# Patient Record
Sex: Female | Born: 1965
Health system: Southern US, Community
[De-identification: ages and names within clinical notes are randomized; demographics above are authoritative.]

## PROBLEM LIST (undated history)

## (undated) DIAGNOSIS — T8859XA Other complications of anesthesia, initial encounter: Secondary | ICD-10-CM

## (undated) DIAGNOSIS — G8921 Chronic pain due to trauma: Secondary | ICD-10-CM

## (undated) DIAGNOSIS — T4145XA Adverse effect of unspecified anesthetic, initial encounter: Secondary | ICD-10-CM

## (undated) DIAGNOSIS — R7303 Prediabetes: Secondary | ICD-10-CM

## (undated) DIAGNOSIS — I10 Essential (primary) hypertension: Secondary | ICD-10-CM

## (undated) DIAGNOSIS — R1013 Epigastric pain: Secondary | ICD-10-CM

## (undated) DIAGNOSIS — F5104 Psychophysiologic insomnia: Secondary | ICD-10-CM

## (undated) DIAGNOSIS — R112 Nausea with vomiting, unspecified: Secondary | ICD-10-CM

## (undated) DIAGNOSIS — G4730 Sleep apnea, unspecified: Secondary | ICD-10-CM

## (undated) DIAGNOSIS — E039 Hypothyroidism, unspecified: Secondary | ICD-10-CM

## (undated) DIAGNOSIS — N3000 Acute cystitis without hematuria: Secondary | ICD-10-CM

## (undated) DIAGNOSIS — Z87442 Personal history of urinary calculi: Secondary | ICD-10-CM

## (undated) DIAGNOSIS — J45909 Unspecified asthma, uncomplicated: Secondary | ICD-10-CM

## (undated) DIAGNOSIS — S0990XA Unspecified injury of head, initial encounter: Secondary | ICD-10-CM

## (undated) DIAGNOSIS — Z9889 Other specified postprocedural states: Secondary | ICD-10-CM

## (undated) DIAGNOSIS — Z8041 Family history of malignant neoplasm of ovary: Secondary | ICD-10-CM

## (undated) HISTORY — DX: Epigastric pain: R10.13

## (undated) HISTORY — PX: TONSILLECTOMY: SUR1361

## (undated) HISTORY — DX: Unspecified injury of head, initial encounter: S09.90XA

## (undated) HISTORY — PX: EYE SURGERY: SHX253

## (undated) HISTORY — DX: Chronic pain due to trauma: G89.21

## (undated) HISTORY — DX: Acute cystitis without hematuria: N30.00

## (undated) HISTORY — PX: INCONTINENCE SURGERY: SHX676

## (undated) HISTORY — PX: PILONIDAL CYST EXCISION: SHX744

## (undated) HISTORY — PX: WISDOM TOOTH EXTRACTION: SHX21

## (undated) HISTORY — PX: OTHER SURGICAL HISTORY: SHX169

## (undated) HISTORY — DX: Unspecified asthma, uncomplicated: J45.909

## (undated) HISTORY — PX: APPENDECTOMY: SHX54

## (undated) HISTORY — DX: Psychophysiologic insomnia: F51.04

## (undated) HISTORY — DX: Family history of malignant neoplasm of ovary: Z80.41

## (undated) HISTORY — PX: PATELLA FRACTURE SURGERY: SHX735

---

## 1997-09-20 ENCOUNTER — Emergency Department (HOSPITAL_COMMUNITY): Admission: EM | Admit: 1997-09-20 | Discharge: 1997-09-20 | Payer: Self-pay

## 1997-09-22 ENCOUNTER — Emergency Department (HOSPITAL_COMMUNITY): Admission: EM | Admit: 1997-09-22 | Discharge: 1997-09-22 | Payer: Self-pay | Admitting: Emergency Medicine

## 1998-07-06 ENCOUNTER — Other Ambulatory Visit: Admission: RE | Admit: 1998-07-06 | Discharge: 1998-07-06 | Payer: Self-pay | Admitting: Obstetrics and Gynecology

## 2001-02-07 DIAGNOSIS — G8921 Chronic pain due to trauma: Secondary | ICD-10-CM

## 2001-02-07 HISTORY — DX: Chronic pain due to trauma: G89.21

## 2001-02-24 ENCOUNTER — Encounter: Payer: Self-pay | Admitting: Emergency Medicine

## 2001-02-24 ENCOUNTER — Emergency Department (HOSPITAL_COMMUNITY): Admission: EM | Admit: 2001-02-24 | Discharge: 2001-02-24 | Payer: Self-pay | Admitting: Emergency Medicine

## 2001-02-24 DIAGNOSIS — S0990XA Unspecified injury of head, initial encounter: Secondary | ICD-10-CM

## 2001-02-24 HISTORY — DX: Unspecified injury of head, initial encounter: S09.90XA

## 2001-08-25 ENCOUNTER — Emergency Department (HOSPITAL_COMMUNITY): Admission: EM | Admit: 2001-08-25 | Discharge: 2001-08-26 | Payer: Self-pay | Admitting: Emergency Medicine

## 2001-09-09 ENCOUNTER — Other Ambulatory Visit: Admission: RE | Admit: 2001-09-09 | Discharge: 2001-09-09 | Payer: Self-pay | Admitting: Obstetrics and Gynecology

## 2001-10-27 ENCOUNTER — Emergency Department (HOSPITAL_COMMUNITY): Admission: EM | Admit: 2001-10-27 | Discharge: 2001-10-27 | Payer: Self-pay | Admitting: Emergency Medicine

## 2001-10-27 ENCOUNTER — Encounter: Payer: Self-pay | Admitting: Emergency Medicine

## 2002-02-22 ENCOUNTER — Emergency Department (HOSPITAL_COMMUNITY): Admission: EM | Admit: 2002-02-22 | Discharge: 2002-02-22 | Payer: Self-pay | Admitting: Emergency Medicine

## 2002-02-22 ENCOUNTER — Encounter: Payer: Self-pay | Admitting: Emergency Medicine

## 2003-07-19 ENCOUNTER — Emergency Department (HOSPITAL_COMMUNITY): Admission: EM | Admit: 2003-07-19 | Discharge: 2003-07-19 | Payer: Self-pay | Admitting: Emergency Medicine

## 2004-04-28 ENCOUNTER — Inpatient Hospital Stay (HOSPITAL_COMMUNITY): Admission: EM | Admit: 2004-04-28 | Discharge: 2004-05-04 | Payer: Self-pay | Admitting: Emergency Medicine

## 2004-05-26 ENCOUNTER — Ambulatory Visit: Payer: Self-pay | Admitting: Internal Medicine

## 2004-05-30 ENCOUNTER — Ambulatory Visit: Payer: Self-pay | Admitting: Internal Medicine

## 2004-06-19 ENCOUNTER — Ambulatory Visit: Payer: Self-pay | Admitting: Internal Medicine

## 2004-06-19 ENCOUNTER — Ambulatory Visit (HOSPITAL_BASED_OUTPATIENT_CLINIC_OR_DEPARTMENT_OTHER): Admission: RE | Admit: 2004-06-19 | Discharge: 2004-06-19 | Payer: Self-pay

## 2005-03-20 ENCOUNTER — Ambulatory Visit: Payer: Self-pay | Admitting: Internal Medicine

## 2005-04-12 ENCOUNTER — Ambulatory Visit: Payer: Self-pay | Admitting: Internal Medicine

## 2005-08-01 ENCOUNTER — Ambulatory Visit: Payer: Self-pay | Admitting: Internal Medicine

## 2005-08-08 ENCOUNTER — Ambulatory Visit: Payer: Self-pay | Admitting: Internal Medicine

## 2005-08-24 ENCOUNTER — Ambulatory Visit: Payer: Self-pay | Admitting: Internal Medicine

## 2005-12-19 ENCOUNTER — Ambulatory Visit: Payer: Self-pay | Admitting: Internal Medicine

## 2006-03-26 ENCOUNTER — Ambulatory Visit: Payer: Self-pay | Admitting: Internal Medicine

## 2006-04-15 ENCOUNTER — Ambulatory Visit: Payer: Self-pay | Admitting: Internal Medicine

## 2006-05-08 ENCOUNTER — Ambulatory Visit (HOSPITAL_COMMUNITY): Admission: RE | Admit: 2006-05-08 | Discharge: 2006-05-08 | Payer: Self-pay | Admitting: Obstetrics and Gynecology

## 2006-06-13 ENCOUNTER — Ambulatory Visit (HOSPITAL_COMMUNITY): Admission: RE | Admit: 2006-06-13 | Discharge: 2006-06-13 | Payer: Self-pay | Admitting: Obstetrics and Gynecology

## 2006-07-13 ENCOUNTER — Ambulatory Visit: Payer: Self-pay | Admitting: Internal Medicine

## 2006-11-01 DIAGNOSIS — S0990XA Unspecified injury of head, initial encounter: Secondary | ICD-10-CM | POA: Insufficient documentation

## 2006-11-01 DIAGNOSIS — J309 Allergic rhinitis, unspecified: Secondary | ICD-10-CM | POA: Insufficient documentation

## 2006-11-01 DIAGNOSIS — R1013 Epigastric pain: Secondary | ICD-10-CM

## 2006-11-01 DIAGNOSIS — K3189 Other diseases of stomach and duodenum: Secondary | ICD-10-CM | POA: Insufficient documentation

## 2006-11-01 DIAGNOSIS — G4700 Insomnia, unspecified: Secondary | ICD-10-CM | POA: Insufficient documentation

## 2006-11-12 ENCOUNTER — Ambulatory Visit: Payer: Self-pay | Admitting: Internal Medicine

## 2006-11-14 ENCOUNTER — Ambulatory Visit: Payer: Self-pay | Admitting: Internal Medicine

## 2006-11-18 ENCOUNTER — Ambulatory Visit: Payer: Self-pay | Admitting: Internal Medicine

## 2006-11-21 ENCOUNTER — Ambulatory Visit: Payer: Self-pay | Admitting: Internal Medicine

## 2006-11-29 ENCOUNTER — Ambulatory Visit: Payer: Self-pay | Admitting: Internal Medicine

## 2006-12-26 ENCOUNTER — Ambulatory Visit: Payer: Self-pay | Admitting: Internal Medicine

## 2006-12-31 ENCOUNTER — Encounter: Payer: Self-pay | Admitting: Internal Medicine

## 2006-12-31 ENCOUNTER — Ambulatory Visit: Payer: Self-pay | Admitting: Internal Medicine

## 2006-12-31 LAB — CONVERTED CEMR LAB
Basophils Relative: 0.5 % (ref 0.0–1.0)
Hemoglobin: 12.9 g/dL (ref 12.0–15.0)
Monocytes Relative: 6.7 % (ref 3.0–11.0)
Platelets: 366 10*3/uL (ref 150–400)
RBC: 4.3 M/uL (ref 3.87–5.11)
RDW: 12.5 % (ref 11.5–14.6)

## 2007-01-17 ENCOUNTER — Ambulatory Visit: Payer: Self-pay | Admitting: Internal Medicine

## 2007-01-17 LAB — CONVERTED CEMR LAB
Basophils Relative: 0.9 % (ref 0.0–1.0)
Eosinophils Absolute: 0.2 10*3/uL (ref 0.0–0.6)
Eosinophils Relative: 2.2 % (ref 0.0–5.0)
HCT: 36 % (ref 36.0–46.0)
Hemoglobin: 12.7 g/dL (ref 12.0–15.0)
Lymphocytes Relative: 20.1 % (ref 12.0–46.0)
MCV: 85.2 fL (ref 78.0–100.0)
Neutro Abs: 7.9 10*3/uL — ABNORMAL HIGH (ref 1.4–7.7)
Neutrophils Relative %: 71.9 % (ref 43.0–77.0)
WBC: 10.9 10*3/uL — ABNORMAL HIGH (ref 4.5–10.5)

## 2007-02-21 ENCOUNTER — Ambulatory Visit: Payer: Self-pay | Admitting: Internal Medicine

## 2007-02-21 ENCOUNTER — Telehealth: Payer: Self-pay | Admitting: Internal Medicine

## 2007-02-21 DIAGNOSIS — N3000 Acute cystitis without hematuria: Secondary | ICD-10-CM | POA: Insufficient documentation

## 2007-02-24 ENCOUNTER — Telehealth: Payer: Self-pay | Admitting: Internal Medicine

## 2007-03-15 DIAGNOSIS — J45909 Unspecified asthma, uncomplicated: Secondary | ICD-10-CM | POA: Insufficient documentation

## 2007-03-15 DIAGNOSIS — Z9089 Acquired absence of other organs: Secondary | ICD-10-CM | POA: Insufficient documentation

## 2007-03-15 DIAGNOSIS — G8921 Chronic pain due to trauma: Secondary | ICD-10-CM | POA: Insufficient documentation

## 2007-07-17 ENCOUNTER — Ambulatory Visit: Payer: Self-pay | Admitting: Internal Medicine

## 2007-07-17 DIAGNOSIS — L02818 Cutaneous abscess of other sites: Secondary | ICD-10-CM | POA: Insufficient documentation

## 2007-07-17 DIAGNOSIS — L03818 Cellulitis of other sites: Secondary | ICD-10-CM

## 2007-07-17 DIAGNOSIS — J069 Acute upper respiratory infection, unspecified: Secondary | ICD-10-CM | POA: Insufficient documentation

## 2007-11-17 ENCOUNTER — Ambulatory Visit: Payer: Self-pay | Admitting: Internal Medicine

## 2008-08-17 ENCOUNTER — Telehealth (INDEPENDENT_AMBULATORY_CARE_PROVIDER_SITE_OTHER): Payer: Self-pay | Admitting: *Deleted

## 2008-08-18 ENCOUNTER — Ambulatory Visit: Payer: Self-pay | Admitting: Internal Medicine

## 2008-08-18 DIAGNOSIS — J019 Acute sinusitis, unspecified: Secondary | ICD-10-CM | POA: Insufficient documentation

## 2009-02-25 ENCOUNTER — Ambulatory Visit: Payer: Self-pay | Admitting: Internal Medicine

## 2009-02-25 DIAGNOSIS — R062 Wheezing: Secondary | ICD-10-CM | POA: Insufficient documentation

## 2009-03-10 ENCOUNTER — Ambulatory Visit: Payer: Self-pay | Admitting: Internal Medicine

## 2009-03-10 LAB — CONVERTED CEMR LAB
Blood in Urine, dipstick: NEGATIVE
Free T4: 0.9 ng/dL (ref 0.6–1.6)
Ketones, urine, test strip: NEGATIVE
Protein, U semiquant: NEGATIVE
Specific Gravity, Urine: 1.015
Urobilinogen, UA: 0.2

## 2009-03-12 ENCOUNTER — Encounter: Payer: Self-pay | Admitting: Internal Medicine

## 2009-03-15 ENCOUNTER — Ambulatory Visit: Payer: Self-pay | Admitting: Internal Medicine

## 2009-06-14 ENCOUNTER — Ambulatory Visit: Payer: Self-pay | Admitting: Internal Medicine

## 2009-06-14 DIAGNOSIS — A09 Infectious gastroenteritis and colitis, unspecified: Secondary | ICD-10-CM | POA: Insufficient documentation

## 2009-06-14 DIAGNOSIS — R1084 Generalized abdominal pain: Secondary | ICD-10-CM | POA: Insufficient documentation

## 2009-06-17 ENCOUNTER — Encounter: Payer: Self-pay | Admitting: Internal Medicine

## 2009-06-18 ENCOUNTER — Encounter: Payer: Self-pay | Admitting: Internal Medicine

## 2009-06-21 LAB — CONVERTED CEMR LAB
ALT: 42 units/L — ABNORMAL HIGH (ref 0–35)
Albumin: 3.6 g/dL (ref 3.5–5.2)
BUN: 7 mg/dL (ref 6–23)
Basophils Relative: 0.9 % (ref 0.0–3.0)
Bilirubin Urine: NEGATIVE
Bilirubin, Direct: 0.1 mg/dL (ref 0.0–0.3)
CO2: 28 meq/L (ref 19–32)
Chloride: 105 meq/L (ref 96–112)
Creatinine, Ser: 0.5 mg/dL (ref 0.4–1.2)
Eosinophils Absolute: 0.2 10*3/uL (ref 0.0–0.7)
Eosinophils Relative: 2.9 % (ref 0.0–5.0)
HCT: 36.8 % (ref 36.0–46.0)
Lipase: 16 units/L (ref 11.0–59.0)
Lymphs Abs: 2.4 10*3/uL (ref 0.7–4.0)
MCHC: 33.2 g/dL (ref 30.0–36.0)
MCV: 87.9 fL (ref 78.0–100.0)
Monocytes Absolute: 0.5 10*3/uL (ref 0.1–1.0)
Neutro Abs: 4.3 10*3/uL (ref 1.4–7.7)
Neutrophils Relative %: 57.8 % (ref 43.0–77.0)
Nitrite: POSITIVE
Potassium: 4 meq/L (ref 3.5–5.1)
RBC: 4.18 M/uL (ref 3.87–5.11)
Total Protein, Urine: 30 mg/dL
Total Protein: 7.3 g/dL (ref 6.0–8.3)
Urobilinogen, UA: 0.2 (ref 0.0–1.0)
WBC: 7.5 10*3/uL (ref 4.5–10.5)

## 2009-07-25 ENCOUNTER — Ambulatory Visit: Payer: Self-pay | Admitting: Internal Medicine

## 2009-07-25 LAB — CONVERTED CEMR LAB
Bilirubin Urine: NEGATIVE
Ketones, ur: NEGATIVE mg/dL
Leukocytes, UA: NEGATIVE
Specific Gravity, Urine: 1.015 (ref 1.000–1.030)
Total Protein, Urine: NEGATIVE mg/dL
pH: 6 (ref 5.0–8.0)

## 2009-07-27 ENCOUNTER — Telehealth: Payer: Self-pay | Admitting: Internal Medicine

## 2010-02-01 ENCOUNTER — Ambulatory Visit: Payer: Self-pay | Admitting: Internal Medicine

## 2010-02-01 DIAGNOSIS — T148XXA Other injury of unspecified body region, initial encounter: Secondary | ICD-10-CM | POA: Insufficient documentation

## 2010-02-01 LAB — CONVERTED CEMR LAB
Basophils Absolute: 0.1 10*3/uL (ref 0.0–0.1)
Eosinophils Relative: 3.1 % (ref 0.0–5.0)
Hemoglobin: 12.8 g/dL (ref 12.0–15.0)
Lymphocytes Relative: 28.2 % (ref 12.0–46.0)
Monocytes Relative: 6.7 % (ref 3.0–12.0)
Neutro Abs: 6.5 10*3/uL (ref 1.4–7.7)
Platelets: 350 10*3/uL (ref 150.0–400.0)
RDW: 13.1 % (ref 11.5–14.6)
WBC: 10.7 10*3/uL — ABNORMAL HIGH (ref 4.5–10.5)

## 2010-02-16 ENCOUNTER — Ambulatory Visit: Payer: Self-pay | Admitting: Internal Medicine

## 2010-02-22 ENCOUNTER — Telehealth: Payer: Self-pay | Admitting: Internal Medicine

## 2010-02-22 DIAGNOSIS — R05 Cough: Secondary | ICD-10-CM

## 2010-02-22 DIAGNOSIS — R059 Cough, unspecified: Secondary | ICD-10-CM | POA: Insufficient documentation

## 2010-02-23 ENCOUNTER — Ambulatory Visit: Payer: Self-pay | Admitting: Internal Medicine

## 2010-02-23 LAB — CONVERTED CEMR LAB
Basophils Relative: 0.8 % (ref 0.0–3.0)
Eosinophils Relative: 3.8 % (ref 0.0–5.0)
HCT: 34.7 % — ABNORMAL LOW (ref 36.0–46.0)
Hemoglobin: 12 g/dL (ref 12.0–15.0)
Lymphs Abs: 3.2 10*3/uL (ref 0.7–4.0)
MCV: 87.6 fL (ref 78.0–100.0)
Monocytes Relative: 5.3 % (ref 3.0–12.0)
Neutro Abs: 5.9 10*3/uL (ref 1.4–7.7)
WBC: 10.1 10*3/uL (ref 4.5–10.5)

## 2010-04-30 ENCOUNTER — Encounter: Payer: Self-pay | Admitting: Surgery

## 2010-05-01 ENCOUNTER — Telehealth: Payer: Self-pay | Admitting: Internal Medicine

## 2010-05-03 ENCOUNTER — Ambulatory Visit
Admission: RE | Admit: 2010-05-03 | Discharge: 2010-05-03 | Payer: Self-pay | Source: Home / Self Care | Attending: Internal Medicine | Admitting: Internal Medicine

## 2010-05-03 ENCOUNTER — Telehealth (INDEPENDENT_AMBULATORY_CARE_PROVIDER_SITE_OTHER): Payer: Self-pay | Admitting: *Deleted

## 2010-05-03 LAB — CONVERTED CEMR LAB
Blood in Urine, dipstick: NEGATIVE
Glucose, Urine, Semiquant: NEGATIVE
Ketones, urine, test strip: NEGATIVE
Nitrite: NEGATIVE
Protein, U semiquant: NEGATIVE
WBC Urine, dipstick: NEGATIVE

## 2010-05-05 ENCOUNTER — Ambulatory Visit: Payer: Self-pay | Admitting: Internal Medicine

## 2010-05-09 NOTE — Assessment & Plan Note (Signed)
Summary: DR MEN PT--GAS PAIN-DIARRHEA  STC   Vital Signs:  Patient profile:   45 year old female Height:      68 inches Weight:      347 pounds BMI:     52.95 O2 Sat:      99 % on Room air Temp:     97.5 degrees F oral Pulse rate:   75 / minute BP sitting:   110 / 80  (left arm) Cuff size:   large  Vitals Entered ByZella Ball Ewing (June 14, 2009 3:03 PM)  O2 Flow:  Room air CC: Diarrhea, nausea, refills/RE   Primary Care Provider:  Norins  CC:  Diarrhea, nausea, and refills/RE.  History of Present Illness: here after 10 days onset n/v with watery nonbloody diarrhea  - felt terrible for several days at onset, did ok with phenergan the first 2 days with nausea and immodium for diarrhea, tylenol for aches, then felt improved,  but then 3 to 4 days ago seemed to start all over again with numerous episodes vomiting despite phenergan 50 mg and thinks she kep the pill down;  had fever, sweats, clammy, chills , watery diarrhea but very mild abd pains at worst;  the second "episode" was much worse , took a friend's zofran for the weekend of the second episode; had marked gas from below and some mild stool incontinence ; today seems still abd bloating adn discomfort but no further vomiting, trying gas-x and immodium with less stool incontinence;  also incidently it seems with 3 to 4 days sinus drainage now turning colored greenish  with increasd pain ;  and on top of all that menses started 3 days ago;  husband not ill and stays in the same room;  Pt denies CP, sob, doe, wheezing, orthopnea, pnd, worsening LE edema, palps, dizziness or syncope no rash , joint pains, but did have myalgas.    Problems Prior to Update: 1)  Infectious Diarrhea  (ICD-009.2) 2)  Abdominal Pain, Generalized  (ICD-789.07) 3)  Sinusitis- Acute-nos  (ICD-461.9) 4)  Wheezing  (ICD-786.07) 5)  Sinusitis- Acute-nos  (ICD-461.9) 6)  Cellulitis/abscess, Site Nec  (ICD-682.8) 7)  Uri  (ICD-465.9) 8)  Repair of Torn Ligament  Right Leg.  () 9)  Repair of Toe Laceration  () 10)  Hx of Fracture, Patella Right  (ICD-822.0) 11)  Hx of Fracture, Foot Right  (ICD-825.20) 12)  Appendectomy, Hx of  (ICD-V45.79) 13)  Tonsillectomy, Hx of  (ICD-V45.79) 14)  Hx of Chronic Pain Due To Trauma  (ICD-338.21) 15)  Asthma, Childhood  (ICD-493.00) 16)  Acute Cystitis  (ICD-595.0) 17)  Head Trauma, Closed  (ICD-959.01) 18)  Insomnia, Chronic  (ICD-307.42) 19)  Dyspepsia, Chronic  (ICD-536.8) 20)  Allergic Rhinitis  (ICD-477.9)  Medications Prior to Update: 1)  Diazepam 10 Mg  Tabs (Diazepam) .... As Needed 2)  Nexium 40 Mg  Cpdr (Esomeprazole Magnesium) .... Once Daily As Needed 3)  Meclizine Hcl 25 Mg  Tabs (Meclizine Hcl) .... Q 6 As Needed 4)  Promethazine Hcl 25 Mg  Tabs (Promethazine Hcl) .... Q 6 As Needed 5)  Sudafed 30 Mg  Tabs (Pseudoephedrine Hcl) .... As Directed As Needed 6)  Ibuprofen 200 Mg  Caps (Ibuprofen) .... As Directed As Needed 7)  Furosemide 20 Mg  Tabs (Furosemide) .Marland Kitchen.. 1 By Mouth Once Daily As Needed Fluid Retention. 8)  Lidoderm 5 % Ptch (Lidocaine) .... Prn 9)  Topamax 25 Mg Tabs (Topiramate) .... Ptn 10)  Advair Diskus 250-50 Mcg/dose Aepb (Fluticasone-Salmeterol) .Marland Kitchen.. 1 Puff Once Daily 11)  Proventil Hfa 108 (90 Base) Mcg/act Aers (Albuterol Sulfate) .... 2 Puffs Four Times Per Day As Needed 12)  Nasonex 50 Mcg/act Susp (Mometasone Furoate) .... 2 Sprays To Each Nostril Once A Day. 13)  Fluconazole 150 Mg Tabs (Fluconazole) .Marland Kitchen.. 1 By Mouth X 1 For Yeast Infection Post Antibiotics  Current Medications (verified): 1)  Diazepam 10 Mg  Tabs (Diazepam) .... As Needed 2)  Nexium 40 Mg  Cpdr (Esomeprazole Magnesium) .... Once Daily As Needed 3)  Meclizine Hcl 25 Mg  Tabs (Meclizine Hcl) .... Q 6 As Needed 4)  Promethazine Hcl 25 Mg  Tabs (Promethazine Hcl) .... Q 6 As Needed 5)  Sudafed 30 Mg  Tabs (Pseudoephedrine Hcl) .... As Directed As Needed 6)  Ibuprofen 200 Mg  Caps (Ibuprofen) .... As Directed  As Needed 7)  Furosemide 20 Mg  Tabs (Furosemide) .Marland Kitchen.. 1 By Mouth Once Daily As Needed Fluid Retention. 8)  Lidoderm 5 % Ptch (Lidocaine) .... Prn 9)  Topamax 25 Mg Tabs (Topiramate) .... Ptn 10)  Advair Diskus 250-50 Mcg/dose Aepb (Fluticasone-Salmeterol) .Marland Kitchen.. 1 Puff Once Daily 11)  Proventil Hfa 108 (90 Base) Mcg/act Aers (Albuterol Sulfate) .... 2 Puffs Four Times Per Day As Needed 12)  Nasonex 50 Mcg/act Susp (Mometasone Furoate) .... 2 Sprays To Each Nostril Once A Day. 13)  Metronidazole 500 Mg Tabs (Metronidazole) .Marland Kitchen.. 1 By Mouth Qid For 7 Days 14)  Azithromycin 250 Mg Tabs (Azithromycin) .... 2po Qd For 1 Day, Then 1po Qd For 4days, Then Stop  Allergies (verified): 1)  ! * Avelox 2)  ! Codeine 3)  ! Percocet 4)  ! Darvocet 5)  Compazine 6)  * Trileptal  Past History:  Past Medical History: Last updated: 03/15/2007 Hx of CHRONIC PAIN DUE TO TRAUMA (ICD-338.21) ASTHMA, CHILDHOOD (ICD-493.00) ACUTE CYSTITIS (ICD-595.0) HEAD TRAUMA, CLOSED (ICD-959.01) INSOMNIA, CHRONIC (ICD-307.42) DYSPEPSIA, CHRONIC (ICD-536.8) ALLERGIC RHINITIS (ICD-477.9)    Past Surgical History: Last updated: 03/15/2007 * REPAIR OF TORN LIGAMENT RIGHT LEG. 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  Risk Factors: Smoking Status: never (11/01/2006)  Review of Systems       all otherwise negative per pt -    Physical Exam  General:  alert and overweight-appearing.  ,mild ill  Head:  normocephalic and atraumatic.   Eyes:  vision grossly intact, pupils equal, and pupils round.   Ears:  bilat tm's mild red, sinus tender Nose:  nasal dischargemucosal  pallor and mucosal edema.   Mouth:  pharyngeal erythema and fair dentition.   Neck:  supple and no masses.  , no LA Lungs:  normal respiratory effort and normal breath sounds.   Heart:  normal rate and regular rhythm.   Abdomen:  soft, normal bowel sounds, no distention, no masses, and no guarding.  , but with diffuse mild tender some worse to the left upper adn lower quads, no rebound Msk:  no joint tenderness and no joint swelling.   Extremities:  no edema, no erythema  Skin:  color normal and no rashes.   Psych:  normally interactive, not depressed appearing, and severely anxious.  Impression & Recommendations:  Problem # 1:  SINUSITIS- ACUTE-NOS (ICD-461.9)  Her updated medication list for this problem includes:    Sudafed 30 Mg Tabs (Pseudoephedrine hcl) .Marland Kitchen... As directed as needed    Nasonex 50 Mcg/act Susp (Mometasone furoate) .Marland Kitchen... 2 sprays to each nostril once a day.    Metronidazole 500 Mg Tabs (Metronidazole) .Marland Kitchen... 1 by mouth qid for 7 days    Azithromycin 250 Mg Tabs (Azithromycin) .Marland Kitchen... 2po qd for 1 day, then 1po qd for 4days, then stop seems onset in last 3 to4  days, treat as above, f/u any worsening signs or symptoms   Problem # 2:  ABDOMINAL PAIN, GENERALIZED (ICD-789.07)  more left sided - I believe prob related to original illness from approx 10 days ago, ? viral but cannot r/o other colitis -to check labs  Orders: TLB-BMP (Basic Metabolic Panel-BMET) (80048-METABOL) TLB-CBC Platelet - w/Differential (85025-CBCD) TLB-Hepatic/Liver Function Pnl (80076-HEPATIC) TLB-Lipase (83690-LIPASE) TLB-Udip w/ Micro (81001-URINE) T-Culture, Urine ([address])  Problem # 3:  INFECTIOUS DIARRHEA (ICD-009.2)  presumed - likely related to above, to check labs  Orders: T-Culture, Stool ([id number]) T-Culture, C-Diff Toxin A/B (098[address]-91478) T-Stool Giardia / Crypto- EIA (29562) T-Stool for O&P (130[address]-57846)  Complete Medication List: 1)  Diazepam 10 Mg Tabs  (Diazepam) .... As needed 2)  Nexium 40 Mg Cpdr (Esomeprazole magnesium) .... Once daily as needed 3)  Meclizine Hcl 25 Mg Tabs (Meclizine hcl) .... Q 6 as needed 4)  Promethazine Hcl 25 Mg Tabs (Promethazine hcl) .... Q 6 as needed 5)  Sudafed 30 Mg Tabs (Pseudoephedrine hcl) .... As directed as needed 6)  Ibuprofen 200 Mg Caps (Ibuprofen) .... As directed as needed 7)  Furosemide 20 Mg Tabs (Furosemide) .Marland Kitchen.. 1 by mouth once daily as needed fluid retention. 8)  Lidoderm 5 % Ptch (Lidocaine) .... Prn 9)  Topamax 25 Mg Tabs (Topiramate) .... Ptn 10)  Advair Diskus 250-50 Mcg/dose Aepb (Fluticasone-salmeterol) .Marland Kitchen.. 1 puff once daily 11)  Proventil Hfa 108 (90 Base) Mcg/act Aers (Albuterol sulfate) .... 2 puffs four times per day as needed 12)  Nasonex 50 Mcg/act Susp (Mometasone furoate) .... 2 sprays to each nostril once a day. 13)  Metronidazole 500 Mg Tabs (Metronidazole) .Marland Kitchen.. 1 by mouth qid for 7 days 14)  Azithromycin 250 Mg Tabs (Azithromycin) .... 2po qd for 1 day, then 1po qd for 4days, then stop  Patient Instructions: 1)  Please take all new medications as prescribed 2)  Continue all previous medications as before this visit  3)  Please go to the Lab in the basement for your blood and/or urine tests today , and stool tests 4)  Please schedule an appointment with your primary doctor as needed Prescriptions: NASONEX 50 MCG/ACT SUSP (MOMETASONE FUROATE) 2 sprays to each nostril once a day.  #1 x 11   Entered and Authorized by:   Corwin Levins MD   Signed by:   Corwin Levins MD on 06/14/2009   Method used:   Electronically to        [facility] (retail)       [address]       [place], [place]  [address]       Ph: [phone]       Fax: [phone]   RxID:   [id number] METRONIDAZOLE 500 MG TABS (METRONIDAZOLE) 1 by mouth qid for 7 days  #28 x 0   Entered and Authorized by:  Corwin Levins MD   Signed by:   Corwin Levins MD on 06/14/2009   Method  used:   Print then Give to Patient   RxID:   [id number] AZITHROMYCIN 250 MG TABS (AZITHROMYCIN) 2po qd for 1 day, then 1po qd for 4days, then stop  #6 x 1   Entered and Authorized by:   Corwin Levins MD   Signed by:   Corwin Levins MD on 06/14/2009   Method used:   Print then Give to Patient   RxID:   [id number]

## 2010-05-09 NOTE — Progress Notes (Signed)
Summary: REQ FOR RX  Phone Note Call from Patient Call back at [facility] Phone [phone]   Summary of Call: PT is concerned that she needs another different antibiotic. She continues to c/o productive cough w/ dark yellow mucus and low grade fever. Also c/o vaginal yeast infection & white spots in her mouth.  Initial call taken by: Lamar Sprinkles, CMA,  February 22, 2010 11:09 AM  Follow-up for Phone Call        If she is failing augmentin she will need CBCD, PA&Lat CXR with ov to follow.   For vaginitis and possible thrush - fluconazole 100mg  once daily x 10 days. #10   Follow-up by: Jacques Navy MD,  February 22, 2010 1:12 PM  Additional Follow-up for Phone Call Additional follow up Details #1::        Pt informed, orders in idx & EMR, pt scheduled for tomorrow pm Additional Follow-up by: Lamar Sprinkles, CMA,  February 22, 2010 2:26 PM  New Problems: COUGH (ICD-786.2)   New Problems: COUGH (ICD-786.2) New/Updated Medications: FLUCONAZOLE 100 MG TABS (FLUCONAZOLE) 1 once daily x 10 days Prescriptions: FLUCONAZOLE 100 MG TABS (FLUCONAZOLE) 1 once daily x 10 days  #10 x 0   Entered by:   Lamar Sprinkles, CMA   Authorized by:   Jacques Navy MD   Signed by:   Lamar Sprinkles, CMA on 02/22/2010   Method used:   Electronically to        [facility] (retail)       [address]       [place], [place]  [address]       Ph: [phone]       Fax: [phone]   RxID:   [id number]

## 2010-05-09 NOTE — Progress Notes (Signed)
Summary: urine  Phone Note Outgoing Call   Reason for Call: Discuss lab or test results Summary of Call: please call pt - U/A negative Thanks Initial call taken by: Jacques Navy MD,  July 27, 2009 5:38 AM  Follow-up for Phone Call        called pt lmoam for pt to call back Follow-up by: Ami Bullins CMA,  July 27, 2009 11:08 AM  Additional Follow-up for Phone Call Additional follow up Details #1::        Called pt again no ansew LMOM (personal VM) UA was negative. Additional Follow-up by: Orlan Leavens,  July 28, 2009 3:21 PM

## 2010-05-09 NOTE — Assessment & Plan Note (Signed)
Summary: SINUS PROBLEM--STC   Vital Signs:  Patient profile:   45 year old female Height:      68 inches Weight:      338 pounds BMI:     51.58 O2 Sat:      99 % on Room air Temp:     98.4 degrees F oral Pulse rate:   92 / minute BP sitting:   128 / 86  (left arm) Cuff size:   large  Vitals Entered By: Bill Salinas CMA (February 01, 2010 9:05 AM)  O2 Flow:  Room air CC: pt here with c/o sinus', coughing, cold chills fever and allergies x 4 days/ ab   Primary Care Kalep Full:  Norins  CC:  pt here with c/o sinus', coughing, and cold chills fever and allergies x 4 days/ ab.  History of Present Illness: [name] presents today with a 6 day history of sinus congestion.  She noticed that she began to feel congested on Friday afternoon and then shorthly thereafter began to get intermittent chills and fevers.  Along with the feelings of fevers she noticed a throbbing headache in the frontal and temporal regions.  She states that this headache does not resemble the normal sinus pressure headaches that she often gets with her sinus infections - she has had 3 sinus infections this year.  The pain she feels comes and goes with the fevers and taking ibuprofen 400-600mg  will help relieve the pain until the next time she feels flush and the pain will again return.  She also has been taking a Benadryl and a decongestant to help with the congestion and runny nose every 4 hours.  She does think they have been helping "dry her out" but has noticed an increase in her blood pressure when she takes them.  She also reports intermittent bleeding from her right nose as well as blood stained mucus production when she blows her nose.  The mucus she is producting is white tinted with occasional blood.  Last night she did develop a cough with yellow/green sputum but no blood.  She also states that she has been feeling tired and having muscle soreness for the past couple days.  She also has noticed that she has been  bruising more easily than normal.        Current Medications (verified): 1)  Diazepam 10 Mg  Tabs (Diazepam) .... As Needed 2)  Nexium 40 Mg  Cpdr (Esomeprazole Magnesium) .... Once Daily As Needed 3)  Meclizine Hcl 25 Mg  Tabs (Meclizine Hcl) .... Q 6 As Needed 4)  Promethazine Hcl 25 Mg  Tabs (Promethazine Hcl) .... Q 6 As Needed 5)  Sudafed 30 Mg  Tabs (Pseudoephedrine Hcl) .... As Directed As Needed 6)  Ibuprofen 200 Mg  Caps (Ibuprofen) .... As Directed As Needed 7)  Furosemide 20 Mg  Tabs (Furosemide) .Marland Kitchen.. 1 By Mouth Once Daily As Needed Fluid Retention. 8)  Lidoderm 5 % Ptch (Lidocaine) .... Prn 9)  Topamax 25 Mg Tabs (Topiramate) .... Ptn 10)  Advair Diskus 250-50 Mcg/dose Aepb (Fluticasone-Salmeterol) .Marland Kitchen.. 1 Puff Once Daily 11)  Proventil Hfa 108 (90 Base) Mcg/act Aers (Albuterol Sulfate) .... 2 Puffs Four Times Per Day As Needed  Allergies (verified): 1)  ! * Avelox 2)  ! Codeine 3)  ! Percocet 4)  ! Darvocet 5)  Compazine 6)  * Trileptal  Past History:  Past Medical History: Last updated: 03/15/2007 Hx of CHRONIC PAIN DUE TO TRAUMA (ICD-338.21) ASTHMA, CHILDHOOD (ICD-493.00)  ACUTE CYSTITIS (ICD-595.0) HEAD TRAUMA, CLOSED (ICD-959.01) INSOMNIA, CHRONIC (ICD-307.42) DYSPEPSIA, CHRONIC (ICD-536.8) ALLERGIC RHINITIS (ICD-477.9)    Past Surgical History: Last updated: 03/15/2007 * REPAIR OF TORN LIGAMENT RIGHT LEG. 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  Risk Factors: Smoking Status: never (11/01/2006)  Review of Systems       The patient complains of anorexia, fever, dyspnea on exertion, prolonged cough, and headaches.  The patient denies weight loss, weight gain, vision loss, decreased hearing, hoarseness, chest pain, syncope, peripheral edema, hemoptysis, abdominal pain, melena, hematochezia, hematuria, and enlarged lymph nodes.    Physical Exam  General:  alert, well-developed, and well-nourished.   Head:  normocephalic and atraumatic.   Eyes:  pupils equal, pupils round, and pupils reactive to light.  C&S clear Ears:  R ear normal.  L ear had less movement than the R ear with insufflation. Nose:  no external erythema, no nasal discharge, active bleeding or clots, R frontal sinus tenderness, and R maxillary sinus tenderness.  Right frontal and maxillary sinus with decreased transillumination. Mouth:  good dentition, no exudates, and pharyngeal erythema.   Neck:  supple and no cervical lymphadenopathy.   Lungs:  normal respiratory effort, no crackles, and no wheezes.   Heart:  normal rate, regular rhythm, no murmur, no gallop, and no rub.   Msk:  Muscle soreness to palpitation. Pulses:  R radial normal, R posterior tibial normal, L radial normal, and L posterior tibial normal.   Neurologic:  alert & oriented X3 and cranial nerves II-XII intact.   Cervical Nodes:  no anterior cervical adenopathy and no posterior cervical adenopathy.     Impression & Recommendations:  Problem # 1:  SINUSITIS- ACUTE-NOS (ICD-461.9) Due to her current afebrile state and clear mucous production it appears as though she does not have a sinus infection but possibly a viral infection.  However with her tender sinuses and productive cough a bacterial infection is still a possibility and antibiotics would be a reasonable option for this patient.    Plan:  Z-Pack for the potential infection           Continue decongestant use but only 2-3 times a  day           D/C antihistamine           Continue nasal spray for lubrication of the nasal passages           Increase fluid intake   The following medications were removed from the medication list:    Fluticasone Propionate 50 Mcg/act Susp (Fluticasone propionate) .Marland Kitchen... 2 spray/side once daily Her updated medication list for this problem includes:    Sudafed 30 Mg Tabs (Pseudoephedrine hcl) .Marland Kitchen... As directed as needed    Zithromax Z-pak 250 Mg Tabs (Azithromycin) .Marland Kitchen... As directed    Fluticasone Propionate 50 Mcg/act Susp (Fluticasone propionate) .Marland Kitchen... 1 spray per nostril as needed  Orders: TLB-CBC Platelet - w/Differential (85025-CBCD)  Problem # 2:  BRUISE (ICD-924.9) The patient reported easy bruising which is new for her.  The increased  bruising does not seem to be explained by her current sinus problem and should be worked up to rule out a platelet disorder.   Plan:  CBC draw today   Complete Medication List: 1)  Diazepam 10 Mg Tabs (Diazepam) .... As needed 2)  Nexium 40 Mg Cpdr (Esomeprazole magnesium) .... Once daily as needed 3)  Meclizine Hcl 25 Mg Tabs (Meclizine hcl) .... Q 6 as needed 4)  Promethazine Hcl 25 Mg Tabs (Promethazine hcl) .... Q 6 as needed 5)  Sudafed 30 Mg Tabs (Pseudoephedrine hcl) .... As directed as needed 6)  Ibuprofen 200 Mg Caps (Ibuprofen) .... As directed as needed 7)  Furosemide 20 Mg Tabs (Furosemide) .Marland Kitchen.. 1 by mouth once daily as needed fluid retention. 8)  Lidoderm 5 % Ptch (Lidocaine) .... Prn 9)  Topamax 25 Mg Tabs (Topiramate) .... Ptn 10)  Advair Diskus 250-50 Mcg/dose Aepb (Fluticasone-salmeterol) .Marland Kitchen.. 1 puff once daily 11)  Proventil Hfa 108 (90 Base) Mcg/act Aers (Albuterol sulfate) .... 2 puffs four times per day as needed 12)  Zithromax Z-pak 250 Mg Tabs (Azithromycin) .... As directed 13)  Fluticasone Propionate 50 Mcg/act Susp (Fluticasone propionate) .Marland Kitchen.. 1 spray per nostril as needed Prescriptions: FLUTICASONE PROPIONATE 50  MCG/ACT SUSP (FLUTICASONE PROPIONATE) 1 spray per nostril as needed  #1 x 12   Entered and Authorized by:   Jacques Navy MD   Signed by:   Jacques Navy MD on 02/02/2010   Method used:   Electronically to        [facility] (retail)       [address]       [place], [place]  [address]       Ph: [phone]       Fax: [phone]   RxID:   [id number] ZITHROMAX Z-PAK 250 MG TABS (AZITHROMYCIN) as directed  #1 x 0   Entered and Authorized by:   Jacques Navy MD   Signed by:   Jacques Navy MD on 02/01/2010   Method used:   Electronically to        [facility] (retail)       [address]       [place], [place]  [address]       Ph: [phone]       Fax: [phone]   RxID:   [id number]    Orders Added: 1)  TLB-CBC Platelet - w/Differential [85025-CBCD] 2)  Est. Patient Level III [88416]

## 2010-05-09 NOTE — Assessment & Plan Note (Signed)
Summary: COUGH W/MUCUS  STC   Vital Signs:  Patient profile:   45 year old female Height:      68 inches Weight:      345 pounds BMI:     52.65 O2 Sat:      99 % on Room air Temp:     98.6 degrees F oral Pulse rate:   94 / minute BP sitting:   128 / 90  (left arm) Cuff size:   large  Vitals Entered By: Bill Salinas CMA (February 16, 2010 9:29 AM)  O2 Flow:  Room air CC: Pt was seen here on 02/01/2010 with a viral infection-clear mucous production and question of bacterial infection due to tender sinuses and productive cough, She was started on a Z-pak for these symptoms and states since her symptoms have gotten worse with green mucous production she also had c/o feeling "winded" and wheezing/ ab Comments z-pak regimen completed/ ab   Primary Care Provider:  Lin Hackmann  CC:  Pt was seen here on 02/01/2010 with a viral infection-clear mucous production and question of bacterial infection due to tender sinuses and productive cough and She was started on a Z-pak for these symptoms and states since her symptoms have gotten worse with green mucous production she also had c/o feeling "winded" and wheezing/ ab.  History of Present Illness: Patinet was seen Oct 26th for an URI and was treated with a Z-pak. She has now gotten worse with productive cough, fatigue,SOB, wheezing, fever and fatigue. She has been taking prom/cod but this is not really controlling her cough. She is taking a decongestant and the nasal saline saline spray.  Current Medications (verified): 1)  Diazepam 10 Mg  Tabs (Diazepam) .... As Needed 2)  Nexium 40 Mg  Cpdr (Esomeprazole Magnesium) .... Once Daily As Needed 3)  Meclizine Hcl 25 Mg  Tabs (Meclizine Hcl) .... Q 6 As Needed 4)  Promethazine Hcl 25 Mg  Tabs (Promethazine Hcl) .... Q 6 As Needed 5)  Sudafed 30 Mg  Tabs (Pseudoephedrine Hcl) .... As Directed As Needed 6)  Ibuprofen 200 Mg  Caps (Ibuprofen) .... As Directed As Needed 7)  Furosemide 20 Mg  Tabs (Furosemide)  .Marland Kitchen.. 1 By Mouth Once Daily As Needed Fluid Retention. 8)  Lidoderm 5 % Ptch (Lidocaine) .... Prn 9)  Topamax 25 Mg Tabs (Topiramate) .... Ptn 10)  Advair Diskus 250-50 Mcg/dose Aepb (Fluticasone-Salmeterol) .Marland Kitchen.. 1 Puff Once Daily 11)  Proventil Hfa 108 (90 Base) Mcg/act Aers (Albuterol Sulfate) .... 2 Puffs Four Times Per Day As Needed 12)  Fluticasone Propionate 50 Mcg/act Susp (Fluticasone Propionate) .Marland Kitchen.. 1 Spray Per Nostril As Needed  Allergies (verified): 1)  ! * Avelox 2)  ! Codeine 3)  ! Percocet 4)  ! Darvocet 5)  Compazine 6)  * Trileptal  Past History:  Past Medical History: Last updated: 03/15/2007 Hx of CHRONIC PAIN DUE TO TRAUMA (ICD-338.21) ASTHMA, CHILDHOOD (ICD-493.00) ACUTE CYSTITIS (ICD-595.0) HEAD TRAUMA, CLOSED (ICD-959.01) INSOMNIA, CHRONIC (ICD-307.42) DYSPEPSIA, CHRONIC (ICD-536.8) ALLERGIC RHINITIS (ICD-477.9)    Past Surgical History: Last updated: 03/15/2007 * REPAIR OF TORN LIGAMENT RIGHT LEG. * REPAIR OF TOE LACERATION Hx of FRACTURE, PATELLA RIGHT (ICD-822.0) Hx of FRACTURE, FOOT RIGHT (ICD-825.20) APPENDECTOMY, HX OF (ICD-V45.79) TONSILLECTOMY, HX OF (ICD-V45.79) FH reviewed for relevance, SH/Risk Factors reviewed for relevance  Review of Systems       The patient complains of fever, hoarseness, dyspnea on exertion, and prolonged cough.  The patient denies anorexia, weight loss, weight gain, decreased  hearing, syncope, headaches, abdominal pain, hematochezia, incontinence, suspicious skin lesions, difficulty walking, unusual weight change, abnormal bleeding, and angioedema.    Physical Exam  General:  alert, well-developed, and well-nourished.   Head:  normocephalic and atraumatic.  Tender to percussion over left frontal and masxillary sinus Eyes:  vision grossly intact, pupils equal, and pupils round.   Ears:  R ear normal and L ear normal.   Nose:  no external deformity and nose piercing noted.   Mouth:  throat clear Neck:  supple and  full ROM.  tender to palpation right neck Chest Wall:  no deformities and no tenderness.   Lungs:  normal respiratory effort, no intercostal retractions, no accessory muscle use, normal breath sounds, no crackles, and no wheezes.   Heart:  normal rate, regular rhythm, and no JVD.   Skin:  turgor normal and color normal.  No urticaria Cervical Nodes:  no anterior cervical adenopathy and no posterior cervical adenopathy.   Psych:  Oriented X3, memory intact for recent and remote, normally interactive, and good eye contact.     Impression & Recommendations:  Problem # 1:  URI (ICD-465.9) Patient with low grade fever, sinus pain and persistent cough. ON exam no wheezing.  Plan - tussionx 1 tsp q 12 (discussed with patient who has listed a percocet allergy with nausea and rash. She is willing to try this and will continue taking zyrtec. she has phenergan at home.            Augmentin 875 two times a day x 7           continue all other meds           mucinex.           Her updated medication list for this problem includes:    Promethazine Hcl 25 Mg Tabs (Promethazine hcl) ..... Q 6 as needed    Ibuprofen 200 Mg Caps (Ibuprofen) .Marland Kitchen... As directed as needed    Tussionex Pennkinetic Er 10-8 Mg/28ml Lqcr (Hydrocod polst-chlorphen polst) .Marland Kitchen... 1 tsp q 12  Complete Medication List: 1)  Diazepam 10 Mg Tabs (Diazepam) .... As needed 2)  Nexium 40 Mg Cpdr (Esomeprazole magnesium) .... Once daily as needed 3)  Meclizine Hcl 25 Mg Tabs (Meclizine hcl) .... Q 6 as needed 4)  Promethazine Hcl 25 Mg Tabs (Promethazine hcl) .... Q 6 as needed 5)  Sudafed 30 Mg Tabs (Pseudoephedrine hcl) .... As directed as needed 6)  Ibuprofen 200 Mg Caps (Ibuprofen) .... As directed as needed 7)  Furosemide 20 Mg Tabs (Furosemide) .Marland Kitchen.. 1 by mouth once daily as needed fluid retention. 8)  Lidoderm 5 % Ptch (Lidocaine) .... Prn 9)  Topamax 25 Mg Tabs (Topiramate) .... Ptn 10)  Advair Diskus 250-50 Mcg/dose Aepb  (Fluticasone-salmeterol) .Marland Kitchen.. 1 puff once daily 11)  Proventil Hfa 108 (90 Base) Mcg/act Aers (Albuterol sulfate) .... 2 puffs four times per day as needed 12)  Fluticasone Propionate 50 Mcg/act Susp (Fluticasone propionate) .Marland Kitchen.. 1 spray per nostril as needed 13)  Augmentin 875-125 Mg Tabs (Amoxicillin-pot clavulanate) .Marland Kitchen.. 1 by mouth two times a day for 7 days for sinus and upper respiratory infection. 14)  Tussionex Pennkinetic Er 10-8 Mg/59ml Lqcr (Hydrocod polst-chlorphen polst) .Marland Kitchen.. 1 tsp q 12  Patient Instructions: 1)  Upper respiratory infection - persistent: take Augmentin 875 two times a day for 7 days; will try tussionex cough syrup ( oxycodone) 1 tsp every 12 hours. For nausea take phenergan. For rash - stop  syrup. Take mucinex 2 tablets AM and PM. continue advair, fluticasone nasal spray and all your other medications. For congestion try a stove-top vaporizor: pot of water on stove top, turn off heat after boil, put in a dollop of mehtolatum and make a towel tent to be able to inhale the vapor. This will moisturize the nasal passages and fee great.  Prescriptions: TUSSIONEX PENNKINETIC ER 10-8 MG/5ML LQCR (HYDROCOD POLST-CHLORPHEN POLST) 1 tsp q 12  #300 cc x 0   Entered and Authorized by:   Jacques Navy MD   Signed by:   Jacques Navy MD on 02/16/2010   Method used:   Handwritten   RxID:   [id number] AUGMENTIN 875-125 MG TABS (AMOXICILLIN-POT CLAVULANATE) 1 by mouth two times a day for 7 days for sinus and upper respiratory infection.  #14 x 0   Entered and Authorized by:   Jacques Navy MD   Signed by:   Jacques Navy MD on 02/16/2010   Method used:   Electronically to        [facility] (retail)       [address]       [place], [place]  [address]       Ph: [phone]       Fax: [phone]   RxID:   [id number]    Orders Added: 1)  Est. Patient Level III [06301]

## 2010-05-09 NOTE — Assessment & Plan Note (Signed)
Summary: rash/cd   Vital Signs:  Patient profile:   45 year old female Height:      68 inches Weight:      254 pounds BMI:     38.76 O2 Sat:      98 % on Room air Temp:     98.3 degrees F oral Pulse rate:   78 / minute BP sitting:   128 / 96  (left arm) Cuff size:   large  Vitals Entered By: Bill Salinas CMA (July 25, 2009 11:39 AM)  O2 Flow:  Room air CC: pt here with complaint of rash near her vagina that she states started 2 weeks ago/ ab   Primary Care Provider:  Fenix Ruppe  CC:  pt here with complaint of rash near her vagina that she states started 2 weeks ago/ ab.  History of Present Illness: Patient seen by Dr. Jonny Ruiz March 11th for recurrent diarrhea. Reviewed his note and all labs: normal chemistries, normal stool studies. She was found to have a UTI with E. coli and Proteus Mirabilis. She was prescribed cephalexin but she did not finish the prescription due to intolerance.   She has a persistent rash in the groin. She tried using monistat but this did not help.  Current Medications (verified): 1)  Diazepam 10 Mg  Tabs (Diazepam) .... As Needed 2)  Nexium 40 Mg  Cpdr (Esomeprazole Magnesium) .... Once Daily As Needed 3)  Meclizine Hcl 25 Mg  Tabs (Meclizine Hcl) .... Q 6 As Needed 4)  Promethazine Hcl 25 Mg  Tabs (Promethazine Hcl) .... Q 6 As Needed 5)  Sudafed 30 Mg  Tabs (Pseudoephedrine Hcl) .... As Directed As Needed 6)  Ibuprofen 200 Mg  Caps (Ibuprofen) .... As Directed As Needed 7)  Furosemide 20 Mg  Tabs (Furosemide) .Marland Kitchen.. 1 By Mouth Once Daily As Needed Fluid Retention. 8)  Lidoderm 5 % Ptch (Lidocaine) .... Prn 9)  Topamax 25 Mg Tabs (Topiramate) .... Ptn 10)  Advair Diskus 250-50 Mcg/dose Aepb (Fluticasone-Salmeterol) .Marland Kitchen.. 1 Puff Once Daily 11)  Proventil Hfa 108 (90 Base) Mcg/act Aers (Albuterol Sulfate) .... 2 Puffs Four Times Per Day As Needed 12)  Fluticasone Propionate 50 Mcg/act Susp (Fluticasone Propionate) .... 2 Spray/side Once Daily 13)   Metronidazole 500 Mg Tabs (Metronidazole) .Marland Kitchen.. 1 By Mouth Qid For 7 Days  Allergies (verified): 1)  ! * Avelox 2)  ! Codeine 3)  ! Percocet 4)  ! Darvocet 5)  Compazine 6)  * Trileptal  Past History:  Past Medical History: Last updated: 03/15/2007 Hx of CHRONIC PAIN DUE TO TRAUMA (ICD-338.21) ASTHMA, CHILDHOOD (ICD-493.00) ACUTE CYSTITIS (ICD-595.0) HEAD TRAUMA, CLOSED (ICD-959.01) INSOMNIA, CHRONIC (ICD-307.42) DYSPEPSIA, CHRONIC (ICD-536.8) ALLERGIC RHINITIS (ICD-477.9)    Past Surgical History: Last updated: 03/15/2007 * REPAIR OF TORN LIGAMENT RIGHT LEG. * REPAIR OF TOE LACERATION Hx of FRACTURE, PATELLA RIGHT (ICD-822.0) Hx of FRACTURE, FOOT RIGHT (ICD-825.20) APPENDECTOMY, HX OF (ICD-V45.79) TONSILLECTOMY, HX OF (ICD-V45.79) PMH-FH-SH reviewed-no changes except otherwise noted, PSH reviewed for relevance, FH reviewed for relevance  Review of Systems  The patient denies anorexia, fever, weight loss, weight gain, chest pain, syncope, dyspnea on exertion, prolonged cough, and abdominal pain.    Physical Exam  General:  alert, well-developed, well-nourished, and well-hydrated.   Head:  normocephalic and atraumatic.   Skin:  macular dark rash in the groin.   Impression & Recommendations:  Problem # 1:  DERMATOPHYTOSIS OF GROIN AND PERIANAL AREA (ICD-110.3) Probable yeast infection in the groin.  Plan -  due to area involved - diflucan 100mg  once daily x 7   Problem # 2:  URI (ICD-465.9)  Patient did not complete antibiotics  Plan - U/A for cure.   Her updated medication list for this problem includes:    Promethazine Hcl 25 Mg Tabs (Promethazine hcl) ..... Q 6 as needed    Ibuprofen 200 Mg Caps (Ibuprofen) .Marland Kitchen... As directed as needed  Orders: TLB-Udip w/ Micro ([id number])  Complete Medication List: 1)  Diazepam 10 Mg Tabs (Diazepam) .... As needed 2)  Nexium 40 Mg Cpdr (Esomeprazole magnesium) .... Once daily as needed 3)  Meclizine Hcl 25 Mg Tabs  (Meclizine hcl) .... Q 6 as needed 4)  Promethazine Hcl 25 Mg Tabs (Promethazine hcl) .... Q 6 as needed 5)  Sudafed 30 Mg Tabs (Pseudoephedrine hcl) .... As directed as needed 6)  Ibuprofen 200 Mg Caps (Ibuprofen) .... As directed as needed 7)  Furosemide 20 Mg Tabs (Furosemide) .Marland Kitchen.. 1 by mouth once daily as needed fluid retention. 8)  Lidoderm 5 % Ptch (Lidocaine) .... Prn 9)  Topamax 25 Mg Tabs (Topiramate) .... Ptn 10)  Advair Diskus 250-50 Mcg/dose Aepb (Fluticasone-salmeterol) .Marland Kitchen.. 1 puff once daily 11)  Proventil Hfa 108 (90 Base) Mcg/act Aers (Albuterol sulfate) .... 2 puffs four times per day as needed 12)  Fluticasone Propionate 50 Mcg/act Susp (Fluticasone propionate) .... 2 spray/side once daily 13)  Fluconazole 100 Mg Tabs (Fluconazole) .Marland Kitchen.. 1 by mouth once daily x 7 for yeast infection groin Prescriptions: FLUCONAZOLE 100 MG TABS (FLUCONAZOLE) 1 by mouth once daily x 7 for yeast infection groin  #7 x 1   Entered and Authorized by:   Jacques Navy MD   Signed by:   Jacques Navy MD on 07/25/2009   Method used:   Electronically to        [facility] (retail)       [address]       [place], [place]  [address]       Ph: [phone]       Fax: [phone]   RxID:   [id number]

## 2010-05-09 NOTE — Assessment & Plan Note (Signed)
Summary: cxr & labs prior/SD   Vital Signs:  Patient profile:   45 year old female Height:      68 inches Weight:      345 pounds BMI:     52.65 O2 Sat:      97 % on Room air Temp:     98.2 degrees F oral Pulse rate:   87 / minute BP sitting:   132 / 90  (left arm) Cuff size:   large  Vitals Entered By: Bill Salinas CMA (February 23, 2010 3:48 PM)  O2 Flow:  Room air CC: pt here with c/o continued cough and mucous (producing green brown mucous)/ ab   Primary Care Provider:  [facility]  CC:  pt here with c/o continued cough and mucous (producing green brown mucous)/ ab.  History of Present Illness: Patient returns with persistent symptoms of URI despite antibiotic therapy. She continues to have congestion and drainage, cough and SOB. He has not had fever or rigors. She has been making a special concoction for cought management which she says does work for her.   Current Medications (verified): 1)  Diazepam 10 Mg  Tabs (Diazepam) .... As Needed 2)  Nexium 40 Mg  Cpdr (Esomeprazole Magnesium) .... Once Daily As Needed 3)  Meclizine Hcl 25 Mg  Tabs (Meclizine Hcl) .... Q 6 As Needed 4)  Promethazine Hcl 25 Mg  Tabs (Promethazine Hcl) .... Q 6 As Needed 5)  Sudafed 30 Mg  Tabs (Pseudoephedrine Hcl) .... As Directed As Needed 6)  Ibuprofen 200 Mg  Caps (Ibuprofen) .... As Directed As Needed 7)  Furosemide 20 Mg  Tabs (Furosemide) .Marland Kitchen.. 1 By Mouth Once Daily As Needed Fluid Retention. 8)  Lidoderm 5 % Ptch (Lidocaine) .... Prn 9)  Topamax 25 Mg Tabs (Topiramate) .... Ptn 10)  Advair Diskus 250-50 Mcg/dose Aepb (Fluticasone-Salmeterol) .Marland Kitchen.. 1 Puff Once Daily 11)  Proventil Hfa 108 (90 Base) Mcg/act Aers (Albuterol Sulfate) .... 2 Puffs Four Times Per Day As Needed 12)  Fluticasone Propionate 50 Mcg/act Susp (Fluticasone Propionate) .Marland Kitchen.. 1 Spray Per Nostril As Needed 13)  Augmentin 875-125 Mg Tabs (Amoxicillin-Pot Clavulanate) .Marland Kitchen.. 1 By Mouth Two Times A Day For 7 Days For Sinus and Upper  Respiratory Infection. 14)  Tussionex Pennkinetic Er 10-8 Mg/42ml Lqcr (Hydrocod Polst-Chlorphen Polst) .Marland Kitchen.. 1 Tsp Q 12 15)  Fluconazole 100 Mg Tabs (Fluconazole) .Marland Kitchen.. 1 Once Daily X 10 Days  Allergies (verified): 1)  ! * Avelox 2)  ! Codeine 3)  ! Percocet 4)  ! Darvocet 5)  Compazine 6)  * Trileptal PMH-FH-SH reviewed-no changes except otherwise noted  Review of Systems       The patient complains of dyspnea on exertion, prolonged cough, and headaches.  The patient denies anorexia, fever, weight loss, decreased hearing, chest pain, abdominal pain, suspicious skin lesions, depression, and enlarged lymph nodes.    Physical Exam  General:  obese white female in no distress Head:  no sinus tenderness to percussion Eyes:  C&S clear Ears:  TMs normal Neck:  supple and no masses.   Lungs:  normal respiratory effort, normal breath sounds, and no wheezes.   Heart:  normal rate and regular rhythm.     Impression & Recommendations:  Problem # 1:  URI (ICD-465.9) Patient with persistent cough and discomfort. CHest x-ray is normal. CBC is normal  Plan - no need for additional anitibiotics           continue supportive care  Her updated medication  list for this problem includes:    Promethazine Hcl 25 Mg Tabs (Promethazine hcl) ..... Q 6 as needed    Ibuprofen 200 Mg Caps (Ibuprofen) .Marland Kitchen... As directed as needed  Complete Medication List: 1)  Diazepam 10 Mg Tabs (Diazepam) .... As needed 2)  Nexium 40 Mg Cpdr (Esomeprazole magnesium) .... Once daily as needed 3)  Meclizine Hcl 25 Mg Tabs (Meclizine hcl) .... Q 6 as needed 4)  Promethazine Hcl 25 Mg Tabs (Promethazine hcl) .... Q 6 as needed 5)  Sudafed 30 Mg Tabs (Pseudoephedrine hcl) .... As directed as needed 6)  Ibuprofen 200 Mg Caps (Ibuprofen) .... As directed as needed 7)  Furosemide 20 Mg Tabs (Furosemide) .Marland Kitchen.. 1 by mouth once daily as needed fluid retention. 8)  Lidoderm 5 % Ptch (Lidocaine) .... Prn 9)  Topamax 25 Mg Tabs  (Topiramate) .... Ptn 10)  Advair Diskus 250-50 Mcg/dose Aepb (Fluticasone-salmeterol) .Marland Kitchen.. 1 puff once daily 11)  Proventil Hfa 108 (90 Base) Mcg/act Aers (Albuterol sulfate) .... 2 puffs four times per day as needed 12)  Fluticasone Propionate 50 Mcg/act Susp (Fluticasone propionate) .Marland Kitchen.. 1 spray per nostril as needed 13)  Augmentin 875-125 Mg Tabs (Amoxicillin-pot clavulanate) .Marland Kitchen.. 1 by mouth two times a day for 7 days for sinus and upper respiratory infection. 14)  Fluconazole 100 Mg Tabs (Fluconazole) .Marland Kitchen.. 1 once daily x 10 days   Orders Added: 1)  Est. Patient Level III [09811]

## 2010-05-11 NOTE — Progress Notes (Signed)
Summary: REQ FOR RX  Phone Note Call from Patient Call back at Home Phone [phone]   Caller: Patient Call For: Jacques Navy MD Summary of Call: Pt requests antibiotic for sinus infection? Pt has sore throat,earache. Initial call taken by: Verdell Face,  May 01, 2010 2:37 PM  Follow-up for Phone Call        supportive care: gargle of choice, low dose decongestant. For fever or more serious symptoms will need an OV Follow-up by: Jacques Navy MD,  May 01, 2010 6:09 PM  Additional Follow-up for Phone Call Additional follow up Details #1::        left detailed vm on pt's hm # - ok per HIPPA form  Additional Follow-up by: Lamar Sprinkles, CMA,  May 01, 2010 6:38 PM

## 2010-05-11 NOTE — Assessment & Plan Note (Signed)
Summary: ?sinus infection/lb   Vital Signs:  Patient profile:   45 year old female Height:      68 inches Weight:      349 pounds BMI:     53.26 O2 Sat:      98 % on Room air Temp:     99.4 degrees F oral Pulse rate:   105 / minute BP sitting:   124 / 82  (left arm) Cuff size:   large  Vitals Entered By: Bill Salinas CMA (May 03, 2010 9:56 AM)  O2 Flow:  Room air CC: pt here with c/o coughing (producing green mucous), sore throat, and urinary freq/ ab   Primary Care Provider:  Donevan Biller  CC:  pt here with c/o coughing (producing green mucous), sore throat, and and urinary freq/ ab.  History of Present Illness: [name] prsents with a several day history of URI symptoms with sinus congestion and pain with some drainage. she has not had any SOB. She does have a cough that is not productive. No fever, sweats, chills. Of note she was recently treated for a UTI with ampicilllin and had urticaria. She has had a URI/sinus infection about every 4-6 weeks.   Current Medications (verified): 1)  Diazepam 10 Mg  Tabs (Diazepam) .... As Needed 2)  Nexium 40 Mg  Cpdr (Esomeprazole Magnesium) .... Once Daily As Needed 3)  Meclizine Hcl 25 Mg  Tabs (Meclizine Hcl) .... Q 6 As Needed 4)  Promethazine Hcl 25 Mg  Tabs (Promethazine Hcl) .... Q 6 As Needed 5)  Sudafed 30 Mg  Tabs (Pseudoephedrine Hcl) .... As Directed As Needed 6)  Ibuprofen 200 Mg  Caps (Ibuprofen) .... As Directed As Needed 7)  Furosemide 20 Mg  Tabs (Furosemide) .Marland Kitchen.. 1 By Mouth Once Daily As Needed Fluid Retention. 8)  Lidoderm 5 % Ptch (Lidocaine) .... Prn 9)  Topamax 25 Mg Tabs (Topiramate) .... Ptn 10)  Advair Diskus 250-50 Mcg/dose Aepb (Fluticasone-Salmeterol) .Marland Kitchen.. 1 Puff Once Daily 11)  Proventil Hfa 108 (90 Base) Mcg/act Aers (Albuterol Sulfate) .... 2 Puffs Four Times Per Day As Needed 12)  Fluticasone Propionate 50 Mcg/act Susp (Fluticasone Propionate) .Marland Kitchen.. 1 Spray Per Nostril As Needed  Allergies  (verified): 1)  ! * Avelox 2)  ! Codeine 3)  ! Percocet 4)  ! Darvocet 5)  Compazine 6)  * Trileptal  Past History:  Past Medical History: Last updated: 03/15/2007 Hx of CHRONIC PAIN DUE TO TRAUMA (ICD-338.21) ASTHMA, CHILDHOOD (ICD-493.00) ACUTE CYSTITIS (ICD-595.0) HEAD TRAUMA, CLOSED (ICD-959.01) INSOMNIA, CHRONIC (ICD-307.42) DYSPEPSIA, CHRONIC (ICD-536.8) ALLERGIC RHINITIS (ICD-477.9)    Past Surgical History: Last updated: 03/15/2007 * REPAIR OF TORN LIGAMENT RIGHT LEG. 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  Review of Systems  The patient denies anorexia, fever, decreased hearing, chest pain, syncope, dyspnea on exertion, headaches, abdominal pain, severe indigestion/heartburn, muscle weakness, suspicious skin lesions, difficulty walking, unusual weight change, and angioedema.    Physical Exam  General:  alert, well-developed, and well-nourished.   Head:  tender to percussion over the  maxillary sinuses Eyes:  vision grossly intact, pupils equal, and pupils round.   Ears:  bulging left TM with mild erythema.  Mouth:  Throat is clear. Neck:  supple and full ROM.   Lungs:  normal respiratory effort, normal breath sounds, no crackles, and no wheezes.   Heart:  normal rate and regular rhythm.   Abdomen:  obese  Msk:  normal ROM, no joint tenderness, no joint swelling, and no joint warmth.   Pulses:  2+ radial pulse Neurologic:  alert & oriented X3,  cranial nerves II-XII intact, strength normal in all extremities, sensation intact to light touch, and DTRs symmetrical and normal.   Skin:  turgor normal and no suspicious lesions.   Cervical Nodes:  no anterior cervical adenopathy and no posterior cervical adenopathy.   Psych:  memory intact for recent and remote, normally interactive, and good eye contact.     Impression & Recommendations:  Problem # 1:  SINUSITIS- ACUTE-NOS (ICD-461.9) Patient with recurrent sinus infection.  Plan - Azithromycin 500mg  once daily x 3           CT sinus without contrast to r/o chronic sinusitis with mucosal thickening            referral for allergy evaluation as underlying cause of infections.  The following medications were removed from the medication list:    Augmentin 875-125 Mg Tabs (Amoxicillin-pot clavulanate) .Marland Kitchen... 1 by mouth two times a day for 7 days for sinus and upper respiratory infection. Her updated medication list for this problem includes:    Sudafed 30 Mg Tabs (Pseudoephedrine hcl) .Marland Kitchen... As directed as needed    Fluticasone Propionate 50 Mcg/act Susp (Fluticasone propionate) .Marland Kitchen... 1 spray per nostril as needed    Azithromycin 500 Mg Tabs (Azithromycin) .Marland Kitchen... 1 by mouth once daily x 3 (10 days of coverage)  Orders: Radiology Referral (Radiology) Allergy Referral  (Allergy)  Complete Medication List: 1)  Diazepam 10 Mg Tabs (Diazepam) .... As needed 2)  Nexium 40 Mg Cpdr (Esomeprazole magnesium) .... Once daily as needed 3)  Meclizine Hcl 25 Mg Tabs (Meclizine hcl) .... Q 6 as needed 4)  Promethazine Hcl 25 Mg Tabs (Promethazine hcl) .... Q 6 as needed 5)  Sudafed 30 Mg Tabs (Pseudoephedrine hcl) .... As directed as needed 6)  Ibuprofen 200 Mg Caps (Ibuprofen) .... As directed as needed 7)  Furosemide 20 Mg Tabs (Furosemide) .Marland Kitchen.. 1 by mouth once daily as needed fluid retention. 8)  Lidoderm 5 % Ptch (Lidocaine) .... Prn 9)  Topamax 25 Mg Tabs (Topiramate) .... Ptn 10)  Advair  Diskus 250-50 Mcg/dose Aepb (Fluticasone-salmeterol) .Marland Kitchen.. 1 puff once daily 11)  Proventil Hfa 108 (90 Base) Mcg/act Aers (Albuterol sulfate) .... 2 puffs four times per day as needed 12)  Fluticasone Propionate 50 Mcg/act Susp (Fluticasone propionate) .Marland Kitchen.. 1 spray per nostril as needed 13)  Azithromycin 500 Mg Tabs (Azithromycin) .Marland Kitchen.. 1 by mouth once daily x 3 (10 days of coverage) Prescriptions: AZITHROMYCIN 500 MG TABS (AZITHROMYCIN) 1 by mouth once daily x 3 (10 days of coverage)  #3 x 0   Entered and Authorized by:   Jacques Navy MD   Signed by:   Jacques Navy MD on 05/03/2010   Method used:   Electronically to        [facility] (retail)       [address]       [place], [place]  [address]       Ph: [phone]       Fax: [phone]   RxID:   [id number]    Orders Added: 1)  Radiology Referral [Radiology] 2)  Allergy Referral  [Allergy] 3)  Est. Patient Level III [[address]213]    Laboratory Results   Urine Tests   Date/Time Reported: Ami Bullins CMA  May 03, 2010 10:01 AM   Routine Urinalysis   Color: lt. yellow Appearance: Clear Glucose: negative   (Normal Range: Negative) Bilirubin: negative   (Normal Range: Negative) Ketone: negative   (Normal Range: Negative) Spec. Gravity: 1.015   (Normal Range: 1.003-1.035) Blood: negative   (Normal Range: Negative) pH: 5.0   (Normal Range: 5.0-8.0) Protein: negative   (Normal Range: Negative) Urobilinogen: 0.2   (Normal Range: 0-1) Nitrite: negative   (Normal Range: Negative) Leukocyte Esterace: negative   (Normal Range: Negative)

## 2010-05-17 NOTE — Progress Notes (Signed)
Summary: conusult first available is 3/1 dr Debby Bud wants her seen sooner  Phone Note From Other Clinic Call back at [facility] Phone [phone]   Caller: debra with dr Debby Bud Call For: young Summary of Call: Debra with Dr Debby Bud phoned and wanted this patient seen Dr. Sinclair Ship first available is 06/08/2010 and Dr. Debby Bud wants this patient seen sooner for accute sinustis patient is scheduled for CT of her sinus on 05/05/2010. Please call the patient with appointment [phone] Initial call taken by: Vedia Coffer,  May 03, 2010 4:10 PM  Follow-up for Phone Call       Follow-up by: Jacques Navy MD,  May 08, 2010 8:22 AM

## 2010-07-19 ENCOUNTER — Telehealth: Payer: Self-pay | Admitting: *Deleted

## 2010-07-19 NOTE — Telephone Encounter (Signed)
Pt aware.

## 2010-07-19 NOTE — Telephone Encounter (Signed)
Placed referral order to [facility]

## 2010-07-19 NOTE — Telephone Encounter (Signed)
Patient requesting referral to allergy specialist. She has had chronic "seasonal" allergy problems. Over the last few months she has become "hypersensitive" to smells, etc.. Recently she has had episodes of hives, itching all over, coughing & sneezing fits. She is getting more concerned about this increase and wants to see an allergist asap (this or next week).  Also - has been on morphine x 6 years w/no problems. Her last dose this past week caused her itching and dizziness (apt w/pain MD is end of this mth). She does have an allergy to bees from when she was a child but does not have an Epipen.   Claritin does not help, currently she is taking zytrec in the am and benadryl at night.

## 2010-07-27 ENCOUNTER — Other Ambulatory Visit: Payer: Self-pay | Admitting: Orthopaedic Surgery

## 2010-07-27 ENCOUNTER — Ambulatory Visit
Admission: RE | Admit: 2010-07-27 | Discharge: 2010-07-27 | Disposition: A | Discharge status: Home / Self Care | Payer: Medicare Other | Source: Ambulatory Visit | Attending: Orthopaedic Surgery | Admitting: Orthopaedic Surgery

## 2010-07-27 DIAGNOSIS — M25562 Pain in left knee: Secondary | ICD-10-CM

## 2010-08-25 NOTE — Discharge Summary (Signed)
[name] Wright, [name] Wright          ACCOUNT NO.:  [id number]   MEDICAL RECORD NO.:  [id number]          PATIENT TYPE:  INP   LOCATION:  0380                         FACILITY:  [facility]   PHYSICIAN:  Jonna L. Robb Matar, M.D.DATE OF BIRTH:  05-14-1965   DATE OF ADMISSION:  04/28/2004  DATE OF DISCHARGE:                                 DISCHARGE SUMMARY   ADDENDUM:   DISCHARGE MEDICATIONS:  1.  Cefaclor 500 b.i.d. for five days.  2.  Monistat cream daily.  3.  Valium.  4.  Dilaudid.  5.  Mepergan Fortis.  6.  Trileptal as instructed by her chronic pain physician.      JLB/MEDQ  D:  05/04/2004  T:  05/04/2004  Job:  [id number]

## 2010-08-25 NOTE — Procedures (Signed)
[name] Wright, [name] Wright          ACCOUNT NO.:  [id number]   MEDICAL RECORD NO.:  [id number]          PATIENT TYPE:  OUT   LOCATION:  SLEEP CENTER                 FACILITY:  [facility]   PHYSICIAN:  Clinton D. Maple Hudson, M.D. DATE OF BIRTH:  16-Nov-1965   DATE OF STUDY:  06/19/2004                              NOCTURNAL POLYSOMNOGRAM   REFERRING PHYSICIAN:  Dr. Linton Ham.   INDICATIONS FOR STUDY:  Insomnia with sleep apnea. History of head trauma.  Difficulty initiating and maintaining sleep. Epworth sleeping score of 4/24,  BMI 42, weight 278 pounds.   SLEEP ARCHITECTURE:  Total sleep time 308 minutes with sleep efficiency of  68%. Stage I was 18%, stage II was 74%, stages III and IV were absent. REM  was 8% of total sleep time. Sleep latency 12 minutes. REM latency 152  minutes. Awake after sleep onset 136 minutes, especially between 02:45 and  04:45. Arousal index 11.7. The patient took Trileptal at bedtime and has a  long list of medications likely to affect sleep.   RESPIRATORY DATA:  NPSG protocol. Respiratory disturbance index (RDI) 9.3  obstructive events per hour indicating mild obstructive sleep apnea/hypopnea  syndrome. This included 3 obstructive apneas, 1 mixed apnea, and 44  hypopneas. Most sleep and therefore most respiratory events were recorded  while on the right side.   OXYGEN DATA:  Moderate to loud snoring with oxygen desaturation to a nadir  of 90%. Mean oxygen saturation through the study was 96% on room air.   CARDIAC DATA:  Normal sinus rhythm.   MOVEMENT/PARASOMNIA:  A total of 49 limb jerks were recorded of which 9 were  associated with arousal or awakening for a periodic limb movement with  arousal index of 1.8 per hour, which is probably not significant. One  bathroom trip.   IMPRESSION/RECOMMENDATIONS:  1.  Mild obstructive sleep apnea/hypopnea syndrome with RDI of 9.3 per hour      with moderate snoring and oxygen desaturation to a nadir of 90%.  2.   She may qualify for CPAP therapy if there is documented daytime      hypersomnolence or other symptoms of obstructive sleep apnea. Consider      return for CPAP titration or evaluate for alternative therapies if      appropriate.  3.  Inability to sustain sleep as noted, mainly in the late night/early      morning hours. Relationship to her pharmaceutical management is unclear.      CDY/MEDQ  D:  06/25/2004 10:22:11  T:  06/26/2004 08:39:06  Job:  [id number]

## 2010-08-25 NOTE — Op Note (Signed)
NAMEJASMINE, [name] Wright          ACCOUNT NO.:  [id number]   MEDICAL RECORD NO.:  [id number]          PATIENT TYPE:  AMB   LOCATION:  SDC                           FACILITY:  [facility]   PHYSICIAN:  Guy Sandifer. Henderson Cloud, M.D. DATE OF BIRTH:  [birth year]   DATE OF PROCEDURE:  05/08/2006  DATE OF DISCHARGE:                               OPERATIVE REPORT   PREOPERATIVE DIAGNOSIS:  Stress urinary continence.   POSTOPERATIVE DIAGNOSIS:  Stress urinary continence.   PROCEDURES:  1. Needleless sling.  2. Cystoscopy.   SURGEON:  Guy Sandifer. Henderson Cloud, M.D.   ANESTHESIA:  General with endotracheal intubation.   ESTIMATED BLOOD LOSS:  Drops.   INDICATIONS AND CONSENT:  This patient is a 45 year old married white  female, G2, P0, husband status post vasectomy, with urinary  incontinence.  Details have been dictated in the history and physical.  A suburethral sling has been discussed with the patient preoperatively.  Potential risks and complications have been reviewed preoperatively  including but not limited to infection, organ damage, bleeding requiring  transfusion of blood products and possible transfusion reaction, HIV and  hepatitis acquisition, DVT, PE, pneumonia, success and failure rate of  the sling, erosion, possible return to the OR, possible prolonged  catheterization.  All questions have been answered and consent is signed  on the chart.   PROCEDURE:  Patient taken to the operating room, where she is  identified, placed in the dorsal supine position and general anesthesia  is induced via endotracheal intubation.  She is then placed in the  dorsal lithotomy position, where she is prepped and draped in a sterile  fashion.  A Foley catheter is placed in the bladder, the bladder is  drained, and the Foley catheter is left in place.  The suburethral  vaginal mucosa is grasped with Allis clamps and injected with 0.5%  lidocaine with 1:200,000 epinephrine.  A small incision is made below  the urethra.  Dissection is then made below the vaginal mucosa  bilaterally with Metzenbaum scissors up to the level of the urogenital  diaphragm at the 10 and 2 o'clock positions.  Then using the needleless  sling grasped with a Kelly clamp, it is placed through the urogenital  diaphragm bilaterally.  The Foley catheter is then removed and  cystoscopy is carried out with the 70 degrees cystoscope.  Inspection  reveals no evidence of perforation to the bladder or the urethra.  The  ureteral orifices are identified bilaterally and there are no foreign  bodies.  The cystoscope is removed, Foley catheter is replaced, and the  bladder is drained.  The sling is seen to be resting against the tissues  below the urethra but without any undue  tension.  The suture marking the midline of the sling is removed.  The  mucosa is closed with 2-0 running locking Monocryl.  The Foley catheter  is left in place and drained to a bag.  All counts correct.  The patient  is taken to recovery room in stable condition.      Guy Sandifer Henderson Cloud, M.D.  Electronically Signed     JET/MEDQ  D:  05/08/2006  T:  05/08/2006  Job:  [id number]

## 2010-08-25 NOTE — Discharge Summary (Signed)
[name], [name]          ACCOUNT NO.:  [id number]   MEDICAL RECORD NO.:  [id number]          PATIENT TYPE:  INP   LOCATION:  0380                         FACILITY:  [facility]   PHYSICIAN:  Michaelyn Barter, M.D. DATE OF BIRTH:  1965/12/21   DATE OF ADMISSION:  04/28/2004  DATE OF DISCHARGE:  05/04/2004                                 DISCHARGE SUMMARY   HISTORY OF PRESENT ILLNESS:  [name] Wright is a 45 year old female with a  past medical history of post-concussion syndrome who arrived in the  emergency room with a chief complaint of dyspnea, cough, and fever.  She  stated that she had experienced these symptoms over the 2 weeks prior to her  presentation in the emergency room.  In addition, she complained of fever  and chills.  She was initially treated at a local urgent care approximately  1-1/2 to 2 weeks ago for a pneumonia.  Her treatment regimen consisted of  Avelox, a steroid injection, and a prednisone dose taper.  Following that  visit, she returned to the urgent care center multiple times.  Her recent  treatment consisted of breathing treatments, Levaquin antibiotic, and a  second steroid dose pack.  In addition, she received a Solu-Medrol  injection, and subsequently developed a rash over her torso, felt to be  secondary to the steroids.  For this, she was treated with Zyrtec; however,  she discontinued the Zyrtec secondary to insomnia.  She stated that her  symptoms had not improved; therefore, she decided to come to the emergency  room.  After speaking with her significant other, he relayed that he had  also recently been treated for a pneumonia.   PAST MEDICAL HISTORY:  1.  Post-concussion syndrome, February 24, 2001, at which time the patient      sustained a head injury after a 70 pound object fell on her head.  She      has chronic backaches, numbness on the left side of her body, tingling,      and various other paresthesias on and off.  2.  History of  shingles.  3.  History of nephrolithiasis.  4.  Obesity.   PAST SURGICAL HISTORY:  1.  Appendectomy.  2.  Tonsillectomy.  3.  T&A.  4.  Excision of a cyst from her lumbar back.   ALLERGIES:  COMPAZINE.   SOCIAL HISTORY:  Cigarettes - the patient denies.  Alcohol - the patient  denies.  Street drugs - the patient denies.   FAMILY HISTORY:  Father died from a stroke.  Mother has multiple sclerosis.   HOSPITAL COURSE:  1.  Pneumonia.  The patient was admitted with a diagnosis of pneumonia.  She      initially had a chest x-ray completed which was interpreted by the      radiologist as low inspiratory lung volumes, no active disease, on      April 28, 2004.  The following day, she again had a repeat chest x-ray      completed, which was interpreted as no active disease.  But again, low      lung volumes were  seen.  She was started empirically on IV antibiotics,      which initially consisted of Rocephin 1 gm IV daily, and azithromycin      500 mg IV daily.  Likewise, on her initial presentation, she had an      elevated white blood cell count of 21.6, and was noted to have had a      fever or 101.7.  It was felt at that particular time that despite the x-      ray findings, the leukocytosis may have been secondary to pneumonia      versus acute bronchitis.  Over the course of her hospitalization, her      white blood cell count declined; likewise, she remained afebrile over      the course of her hospitalization with a current temperature of 98.0.      She did, however, complain of some occasional shortness of breath and a      productive cough.  Today, however, she states that her cough is less      productive, and she denies feeling currently short of breath.  2.  Acute renal insufficiency.  At the time of admission, the patient's      creatinine was 2.4.  This was felt to be secondary to volume depletion,      and this was confirmed by the fact that with IV hydration, the  patient's      creatinine declined from 2.4 on the day of admission to 0.7 as of      May 02, 2004.  3.  Oral thrush.  This was felt go be secondary to the antibiotics and/or      steroid therapy that the patient previously received at the urgent care      facility.  The patient was started on Diflucan secondary to this.  4.  Allergic dermatitis.  The patient received Benadryl over the course of      her hospitalization for her symptoms.  She stated that her rash had      improved over the course of her hospitalization.  5.  Hypoalbuminemia.  In spite of the fact that the patient is morbidly      obese, her prealbumin level was significantly low, with a result of 8.4.      Nutrition was consulted.  They recommended changing the diet to soft,      encouraging increased p.o. intake, and fluid intake.  In addition, they      recommended starting a multivitamin with minerals daily, and considering      adding Lactinex one packet q.i.d., which was subsequently done.  6.  Vaginal yeast infection.  Despite being on Diflucan, the patient stated      that she had developed a vaginal yeast infection, which appeared to not      be responding to the Diflucan.  Therefore, Monistat therapy was      initiated.  She states that she has had vaginal yeast infections in the      past, and that they responded to this regimen of medication.  Therefore,      it was initiated.  7.  Multiple episodes of emesis.  The patient's husband stated that the      patient has experienced bouts of emesis in the past, and the patient      states that over the course of her hospitalization, she has also      experienced episodes of emesis along with nausea.  Therefore, Phenergan      was provided to the patient on a p.r.n. basis.  Today, May 03, 2004,      plans were made to discharge the patient home.  However, when the      patient was approached regarding discharge, she stated that she had     experienced multiple  episodes of emesis the previous night, and that her      cough was still productive.  She also stated that she felt weak.      Therefore, the decision was made to keep the patient for an additional      day.  The patient will tentatively be discharged home tomorrow, May 04, 2004.   FINAL DIAGNOSES AT DISCHARGE:  1.  Pneumonia.  2.  Dehydration.  3.  Oral thrush.  4.  Allergic dermatitis.  5.  Hypoalbuminemia.  6.  Vaginal yeast infection.  7.  Acute renal insufficiency.   DISCHARGE MEDICATIONS:  The discharge medications will be dictated at the  time of her actual release from the hospital.      OR/MEDQ  D:  05/03/2004  T:  05/03/2004  Job:  [id number]
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
## 2010-11-20 ENCOUNTER — Ambulatory Visit: Payer: Medicare Other | Admitting: *Deleted

## 2010-12-14 ENCOUNTER — Ambulatory Visit (INDEPENDENT_AMBULATORY_CARE_PROVIDER_SITE_OTHER): Payer: Medicare Other | Admitting: Internal Medicine

## 2010-12-14 ENCOUNTER — Encounter: Payer: Self-pay | Admitting: Internal Medicine

## 2010-12-14 ENCOUNTER — Other Ambulatory Visit (INDEPENDENT_AMBULATORY_CARE_PROVIDER_SITE_OTHER): Payer: Medicare Other

## 2010-12-14 VITALS — BP 148/92 | HR 100 | Temp 97.6°F | Wt 379.0 lb

## 2010-12-14 DIAGNOSIS — E8779 Other fluid overload: Secondary | ICD-10-CM

## 2010-12-14 DIAGNOSIS — R609 Edema, unspecified: Secondary | ICD-10-CM

## 2010-12-14 DIAGNOSIS — Z79899 Other long term (current) drug therapy: Secondary | ICD-10-CM

## 2010-12-14 LAB — COMPREHENSIVE METABOLIC PANEL
ALT: 20 U/L (ref 0–35)
AST: 35 U/L (ref 0–37)
Albumin: 3.6 g/dL (ref 3.5–5.2)
Calcium: 8.6 mg/dL (ref 8.4–10.5)
Chloride: 106 mEq/L (ref 96–112)
Potassium: 5.2 mEq/L — ABNORMAL HIGH (ref 3.5–5.1)
Sodium: 139 mEq/L (ref 135–145)

## 2010-12-14 LAB — VITAMIN B12: Vitamin B-12: 226 pg/mL (ref 211–911)

## 2010-12-14 NOTE — Progress Notes (Signed)
  Subjective:    Patient ID: [name] Wright, female    DOB: 10-29-1965, 45 y.o.   MRN: [id number]  HPI Mrs. Stefanko presents with multiple medical problems. She is c/o fluid retention such that she was very bloated. She started furosemide 80mg  qd and lost 20 lbs in 3 days. She did continue with fluid intake with mixed salts. Several days ago she had the sudden on-set of diaphoresis - profuse along with leg cramps. She was able to hydrate and felt better. Question of volume depletion due to over diuresis. Also question as to why she has fluid retention.  She has had several boils that resolved spontaneously  She continues on allergy shots for which she premedicates with benadryl.    Review of Systems System review is negative for any constitutional, cardiac, pulmonary, GI or neuro symptoms or complaints     Objective:   Physical Exam Vitals stable Gen'l - obese white woman in no distress Cor - RRR, No m/r/g Pul - normal respirations        Assessment & Plan:  Fluid retention - no evidence of renal failure as a potential cause of anasarca/fluid retention.  Plan - r/o metabolic derangement - thyroid function labs, Bmet          R/o cardiac origin, i.e. Heart failure - 2 D echo          Avoid excessive dosing of lasix - to avoid intravascular depletion, hypotension with vaso-vagal response.

## 2010-12-18 ENCOUNTER — Encounter: Payer: Self-pay | Admitting: Internal Medicine

## 2010-12-20 ENCOUNTER — Telehealth: Payer: Self-pay | Admitting: *Deleted

## 2010-12-20 NOTE — Telephone Encounter (Signed)
Labs normal with A1C 5.9% - very good. May send lab letter.

## 2010-12-20 NOTE — Telephone Encounter (Signed)
Patient requesting results of labs. I do not see these in the OV notes. Please advise.

## 2010-12-21 ENCOUNTER — Ambulatory Visit (INDEPENDENT_AMBULATORY_CARE_PROVIDER_SITE_OTHER): Payer: Medicare Other | Admitting: Internal Medicine

## 2010-12-21 ENCOUNTER — Encounter: Payer: Self-pay | Admitting: Internal Medicine

## 2010-12-21 VITALS — BP 120/80 | HR 81 | Temp 97.9°F | Ht 68.0 in | Wt 375.5 lb

## 2010-12-21 DIAGNOSIS — J019 Acute sinusitis, unspecified: Secondary | ICD-10-CM

## 2010-12-21 DIAGNOSIS — R609 Edema, unspecified: Secondary | ICD-10-CM

## 2010-12-21 DIAGNOSIS — J309 Allergic rhinitis, unspecified: Secondary | ICD-10-CM

## 2010-12-21 MED ORDER — CEPHALEXIN 500 MG PO CAPS: 500.0000 mg | ORAL_CAPSULE | Freq: Four times a day (QID) | ORAL | Status: AC

## 2010-12-21 MED ORDER — FUROSEMIDE 80 MG PO TABS: 80.0000 mg | ORAL_TABLET | ORAL | Status: DC | PRN

## 2010-12-21 NOTE — Progress Notes (Signed)
Subjective:    Patient ID: [name] Wright, female    DOB: [birth year], 45 y.o.   MRN: [id number]  HPI  Here with 3 days acute onset fever, facial pain, pressure, general weakness and malaise, and greenish d/c, with slight ST, but little to no cough and Pt denies chest pain, increased sob or doe, wheezing, orthopnea, PND, increased LE swelling, palpitations, dizziness or syncope.  Does have several wks ongoing nasal allergy symptoms with clear congestion, itch and sneeze, without fever, pain, ST, cough or wheezing, but overall has done better this yr without recurrent infections since she has been getting the allergy shots per Dr Corinda Gubler.  Also  - Wanted to review labs one per Dr Debby Bud last wk - reviewed with pt, no signficant abnormalities.  Has lost 5 lbs with better diet since last wk per pt. Has not heard from [facility] on echo scheduling yet. No edema today. Past Medical History  Diagnosis Date  . Chronic pain due to trauma   . Childhood asthma   . Acute cystitis   . Head trauma     closed  . Chronic insomnia   . Dyspepsia   . Allergic rhinitis    Past Surgical History  Procedure Date  . Repair of torn ligament right leg   . Repair of toe laceration   . Patella fracture surgery     right  . Right foot fracture   . Appendectomy   . Tonsillectomy     reports that she has never smoked. She does not have any smokeless tobacco history on file. Her alcohol and drug histories not on file. family history includes Cancer in her maternal grandmother. Allergies  Allergen Reactions  . Codeine     REACTION: rash  . Moxifloxacin     REACTION: swelling and rash  . Oxcarbazepine     REACTION: immune system suppressed  . Oxycodone-Acetaminophen     REACTION: rash and vomiting  . Prochlorperazine Edisylate     REACTION: hyper and jittery  . Propoxyphene N-Acetaminophen     REACTION: rash and vomiting   Current Outpatient Prescriptions on File Prior to Visit  Medication Sig Dispense  Refill  . albuterol (PROVENTIL HFA;VENTOLIN HFA) 108 (90 BASE) MCG/ACT inhaler Inhale 2 puffs into the lungs every 6 (six) hours as needed.        . diazepam (VALIUM) 10 MG tablet Take 10 mg by mouth every 6 (six) hours as needed.        Marland Kitchen esomeprazole (NEXIUM) 40 MG capsule Take 40 mg by mouth daily before breakfast.        . fluticasone (VERAMYST) 27.5 MCG/SPRAY nasal spray Place 1 spray into the nose daily.        . Fluticasone-Salmeterol (ADVAIR) 250-50 MCG/DOSE AEPB Inhale 1 puff into the lungs daily.        . furosemide (LASIX) 80 MG tablet Take 80 mg by mouth as needed.        Marland Kitchen ibuprofen (ADVIL,MOTRIN) 800 MG tablet Take 800 mg by mouth every 8 (eight) hours as needed.        . lidocaine (LIDODERM) 5 % Place 1 patch onto the skin as needed. Remove & Discard patch within 12 hours or as directed by MD       . meclizine (ANTIVERT) 25 MG tablet Take 25 mg by mouth 3 (three) times daily as needed.        . promethazine (PHENERGAN) 25 MG tablet Take 25 mg by mouth every  6 (six) hours as needed.        . pseudoephedrine (SUDAFED) 30 MG tablet Take 30 mg by mouth every 4 (four) hours as needed.        . topiramate (TOPAMAX) 25 MG capsule Take 25 mg by mouth as needed.         Review of Systems Review of Systems  Constitutional: Negative for diaphoresis and unexpected weight change.  HENT: Negative for drooling and tinnitus.   Eyes: Negative for photophobia and visual disturbance.  Respiratory: Negative for choking and stridor.   Gastrointestinal: Negative for vomiting and blood in stool.  Genitourinary: Negative for hematuria and decreased urine volume.     Objective:   Physical Exam BP 120/80  Pulse 81  Temp(Src) 97.9 F (36.6 C) (Oral)  Ht 5\' 8"  (1.727 m)  Wt 375 lb 8 oz (170.326 kg)  BMI 57.09 kg/m2  SpO2 99%  LMP 12/18/2010 Physical Exam  VS noted, mild ill Constitutional: Pt appears well-developed and well-nourished.  HENT: Head: Normocephalic.  Right Ear: External ear  normal.  Left Ear: External ear normal.  Bilat tm's mild erythema.  Sinus tender bilat.  Pharynx mild erythema Eyes: Conjunctivae and EOM are normal. Pupils are equal, round, and reactive to light.  Neck: Normal range of motion. Neck supple.  Cardiovascular: Normal rate and regular rhythm.   Pulmonary/Chest: Effort normal and breath sounds normal.  Neurological: Pt is alert. No cranial nerve deficit.  Skin: Skin is warm. No erythema. No edema Psychiatric: Pt behavior is normal. Thought content normal. 1+ nervous        Assessment & Plan:

## 2010-12-21 NOTE — Patient Instructions (Signed)
Take all new medications as prescribed - the antibiotic Continue all other medications as before - the lasix (also refilled) Please see the [facility] today before leaving to check on status of the Echo referral

## 2010-12-21 NOTE — Assessment & Plan Note (Signed)
Mild to mod, for antibx course,  to f/u any worsening symptoms or concerns 

## 2010-12-21 NOTE — Assessment & Plan Note (Signed)
stable overall by hx and exam, most recent data reviewed with pt, and pt to continue medical treatment as before  Lab Results  Component Value Date   WBC 10.1 02/23/2010   HGB 12.0 02/23/2010   HCT 34.7* 02/23/2010   PLT 347.0 02/23/2010   ALT 20 12/14/2010   AST 35 12/14/2010   NA 139 12/14/2010   K 5.2* 12/14/2010   CL 106 12/14/2010   CREATININE 0.5 12/14/2010   BUN 15 12/14/2010   CO2 22 12/14/2010   TSH 2.46 12/14/2010   HGBA1C 5.9 12/14/2010

## 2010-12-21 NOTE — Assessment & Plan Note (Signed)
Resolved with re-start leftover lasix 80 qd prn - encouraged to cont as is, reassured, also to see [facility] regarding scheduling of the echo

## 2010-12-25 ENCOUNTER — Telehealth: Payer: Self-pay

## 2010-12-25 MED ORDER — SULFAMETHOXAZOLE-TRIMETHOPRIM 800-160 MG PO TABS: 1.0000 | ORAL_TABLET | Freq: Two times a day (BID) | ORAL | Status: AC

## 2010-12-25 NOTE — Telephone Encounter (Signed)
Patient needs alternative to generic keflex, she has reaction to and needs alternative. [facility]

## 2010-12-25 NOTE — Telephone Encounter (Signed)
Done per emr - changed keflex to septra ds

## 2010-12-25 NOTE — Telephone Encounter (Signed)
Patient needs alternative to Keflex as makes her sick. Her pharmacy is [facility]

## 2010-12-25 NOTE — Telephone Encounter (Signed)
Pt states that she had discussed seeing a gland specialist. She wanted your thought on this. I informed pt that I would get the message to you that you would no be in the office until next week. Please advise. Pt wanting me to call her next week [phone]

## 2010-12-25 NOTE — Telephone Encounter (Signed)
Patient informed. 

## 2010-12-26 ENCOUNTER — Ambulatory Visit (HOSPITAL_COMMUNITY): Payer: Medicare Other | Attending: Internal Medicine | Admitting: Radiology

## 2010-12-26 DIAGNOSIS — I079 Rheumatic tricuspid valve disease, unspecified: Secondary | ICD-10-CM | POA: Insufficient documentation

## 2010-12-26 DIAGNOSIS — E669 Obesity, unspecified: Secondary | ICD-10-CM | POA: Insufficient documentation

## 2010-12-26 DIAGNOSIS — R05 Cough: Secondary | ICD-10-CM | POA: Insufficient documentation

## 2010-12-26 DIAGNOSIS — R609 Edema, unspecified: Secondary | ICD-10-CM | POA: Insufficient documentation

## 2010-12-26 DIAGNOSIS — R059 Cough, unspecified: Secondary | ICD-10-CM | POA: Insufficient documentation

## 2010-12-26 DIAGNOSIS — R062 Wheezing: Secondary | ICD-10-CM | POA: Insufficient documentation

## 2010-12-28 ENCOUNTER — Other Ambulatory Visit (HOSPITAL_COMMUNITY): Payer: Medicare Other | Admitting: Radiology

## 2010-12-31 NOTE — Telephone Encounter (Signed)
Normal A1C. No identified endocrine problem, i.e. "gland" problem. Can have OV to discuss her concerns

## 2011-01-01 NOTE — Telephone Encounter (Signed)
Totally normal echo

## 2011-01-01 NOTE — Telephone Encounter (Signed)
Spoke with pt she is wanting results of her ECHO

## 2011-01-02 NOTE — Telephone Encounter (Signed)
Informed pt .

## 2011-01-05 ENCOUNTER — Ambulatory Visit: Payer: Medicare Other | Admitting: Internal Medicine

## 2011-01-15 ENCOUNTER — Ambulatory Visit (INDEPENDENT_AMBULATORY_CARE_PROVIDER_SITE_OTHER): Payer: Medicare Other | Admitting: Internal Medicine

## 2011-01-15 VITALS — BP 122/90 | HR 91 | Temp 98.4°F | Wt 366.0 lb

## 2011-01-15 DIAGNOSIS — R609 Edema, unspecified: Secondary | ICD-10-CM

## 2011-01-15 DIAGNOSIS — J45909 Unspecified asthma, uncomplicated: Secondary | ICD-10-CM

## 2011-01-15 NOTE — Progress Notes (Signed)
  Subjective:    Patient ID: [name] Wright, female    DOB: [birth year], 45 y.o.   MRN: [id number]  HPI Ms. [name] Wright present for review of labs related to fluid retention. Labs were normal except for slightly low normal B12. 2 D echo - mildly elevated EF @ 65-70% but well controlled BP.  She asks about use of vitamin D - recommended [phone] iu daily  She has seen dermatologist and she has been treated with cipro for 21 days and will then be on doxycycline for a while.   I have reviewed the patient's medical history in detail and updated the computerized patient record.    Review of Systems System review is negative for any constitutional, cardiac, pulmonary, GI or neuro symptoms or complaints other than as described in the HPI.     Objective:   Physical Exam Vitals noted Gen'l obese white woman in no distress HEENT C&S clear Cor - RRR, 2+ radial pulse, no pitting edema Lungs - CTAP Neuor - non focal       Assessment & Plan:

## 2011-01-15 NOTE — Patient Instructions (Signed)
Fluid retention - loss of 15 lbs is probable fluid loss. Labs were all normal. 2 D echo with slightly elevated ejection fraction but otherwise normal. Plan - continue present medications including lasix daily unless you will be exerting yourself more than usual - watch out for dehydration.  B12 is slightly low end of normal. Plan - take OTC B12 supplement, about 1000 mcg daily.  For routine bone health - take [phone] international units of Vitamin D along with 1200 mg calcium daily (diet + supplement)  Weight management - keep up the good work.   Return in 3 months for routine lab follow-up of potassium and renal function.

## 2011-01-16 NOTE — Assessment & Plan Note (Signed)
Stable with no respiratory symptoms at today's visit

## 2011-01-16 NOTE — Assessment & Plan Note (Signed)
No explanation for fluid retention. Renal function normal, 2 D echo with maybe a hint of diastolic dysfunction but good EF. She does do better when she takes lasix 80mg  daily. She does know to hydrate if she does more than usual in terms of exertion. No further work-up at this time.

## 2011-02-26 ENCOUNTER — Ambulatory Visit (INDEPENDENT_AMBULATORY_CARE_PROVIDER_SITE_OTHER): Payer: Medicare Other | Admitting: Internal Medicine

## 2011-02-26 ENCOUNTER — Encounter: Payer: Self-pay | Admitting: Internal Medicine

## 2011-02-26 VITALS — BP 122/70 | HR 96 | Temp 98.5°F

## 2011-02-26 DIAGNOSIS — N390 Urinary tract infection, site not specified: Secondary | ICD-10-CM

## 2011-02-26 LAB — POCT URINALYSIS DIPSTICK
Ketones, UA: NEGATIVE
Protein, UA: NEGATIVE
Urobilinogen, UA: 0.2

## 2011-02-26 MED ORDER — SULFAMETHOXAZOLE-TRIMETHOPRIM 800-160 MG PO TABS: 1.0000 | ORAL_TABLET | Freq: Two times a day (BID) | ORAL | Status: AC

## 2011-02-26 MED ORDER — PHENAZOPYRIDINE HCL 200 MG PO TABS: 200.0000 mg | ORAL_TABLET | Freq: Three times a day (TID) | ORAL | Status: AC | PRN

## 2011-02-26 NOTE — Progress Notes (Signed)
  Subjective:    Patient ID: [name] Wright, female    DOB: 08-16-1965, 45 y.o.   MRN: [id number]  HPI complains of UTI symptoms Onset 3 days ago, progressively worse associated with dysuria and small volume voiding with increased frequency denies hematuria, flank pain or fever The patient has a history of same  Past Medical History  Diagnosis Date  . Chronic pain due to trauma   . Childhood asthma   . Acute cystitis   . Head trauma     closed  . Chronic insomnia   . Dyspepsia   . Allergic rhinitis      Review of Systems  Gastrointestinal: Negative for nausea, vomiting, abdominal pain, diarrhea and constipation.  Genitourinary: Negative for vaginal bleeding and pelvic pain.       Objective:   Physical Exam BP 122/70  Pulse 96  Temp(Src) 98.5 F (36.9 C) (Oral)  SpO2 99% Constitutional: She appears well-developed and well-nourished. No distress.  Cardiovascular: Normal rate, regular rhythm and normal heart sounds. Pulmonary/Chest: Effort normal and breath sounds normal. No respiratory distress Abdominal: Soft. Bowel sounds are normal. She exhibits no distension. There is no tenderness. no masses  Udip - trace LE      Assessment & Plan:  UTI - classic symptoms of dysuria with urinary frequency Will treat with empiric Septra despite lack of overwhelming Udip - erx done Also pyridium - recommended symptomatic care + hydration

## 2011-02-26 NOTE — Patient Instructions (Signed)
It was good to see you today. Septra antibiotics and Pyridium for bladder symptoms - Your prescription(s) have been submitted to your pharmacy. Please take as directed and contact our office if you believe you are having problem(s) with the medication(s).

## 2011-04-12 ENCOUNTER — Ambulatory Visit (INDEPENDENT_AMBULATORY_CARE_PROVIDER_SITE_OTHER): Payer: Medicare Other | Admitting: Internal Medicine

## 2011-04-12 ENCOUNTER — Encounter: Payer: Self-pay | Admitting: Internal Medicine

## 2011-04-12 ENCOUNTER — Other Ambulatory Visit (INDEPENDENT_AMBULATORY_CARE_PROVIDER_SITE_OTHER): Payer: Medicare Other

## 2011-04-12 VITALS — BP 118/82 | HR 115 | Temp 98.1°F | Resp 16 | Wt 364.0 lb

## 2011-04-12 DIAGNOSIS — R609 Edema, unspecified: Secondary | ICD-10-CM

## 2011-04-12 DIAGNOSIS — R252 Cramp and spasm: Secondary | ICD-10-CM

## 2011-04-12 DIAGNOSIS — J309 Allergic rhinitis, unspecified: Secondary | ICD-10-CM

## 2011-04-12 LAB — COMPREHENSIVE METABOLIC PANEL
Albumin: 4 g/dL (ref 3.5–5.2)
Alkaline Phosphatase: 101 U/L (ref 39–117)
BUN: 14 mg/dL (ref 6–23)
CO2: 27 mEq/L (ref 19–32)
GFR: 89.11 mL/min (ref >60.00)
Glucose, Bld: 115 mg/dL — ABNORMAL HIGH (ref 70–99)
Total Bilirubin: 0.3 mg/dL (ref 0.3–1.2)
Total Protein: 8 g/dL (ref 6.0–8.3)

## 2011-04-12 LAB — CBC WITH DIFFERENTIAL/PLATELET
Basophils Relative: 0.8 % (ref 0.0–3.0)
Eosinophils Relative: 2.3 % (ref 0.0–5.0)
MCV: 87.4 fl (ref 78.0–100.0)
Monocytes Absolute: 0.5 10*3/uL (ref 0.1–1.0)
Monocytes Relative: 5.8 % (ref 3.0–12.0)
Neutrophils Relative %: 65.1 % (ref 43.0–77.0)
Platelets: 401 10*3/uL — ABNORMAL HIGH (ref 150.0–400.0)
RBC: 4.44 Mil/uL (ref 3.87–5.11)
WBC: 8.9 10*3/uL (ref 4.5–10.5)

## 2011-04-12 MED ORDER — GABAPENTIN 100 MG PO CAPS: 100.0000 mg | ORAL_CAPSULE | Freq: Three times a day (TID) | ORAL | Status: DC

## 2011-04-12 NOTE — Progress Notes (Signed)
  Subjective:    Patient ID: [name] Wright, female    DOB: 12-Aug-1965, 46 y.o.   MRN: [id number]  HPI Mrs. Rog presents for persistent leg pain/cramps. She has tried taking increased doses of baclofen which did not make much different, increased valium didn't help. She gets cramps throughout the day. Discussed use of gabapentin. She does not report true RLS. She does take furosemide 80 daily. She has a myriad of other c/o which will be dealt with at future visit since they are not acute.  I have reviewed the patient's medical history in detail and updated the computerized patient record.    Review of Systems System review is negative for any constitutional, cardiac, pulmonary, GI or neuro symptoms or complaints other than as described in the HPI.     Objective:   Physical Exam Filed Vitals:   04/12/11 1049  BP: 118/82  Pulse: 115  Temp: 98.1 F (36.7 C)  Resp: 16  obese white woman in no distress HEENT- normal Pulm- normal respirations Cor- RRR Neuro- A&O x 3, normal gait        Assessment & Plan:

## 2011-04-15 DIAGNOSIS — R252 Cramp and spasm: Secondary | ICD-10-CM | POA: Insufficient documentation

## 2011-04-15 NOTE — Assessment & Plan Note (Addendum)
Persistent leg cramps unrelieved by increased doses of baclofen. She denies classic symptoms of RLS. Last K was OK- BMET    Component Value Date/Time   NA 140 04/12/2011 1121   K 3.9 04/12/2011 1121   CL 105 04/12/2011 1121   CO2 27 04/12/2011 1121   GLUCOSE 115* 04/12/2011 1121   BUN 14 04/12/2011 1121   CREATININE 0.8 04/12/2011 1121   CALCIUM 9.0 04/12/2011 1121   GFRNONAA 143.50 06/14/2009 1542      Plan - gabapentin 100mg  qhs with advancing dose as needed to 100 mg tid

## 2011-04-15 NOTE — Assessment & Plan Note (Signed)
She is concerned for emerging sinus infection.  Plan - short course of cipro.

## 2011-04-16 ENCOUNTER — Encounter: Payer: Self-pay | Admitting: Internal Medicine

## 2011-04-27 ENCOUNTER — Ambulatory Visit (INDEPENDENT_AMBULATORY_CARE_PROVIDER_SITE_OTHER): Payer: Medicare Other | Admitting: Internal Medicine

## 2011-04-27 ENCOUNTER — Encounter: Payer: Self-pay | Admitting: Internal Medicine

## 2011-04-27 VITALS — BP 130/82 | HR 87 | Temp 99.0°F | Ht 68.0 in | Wt 360.2 lb

## 2011-04-27 DIAGNOSIS — J309 Allergic rhinitis, unspecified: Secondary | ICD-10-CM

## 2011-04-27 DIAGNOSIS — R609 Edema, unspecified: Secondary | ICD-10-CM

## 2011-04-27 DIAGNOSIS — J019 Acute sinusitis, unspecified: Secondary | ICD-10-CM | POA: Insufficient documentation

## 2011-04-27 MED ORDER — MEPERIDINE HCL 50 MG PO TABS: 50.0000 mg | ORAL_TABLET | ORAL | Status: DC | PRN

## 2011-04-27 MED ORDER — LEVOFLOXACIN 500 MG PO TABS: 500.0000 mg | ORAL_TABLET | Freq: Every day | ORAL | Status: AC

## 2011-04-27 NOTE — Patient Instructions (Signed)
Take all new medications as prescribed Continue all other medications as before  

## 2011-04-27 NOTE — Assessment & Plan Note (Signed)
stable overall by hx and exam, , and pt to continue medical treatment as before   

## 2011-04-27 NOTE — Assessment & Plan Note (Signed)
Mild to mod, for antibx course,  to f/u any worsening symptoms or concerns 

## 2011-04-27 NOTE — Progress Notes (Signed)
Subjective:    Patient ID: [name] Wright, female    DOB: 1966-04-03, 46 y.o.   MRN: [id number]  HPI  Here with 3 days acute onset fever, facial pain, pressure, general weakness and malaise, and greenish d/c, with slight ST, but little to no cough and Pt denies chest pain, increased sob or doe, wheezing, orthopnea, PND, increased LE swelling, palpitations, dizziness or syncope., but also with mild left earache, and nonprod cough. Pt denies new neurological symptoms such as new headache, or facial or extremity weakness or numbness.   Pt denies polydipsia, polyuria.  Pt states overall good compliance with meds, trying to follow lower cholesterol diet, wt overall stable but little exercise however. Has mult drug intolerances, but oral demerol/phenergan/mucinex has worked for her in the past. Does have several wks ongoing nasal allergy symptoms with clear congestion, itch and sneeze, without fever, pain, ST, cough or wheezing, but better with taking current meds Past Medical History  Diagnosis Date  . Chronic pain due to trauma   . Childhood asthma   . Acute cystitis   . Head trauma     closed  . Chronic insomnia   . Dyspepsia   . Allergic rhinitis    Past Surgical History  Procedure Date  . Repair of torn ligament right leg   . Repair of toe laceration   . Patella fracture surgery     right  . Right foot fracture   . Appendectomy   . Tonsillectomy     reports that she has never smoked. She does not have any smokeless tobacco history on file. Her alcohol and drug histories not on file. family history includes Cancer in her maternal grandmother. Allergies  Allergen Reactions  . Codeine     REACTION: rash  . Compazine   . Keppra   . Lunesta   . Moxifloxacin     REACTION: swelling and rash  . Oxcarbazepine     REACTION: immune system suppressed  . Oxycodone-Acetaminophen     REACTION: rash and vomiting  . Prochlorperazine Edisylate     REACTION: hyper and jittery  .  Propoxyphene N-Acetaminophen     REACTION: rash and vomiting  . Trileptal   . Vicodin (Hydrocodone-Acetaminophen)    Current Outpatient Prescriptions on File Prior to Visit  Medication Sig Dispense Refill  . albuterol (PROVENTIL HFA;VENTOLIN HFA) 108 (90 BASE) MCG/ACT inhaler Inhale 2 puffs into the lungs every 6 (six) hours as needed.        . baclofen (LIORESAL) 10 MG tablet Take 10 mg by mouth every 6 (six) hours as needed.        . diazepam (VALIUM) 10 MG tablet Take 10 mg by mouth every 6 (six) hours as needed.        Marland Kitchen esomeprazole (NEXIUM) 40 MG capsule Take 40 mg by mouth daily before breakfast.        . fluticasone (VERAMYST) 27.5 MCG/SPRAY nasal spray Place 1 spray into the nose daily.        . Fluticasone-Salmeterol (ADVAIR) 250-50 MCG/DOSE AEPB Inhale 1 puff into the lungs daily.        . furosemide (LASIX) 80 MG tablet Take 80 mg by mouth daily.        Marland Kitchen gabapentin (NEURONTIN) 100 MG capsule Take 1 capsule (100 mg total) by mouth 3 (three) times daily.  90 capsule  3  . ibuprofen (ADVIL,MOTRIN) 800 MG tablet Take 800 mg by mouth every 8 (eight) hours as needed.        Marland Kitchen  lidocaine (LIDODERM) 5 % Place 1 patch onto the skin as needed. Remove & Discard patch within 12 hours or as directed by MD       . meclizine (ANTIVERT) 25 MG tablet Take 25 mg by mouth 3 (three) times daily as needed.        . phenazopyridine (PYRIDIUM) 200 MG tablet Take 1 tablet (200 mg total) by mouth 3 (three) times daily as needed for pain.  20 tablet  0  . promethazine (PHENERGAN) 25 MG tablet Take 25 mg by mouth every 6 (six) hours as needed.        . pseudoephedrine (SUDAFED) 30 MG tablet Take 30 mg by mouth every 4 (four) hours as needed.        Marland Kitchen DISCONTD: meperidine (DEMEROL) 50 MG tablet Take 50 mg by mouth every 4 (four) hours as needed.         Review of Systems Review of Systems  Constitutional: Negative for diaphoresis and unexpected weight change.  HENT: Negative for drooling and tinnitus.     Eyes: Negative for photophobia and visual disturbance.  Respiratory: Negative for choking and stridor.   Gastrointestinal: Negative for vomiting and blood in stool.  Genitourinary: Negative for hematuria and decreased urine volume.    Objective:   Physical Exam BP 130/82  Pulse 87  Temp(Src) 99 F (37.2 C) (Oral)  Ht 5\' 8"  (1.727 m)  Wt 360 lb 4 oz (163.408 kg)  BMI 54.78 kg/m2  SpO2 98%  LMP 04/03/2011 Physical Exam  VS noted, mild ill Constitutional: Pt appears well-developed and well-nourished.  HENT: Head: Normocephalic.  Right Ear: External ear normal.  Left Ear: External ear normal.  Bilat tm's mild erythema.  Sinus tender bilat.  Pharynx mild erythema Eyes: Conjunctivae and EOM are normal. Pupils are equal, round, and reactive to light.  Neck: Normal range of motion. Neck supple.  Cardiovascular: Normal rate and regular rhythm.   Pulmonary/Chest: Effort normal and breath sounds normal.  Neurological: Pt is alert. No cranial nerve deficit.  Skin: Skin is warm. No erythema.  Psychiatric: Pt behavior is normal. Thought content normal. 1+ nervous    Assessment & Plan:

## 2011-05-03 ENCOUNTER — Telehealth: Payer: Self-pay | Admitting: Internal Medicine

## 2011-05-03 NOTE — Telephone Encounter (Signed)
Switch to robitussin DM. Tessalon perles 200 mg 1 po tid #30 , 1 refill Prednisone 20 mg daily x 7 days

## 2011-05-03 NOTE — Telephone Encounter (Signed)
Saw Dr. Jonny Ruiz Jan 18.  Not any better.  She coughs so hard it makes her throw up.  She has been taking her antibiotic.   She is also using Mucinex DM.   She is allergic to codene.  Can something be called in?  [facility] is her pharmacy.

## 2011-05-04 MED ORDER — BENZONATATE 200 MG PO CAPS: 200.0000 mg | ORAL_CAPSULE | Freq: Three times a day (TID) | ORAL | Status: AC | PRN

## 2011-05-04 MED ORDER — PREDNISONE 20 MG PO TABS: 20.0000 mg | ORAL_TABLET | Freq: Every day | ORAL | Status: AC

## 2011-05-04 NOTE — Telephone Encounter (Signed)
Patient's husband informed. Rx[s] sent.

## 2011-05-08 ENCOUNTER — Telehealth: Payer: Self-pay | Admitting: Internal Medicine

## 2011-05-08 DIAGNOSIS — R05 Cough: Secondary | ICD-10-CM

## 2011-05-08 DIAGNOSIS — R058 Other specified cough: Secondary | ICD-10-CM

## 2011-05-08 NOTE — Telephone Encounter (Signed)
Called patient- continue productive cough  Plan - to come in 1/30 for CBC, CXR and visit. She is told to arrive at 10:00 for lab and x-ray and visit at 12:00

## 2011-05-08 NOTE — Telephone Encounter (Signed)
The pt called and is hoping to get a refill on an antibiotic.  She states she has done a round, and needs another.  Please advise.  Thanks!!

## 2011-05-09 ENCOUNTER — Other Ambulatory Visit (INDEPENDENT_AMBULATORY_CARE_PROVIDER_SITE_OTHER): Payer: Medicare Other

## 2011-05-09 ENCOUNTER — Encounter: Payer: Self-pay | Admitting: Internal Medicine

## 2011-05-09 ENCOUNTER — Ambulatory Visit (INDEPENDENT_AMBULATORY_CARE_PROVIDER_SITE_OTHER): Payer: Medicare Other | Admitting: Internal Medicine

## 2011-05-09 ENCOUNTER — Ambulatory Visit (INDEPENDENT_AMBULATORY_CARE_PROVIDER_SITE_OTHER)
Admission: RE | Admit: 2011-05-09 | Discharge: 2011-05-09 | Disposition: A | Discharge status: Home / Self Care | Payer: Medicare Other | Source: Ambulatory Visit | Attending: Internal Medicine | Admitting: Internal Medicine

## 2011-05-09 VITALS — BP 140/96 | HR 96 | Temp 99.4°F | Resp 16 | Wt 367.5 lb

## 2011-05-09 DIAGNOSIS — R058 Other specified cough: Secondary | ICD-10-CM

## 2011-05-09 DIAGNOSIS — R059 Cough, unspecified: Secondary | ICD-10-CM

## 2011-05-09 DIAGNOSIS — R05 Cough: Secondary | ICD-10-CM

## 2011-05-09 DIAGNOSIS — J310 Chronic rhinitis: Secondary | ICD-10-CM

## 2011-05-09 LAB — CBC WITH DIFFERENTIAL/PLATELET
Basophils Absolute: 0 10*3/uL (ref 0.0–0.1)
Eosinophils Absolute: 0 10*3/uL (ref 0.0–0.7)
HCT: 36.5 % (ref 36.0–46.0)
Lymphs Abs: 1.7 10*3/uL (ref 0.7–4.0)
MCV: 87.2 fl (ref 78.0–100.0)
Monocytes Absolute: 0.3 10*3/uL (ref 0.1–1.0)
Neutrophils Relative %: 80.6 % — ABNORMAL HIGH (ref 43.0–77.0)
Platelets: 400 10*3/uL (ref 150.0–400.0)
RDW: 13.6 % (ref 11.5–14.6)
WBC: 10.9 10*3/uL — ABNORMAL HIGH (ref 4.5–10.5)

## 2011-05-13 NOTE — Progress Notes (Signed)
  Subjective:    Patient ID: [name] Wright, female    DOB: 1965/08/16, 46 y.o.   MRN: [id number]  HPI Mrs. Singleterry was recently seen Jan 18th by Dr. Jonny Ruiz and treated for URI with levaquin 500mg  qd x 10 days.She called for renewal of antibiotics due to continued drainage and congestion. She has not had any fever, rigors, HA or difficulty breathing.  I have reviewed the patient's medical history in detail and updated the computerized patient record.    Review of Systems System review is negative for any constitutional, cardiac, pulmonary, GI or neuro symptoms or complaints other than as described in the HPI.     Objective:   Physical Exam Filed Vitals:   05/09/11 1115  BP: 140/96  Pulse: 96  Temp: 99.4 F (37.4 C)  Resp: 16   Gen'l- obese white woman in no distress HEENT - no tenderness to percuss over the frontal or maxillary sinus Pulm - lungs are clear to auscultation. Cor - RRR  Clinical Data: Cough. Shortness of breath. Chest pain.  CHEST - 2 VIEW  Comparison: 02/23/2010  Findings: Technical factors related to patient body habitus reduce  diagnostic sensitivity and specificity.  Borderline cardiomegaly noted.  The lungs appear clear. No pleural effusion is observed.  IMPRESSION:  1. Borderline cardiomegaly. Otherwise, no significant abnormality  identified.  Original Report Authenticated By: Dellia Cloud, M.D.         Assessment & Plan:  Rhinitis - no fever, normal exam, normal CXR. Thus,no indication for continued antibiotics.  Plan - supportive care.

## 2011-07-02 ENCOUNTER — Ambulatory Visit (INDEPENDENT_AMBULATORY_CARE_PROVIDER_SITE_OTHER): Payer: Medicare Other | Admitting: Internal Medicine

## 2011-07-02 ENCOUNTER — Encounter: Payer: Self-pay | Admitting: Internal Medicine

## 2011-07-02 ENCOUNTER — Other Ambulatory Visit (INDEPENDENT_AMBULATORY_CARE_PROVIDER_SITE_OTHER): Payer: Medicare Other

## 2011-07-02 VITALS — BP 116/82 | HR 96 | Temp 98.3°F | Resp 16 | Wt 367.5 lb

## 2011-07-02 DIAGNOSIS — R252 Cramp and spasm: Secondary | ICD-10-CM

## 2011-07-02 DIAGNOSIS — T887XXA Unspecified adverse effect of drug or medicament, initial encounter: Secondary | ICD-10-CM

## 2011-07-02 DIAGNOSIS — J069 Acute upper respiratory infection, unspecified: Secondary | ICD-10-CM

## 2011-07-02 LAB — COMPREHENSIVE METABOLIC PANEL
ALT: 20 U/L (ref 0–35)
Albumin: 3.8 g/dL (ref 3.5–5.2)
CO2: 29 mEq/L (ref 19–32)
Calcium: 8.8 mg/dL (ref 8.4–10.5)
Chloride: 100 mEq/L (ref 96–112)
GFR: 89.02 mL/min (ref >60.00)
Glucose, Bld: 103 mg/dL — ABNORMAL HIGH (ref 70–99)
Potassium: 3.5 mEq/L (ref 3.5–5.1)
Sodium: 138 mEq/L (ref 135–145)
Total Protein: 8.1 g/dL (ref 6.0–8.3)

## 2011-07-02 MED ORDER — SULFAMETHOXAZOLE-TRIMETHOPRIM 800-160 MG PO TABS: 1.0000 | ORAL_TABLET | Freq: Two times a day (BID) | ORAL | Status: AC

## 2011-07-02 NOTE — Patient Instructions (Signed)
Minor sinus infection without signs of severe infection.  Plan - septra DS twice a day for 10 days           Robitussin DM or the generic equivalent every 4 hours for cough  Sinusitis Sinuses are air pockets within the bones of your face. The growth of bacteria within a sinus leads to infection. The infection prevents the sinuses from draining. This infection is called sinusitis. SYMPTOMS   There will be different areas of pain depending on which sinuses have become infected.  The maxillary sinuses often produce pain beneath the eyes.   Frontal sinusitis may cause pain in the middle of the forehead and above the eyes.  Other problems (symptoms) include:  Toothaches.   Colored, pus-like (purulent) drainage from the nose.   Swelling, warmth, and tenderness over the sinus areas may be signs of infection.  TREATMENT   Sinusitis is most often determined by an exam.X-rays may be taken. If x-rays have been taken, make sure you obtain your results or find out how you are to obtain them. Your caregiver may give you medications (antibiotics). These are medications that will help kill the bacteria causing the infection. You may also be given a medication (decongestant) that helps to reduce sinus swelling.   HOME CARE INSTRUCTIONS    Only take over-the-counter or prescription medicines for pain, discomfort, or fever as directed by your caregiver.   Drink extra fluids. Fluids help thin the mucus so your sinuses can drain more easily.   Applying either moist heat or ice packs to the sinus areas may help relieve discomfort.   Use saline nasal sprays to help moisten your sinuses. The sprays can be found at your local drugstore.  SEEK IMMEDIATE MEDICAL CARE IF:  You have a fever.   You have increasing pain, severe headaches, or toothache.   You have nausea, vomiting, or drowsiness.   You develop unusual swelling around the face or trouble seeing.  MAKE SURE YOU:    Understand these  instructions.   Will watch your condition.   Will get help right away if you are not doing well or get worse.  Document Released: 03/26/2005 Document Revised: 03/15/2011 Document Reviewed: 10/23/2006 [facility] Patient Information 2012 [place], [place].

## 2011-07-02 NOTE — Progress Notes (Signed)
SUBJECTIVE:  [name] Wright is a 46 y.o. female who complains of congestion, swollen glands, nasal blockage, post nasal drip, productive cough, headache and bilateral sinus pain for 5 days. She denies a history of anorexia, chest pain, dizziness and weight loss and has a history of asthma. Patient denies smoke cigarettes.   OBJECTIVE: She appears well, vital signs are as noted. Ears normal.  Throat and pharynx normal.  Neck supple. No adenopathy in the neck. Nose is congested. Sinuses non tender. The chest is clear, without wheezes or rales.  ASSESSMENT:  viral upper respiratory illness and sinusitis  PLAN: Symptomatic therapy suggested: push fluids, rest, gargle warm salt water, apply heat to sinuses prn and return office visit prn if symptoms persist or worsen. Septra DS twice a day 10 days. Call or return to clinic prn if these symptoms worsen or fail to improve as anticipated.

## 2011-07-03 LAB — MAGNESIUM: Magnesium: 1.9 mg/dL (ref 1.5–2.5)

## 2011-07-08 ENCOUNTER — Encounter: Payer: Self-pay | Admitting: Internal Medicine

## 2011-09-05 ENCOUNTER — Encounter: Payer: Self-pay | Admitting: Internal Medicine

## 2011-09-05 ENCOUNTER — Ambulatory Visit (INDEPENDENT_AMBULATORY_CARE_PROVIDER_SITE_OTHER): Payer: Medicare Other | Admitting: Internal Medicine

## 2011-09-05 VITALS — BP 110/90 | HR 91 | Temp 98.1°F | Resp 16 | Wt 365.0 lb

## 2011-09-05 DIAGNOSIS — J019 Acute sinusitis, unspecified: Secondary | ICD-10-CM

## 2011-09-05 MED ORDER — FLUTICASONE FUROATE 27.5 MCG/SPRAY NA SUSP: 1.0000 | Freq: Every day | NASAL | Status: DC

## 2011-09-05 MED ORDER — SULFAMETHOXAZOLE-TRIMETHOPRIM 800-160 MG PO TABS: 1.0000 | ORAL_TABLET | Freq: Two times a day (BID) | ORAL | Status: AC

## 2011-09-05 NOTE — Progress Notes (Signed)
  Subjective:    Patient ID: [name] Wright, female    DOB: [birth year], 46 y.o.   MRN: [id number]  HPI Ms. Zundel presents with a 2 week h/o URI symptoms with multi-colored sputum/rhinorrhea. She has not had any SOB or change in over all respiratory status. She has had low grade fever to 100 F. She has been taking ibuprofen and sudafed. She self medicated with Cipro - 250 mg qd but increased to bid x 10 days. She has been taking mucinex DM  She has been having increased flares of her allergy symptoms. She has been off treatment for 6 months due to muscle spasms and reaction. She is schedule to see her successor to Dr. Jethro Bolus.   PMH, FamHx and SocHx reviewed for any changes and relevance.   Review of Systems System review is negative for any constitutional, cardiac, pulmonary, GI or neuro symptoms or complaints other than as described in the HPI.     Objective:   Physical Exam Filed Vitals:   09/05/11 1204  BP: 110/90  Pulse: 91  Temp: 98.1 F (36.7 C)  Resp: 16   Wt Readings from Last 3 Encounters:  09/05/11 365 lb (165.563 kg)  07/02/11 367 lb 8 oz (166.697 kg)  05/09/11 367 lb 8 oz (166.697 kg)   Obese white woman in no acute distress HEENT- TMs normal, throat clear, minimal tenderness to percussion left frontal and maxillary sinus Cor- RRR Pulm - normal respiration, no rales, no wheezes Neuro - A&O x 3 normal gait and station.       Assessment & Plan:

## 2011-09-05 NOTE — Patient Instructions (Signed)
Recurrent URI - will treat with septra DS. For cough ok to take otc product of choice.

## 2011-09-06 NOTE — Assessment & Plan Note (Signed)
Recurrent sinus trouble very likely related to her chronic allergies. She has been partially suppressed with long term Cipro per dermatology.  Plan  Septra DS bid x 10 days  Schedule follow-up with allergist.

## 2011-12-11 ENCOUNTER — Other Ambulatory Visit: Payer: Self-pay | Admitting: Internal Medicine

## 2012-04-28 ENCOUNTER — Other Ambulatory Visit: Payer: Self-pay | Admitting: Internal Medicine

## 2012-06-02 DIAGNOSIS — S0990XA Unspecified injury of head, initial encounter: Secondary | ICD-10-CM | POA: Insufficient documentation

## 2012-06-02 DIAGNOSIS — F419 Anxiety disorder, unspecified: Secondary | ICD-10-CM | POA: Insufficient documentation

## 2012-06-02 DIAGNOSIS — F32A Depression, unspecified: Secondary | ICD-10-CM | POA: Insufficient documentation

## 2012-06-02 DIAGNOSIS — R519 Headache, unspecified: Secondary | ICD-10-CM | POA: Insufficient documentation

## 2012-07-10 ENCOUNTER — Ambulatory Visit (INDEPENDENT_AMBULATORY_CARE_PROVIDER_SITE_OTHER): Payer: Medicare Other | Admitting: Internal Medicine

## 2012-07-10 ENCOUNTER — Other Ambulatory Visit (INDEPENDENT_AMBULATORY_CARE_PROVIDER_SITE_OTHER): Payer: Medicare Other

## 2012-07-10 ENCOUNTER — Encounter: Payer: Self-pay | Admitting: Internal Medicine

## 2012-07-10 VITALS — BP 120/78 | HR 79 | Temp 98.0°F | Ht 68.0 in | Wt 365.0 lb

## 2012-07-10 DIAGNOSIS — R7302 Impaired glucose tolerance (oral): Secondary | ICD-10-CM | POA: Insufficient documentation

## 2012-07-10 DIAGNOSIS — R7309 Other abnormal glucose: Secondary | ICD-10-CM

## 2012-07-10 DIAGNOSIS — R5381 Other malaise: Secondary | ICD-10-CM

## 2012-07-10 DIAGNOSIS — R5383 Other fatigue: Secondary | ICD-10-CM | POA: Insufficient documentation

## 2012-07-10 DIAGNOSIS — H6690 Otitis media, unspecified, unspecified ear: Secondary | ICD-10-CM

## 2012-07-10 DIAGNOSIS — H6692 Otitis media, unspecified, left ear: Secondary | ICD-10-CM

## 2012-07-10 LAB — TSH: TSH: 3.58 u[IU]/mL (ref 0.35–5.50)

## 2012-07-10 LAB — CBC WITH DIFFERENTIAL/PLATELET
Basophils Absolute: 0.1 10*3/uL (ref 0.0–0.1)
Eosinophils Absolute: 0.2 10*3/uL (ref 0.0–0.7)
Lymphocytes Relative: 26.7 % (ref 12.0–46.0)
MCHC: 33.7 g/dL (ref 30.0–36.0)
Monocytes Relative: 6.4 % (ref 3.0–12.0)
Neutrophils Relative %: 64.5 % (ref 43.0–77.0)
Platelets: 348 10*3/uL (ref 150.0–400.0)
RDW: 13.5 % (ref 11.5–14.6)

## 2012-07-10 LAB — BASIC METABOLIC PANEL
CO2: 27 mEq/L (ref 19–32)
Chloride: 102 mEq/L (ref 96–112)
Creatinine, Ser: 0.6 mg/dL (ref 0.4–1.2)
Glucose, Bld: 107 mg/dL — ABNORMAL HIGH (ref 70–99)
Sodium: 136 mEq/L (ref 135–145)

## 2012-07-10 LAB — HEPATIC FUNCTION PANEL
ALT: 22 U/L (ref 0–35)
Albumin: 3.6 g/dL (ref 3.5–5.2)
Alkaline Phosphatase: 85 U/L (ref 39–117)
Bilirubin, Direct: 0.1 mg/dL (ref 0.0–0.3)
Total Protein: 7.4 g/dL (ref 6.0–8.3)

## 2012-07-10 LAB — HEMOGLOBIN A1C: Hgb A1c MFr Bld: 5.9 % (ref 4.6–6.5)

## 2012-07-10 MED ORDER — AZITHROMYCIN 250 MG PO TABS: ORAL_TABLET | ORAL | Status: DC

## 2012-07-10 NOTE — Assessment & Plan Note (Signed)
Mild to mod, for antibx course,  to f/u any worsening symptoms or concerns 

## 2012-07-10 NOTE — Progress Notes (Signed)
Subjective:    Patient ID: [name] Wright, female    DOB: Dec 20, 1965, 47 y.o.   MRN: [id number]  HPI   Here with 2-3 days acute onset fever, left ear pain, pressure, headache, general weakness and malaise, with mild ST and cough, but pt denies chest pain, wheezing, increased sob or doe, orthopnea, PND, increased LE swelling, palpitations, dizziness or syncope. Pt denies new neurological symptoms such as new headache, or facial or extremity weakness or numbness   Pt denies polydipsia, polyuria.  Does c/o ongoing fatigue, but denies signficant daytime hypersomnolence, and asks for at least thyroid check today. Past Medical History  Diagnosis Date  . Chronic pain due to trauma   . Childhood asthma   . Acute cystitis   . Head trauma     closed  . Chronic insomnia   . Dyspepsia   . Allergic rhinitis    Past Surgical History  Procedure Laterality Date  . Repair of torn ligament right leg    . Repair of toe laceration    . Patella fracture surgery      right  . Right foot fracture    . Appendectomy    . Tonsillectomy      reports that she has never smoked. She does not have any smokeless tobacco history on file. Her alcohol and drug histories are not on file. family history includes Cancer in her maternal grandmother. Allergies  Allergen Reactions  . Amoxicillin Rash  . Codeine     REACTION: rash  . Compazine   . Eszopiclone   . Levetiracetam   . Moxifloxacin     REACTION: swelling and rash  . Oxcarbazepine     REACTION: immune system suppressed  . Oxcarbazepine   . Oxycodone-Acetaminophen     REACTION: rash and vomiting  . Prochlorperazine Edisylate     REACTION: hyper and jittery  . Propoxyphene-Acetaminophen     REACTION: rash and vomiting  . Vicodin (Hydrocodone-Acetaminophen)    Current Outpatient Prescriptions on File Prior to Visit  Medication Sig Dispense Refill  . albuterol (PROVENTIL HFA;VENTOLIN HFA) 108 (90 BASE) MCG/ACT inhaler Inhale 2 puffs into the  lungs every 6 (six) hours as needed.        . baclofen (LIORESAL) 10 MG tablet Take 10 mg by mouth every 6 (six) hours as needed.        . diazepam (VALIUM) 10 MG tablet Take 10 mg by mouth every 6 (six) hours as needed.        Marland Kitchen esomeprazole (NEXIUM) 40 MG capsule Take 40 mg by mouth daily before breakfast.        . furosemide (LASIX) 80 MG tablet TAKE ONE TABLET BY MOUTH EVERY DAY AS NEEDED  90 tablet  2  . ibuprofen (ADVIL,MOTRIN) 800 MG tablet Take 800 mg by mouth every 8 (eight) hours as needed.        . lidocaine (LIDODERM) 5 % Place 1 patch onto the skin as needed. Remove & Discard patch within 12 hours or as directed by MD       . meclizine (ANTIVERT) 25 MG tablet Take 25 mg by mouth 3 (three) times daily as needed.        . meperidine (DEMEROL) 50 MG tablet Take 1 tablet (50 mg total) by mouth every 4 (four) hours as needed.  30 tablet  0  . pregabalin (LYRICA) 150 MG capsule Take 150 mg by mouth daily.      . promethazine (PHENERGAN) 25 MG  tablet Take 25 mg by mouth every 6 (six) hours as needed.        . pseudoephedrine (SUDAFED) 30 MG tablet Take 30 mg by mouth every 4 (four) hours as needed.        . VERAMYST 27.5 MCG/SPRAY nasal spray USE ONE DOSE EACH NOSTRIL EVERY DAY  10 g  0  . gabapentin (NEURONTIN) 100 MG capsule Take 1 capsule (100 mg total) by mouth 3 (three) times daily.  90 capsule  3   No current facility-administered medications on file prior to visit.   Review of Systems  Constitutional: Negative for unexpected weight change, or unusual diaphoresis  HENT: Negative for tinnitus.   Eyes: Negative for photophobia and visual disturbance.  Respiratory: Negative for choking and stridor.   Gastrointestinal: Negative for vomiting and blood in stool.  Genitourinary: Negative for hematuria and decreased urine volume.  Musculoskeletal: Negative for acute joint swelling Skin: Negative for color change and wound.  Neurological: Negative for tremors and numbness other than  noted  Psychiatric/Behavioral: Negative for decreased concentration or  hyperactivity.       Objective:   Physical Exam BP 120/78  Pulse 79  Temp(Src) 98 F (36.7 C) (Oral)  Ht 5\' 8"  (1.727 m)  Wt 365 lb (165.563 kg)  BMI 55.51 kg/m2  SpO2 98% VS noted, mild ill appearing Constitutional: Pt appears well-developed and well-nourished.  HENT: Head: NCAT.  Right Ear: External ear normal.  Left Ear: External ear normal. Left TM with mod erythema, no effusion  Eyes: Conjunctivae and EOM are normal. Pupils are equal, round, and reactive to light.  Neck: Normal range of motion. Neck supple.  Cardiovascular: Normal rate and regular rhythm.   Pulmonary/Chest: Effort normal and breath sounds normal.  Abd:  Soft, NT, non-distended, + BS Neurological: Pt is alert. Not confused , motor intact Skin: Skin is warm. No erythema.  Psychiatric: Pt behavior is normal. Thought content normal.     Assessment & Plan:

## 2012-07-10 NOTE — Assessment & Plan Note (Signed)
Etiology unclear, Exam otherwise benign, to check labs as documented, follow with expectant management  

## 2012-07-10 NOTE — Assessment & Plan Note (Signed)
stable overall by history and exam, recent data reviewed with pt, and pt to continue medical treatment as before,  to f/u any worsening symptoms or concerns Lab Results  Component Value Date   HGBA1C 5.9 07/10/2012

## 2012-07-10 NOTE — Patient Instructions (Signed)
Please take all new medication as prescribed Please continue all other medications as before, and refills have been done if requested. Please go to the LAB in the Basement (turn left off the elevator) for the tests to be done today You will be contacted by phone if any changes need to be made immediately.  Otherwise, you will receive a letter about your results with an explanation, but please check with MyChart first. Thank you for enrolling in MyChart. Please follow the instructions below to securely access your online medical record. MyChart allows you to send messages to your doctor, view your test results, renew your prescriptions, schedule appointments, and more. To Log into My Chart online, please go by [employer] or [employer] to [email or web address], or download the MyChart App from the [employer] of [employer] .  Your Charlyne Petrin is: [phone] (pass [phone]) Please send a practice Message on Mychart later today. Please consider f/u with Dr Debby Bud in 3-6 months

## 2012-07-17 ENCOUNTER — Encounter: Payer: Self-pay | Admitting: Internal Medicine

## 2012-07-18 ENCOUNTER — Telehealth: Payer: Self-pay

## 2012-07-18 NOTE — Telephone Encounter (Signed)
Patient informed. 

## 2012-07-18 NOTE — Telephone Encounter (Signed)
There is a refill for a second zpack already done if she wants

## 2012-07-18 NOTE — Telephone Encounter (Signed)
The patients ear is hurting again, running a fever, drainage in ear and drainage in throat..  The  Patient was seen by Dr. Jonny Ruiz on Thursday (07/10/12) and treated with a zpack. Completed zpack 2 days ago. Pharmacy is [facility]

## 2012-07-28 ENCOUNTER — Telehealth: Payer: Self-pay

## 2012-07-28 NOTE — Telephone Encounter (Signed)
Patient informed. 

## 2012-07-28 NOTE — Telephone Encounter (Signed)
The patient called this morning stating she is again having fever, ear pain and drainage. Patient was seen  On 07/10/12 with ear problems and given a second zpack which seemed to help, but back once again.  Please advise

## 2012-07-28 NOTE — Telephone Encounter (Signed)
Please consider OV with regina as this is very unusual

## 2012-07-29 ENCOUNTER — Encounter: Payer: Self-pay | Admitting: Internal Medicine

## 2012-07-29 ENCOUNTER — Ambulatory Visit (INDEPENDENT_AMBULATORY_CARE_PROVIDER_SITE_OTHER): Payer: Medicare Other | Admitting: Internal Medicine

## 2012-07-29 VITALS — BP 140/90 | HR 80 | Temp 98.1°F | Resp 16 | Wt 363.0 lb

## 2012-07-29 DIAGNOSIS — IMO0001 Reserved for inherently not codable concepts without codable children: Secondary | ICD-10-CM

## 2012-07-29 DIAGNOSIS — R030 Elevated blood-pressure reading, without diagnosis of hypertension: Secondary | ICD-10-CM

## 2012-07-29 DIAGNOSIS — J309 Allergic rhinitis, unspecified: Secondary | ICD-10-CM

## 2012-07-29 DIAGNOSIS — J019 Acute sinusitis, unspecified: Secondary | ICD-10-CM | POA: Insufficient documentation

## 2012-07-29 MED ORDER — CEFUROXIME AXETIL 500 MG PO TABS: 500.0000 mg | ORAL_TABLET | Freq: Two times a day (BID) | ORAL | Status: DC

## 2012-07-29 NOTE — Progress Notes (Signed)
Subjective:    URI      Here with 3 wks of  fever, left ear pain, pressure, headache, general weakness and malaise, with mild ST and cough, but pt denies chest pain, wheezing, increased sob or doe, orthopnea, PND, increased LE swelling, palpitations, dizziness or syncope. He took Z pac twice. Pt denies new neurological symptoms such as new headache, or facial or extremity weakness or numbness   Pt denies polydipsia, polyuria.  Does c/o ongoing fatigue, but denies signficant daytime hypersomnolence, and asks for at least thyroid check today. Past Medical History  Diagnosis Date  . Chronic pain due to trauma   . Childhood asthma   . Acute cystitis   . Head trauma     closed  . Chronic insomnia   . Dyspepsia   . Allergic rhinitis    Past Surgical History  Procedure Laterality Date  . Repair of torn ligament right leg    . Repair of toe laceration    . Patella fracture surgery      right  . Right foot fracture    . Appendectomy    . Tonsillectomy      reports that she has never smoked. She does not have any smokeless tobacco history on file. Her alcohol and drug histories are not on file. family history includes Cancer in her maternal grandmother. Allergies  Allergen Reactions  . Amoxicillin Rash  . Codeine     REACTION: rash  . Compazine   . Eszopiclone   . Levetiracetam   . Moxifloxacin     REACTION: swelling and rash  . Oxcarbazepine     REACTION: immune system suppressed  . Oxcarbazepine   . Oxycodone-Acetaminophen     REACTION: rash and vomiting  . Prochlorperazine Edisylate     REACTION: hyper and jittery  . Propoxyphene-Acetaminophen     REACTION: rash and vomiting  . Vicodin (Hydrocodone-Acetaminophen)    Current Outpatient Prescriptions on File Prior to Visit  Medication Sig Dispense Refill  . albuterol (PROVENTIL HFA;VENTOLIN HFA) 108 (90 BASE) MCG/ACT inhaler Inhale 2 puffs into the lungs every 6 (six) hours as needed.        . baclofen (LIORESAL) 10 MG  tablet Take 10 mg by mouth every 6 (six) hours as needed.        . diazepam (VALIUM) 10 MG tablet Take 10 mg by mouth every 6 (six) hours as needed.        Marland Kitchen esomeprazole (NEXIUM) 40 MG capsule Take 40 mg by mouth daily before breakfast.        . furosemide (LASIX) 80 MG tablet TAKE ONE TABLET BY MOUTH EVERY DAY AS NEEDED  90 tablet  2  . ibuprofen (ADVIL,MOTRIN) 800 MG tablet Take 800 mg by mouth every 8 (eight) hours as needed.        . lidocaine (LIDODERM) 5 % Place 1 patch onto the skin as needed. Remove & Discard patch within 12 hours or as directed by MD       . meclizine (ANTIVERT) 25 MG tablet Take 25 mg by mouth 3 (three) times daily as needed.        . meperidine (DEMEROL) 50 MG tablet Take 1 tablet (50 mg total) by mouth every 4 (four) hours as needed.  30 tablet  0  . pregabalin (LYRICA) 150 MG capsule Take 150 mg by mouth daily.      . promethazine (PHENERGAN) 25 MG tablet Take 25 mg by mouth every 6 (six)  hours as needed.        . pseudoephedrine (SUDAFED) 30 MG tablet Take 30 mg by mouth every 4 (four) hours as needed.        . VERAMYST 27.5 MCG/SPRAY nasal spray USE ONE DOSE EACH NOSTRIL EVERY DAY  10 g  0  . azithromycin (ZITHROMAX Z-PAK) 250 MG tablet Use as directed  6 each  1  . gabapentin (NEURONTIN) 100 MG capsule Take 1 capsule (100 mg total) by mouth 3 (three) times daily.  90 capsule  3   No current facility-administered medications on file prior to visit.   Review of Systems  Constitutional: Negative for unexpected weight change, or unusual diaphoresis  HENT: Negative for tinnitus.   Eyes: Negative for photophobia and visual disturbance.  Respiratory: Negative for choking and stridor.   Gastrointestinal: Negative for vomiting and blood in stool.  Genitourinary: Negative for hematuria and decreased urine volume.  Musculoskeletal: Negative for acute joint swelling Skin: Negative for color change and wound.  Neurological: Negative for tremors and numbness other than  noted  Psychiatric/Behavioral: Negative for decreased concentration or  hyperactivity.       Objective:   Physical Exam BP 140/90  Pulse 80  Temp(Src) 98.1 F (36.7 C) (Oral)  Resp 16  Wt 363 lb (164.656 kg)  BMI 55.21 kg/m2 VS noted, mild ill appearing Constitutional: Pt appears well-developed and well-nourished.  HENT: Head: NCAT. Nasal mucosa is swollen Right Ear: External ear normal.  Left Ear: External ear normal. Left TM with mild effusion  Eyes: Conjunctivae and EOM are normal. Pupils are equal, round, and reactive to light.  Neck: Normal range of motion. Neck supple.  Cardiovascular: Normal rate and regular rhythm.   Pulmonary/Chest: Effort normal and breath sounds normal.  Abd:  Soft, NT, non-distended, + BS Neurological: Pt is alert. Not confused , motor intact Skin: Skin is warm. No erythema.  Psychiatric: Pt behavior is normal. Thought content normal.     Assessment & Plan:

## 2012-07-29 NOTE — Assessment & Plan Note (Addendum)
Ceftin x 2 wks (she can take Keflex ok) Netty pot Sinus CT if not well

## 2012-07-29 NOTE — Assessment & Plan Note (Signed)
Continue with current prescription therapy as reflected on the Med list.  

## 2012-07-29 NOTE — Patient Instructions (Addendum)
Call if not better 

## 2012-07-29 NOTE — Assessment & Plan Note (Signed)
Pt states it is due to decongestants BP Readings from Last 3 Encounters:  07/29/12 140/90  07/10/12 120/78  09/05/11 110/90

## 2012-09-11 ENCOUNTER — Telehealth: Payer: Self-pay | Admitting: Internal Medicine

## 2012-09-11 NOTE — Telephone Encounter (Signed)
Patient Information:  Caller Name: Casady  Phone: [phone]  Patient: [name] Wright, [name] Wright  Gender: Female  DOB: 1965/05/05  Age: 46 Years  PCP: Illene Regulus (Adults only)  Pregnant: No  Office Follow Up:  Does the office need to follow up with this patient?: No  Instructions For The Office: N/A   Symptoms  Reason For Call & Symptoms: Right face numbness and feels hot, pain in left eye (not new), Dizzy and headache.  Head injury 2002.  Onset "last night".  Also feels like walking on unlevel floor.  Other emergent symptoms ruled. out.  She also reports left vision not as sharp as usual.  Advised 911 per Neurological Deficits protocol due to "  New neurologic deficit that is present NOW, sudden onset of ANY of the following:   Weakness of the face, arm, or leg on one side of the body  Numbness of the face, arm, or leg on one side of the body  Loss of speech or garbled speech"   Reviewed Health History In EMR: Yes  Reviewed Medications In EMR: Yes  Reviewed Allergies In EMR: Yes  Reviewed Surgeries / Procedures: Yes  Date of Onset of Symptoms: 09/10/2012  Treatments Tried: Meclizine, Sudafed  Treatments Tried Worked: No OB / GYN:  LMP: Unknown  Guideline(s) Used:  Neurologic Deficit  Disposition Per Guideline:   Call EMS 911 Now  Reason For Disposition Reached:   New neurologic deficit that is present NOW, sudden onset of ANY of the following:   Weakness of the face, arm, or leg on one side of the body  Numbness of the face, arm, or leg on one side of the body  Loss of speech or garbled speech  Advice Given:  Call Back If:  Symptoms do not go away within 30 minutes  You become worse.  Patient Will Follow Care Advice:  YES

## 2012-09-22 DIAGNOSIS — R202 Paresthesia of skin: Secondary | ICD-10-CM | POA: Insufficient documentation

## 2012-09-22 DIAGNOSIS — G4733 Obstructive sleep apnea (adult) (pediatric): Secondary | ICD-10-CM | POA: Insufficient documentation

## 2012-10-16 ENCOUNTER — Ambulatory Visit (INDEPENDENT_AMBULATORY_CARE_PROVIDER_SITE_OTHER): Payer: Medicare Other | Admitting: Internal Medicine

## 2012-10-16 ENCOUNTER — Encounter: Payer: Self-pay | Admitting: Internal Medicine

## 2012-10-16 VITALS — BP 120/80 | HR 84 | Temp 98.1°F | Ht 64.0 in | Wt 364.5 lb

## 2012-10-16 DIAGNOSIS — J341 Cyst and mucocele of nose and nasal sinus: Secondary | ICD-10-CM

## 2012-10-16 DIAGNOSIS — J019 Acute sinusitis, unspecified: Secondary | ICD-10-CM

## 2012-10-16 DIAGNOSIS — R030 Elevated blood-pressure reading, without diagnosis of hypertension: Secondary | ICD-10-CM

## 2012-10-16 DIAGNOSIS — J309 Allergic rhinitis, unspecified: Secondary | ICD-10-CM

## 2012-10-16 DIAGNOSIS — J3489 Other specified disorders of nose and nasal sinuses: Secondary | ICD-10-CM

## 2012-10-16 DIAGNOSIS — IMO0001 Reserved for inherently not codable concepts without codable children: Secondary | ICD-10-CM

## 2012-10-16 MED ORDER — CEFUROXIME AXETIL 250 MG PO TABS: 250.0000 mg | ORAL_TABLET | Freq: Two times a day (BID) | ORAL | Status: DC

## 2012-10-16 MED ORDER — MOMETASONE FUROATE 50 MCG/ACT NA SUSP: 2.0000 | Freq: Every day | NASAL | Status: DC

## 2012-10-16 NOTE — Assessment & Plan Note (Signed)
Mild to mod, for antibx course,  to f/u any worsening symptoms or concerns 

## 2012-10-16 NOTE — Progress Notes (Signed)
Subjective:    Patient ID: [name] Wright, female    DOB: Apr 19, 1965, 47 y.o.   MRN: [id number]  HPI   Here with 2-3 days acute onset fever, facial pain, pressure, headache, general weakness and malaise, and greenish d/c, with mild ST and cough, but pt denies chest pain, wheezing, increased sob or doe, orthopnea, PND, increased LE swelling, palpitations, dizziness or syncope. Has known chronic sinusitis, recently found left sinus cyst, not better with 2 wks cipro per ENT., Pt denies chest pain, increased sob or doe, wheezing, orthopnea, PND, increased LE swelling, palpitations, dizziness or syncope. Has long hx of asthma/allergy, has not seen allergist since Dr Corinda Gubler retired Past Medical History  Diagnosis Date  . Chronic pain due to trauma   . Childhood asthma   . Acute cystitis   . Head trauma     closed  . Chronic insomnia   . Dyspepsia   . Allergic rhinitis    Past Surgical History  Procedure Laterality Date  . Repair of torn ligament right leg    . Repair of toe laceration    . Patella fracture surgery      right  . Right foot fracture    . Appendectomy    . Tonsillectomy      reports that she has never smoked. She does not have any smokeless tobacco history on file. Her alcohol and drug histories are not on file. family history includes Cancer in her maternal grandmother. Allergies  Allergen Reactions  . Amoxicillin Rash  . Codeine     REACTION: rash  . Compazine   . Eszopiclone   . Levetiracetam   . Moxifloxacin     REACTION: swelling and rash  . Oxcarbazepine     REACTION: immune system suppressed  . Oxcarbazepine   . Oxycodone-Acetaminophen     REACTION: rash and vomiting  . Prochlorperazine Edisylate     REACTION: hyper and jittery  . Propoxyphene-Acetaminophen     REACTION: rash and vomiting  . Vicodin (Hydrocodone-Acetaminophen)    Current Outpatient Prescriptions on File Prior to Visit  Medication Sig Dispense Refill  . albuterol (PROVENTIL  HFA;VENTOLIN HFA) 108 (90 BASE) MCG/ACT inhaler Inhale 2 puffs into the lungs every 6 (six) hours as needed.        . baclofen (LIORESAL) 10 MG tablet Take 10 mg by mouth every 6 (six) hours as needed.        . diazepam (VALIUM) 10 MG tablet Take 10 mg by mouth every 6 (six) hours as needed.        Marland Kitchen esomeprazole (NEXIUM) 40 MG capsule Take 40 mg by mouth daily before breakfast.        . furosemide (LASIX) 80 MG tablet TAKE ONE TABLET BY MOUTH EVERY DAY AS NEEDED  90 tablet  2  . ibuprofen (ADVIL,MOTRIN) 800 MG tablet Take 800 mg by mouth every 8 (eight) hours as needed.        . lidocaine (LIDODERM) 5 % Place 1 patch onto the skin as needed. Remove & Discard patch within 12 hours or as directed by MD       . meclizine (ANTIVERT) 25 MG tablet Take 25 mg by mouth 3 (three) times daily as needed.        . meperidine (DEMEROL) 50 MG tablet Take 1 tablet (50 mg total) by mouth every 4 (four) hours as needed.  30 tablet  0  . pregabalin (LYRICA) 150 MG capsule Take 150 mg by mouth daily.      Marland Kitchen  promethazine (PHENERGAN) 25 MG tablet Take 25 mg by mouth every 6 (six) hours as needed.        . pseudoephedrine (SUDAFED) 30 MG tablet Take 30 mg by mouth every 4 (four) hours as needed.        . VERAMYST 27.5 MCG/SPRAY nasal spray USE ONE DOSE EACH NOSTRIL EVERY DAY  10 g  0  . cefUROXime (CEFTIN) 500 MG tablet Take 1 tablet (500 mg total) by mouth 2 (two) times daily.  28 tablet  0  . gabapentin (NEURONTIN) 100 MG capsule Take 1 capsule (100 mg total) by mouth 3 (three) times daily.  90 capsule  3   No current facility-administered medications on file prior to visit.   Review of Systems  Constitutional: Negative for unexpected weight change, or unusual diaphoresis  HENT: Negative for tinnitus.   Eyes: Negative for photophobia and visual disturbance.  Respiratory: Negative for choking and stridor.   Gastrointestinal: Negative for vomiting and blood in stool.  Genitourinary: Negative for hematuria and  decreased urine volume.  Musculoskeletal: Negative for acute joint swelling Skin: Negative for color change and wound.  Neurological: Negative for tremors and numbness other than noted  Psychiatric/Behavioral: Negative for decreased concentration or  hyperactivity.       Objective:   Physical Exam BP 120/80  Pulse 84  Temp(Src) 98.1 F (36.7 C) (Oral)  Ht 5\' 4"  (1.626 m)  Wt 364 lb 8 oz (165.336 kg)  BMI 62.54 kg/m2  SpO2 91% VS noted, mild ill  Constitutional: Pt appears well-developed and well-nourished.  HENT: Head: NCAT.  Right Ear: External ear normal.  Left Ear: External ear normal.  Bilat tm's with mild erythema.  Max sinus areas mild to mod tender, left > right.  Pharynx with mild erythema, no exudate Eyes: Conjunctivae and EOM are normal Left upper lid with swelling, mild tender. Pupils are equal, round, and reactive to light.  Neck: Normal range of motion. Neck supple.  Cardiovascular: Normal rate and regular rhythm.   Pulmonary/Chest: Effort normal and breath sounds normal.  Neurological: Pt is alert. Not confused  Skin: Skin is warm. No erythema.  Psychiatric: Pt behavior is normal. Thought content normal.        Assessment & Plan:

## 2012-10-16 NOTE — Assessment & Plan Note (Signed)
Now improved, cont to follow BP Readings from Last 3 Encounters:  10/16/12 120/80  07/29/12 140/90  07/10/12 120/78

## 2012-10-16 NOTE — Patient Instructions (Signed)
Please take all new medication as prescribed - the antibiotic Please also try the nasonex at 2 spray/side per day(a prescription is also sent) Please continue all other medications as before, and refills have been done if requested. You will be contacted regarding the referral for: ENT  Please remember to sign up for My Chart if you have not done so, as this will be important to you in the future with finding out test results, communicating by private email, and scheduling acute appointments online when needed.

## 2012-10-16 NOTE — Assessment & Plan Note (Signed)
Ok to add nasonex, gave sample, consider allergy referral

## 2012-10-16 NOTE — Assessment & Plan Note (Signed)
Per recent MRI , pt has disc with her, for ENT referral for second opinion

## 2013-02-12 ENCOUNTER — Other Ambulatory Visit: Payer: Self-pay

## 2013-09-10 ENCOUNTER — Emergency Department (HOSPITAL_COMMUNITY)
Admission: EM | Admit: 2013-09-10 | Discharge: 2013-09-10 | Disposition: A | Discharge status: Home / Self Care | Payer: Medicare Other | Attending: Emergency Medicine | Admitting: Emergency Medicine

## 2013-09-10 ENCOUNTER — Encounter (HOSPITAL_COMMUNITY): Payer: Self-pay | Admitting: Emergency Medicine

## 2013-09-10 DIAGNOSIS — G894 Chronic pain syndrome: Secondary | ICD-10-CM | POA: Insufficient documentation

## 2013-09-10 DIAGNOSIS — Z880 Allergy status to penicillin: Secondary | ICD-10-CM | POA: Insufficient documentation

## 2013-09-10 DIAGNOSIS — Z79899 Other long term (current) drug therapy: Secondary | ICD-10-CM | POA: Insufficient documentation

## 2013-09-10 DIAGNOSIS — N12 Tubulo-interstitial nephritis, not specified as acute or chronic: Secondary | ICD-10-CM | POA: Insufficient documentation

## 2013-09-10 DIAGNOSIS — Z3202 Encounter for pregnancy test, result negative: Secondary | ICD-10-CM | POA: Insufficient documentation

## 2013-09-10 LAB — COMPREHENSIVE METABOLIC PANEL
ALBUMIN: 3.5 g/dL (ref 3.5–5.2)
ALK PHOS: 85 U/L (ref 39–117)
ALT: 18 U/L (ref 0–35)
AST: 23 U/L (ref 0–37)
BUN: 16 mg/dL (ref 6–23)
CHLORIDE: 102 meq/L (ref 96–112)
CO2: 23 mEq/L (ref 19–32)
CREATININE: 0.77 mg/dL (ref 0.50–1.10)
Calcium: 9 mg/dL (ref 8.4–10.5)
GFR calc Af Amer: >90 mL/min (ref >90)
GFR calc non Af Amer: >90 mL/min (ref >90)
Glucose, Bld: 140 mg/dL — ABNORMAL HIGH (ref 70–99)
POTASSIUM: 4.2 meq/L (ref 3.7–5.3)
Sodium: 138 mEq/L (ref 137–147)
Total Bilirubin: 0.4 mg/dL (ref 0.3–1.2)
Total Protein: 7.4 g/dL (ref 6.0–8.3)

## 2013-09-10 LAB — URINALYSIS, ROUTINE W REFLEX MICROSCOPIC
Bilirubin Urine: NEGATIVE
Glucose, UA: NEGATIVE mg/dL
Ketones, ur: NEGATIVE mg/dL
Nitrite: POSITIVE — AB
Protein, ur: NEGATIVE mg/dL
Specific Gravity, Urine: 1.011 (ref 1.005–1.030)
Urobilinogen, UA: 1 mg/dL (ref 0.0–1.0)
pH: 6 (ref 5.0–8.0)

## 2013-09-10 LAB — CBC WITH DIFFERENTIAL/PLATELET
Basophils Absolute: 0.1 10*3/uL (ref 0.0–0.1)
Basophils Relative: 0 % (ref 0–1)
EOS PCT: 1 % (ref 0–5)
Eosinophils Absolute: 0.1 10*3/uL (ref 0.0–0.7)
HCT: 37.2 % (ref 36.0–46.0)
Hemoglobin: 12.4 g/dL (ref 12.0–15.0)
LYMPHS ABS: 2.1 10*3/uL (ref 0.7–4.0)
Lymphocytes Relative: 16 % (ref 12–46)
MCH: 29 pg (ref 26.0–34.0)
MCHC: 33.3 g/dL (ref 30.0–36.0)
MCV: 87.1 fL (ref 78.0–100.0)
MONO ABS: 1 10*3/uL (ref 0.1–1.0)
Monocytes Relative: 8 % (ref 3–12)
Neutro Abs: 9.7 10*3/uL — ABNORMAL HIGH (ref 1.7–7.7)
Neutrophils Relative %: 75 % (ref 43–77)
Platelets: 286 10*3/uL (ref 150–400)
RBC: 4.27 MIL/uL (ref 3.87–5.11)
RDW: 12.7 % (ref 11.5–15.5)
WBC: 12.9 10*3/uL — ABNORMAL HIGH (ref 4.0–10.5)

## 2013-09-10 LAB — PREGNANCY, URINE: PREG TEST UR: NEGATIVE

## 2013-09-10 LAB — URINE MICROSCOPIC-ADD ON

## 2013-09-10 LAB — LIPASE, BLOOD: LIPASE: 14 U/L (ref 11–59)

## 2013-09-10 MED ORDER — PROMETHAZINE HCL 25 MG PO TABS: 25.0000 mg | ORAL_TABLET | Freq: Four times a day (QID) | ORAL | Status: DC | PRN

## 2013-09-10 MED ORDER — DEXTROSE 5 % IV SOLN
1.0000 g | Freq: Once | INTRAVENOUS | Status: AC
  Administered 2013-09-10: 1 g via INTRAVENOUS
  Filled 2013-09-10: qty 10

## 2013-09-10 MED ORDER — PHENAZOPYRIDINE HCL 200 MG PO TABS: 200.0000 mg | ORAL_TABLET | Freq: Three times a day (TID) | ORAL | Status: DC

## 2013-09-10 MED ORDER — TRAMADOL HCL 50 MG PO TABS: 50.0000 mg | ORAL_TABLET | Freq: Four times a day (QID) | ORAL | Status: DC | PRN

## 2013-09-10 MED ORDER — SODIUM CHLORIDE 0.9 % IV BOLUS (SEPSIS)
1000.0000 mL | Freq: Once | INTRAVENOUS | Status: AC
  Administered 2013-09-10: 1000 mL via INTRAVENOUS

## 2013-09-10 MED ORDER — MORPHINE SULFATE 4 MG/ML IJ SOLN
4.0000 mg | Freq: Once | INTRAMUSCULAR | Status: AC
  Administered 2013-09-10: 4 mg via INTRAVENOUS
  Filled 2013-09-10: qty 1

## 2013-09-10 MED ORDER — ONDANSETRON HCL 4 MG/2ML IJ SOLN
4.0000 mg | Freq: Once | INTRAMUSCULAR | Status: AC
  Administered 2013-09-10: 4 mg via INTRAVENOUS
  Filled 2013-09-10: qty 2

## 2013-09-10 MED ORDER — SULFAMETHOXAZOLE-TRIMETHOPRIM 800-160 MG PO TABS: 1.0000 | ORAL_TABLET | Freq: Two times a day (BID) | ORAL | Status: DC

## 2013-09-10 NOTE — ED Notes (Signed)
Patient states she has had urinary frequency, burning and pain last week and she took over the counter medication with no relief. She has multiple complaints including back, abdominal and bladder spasms. She felt nauseated with chills and pain tonight.

## 2013-09-10 NOTE — ED Notes (Signed)
Asked pt if she was able to urinate, pt is unable too at this moment, stated to come back in 15 min

## 2013-09-10 NOTE — ED Provider Notes (Signed)
Medical screening examination/treatment/procedure(s) were performed by non-physician practitioner and as supervising physician I was immediately available for consultation/collaboration.   EKG Interpretation None        Wandra Arthurs, MD 09/10/13 [phone]

## 2013-09-10 NOTE — ED Provider Notes (Signed)
Medical screening examination/treatment/procedure(s) were performed by non-physician practitioner and as supervising physician I was immediately available for consultation/collaboration.   EKG Interpretation None        Hoy Morn, MD 09/10/13 0730

## 2013-09-10 NOTE — ED Notes (Signed)
Bed: YV85 Expected date: 09/10/13 Expected time: 4:40 AM Means of arrival: Ambulance Comments: Painful urination/back pain

## 2013-09-10 NOTE — ED Notes (Signed)
Lab draw delayed due to poor venous access. IV team has been notified and is in route.

## 2013-09-10 NOTE — ED Provider Notes (Signed)
Patient hand off to myself by Maylene Roes, PA-C  Patient has most likely pyelo and is waiting for IV sccess for 1 g IV Rocephin and CBC, CMP and Lipase to result.  Results for orders placed during the hospital encounter of 09/10/13  URINALYSIS, ROUTINE W REFLEX MICROSCOPIC      Result Value Ref Range   Color, Urine ORANGE (*) YELLOW   APPearance CLOUDY (*) CLEAR   Specific Gravity, Urine 1.011  1.005 - 1.030   pH 6.0  5.0 - 8.0   Glucose, UA NEGATIVE  NEGATIVE mg/dL   Hgb urine dipstick MODERATE (*) NEGATIVE   Bilirubin Urine NEGATIVE  NEGATIVE   Ketones, ur NEGATIVE  NEGATIVE mg/dL   Protein, ur NEGATIVE  NEGATIVE mg/dL   Urobilinogen, UA 1.0  0.0 - 1.0 mg/dL   Nitrite POSITIVE (*) NEGATIVE   Leukocytes, UA TRACE (*) NEGATIVE  PREGNANCY, URINE      Result Value Ref Range   Preg Test, Ur NEGATIVE  NEGATIVE  COMPREHENSIVE METABOLIC PANEL      Result Value Ref Range   Sodium 138  137 - 147 mEq/L   Potassium 4.2  3.7 - 5.3 mEq/L   Chloride 102  96 - 112 mEq/L   CO2 23  19 - 32 mEq/L   Glucose, Bld 140 (*) 70 - 99 mg/dL   BUN 16  6 - 23 mg/dL   Creatinine, Ser 0.77  0.50 - 1.10 mg/dL   Calcium 9.0  8.4 - 10.5 mg/dL   Total Protein 7.4  6.0 - 8.3 g/dL   Albumin 3.5  3.5 - 5.2 g/dL   AST 23  0 - 37 U/L   ALT 18  0 - 35 U/L   Alkaline Phosphatase 85  39 - 117 U/L   Total Bilirubin 0.4  0.3 - 1.2 mg/dL   GFR calc non Af Amer >90  >90 mL/min   GFR calc Af Amer >90  >90 mL/min  CBC WITH DIFFERENTIAL      Result Value Ref Range   WBC 12.9 (*) 4.0 - 10.5 K/uL   RBC 4.27  3.87 - 5.11 MIL/uL   Hemoglobin 12.4  12.0 - 15.0 g/dL   HCT 37.2  36.0 - 46.0 %   MCV 87.1  78.0 - 100.0 fL   MCH 29.0  26.0 - 34.0 pg   MCHC 33.3  30.0 - 36.0 g/dL   RDW 12.7  11.5 - 15.5 %   Platelets 286  150 - 400 K/uL   Neutrophils Relative % 75  43 - 77 %   Neutro Abs 9.7 (*) 1.7 - 7.7 K/uL   Lymphocytes Relative 16  12 - 46 %   Lymphs Abs 2.1  0.7 - 4.0 K/uL   Monocytes Relative 8  3 - 12 %   Monocytes Absolute 1.0  0.1 - 1.0 K/uL   Eosinophils Relative 1  0 - 5 %   Eosinophils Absolute 0.1  0.0 - 0.7 K/uL   Basophils Relative 0  0 - 1 %   Basophils Absolute 0.1  0.0 - 0.1 K/uL  LIPASE, BLOOD      Result Value Ref Range   Lipase 14  11 - 59 U/L  URINE MICROSCOPIC-ADD ON      Result Value Ref Range   WBC, UA 0-2  <3 WBC/hpf   RBC / HPF 7-10  <3 RBC/hpf   No results found.  The patient has received her antibiotics and fluids, she is feeling  much better with no vomiting. Has tolerated PO in the ED. NO fever, stable vital signs and comfortable with going home.  Rx:  Pt has many medication allergies  -----Will prescribe bactrim, Pyridium, Ultram and Phenergan  48 y.o.[name] Wright's evaluation in the Emergency Department is complete. It has been determined that no acute conditions requiring further emergency intervention are present at this time. The patient/guardian have been advised of the diagnosis and plan. We have discussed signs and symptoms that warrant return to the ED, such as changes or worsening in symptoms.  Vital signs are stable at discharge. Filed Vitals:   09/10/13 0506  BP: 156/83  Pulse: 88  Temp: 98.2 F (36.8 C)  Resp: 15    Patient/guardian has voiced understanding and agreed to follow-up with the PCP or specialist.     Linus Mako, PA-C 09/10/13 [id number]  Linus Mako, PA-C 09/10/13 0175

## 2013-09-10 NOTE — Discharge Instructions (Signed)

## 2013-09-10 NOTE — ED Notes (Signed)
Three attempts made to start IV on patient by 2 clinicians.

## 2013-09-10 NOTE — ED Provider Notes (Signed)
CSN: [id number]     Arrival date & time 09/10/13  0456 History   First MD Initiated Contact with Patient 09/10/13 [phone]     Chief Complaint  Patient presents with  . Urinary Tract Infection  . Back Pain     (Consider location/radiation/quality/duration/timing/severity/associated sxs/prior Treatment) HPI Comments: Patient is a 3 female past medical history significant for chronic pain, insomnia searched for one week of gradually worsening suprapubic discomfort. Patient describes it as spasms. She states last evening she developed more constant pain in the suprapubic region along with left sided flank pain. She states she developed chills but no fever and also had some nausea. She states she awoke around 3 AM and developed multiple episodes of nonbloody emesis that was not alleviated with her at home nausea medication. No alleviating factors. Attempted to take pyridium without improvement. Abdominal surgical history includes appendectomy.   Patient is a 48 y.o. female presenting with urinary tract infection and back pain.  Urinary Tract Infection Associated symptoms include abdominal pain, chills, nausea and vomiting. Pertinent negatives include no fever.  Back Pain Associated symptoms: abdominal pain   Associated symptoms: no dysuria, no fever and no pelvic pain     Past Medical History  Diagnosis Date  . Chronic pain due to trauma   . Childhood asthma   . Acute cystitis   . Head trauma     closed  . Chronic insomnia   . Dyspepsia   . Allergic rhinitis    Past Surgical History  Procedure Laterality Date  . Repair of torn ligament right leg    . Repair of toe laceration    . Patella fracture surgery      right  . Right foot fracture    . Appendectomy    . Tonsillectomy     Family History  Problem Relation Age of Onset  . Cancer Maternal Grandmother     ovarion   History  Substance Use Topics  . Smoking status: Never Smoker   . Smokeless tobacco: Not on file  . Alcohol  Use: Not on file   OB History   Grav Para Term Preterm Abortions TAB SAB Ect Mult Living                 Review of Systems  Constitutional: Positive for chills. Negative for fever.  Gastrointestinal: Positive for nausea, vomiting and abdominal pain.  Genitourinary: Positive for urgency, frequency, flank pain and decreased urine volume. Negative for dysuria, hematuria, vaginal bleeding, vaginal discharge and pelvic pain.      Allergies  Amoxicillin; Codeine; Compazine; Eszopiclone; Levetiracetam; Moxifloxacin; Oxcarbazepine; Oxcarbazepine; Oxycodone-acetaminophen; Prochlorperazine edisylate; Propoxyphene n-acetaminophen; and Vicodin  Home Medications   Prior to Admission medications   Medication Sig Start Date End Date Taking? Authorizing Provider  albuterol (PROVENTIL HFA;VENTOLIN HFA) 108 (90 BASE) MCG/ACT inhaler Inhale 2 puffs into the lungs every 6 (six) hours as needed.      Historical Provider, MD  baclofen (LIORESAL) 10 MG tablet Take 10 mg by mouth every 6 (six) hours as needed.      Historical Provider, MD  cefUROXime (CEFTIN) 250 MG tablet Take 1 tablet (250 mg total) by mouth 2 (two) times daily. 10/16/12   Biagio Borg, MD  diazepam (VALIUM) 10 MG tablet Take 10 mg by mouth every 6 (six) hours as needed.      Historical Provider, MD  esomeprazole (NEXIUM) 40 MG capsule Take 40 mg by mouth daily before breakfast.      Historical  Provider, MD  furosemide (LASIX) 80 MG tablet TAKE ONE TABLET BY MOUTH EVERY DAY AS NEEDED 12/11/11   Neena Rhymes, MD  gabapentin (NEURONTIN) 100 MG capsule Take 1 capsule (100 mg total) by mouth 3 (three) times daily. 04/12/11 04/11/12  Neena Rhymes, MD  ibuprofen (ADVIL,MOTRIN) 800 MG tablet Take 800 mg by mouth every 8 (eight) hours as needed.      Historical Provider, MD  lidocaine (LIDODERM) 5 % Place 1 patch onto the skin as needed. Remove & Discard patch within 12 hours or as directed by MD     Historical Provider, MD  meclizine (ANTIVERT)  25 MG tablet Take 25 mg by mouth 3 (three) times daily as needed.      Historical Provider, MD  meperidine (DEMEROL) 50 MG tablet Take 1 tablet (50 mg total) by mouth every 4 (four) hours as needed. 04/27/11   Biagio Borg, MD  mometasone (NASONEX) 50 MCG/ACT nasal spray Place 2 sprays into the nose daily. 10/16/12   Biagio Borg, MD  pregabalin (LYRICA) 150 MG capsule Take 150 mg by mouth daily.    Historical Provider, MD  promethazine (PHENERGAN) 25 MG tablet Take 25 mg by mouth every 6 (six) hours as needed.      Historical Provider, MD  pseudoephedrine (SUDAFED) 30 MG tablet Take 30 mg by mouth every 4 (four) hours as needed.      Historical Provider, MD  VERAMYST 27.5 MCG/SPRAY nasal spray USE ONE DOSE EACH NOSTRIL EVERY DAY 04/28/12   Neena Rhymes, MD   BP 156/83  Pulse 88  Temp(Src) 98.2 F (36.8 C) (Oral)  Resp 15  SpO2 99% Physical Exam  Nursing note and vitals reviewed. Constitutional: She is oriented to person, place, and time. She appears well-developed and well-nourished. No distress.  HENT:  Head: Normocephalic and atraumatic.  Right Ear: External ear normal.  Left Ear: External ear normal.  Nose: Nose normal.  Mouth/Throat: Oropharynx is clear and moist.  Eyes: Conjunctivae are normal.  Neck: Normal range of motion. Neck supple.  Cardiovascular: Normal rate, regular rhythm and normal heart sounds.   Pulmonary/Chest: Effort normal and breath sounds normal. No respiratory distress.  Abdominal: Soft. Bowel sounds are normal. She exhibits no distension. There is tenderness in the suprapubic area. There is CVA tenderness (left). There is no rigidity, no rebound and no guarding.  Musculoskeletal: Normal range of motion.  Neurological: She is alert and oriented to person, place, and time.  Skin: Skin is warm and dry. She is not diaphoretic.  Psychiatric: She has a normal mood and affect.    ED Course  Procedures (including critical care time) Medications  sodium chloride  0.9 % bolus 1,000 mL (not administered)  ondansetron (ZOFRAN) injection 4 mg (not administered)  morphine 4 MG/ML injection 4 mg (not administered)  cefTRIAXone (ROCEPHIN) 1 g in dextrose 5 % 50 mL IVPB (not administered)    Labs Review Labs Reviewed - No data to display  Imaging Review No results found.   EKG Interpretation None      MDM   Final diagnoses:  None    Filed Vitals:   09/10/13 0506  BP: 156/83  Pulse: 88  Temp: 98.2 F (36.8 C)  Resp: 15   Afebrile, NAD, non-toxic appearing, AAOx4.  Abdomen soft, mildly tender in suprapubic region, no peritoneal signs. No CVA tenderness noted. Plan to treat with IV fluids, pain medication, antiemetics. Will obtain blood work and a urine. Symptoms concerning  for pyelonephritis. UA with leukocytes and nitrate positive will give Rocephin. Will sign out to Delos Haring, PA-C for followup and reevaluation of patient.  Harlow Mares, PA-C 09/10/13 [phone]

## 2013-09-17 ENCOUNTER — Ambulatory Visit (INDEPENDENT_AMBULATORY_CARE_PROVIDER_SITE_OTHER): Payer: Medicare Other | Admitting: Internal Medicine

## 2013-09-17 ENCOUNTER — Other Ambulatory Visit (INDEPENDENT_AMBULATORY_CARE_PROVIDER_SITE_OTHER): Payer: Medicare Other

## 2013-09-17 ENCOUNTER — Encounter: Payer: Self-pay | Admitting: Internal Medicine

## 2013-09-17 VITALS — BP 132/80 | HR 105 | Temp 98.6°F | Wt 368.0 lb

## 2013-09-17 DIAGNOSIS — M549 Dorsalgia, unspecified: Secondary | ICD-10-CM

## 2013-09-17 DIAGNOSIS — N390 Urinary tract infection, site not specified: Secondary | ICD-10-CM

## 2013-09-17 DIAGNOSIS — R32 Unspecified urinary incontinence: Secondary | ICD-10-CM

## 2013-09-17 NOTE — Progress Notes (Signed)
Subjective:    Patient ID: [name] Wright, female    DOB: 1965-12-25, 48 y.o.   MRN: [id number]  HPI  Here to f/u; hx begins with 1 wk episode of what sounds like acute viral illness with n/v/diarrhea 2 wks ago; better with phenergan prn and resolved, then developed urinary pain/urgency, f/c, seen in ER June 4, dx clinically with uti/pyelonephritis apparently despite no increased WBC on UA, had low back pain/left flank area pain in retrospect ? Related to recent freq n/v prior? Marland KitchenNo hx of discitis or chronic back pain.  Did have microhematuria mild on Urinalysis.  Pt clearly denies menses at that time. No hx of renal stone.  Urine cx not obtained.  Pt self tx with uristat otc with equivocal results.  During all of this pt has been on cipro 500 qd per dermatology  Presents today essentially no change except all symptoms less severe, now mild, but persistent.  Does have ongoing urinary incont, mild but persistent as well, requests urology referral Past Medical History  Diagnosis Date  . Chronic pain due to trauma   . Childhood asthma   . Acute cystitis   . Head trauma     closed  . Chronic insomnia   . Dyspepsia   . Allergic rhinitis    Past Surgical History  Procedure Laterality Date  . Repair of torn ligament right leg    . Repair of toe laceration    . Patella fracture surgery      right  . Right foot fracture    . Appendectomy    . Tonsillectomy      reports that she has never smoked. She does not have any smokeless tobacco history on file. She reports that she does not drink alcohol. Her drug history is not on file. family history includes Cancer in her maternal grandmother. Allergies  Allergen Reactions  . Amoxicillin Rash  . Codeine     REACTION: rash  . Compazine Other (See Comments)    Hyper/jitters/blotches  . Eszopiclone Other (See Comments)    Metallic taste  . Levetiracetam     Jitters/hyper  . Moxifloxacin     REACTION: swelling and rash  . Oxcarbazepine       REACTION: immune system suppressed  . Oxcarbazepine   . Oxycodone-Acetaminophen     REACTION: rash and vomiting  . Prochlorperazine Edisylate     REACTION: hyper and jittery  . Propoxyphene N-Acetaminophen     REACTION: rash and vomiting  . Vicodin [Hydrocodone-Acetaminophen] Rash   Current Outpatient Prescriptions on File Prior to Visit  Medication Sig Dispense Refill  . baclofen (LIORESAL) 10 MG tablet Take 10 mg by mouth every 6 (six) hours as needed for muscle spasms.       . cetirizine (ZYRTEC) 10 MG tablet Take 10 mg by mouth daily.      . ciprofloxacin (CIPRO) 500 MG tablet Take 500 mg by mouth daily.      . diazepam (VALIUM) 10 MG tablet Take 10 mg by mouth every 6 (six) hours as needed for anxiety.       Marland Kitchen esomeprazole (NEXIUM) 40 MG capsule Take 40 mg by mouth daily as needed.       . furosemide (LASIX) 80 MG tablet Take 80 mg by mouth daily as needed for fluid.      Marland Kitchen ibuprofen (ADVIL,MOTRIN) 800 MG tablet Take 800 mg by mouth every 8 (eight) hours as needed (for pain).       Marland Kitchen  lidocaine (LIDODERM) 5 % Place 1 patch onto the skin as needed. Remove & Discard patch within 12 hours or as directed by MD       . meclizine (ANTIVERT) 25 MG tablet Take 25 mg by mouth 3 (three) times daily as needed.        . montelukast (SINGULAIR) 10 MG tablet Take 10 mg by mouth at bedtime.      Marland Kitchen morphine (MSIR) 15 MG tablet Take 15 mg by mouth every 6 (six) hours as needed for severe pain (for pain).      . phenazopyridine (PYRIDIUM) 200 MG tablet Take 1 tablet (200 mg total) by mouth 3 (three) times daily.  6 tablet  0  . promethazine (PHENERGAN) 25 MG tablet Take 25 mg by mouth every 6 (six) hours as needed (for nausea).       . promethazine (PHENERGAN) 25 MG tablet Take 1 tablet (25 mg total) by mouth every 6 (six) hours as needed for nausea or vomiting.  30 tablet  0  . pseudoephedrine (SUDAFED) 30 MG tablet Take 30 mg by mouth every 4 (four) hours as needed for congestion.       .  sulfamethoxazole-trimethoprim (SEPTRA DS) 800-160 MG per tablet Take 1 tablet by mouth every 12 (twelve) hours.  24 tablet  0  . traMADol (ULTRAM) 50 MG tablet Take 1 tablet (50 mg total) by mouth every 6 (six) hours as needed.  15 tablet  0  . gabapentin (NEURONTIN) 100 MG capsule Take 1 capsule (100 mg total) by mouth 3 (three) times daily.  90 capsule  3   No current facility-administered medications on file prior to visit.   Review of Systems  Constitutional: Negative for unusual diaphoresis or other sweats  HENT: Negative for ringing in ear Eyes: Negative for double vision or worsening visual disturbance.  Respiratory: Negative for choking and stridor.   Gastrointestinal: Negative for vomiting or other signifcant bowel change Genitourinary: Negative for hematuria or decreased urine volume.  Musculoskeletal: Negative for other MSK pain or swelling Skin: Negative for color change and worsening wound.  Neurological: Negative for tremors and numbness other than noted  Psychiatric/Behavioral: Negative for decreased concentration or agitation other than above       Objective:   Physical Exam BP 132/80  Pulse 105  Temp(Src) 98.6 F (37 C) (Oral)  Wt 368 lb (166.924 kg)  SpO2 99%  LMP 08/16/2013 VS noted,  Constitutional: Pt appears well-developed, well-nourished.  HENT: Head: NCAT.  Right Ear: External ear normal.  Left Ear: External ear normal.  Eyes: . Pupils are equal, round, and reactive to light. Conjunctivae and EOM are normal Neck: Normal range of motion. Neck supple.  Cardiovascular: Normal rate and regular rhythm.   Pulmonary/Chest: Effort normal and breath sounds normal.  Abd:  Soft, NT, ND, + BS, has mild tender to low thoracic/upper lumbar midline spinal, also left low thoracic/left flank tender, possible mild muscular spasm Neurological: Pt is alert. Not confused , motor intact 5/5, sens/dtr intact, gait no change Skin: Skin is warm. No rash Psychiatric: Pt behavior  is normal. No agitation.     Assessment & Plan:

## 2013-09-17 NOTE — Patient Instructions (Addendum)
Please continue all other medications as before, and refills have been done if requested.  Please have the pharmacy call with any other refills you may need.  Please continue your efforts at being more active, low cholesterol diet, and weight control.  You are otherwise up to date with prevention measures today.  Please keep your appointments with your specialists as you may have planned  If the urinalysis and culture are negative, you may wish to have a CT done to look for kidney stone as you did have small RBC's with the June 4 urine testing  Call if you have cont'd or worse LBP pain and fever as we would consider an MRI  You will be contacted regarding the referral for: urology

## 2013-09-17 NOTE — Assessment & Plan Note (Signed)
Mild, for urology referral per pt reqeust

## 2013-09-17 NOTE — Progress Notes (Signed)
Pre visit review using our clinic review tool, if applicable. No additional management support is needed unless otherwise documented below in the visit note. 

## 2013-09-17 NOTE — Assessment & Plan Note (Addendum)
Now on bactrim and to cont as is for now, but not clear to me she actually had UTI/pyelo with normal WBC, neg UA for elev WBC (was on cipro daily at the time), no urine culture done, for repeat urine studies today; consider CT to r/o renal stone or pyelo

## 2013-09-17 NOTE — Assessment & Plan Note (Signed)
?   msk , consider MRI r/o discitis if urine studies neg

## 2013-09-18 LAB — URINALYSIS, ROUTINE W REFLEX MICROSCOPIC
Bilirubin Urine: NEGATIVE
Hgb urine dipstick: NEGATIVE
Ketones, ur: NEGATIVE
Leukocytes, UA: NEGATIVE
Nitrite: NEGATIVE
PH: 6 (ref 5.0–8.0)
Specific Gravity, Urine: <=1.005 — AB (ref 1.000–1.030)
TOTAL PROTEIN, URINE-UPE24: NEGATIVE
URINE GLUCOSE: NEGATIVE
Urobilinogen, UA: 0.2 (ref 0.0–1.0)

## 2013-09-18 LAB — URINE CULTURE
Colony Count: NO GROWTH
ORGANISM ID, BACTERIA: NO GROWTH

## 2013-09-28 ENCOUNTER — Encounter: Payer: Self-pay | Admitting: Internal Medicine

## 2013-12-11 ENCOUNTER — Ambulatory Visit (INDEPENDENT_AMBULATORY_CARE_PROVIDER_SITE_OTHER): Payer: Commercial Managed Care - HMO | Admitting: Internal Medicine

## 2013-12-11 ENCOUNTER — Encounter: Payer: Self-pay | Admitting: Internal Medicine

## 2013-12-11 VITALS — BP 138/80 | HR 95 | Temp 98.4°F | Resp 22 | Ht 68.0 in | Wt 365.4 lb

## 2013-12-11 DIAGNOSIS — J018 Other acute sinusitis: Secondary | ICD-10-CM

## 2013-12-11 DIAGNOSIS — J0141 Acute recurrent pansinusitis: Secondary | ICD-10-CM

## 2013-12-11 DIAGNOSIS — N3000 Acute cystitis without hematuria: Secondary | ICD-10-CM

## 2013-12-11 MED ORDER — SULFAMETHOXAZOLE-TRIMETHOPRIM 800-160 MG PO TABS: 1.0000 | ORAL_TABLET | Freq: Two times a day (BID) | ORAL | Status: DC

## 2013-12-11 NOTE — Patient Instructions (Addendum)
We have sent in an antibiotic for your sinuses that will cover your UTI as well. Take 1 pill in the morning and 1 pill at night for 7 days. If you are not feeling better please come back.    It may not be a great idea to stay on the ciprofloxacin if you don't need it and talking to your dermatologist the next time you are there would be good as unnecessary antibiotics can make your any bacteria in your body resistant to medications.   Antibiotic Resistance Antibiotics are drugs. They fight infections caused by bacteria. Antibiotics greatly reduce illness and death from infectious diseases. Over time, the bacteria that antibiotics once controlled are much harder to kill. CAUSES  Antibiotic resistance occurs when bacteria change in some way. These changes can lessen the abilities of drugs designed to cure infections. The overuse of antibiotics can cause antibiotic resistance. Almost all important bacterial infections in the world are becoming resistant to drugs. Antibiotic resistance has been called one of the world's most pressing public health problems.  Antibiotics should be used to treat bacterial infections. But they are not effective against viral infections. These include the common cold, most sore throats, and the flu. Smart use of antibiotics will control the spread of resistance.  TREATMENT   Only use antibiotics as prescribed by your caregiver.  Talk with your caregiver about antibiotic resistance.  Ask what else you can do to feel better.  Do not take an antibiotic for a viral infection. This could be a cold, cough, or the flu.  Do not save some of your antibiotic for the next time you get sick.  Take an antibiotic exactly as the caregiver tells you.  Do not take an antibiotic that is prescribed for someone else.  Use the antibiotic as directed. Take the correct dose at the scheduled time. SEEK MEDICAL CARE IF:  You react to the antibiotic with:  A rash.  Itching.  An  upset stomach. Document Released: 06/16/2002 Document Revised: 08/10/2013 Document Reviewed: 01/19/2008 [facility] Patient Information 2015 [place], [place]. This information is not intended to replace advice given to you by your health care provider. Make sure you discuss any questions you have with your health care provider.

## 2013-12-11 NOTE — Progress Notes (Signed)
Pre visit review using our clinic review tool, if applicable. No additional management support is needed unless otherwise documented below in the visit note. 

## 2013-12-11 NOTE — Progress Notes (Signed)
   Subjective:    Patient ID: [name] Wright, female    DOB: Mar 16, 1966, 48 y.o.   MRN: [id number]  HPI The patient is a 48 YO female who is coming in for an acute visit for sinus infection. She was diagnosed with a UTI at [facility] 2 days ago but failed to pick up the medicine yet and since she is having sinus troubles she figured she would wait and get something to cover both. She does have multiple medical allergies which were reviewed with her. She has been having sinus congestion and drainage of green to yellow drainage for 4 days now. She is having sinus tenderness under her eyes. No headaches. Mild fever at home yesterday. No SOB or wheezing. No sore throat. No chest pains or chest tightness. With regard to UTI she is having frequency with mid burning and no flank or abdominal pain.    Review of Systems  Constitutional: Positive for fever. Negative for chills, activity change and appetite change.  HENT: Positive for congestion, ear pain, postnasal drip, rhinorrhea, sinus pressure and sneezing.   Eyes: Positive for discharge.  Respiratory: Negative for cough, chest tightness, shortness of breath and wheezing.   Cardiovascular: Negative for chest pain, palpitations and leg swelling.  Gastrointestinal: Negative for abdominal pain, diarrhea and constipation.  Genitourinary: Positive for dysuria, urgency and frequency. Negative for flank pain.       Objective:   Physical Exam  Constitutional: She appears well-developed and well-nourished.  HENT:  Both ear canals with erythema but without changes of the TM, no bulging or pus.   Nose with drainage, oropharynx with some redness and mild drainage, no tonsils present.   Eyes: EOM are normal.  Mild watery discharge bilaterally.   Neck: No JVD present.  Cardiovascular: Normal rate and regular rhythm.   No murmur heard. Pulmonary/Chest: Effort normal and breath sounds normal. No respiratory distress. She has no wheezes. She has  no rales. She exhibits no tenderness.  Abdominal: Soft. Bowel sounds are normal. She exhibits no distension. There is no tenderness. There is no rebound and no guarding.  Lymphadenopathy:    She has no cervical adenopathy.  Skin: Skin is warm and dry.      Assessment & Plan:

## 2013-12-11 NOTE — Assessment & Plan Note (Signed)
Urology sent in [place] for her which would not treat sinus. Will send in bactrim instead. Also advised her that the ciprofloxacin she is taking for skin problem should be discontinued if she still does not need it given risk of bacterial resistance and her many allergies.

## 2013-12-11 NOTE — Assessment & Plan Note (Signed)
Mild but given concomitant UTI will change antibiotic to bactrim to treat both given long weekend upcoming.

## 2014-04-08 ENCOUNTER — Ambulatory Visit (INDEPENDENT_AMBULATORY_CARE_PROVIDER_SITE_OTHER): Payer: Commercial Managed Care - HMO | Admitting: Family

## 2014-04-08 ENCOUNTER — Encounter: Payer: Self-pay | Admitting: Family

## 2014-04-08 VITALS — BP 140/88 | HR 86 | Temp 98.0°F | Resp 18 | Ht 68.0 in | Wt 375.2 lb

## 2014-04-08 DIAGNOSIS — J0140 Acute pansinusitis, unspecified: Secondary | ICD-10-CM

## 2014-04-08 MED ORDER — PROMETHAZINE HCL 25 MG PO TABS: 25.0000 mg | ORAL_TABLET | Freq: Four times a day (QID) | ORAL | Status: DC | PRN

## 2014-04-08 MED ORDER — LEVOFLOXACIN 500 MG PO TABS: 500.0000 mg | ORAL_TABLET | Freq: Every day | ORAL | Status: DC

## 2014-04-08 MED ORDER — LIDOCAINE 5 % EX PTCH: 1.0000 | MEDICATED_PATCH | CUTANEOUS | Status: DC | PRN

## 2014-04-08 NOTE — Progress Notes (Signed)
   Subjective:    Patient ID: [name] Wright, female    DOB: [birth year], 48 y.o.   MRN: [id number]  Chief Complaint  Patient presents with  . Cough    Drainage, headache, head congestion, ear fullness, fever, cough, x1 week, also wants refill of promethazine and lidocaine patch    HPI:  [name] Wright is a 48 y.o. female who presents today for an acute visit.    Acute symptoms of drainage, headache, head congestion, ear fullness, fever, cough has been going on for about 1 week. Has tried sudafed and mucinex-dm which has provided minimal relief. Indicates the symptoms have progressively worsened over the course of the week. Denies any recent antibiotic use.   Allergies  Allergen Reactions  . Amoxicillin Rash  . Codeine     REACTION: rash  . Compazine Other (See Comments)    Hyper/jitters/blotches  . Eszopiclone Other (See Comments)    Metallic taste  . Levetiracetam     Jitters/hyper  . Moxifloxacin     REACTION: swelling and rash  . Oxcarbazepine     REACTION: immune system suppressed  . Oxcarbazepine   . Oxycodone-Acetaminophen     REACTION: rash and vomiting  . Prochlorperazine Edisylate     REACTION: hyper and jittery  . Propoxyphene Hives  . Propoxyphene N-Acetaminophen     REACTION: rash and vomiting  . Vicodin [Hydrocodone-Acetaminophen] Rash    Past Medical History  Diagnosis Date  . Chronic pain due to trauma   . Childhood asthma   . Acute cystitis   . Head trauma     closed  . Chronic insomnia   . Dyspepsia   . Allergic rhinitis     Review of Systems  Constitutional: Positive for fever.  HENT: Positive for congestion, hearing loss, sinus pressure and sore throat.   Respiratory: Negative for shortness of breath.   Neurological: Positive for headaches.      Objective:    BP 140/88 mmHg  Pulse 86  Temp(Src) 98 F (36.7 C) (Oral)  Resp 18  Ht 5\' 8"  (1.727 m)  Wt 375 lb 3.2 oz (170.19 kg)  BMI 57.06 kg/m2  SpO2 97% Nursing note  and vital signs reviewed.  Physical Exam  Constitutional: She is oriented to person, place, and time. She appears well-developed and well-nourished. No distress.  HENT:  Right Ear: Hearing, tympanic membrane, external ear and ear canal normal.  Left Ear: Hearing, tympanic membrane, external ear and ear canal normal.  Nose: Right sinus exhibits maxillary sinus tenderness. Left sinus exhibits maxillary sinus tenderness.  Mouth/Throat: Uvula is midline, oropharynx is clear and moist and mucous membranes are normal.  Cardiovascular: Normal rate, regular rhythm, normal heart sounds and intact distal pulses.   Pulmonary/Chest: Effort normal and breath sounds normal.  Neurological: She is alert and oriented to person, place, and time.  Skin: Skin is warm and dry.  Psychiatric: She has a normal mood and affect. Her behavior is normal. Judgment and thought content normal.       Assessment & Plan:

## 2014-04-08 NOTE — Assessment & Plan Note (Signed)
Symptoms and exam consistent with acute sinusitis. Start Levaquin 7 days. Continue over-the-counter medications as needed for symptom relief. Follow-up if symptoms worsen or fail to improve.

## 2014-04-08 NOTE — Progress Notes (Signed)
Pre visit review using our clinic review tool, if applicable. No additional management support is needed unless otherwise documented below in the visit note. 

## 2014-04-08 NOTE — Patient Instructions (Signed)
Thank you for choosing [facility].  Summary/Instructions:  Your prescription(s) have been submitted to your pharmacy. Please take as directed and contact our office if you believe you are having problem(s) with the medication(s).  If your symptoms worsen or fail to improve, please contact our office for further instruction, or in case of emergency go directly to the emergency room at the closest medical facility.    Sinusitis Sinusitis is redness, soreness, and inflammation of the paranasal sinuses. Paranasal sinuses are air pockets within the bones of your face (beneath the eyes, the middle of the forehead, or above the eyes). In healthy paranasal sinuses, mucus is able to drain out, and air is able to circulate through them by way of your nose. However, when your paranasal sinuses are inflamed, mucus and air can become trapped. This can allow bacteria and other germs to grow and cause infection. Sinusitis can develop quickly and last only a short time (acute) or continue over a long period (chronic). Sinusitis that lasts for more than 12 weeks is considered chronic.  CAUSES  Causes of sinusitis include:  Allergies.  Structural abnormalities, such as displacement of the cartilage that separates your nostrils (deviated septum), which can decrease the air flow through your nose and sinuses and affect sinus drainage.  Functional abnormalities, such as when the small hairs (cilia) that line your sinuses and help remove mucus do not work properly or are not present. SIGNS AND SYMPTOMS  Symptoms of acute and chronic sinusitis are the same. The primary symptoms are pain and pressure around the affected sinuses. Other symptoms include:  Upper toothache.  Earache.  Headache.  Bad breath.  Decreased sense of smell and taste.  A cough, which worsens when you are lying flat.  Fatigue.  Fever.  Thick drainage from your nose, which often is green and may contain pus  (purulent).  Swelling and warmth over the affected sinuses. DIAGNOSIS  Your health care provider will perform a physical exam. During the exam, your health care provider may:  Look in your nose for signs of abnormal growths in your nostrils (nasal polyps).  Tap over the affected sinus to check for signs of infection.  View the inside of your sinuses (endoscopy) using an imaging device that has a light attached (endoscope). If your health care provider suspects that you have chronic sinusitis, one or more of the following tests may be recommended:  Allergy tests.  Nasal culture. A sample of mucus is taken from your nose, sent to a lab, and screened for bacteria.  Nasal cytology. A sample of mucus is taken from your nose and examined by your health care provider to determine if your sinusitis is related to an allergy. TREATMENT  Most cases of acute sinusitis are related to a viral infection and will resolve on their own within 10 days. Sometimes medicines are prescribed to help relieve symptoms (pain medicine, decongestants, nasal steroid sprays, or saline sprays).  However, for sinusitis related to a bacterial infection, your health care provider will prescribe antibiotic medicines. These are medicines that will help kill the bacteria causing the infection.  Rarely, sinusitis is caused by a fungal infection. In theses cases, your health care provider will prescribe antifungal medicine. For some cases of chronic sinusitis, surgery is needed. Generally, these are cases in which sinusitis recurs more than 3 times per year, despite other treatments. HOME CARE INSTRUCTIONS   Drink plenty of water. Water helps thin the mucus so your sinuses can drain more easily.  Use a humidifier.  Inhale steam 3 to 4 times a day (for example, sit in the bathroom with the shower running).  Apply a warm, moist washcloth to your face 3 to 4 times a day, or as directed by your health care provider.  Use  saline nasal sprays to help moisten and clean your sinuses.  Take medicines only as directed by your health care provider.  If you were prescribed either an antibiotic or antifungal medicine, finish it all even if you start to feel better. SEEK IMMEDIATE MEDICAL CARE IF:  You have increasing pain or severe headaches.  You have nausea, vomiting, or drowsiness.  You have swelling around your face.  You have vision problems.  You have a stiff neck.  You have difficulty breathing. MAKE SURE YOU:   Understand these instructions.  Will watch your condition.  Will get help right away if you are not doing well or get worse. Document Released: 03/26/2005 Document Revised: 08/10/2013 Document Reviewed: 04/10/2011 [facility] Patient Information 2015 [place], [place]. This information is not intended to replace advice given to you by your health care provider. Make sure you discuss any questions you have with your health care provider.

## 2014-04-13 ENCOUNTER — Other Ambulatory Visit: Payer: Self-pay

## 2014-04-13 MED ORDER — LIDOCAINE 5 % EX PTCH: 1.0000 | MEDICATED_PATCH | CUTANEOUS | Status: DC | PRN

## 2014-04-14 ENCOUNTER — Telehealth: Payer: Self-pay | Admitting: Internal Medicine

## 2014-04-14 MED ORDER — DOXYCYCLINE HYCLATE 100 MG PO TABS: 100.0000 mg | ORAL_TABLET | Freq: Two times a day (BID) | ORAL | Status: DC

## 2014-04-14 NOTE — Telephone Encounter (Signed)
Pt seen for sinusitis 12/31, finished 7 days of antibiotic now brown phlegmy stuff, from nose, drainage down back of throat. Pt states she was to check back with [name] Wright if not improved. Pt still taking mucinex [phone], pls advise

## 2014-04-14 NOTE — Telephone Encounter (Signed)
Called pt and made her aware.  

## 2014-04-14 NOTE — Telephone Encounter (Signed)
Please notify patient doxycyline was sent to her pharmacy.

## 2014-09-02 ENCOUNTER — Encounter: Payer: Self-pay | Admitting: Internal Medicine

## 2014-09-02 ENCOUNTER — Ambulatory Visit (INDEPENDENT_AMBULATORY_CARE_PROVIDER_SITE_OTHER): Payer: Medicare HMO | Admitting: Internal Medicine

## 2014-09-02 VITALS — BP 132/88 | HR 96 | Temp 98.3°F | Resp 16 | Wt 371.0 lb

## 2014-09-02 DIAGNOSIS — R3915 Urgency of urination: Secondary | ICD-10-CM | POA: Diagnosis not present

## 2014-09-02 DIAGNOSIS — J0101 Acute recurrent maxillary sinusitis: Secondary | ICD-10-CM | POA: Diagnosis not present

## 2014-09-02 MED ORDER — SULFAMETHOXAZOLE-TRIMETHOPRIM 800-160 MG PO TABS
1.0000 | ORAL_TABLET | Freq: Two times a day (BID) | ORAL | Status: DC
Start: 2014-09-02 — End: 2014-10-19

## 2014-09-02 NOTE — Progress Notes (Signed)
Pre visit review using our clinic review tool, if applicable. No additional management support is needed unless otherwise documented below in the visit note. 

## 2014-09-02 NOTE — Patient Instructions (Addendum)
Plain Mucinex (NOT D) for thick secretions ;force NON dairy fluids .   Nasal cleansing in the shower as discussed with lather of mild shampoo.After 10 seconds wash off lather while  exhaling through nostrils. Make sure that all residual soap is removed to prevent irritation.  Flonase OR Nasacort AQ 1 spray in each nostril twice a day as needed. Use the "crossover" technique into opposite nostril spraying toward opposite ear @ 45 degree angle, not straight up into nostril.  Plain Allegra (NOT D )  160 daily , Loratidine 10 mg , OR Zyrtec 10 mg @ bedtime  as needed for itchy eyes & sneezing.  SittingDrink as much nondairy fluids as possible. Avoid spicy foods or alcohol as  these may aggravate the bladder. Do not take decongestants. Avoid narcotics if possible.

## 2014-09-02 NOTE — Progress Notes (Signed)
   Subjective:    Patient ID: [name] Wright, female    DOB: [birth year], 49 y.o.   MRN: [id number]  HPI She has had sinus symptoms for 6 days with nasal congestion; nasal obstruction; maxillary sinus pain; and green nasal discharge. She's had minor symptoms of ear discomfort and discharge. Discharge has not been visualized. She also describes fatigue. She's had low-grade fever, chills, and sweats. She does have a past history of a maxillary sinus cyst. This was treated with steroid therapy. She was on Levaquin in December 2015 for sinusitis. She has an intolerance to amoxicillin and moxifloxacin. She states that Biaxin has caused nausea.  For 6 days she's also had urinary urgency.  Review of Systems  She does not have frontal sinus pain, dental pain, or lower respiratory tract symptoms.  She also denies dysuria, hematuria, or pyuria.   She has been treated with Cipro for up to 30 days a time by her Dermatologist for skin infection.     Objective:   Physical Exam  Pertinent or positive findings include: She has an erythematous rash above the eyes and over the cheeks and. perinasal areas.  There is marked erythema of the nasal septum, particularly on the right  General appearance :adequately nourished; in no distress. BMI:56.42 Eyes: No conjunctival inflammation or scleral icterus is present. Oral exam:  Lips and gums are healthy appearing.There is no oropharyngeal erythema or exudate noted. Dental hygiene is good. Heart:  Normal rate and regular rhythm. S1 and S2 normal without gallop, murmur, click, rub or other extra sounds   Lungs:Chest clear to auscultation; no wheezes, rhonchi,rales ,or rubs present.No increased work of breathing.  Abdomen: bowel sounds normal, soft and non-tender without masses, organomegaly or hernias noted.  No guarding or rebound. No flank tenderness to percussion. Vascular : all pulses equal ; no bruits present. Skin:Warm & dry; no tenting or jaundice    Lymphatic: No lymphadenopathy is noted about the head, neck, axilla Neuro: Strength, tone & DTRs normal.       Assessment & Plan:  #1 Maxillary sinusitis,recurrent  #2 urgency  Plan: See orders recommendations

## 2014-09-15 ENCOUNTER — Ambulatory Visit: Payer: Medicare HMO | Admitting: Internal Medicine

## 2014-09-15 DIAGNOSIS — Z0289 Encounter for other administrative examinations: Secondary | ICD-10-CM

## 2014-10-19 ENCOUNTER — Emergency Department (HOSPITAL_COMMUNITY)
Admission: EM | Admit: 2014-10-19 | Discharge: 2014-10-19 | Disposition: A | Discharge status: Home / Self Care | Payer: Medicare HMO | Attending: Emergency Medicine | Admitting: Emergency Medicine

## 2014-10-19 ENCOUNTER — Emergency Department (HOSPITAL_COMMUNITY): Payer: Medicare HMO

## 2014-10-19 ENCOUNTER — Encounter (HOSPITAL_COMMUNITY): Payer: Self-pay | Admitting: Emergency Medicine

## 2014-10-19 DIAGNOSIS — Z8669 Personal history of other diseases of the nervous system and sense organs: Secondary | ICD-10-CM | POA: Insufficient documentation

## 2014-10-19 DIAGNOSIS — Z8719 Personal history of other diseases of the digestive system: Secondary | ICD-10-CM | POA: Diagnosis not present

## 2014-10-19 DIAGNOSIS — J45909 Unspecified asthma, uncomplicated: Secondary | ICD-10-CM | POA: Insufficient documentation

## 2014-10-19 DIAGNOSIS — R1011 Right upper quadrant pain: Secondary | ICD-10-CM | POA: Insufficient documentation

## 2014-10-19 DIAGNOSIS — Z880 Allergy status to penicillin: Secondary | ICD-10-CM | POA: Diagnosis not present

## 2014-10-19 DIAGNOSIS — Z87828 Personal history of other (healed) physical injury and trauma: Secondary | ICD-10-CM | POA: Insufficient documentation

## 2014-10-19 DIAGNOSIS — Z87448 Personal history of other diseases of urinary system: Secondary | ICD-10-CM | POA: Insufficient documentation

## 2014-10-19 DIAGNOSIS — Z79899 Other long term (current) drug therapy: Secondary | ICD-10-CM | POA: Diagnosis not present

## 2014-10-19 DIAGNOSIS — R109 Unspecified abdominal pain: Secondary | ICD-10-CM

## 2014-10-19 DIAGNOSIS — Z9049 Acquired absence of other specified parts of digestive tract: Secondary | ICD-10-CM | POA: Insufficient documentation

## 2014-10-19 DIAGNOSIS — G8921 Chronic pain due to trauma: Secondary | ICD-10-CM | POA: Diagnosis not present

## 2014-10-19 LAB — CBC WITH DIFFERENTIAL/PLATELET
BASOS ABS: 0.1 10*3/uL (ref 0.0–0.1)
BASOS PCT: 1 % (ref 0–1)
EOS ABS: 0.2 10*3/uL (ref 0.0–0.7)
Eosinophils Relative: 3 % (ref 0–5)
HCT: 40.1 % (ref 36.0–46.0)
Hemoglobin: 13.1 g/dL (ref 12.0–15.0)
LYMPHS PCT: 30 % (ref 12–46)
Lymphs Abs: 2.3 10*3/uL (ref 0.7–4.0)
MCH: 28.4 pg (ref 26.0–34.0)
MCHC: 32.7 g/dL (ref 30.0–36.0)
MCV: 87 fL (ref 78.0–100.0)
MONO ABS: 0.5 10*3/uL (ref 0.1–1.0)
Monocytes Relative: 7 % (ref 3–12)
NEUTROS PCT: 59 % (ref 43–77)
Neutro Abs: 4.7 10*3/uL (ref 1.7–7.7)
Platelets: 349 10*3/uL (ref 150–400)
RBC: 4.61 MIL/uL (ref 3.87–5.11)
RDW: 13.2 % (ref 11.5–15.5)
WBC: 7.8 10*3/uL (ref 4.0–10.5)

## 2014-10-19 LAB — URINALYSIS, ROUTINE W REFLEX MICROSCOPIC
BILIRUBIN URINE: NEGATIVE
GLUCOSE, UA: NEGATIVE mg/dL
Hgb urine dipstick: NEGATIVE
Ketones, ur: NEGATIVE mg/dL
LEUKOCYTES UA: NEGATIVE
NITRITE: NEGATIVE
PH: 5 (ref 5.0–8.0)
PROTEIN: NEGATIVE mg/dL
SPECIFIC GRAVITY, URINE: 1.033 — AB (ref 1.005–1.030)
Urobilinogen, UA: 0.2 mg/dL (ref 0.0–1.0)

## 2014-10-19 LAB — BASIC METABOLIC PANEL
ANION GAP: 7 (ref 5–15)
BUN: 11 mg/dL (ref 6–20)
CO2: 28 mmol/L (ref 22–32)
CREATININE: 0.68 mg/dL (ref 0.44–1.00)
Calcium: 8.7 mg/dL — ABNORMAL LOW (ref 8.9–10.3)
Chloride: 102 mmol/L (ref 101–111)
GFR calc Af Amer: >60 mL/min (ref >60)
GFR calc non Af Amer: >60 mL/min (ref >60)
Glucose, Bld: 104 mg/dL — ABNORMAL HIGH (ref 65–99)
POTASSIUM: 4.2 mmol/L (ref 3.5–5.1)
SODIUM: 137 mmol/L (ref 135–145)

## 2014-10-19 LAB — HEPATIC FUNCTION PANEL
ALT: 22 U/L (ref 14–54)
AST: 21 U/L (ref 15–41)
Albumin: 3.6 g/dL (ref 3.5–5.0)
Alkaline Phosphatase: 91 U/L (ref 38–126)
Bilirubin, Direct: <0.1 mg/dL — ABNORMAL LOW (ref 0.1–0.5)
TOTAL PROTEIN: 7.4 g/dL (ref 6.5–8.1)
Total Bilirubin: 0.5 mg/dL (ref 0.3–1.2)

## 2014-10-19 LAB — LIPASE, BLOOD: Lipase: 13 U/L — ABNORMAL LOW (ref 22–51)

## 2014-10-19 MED ORDER — METHYLPREDNISOLONE SODIUM SUCC 125 MG IJ SOLR: 125.0000 mg | Freq: Once | INTRAMUSCULAR | Status: DC

## 2014-10-19 MED ORDER — FENTANYL CITRATE (PF) 100 MCG/2ML IJ SOLN
100.0000 ug | Freq: Once | INTRAMUSCULAR | Status: AC
  Administered 2014-10-19: 100 ug via INTRAVENOUS
  Filled 2014-10-19: qty 2

## 2014-10-19 MED ORDER — IBUPROFEN 400 MG PO TABS
ORAL_TABLET | ORAL | Status: AC
  Filled 2014-10-19: qty 1

## 2014-10-19 MED ORDER — PROMETHAZINE HCL 25 MG PO TABS
25.0000 mg | ORAL_TABLET | Freq: Four times a day (QID) | ORAL | Status: DC | PRN
Start: 2014-10-19 — End: 2020-07-14

## 2014-10-19 MED ORDER — SODIUM CHLORIDE 0.9 % IV SOLN
Freq: Once | INTRAVENOUS | Status: AC
  Administered 2014-10-19: 18:00:00 via INTRAVENOUS

## 2014-10-19 MED ORDER — PROMETHAZINE HCL 25 MG/ML IJ SOLN
25.0000 mg | Freq: Once | INTRAMUSCULAR | Status: AC
  Administered 2014-10-19: 25 mg via INTRAVENOUS
  Filled 2014-10-19: qty 1

## 2014-10-19 MED ORDER — IBUPROFEN 400 MG PO TABS
400.0000 mg | ORAL_TABLET | Freq: Once | ORAL | Status: AC
  Administered 2014-10-19: 400 mg via ORAL

## 2014-10-19 MED ORDER — DIPHENHYDRAMINE HCL 50 MG/ML IJ SOLN: 25.0000 mg | Freq: Once | INTRAMUSCULAR | Status: DC

## 2014-10-19 MED ORDER — IBUPROFEN 800 MG PO TABS: 800.0000 mg | ORAL_TABLET | Freq: Three times a day (TID) | ORAL | Status: DC

## 2014-10-19 MED ORDER — ONDANSETRON 4 MG PO TBDP
ORAL_TABLET | ORAL | Status: AC
  Filled 2014-10-19: qty 1

## 2014-10-19 MED ORDER — ONDANSETRON 4 MG PO TBDP
4.0000 mg | ORAL_TABLET | Freq: Once | ORAL | Status: AC | PRN
  Administered 2014-10-19: 4 mg via ORAL

## 2014-10-19 MED ORDER — TRAMADOL HCL 50 MG PO TABS: 50.0000 mg | ORAL_TABLET | Freq: Four times a day (QID) | ORAL | Status: DC | PRN

## 2014-10-19 NOTE — ED Notes (Signed)
PT DECLINED [place].

## 2014-10-19 NOTE — ED Notes (Signed)
PT CAME BACK TO NURSE FIRST REQUESTING ZOFRAN.

## 2014-10-19 NOTE — Discharge Instructions (Signed)
Please call your doctor for a followup appointment within 24-48 hours. When you talk to your doctor please let them know that you were seen in the emergency department and have them acquire all of your records so that they can discuss the findings with you and formulate a treatment plan to fully care for your new and ongoing problems. ° °

## 2014-10-19 NOTE — ED Provider Notes (Signed)
CSN: [id number]     Arrival date & time 10/19/14  1158 History   First MD Initiated Contact with Patient 10/19/14 1657     Chief Complaint  Patient presents with  . Abdominal Pain    pain on the right side below armpit down     (Consider location/radiation/quality/duration/timing/severity/associated sxs/prior Treatment) HPI  The patient is a very pleasant 49 year old female who is obese, has a history of appendicitis in the past and presents today with right upper quadrant and right-sided abdominal discomfort. This started several days ago and was intermittent at first, yesterday became more dull and today has been constant and associated with nausea and vomiting. The pain is worse with eating, it is not associated with dysuria diarrhea constipation or blood in the stool or the urine. She received a dose of Zofran on arrival and vomited that. She has never had gallbladder problems but feels that this may be the problem today. There are no fevers chills coughing shortness of breath chest pain back pain headache numbness weakness rashes or swelling.  Past Medical History  Diagnosis Date  . Chronic pain due to trauma   . Childhood asthma   . Acute cystitis   . Head trauma     closed  . Chronic insomnia   . Dyspepsia   . Allergic rhinitis    Past Surgical History  Procedure Laterality Date  . Repair of torn ligament right leg    . Repair of toe laceration    . Patella fracture surgery      right  . Right foot fracture    . Appendectomy    . Tonsillectomy     Family History  Problem Relation Age of Onset  . Cancer Maternal Grandmother     ovarion   History  Substance Use Topics  . Smoking status: Never Smoker   . Smokeless tobacco: Not on file  . Alcohol Use: No   OB History    No data available     Review of Systems  All other systems reviewed and are negative.     Allergies  Amoxicillin; Codeine; Compazine; Eszopiclone; Levetiracetam; Moxifloxacin;  Oxcarbazepine; Oxcarbazepine; Oxycodone-acetaminophen; Prochlorperazine edisylate; Propoxyphene; Propoxyphene n-acetaminophen; and Vicodin  Home Medications   Prior to Admission medications   Medication Sig Start Date End Date Taking? Authorizing Provider  cetirizine (ZYRTEC) 10 MG tablet Take 10 mg by mouth daily.   Yes Historical Provider, MD  diazepam (VALIUM) 10 MG tablet Take 10 mg by mouth every 6 (six) hours as needed for anxiety.    Yes Historical Provider, MD  esomeprazole (NEXIUM) 40 MG capsule Take 40 mg by mouth daily as needed (heartburn / indigestion).    Yes Historical Provider, MD  furosemide (LASIX) 80 MG tablet Take 80 mg by mouth daily as needed for fluid.   Yes Historical Provider, MD  montelukast (SINGULAIR) 10 MG tablet Take 10 mg by mouth at bedtime.   Yes Historical Provider, MD  ibuprofen (ADVIL,MOTRIN) 800 MG tablet Take 1 tablet (800 mg total) by mouth 3 (three) times daily. 10/19/14   Noemi Chapel, MD  promethazine (PHENERGAN) 25 MG tablet Take 1 tablet (25 mg total) by mouth every 6 (six) hours as needed for nausea or vomiting. 10/19/14   Noemi Chapel, MD  traMADol (ULTRAM) 50 MG tablet Take 1 tablet (50 mg total) by mouth every 6 (six) hours as needed. 10/19/14   Noemi Chapel, MD   BP 161/85 mmHg  Pulse 71  Temp(Src) 98.5 F (36.9  C) (Oral)  Resp 19  SpO2 99%  LMP 10/14/2014 Physical Exam  Constitutional: She appears well-developed and well-nourished. No distress.  HENT:  Head: Normocephalic and atraumatic.  Mouth/Throat: Oropharynx is clear and moist. No oropharyngeal exudate.  Eyes: Conjunctivae and EOM are normal. Pupils are equal, round, and reactive to light. Right eye exhibits no discharge. Left eye exhibits no discharge. No scleral icterus.  Neck: Normal range of motion. Neck supple. No JVD present. No thyromegaly present.  Cardiovascular: Normal rate, regular rhythm, normal heart sounds and intact distal pulses.  Exam reveals no gallop and no friction  rub.   No murmur heard. Pulmonary/Chest: Effort normal and breath sounds normal. No respiratory distress. She has no wheezes. She has no rales.  Abdominal: Soft. Bowel sounds are normal. She exhibits no distension and no mass. There is tenderness ( Focal tenderness to palpation in the right upper quadrant and the right side underneath the ribs. The patient is morbidly obese, there is no guarding, no tenderness in the right lower or left lower quadrant, no peritoneal signs).  Musculoskeletal: Normal range of motion. She exhibits no edema or tenderness.  Lymphadenopathy:    She has no cervical adenopathy.  Neurological: She is alert. Coordination normal.  Skin: Skin is warm and dry. No rash noted. No erythema.  Psychiatric: She has a normal mood and affect. Her behavior is normal.  Nursing note and vitals reviewed.   ED Course  Procedures (including critical care time) Labs Review Labs Reviewed  LIPASE, BLOOD - Abnormal; Notable for the following:    Lipase 13 (*)    All other components within normal limits  BASIC METABOLIC PANEL - Abnormal; Notable for the following:    Glucose, Bld 104 (*)    Calcium 8.7 (*)    All other components within normal limits  URINALYSIS, ROUTINE W REFLEX MICROSCOPIC (NOT AT [facility]) - Abnormal; Notable for the following:    Color, Urine AMBER (*)    APPearance CLOUDY (*)    Specific Gravity, Urine 1.033 (*)    All other components within normal limits  HEPATIC FUNCTION PANEL - Abnormal; Notable for the following:    Bilirubin, Direct <0.1 (*)    All other components within normal limits  CBC WITH DIFFERENTIAL/PLATELET    Imaging Review US Abdomen Complete  10/19/2014   CLINICAL DATA:  Acute abdominal pain for 4 days. Prior appendectomy. Obesity.  EXAM: ULTRASOUND ABDOMEN COMPLETE  COMPARISON:  06/13/2007  FINDINGS: Gallbladder: No gallstones or wall thickening visualized. No sonographic Murphy sign noted.  Common bile duct: Diameter: 3.5 mm  Liver:  Diffuse increased echogenicity compatible with hepatic steatosis. No gross focal abnormality or biliary dilatation.  IVC: No abnormality visualized.  Pancreas: Very limited visualization.  No gross abnormality.  Spleen: Size and appearance within normal limits.  Right Kidney: Length: 11.2 cm. Echogenicity within normal limits. No mass or hydronephrosis visualized.  Left Kidney: Length: 11.1 cm. Echogenicity within normal limits. No mass or hydronephrosis visualized.  Abdominal aorta: Not visualized  Other findings: No gross abdominal free fluid or ascites  IMPRESSION: Limited exam because of body habitus.  Negative for gallstones or cholecystitis  No biliary dilatation  Hepatic steatosis   Electronically Signed   By: Jerilynn Mages.  Shick M.D.   On: 10/19/2014 20:51     EKG Interpretation None      MDM   Final diagnoses:  Abdominal pain    The vital signs are unremarkable, her exam is consistent with cholecystitis or other right  upper quadrant pathology however her labs are normal. We'll obtain ultrasound and urinalysis which was not ordered on arrival to the triage area.  Labs unremarkable, urinalysis without infection, lipase metabolic panel, liver function and [place]  D/w pt re: results - she is stable - she has no abnormal findings of concern - she has been given option of CT vs home - and has chosen home with f/u .  Meds given in ED:  Medications  ondansetron (ZOFRAN-ODT) disintegrating tablet 4 mg (4 mg Oral Given 10/19/14 1332)  ibuprofen (ADVIL,MOTRIN) tablet 400 mg (400 mg Oral Given 10/19/14 1336)  0.9 %  sodium chloride infusion ( Intravenous Stopped 10/19/14 1910)  fentaNYL (SUBLIMAZE) injection 100 mcg (100 mcg Intravenous Given 10/19/14 1748)  promethazine (PHENERGAN) injection 25 mg (25 mg Intravenous Given 10/19/14 1748)    Discharge Medication List as of 10/19/2014  9:42 PM    START taking these medications   Details  promethazine (PHENERGAN) 25 MG tablet Take 1 tablet (25 mg total) by  mouth every 6 (six) hours as needed for nausea or vomiting., Starting 10/19/2014, Until Discontinued, Print    traMADol (ULTRAM) 50 MG tablet Take 1 tablet (50 mg total) by mouth every 6 (six) hours as needed., Starting 10/19/2014, Until Discontinued, Print            Noemi Chapel, MD 10/20/14 [phone]

## 2014-10-19 NOTE — ED Notes (Signed)
Pt requesting Ibuprofen.

## 2014-10-19 NOTE — ED Notes (Signed)
Pt. Stated, I think its my gallbladder , I started having a ting of pain on Friday off and on  And continued to get worse and worse.  Today its been non-stop.  I have not eaten today.  i took ibuprofen and sudafed.

## 2014-10-19 NOTE — ED Notes (Signed)
Lab contacted for add-on hepatic function; reports 20-25 mins for results

## 2014-10-19 NOTE — ED Notes (Signed)
Patient returned to room from Ultrasound.

## 2014-10-27 ENCOUNTER — Encounter: Payer: Self-pay | Admitting: Internal Medicine

## 2014-10-27 ENCOUNTER — Ambulatory Visit (INDEPENDENT_AMBULATORY_CARE_PROVIDER_SITE_OTHER): Payer: Medicare HMO | Admitting: Internal Medicine

## 2014-10-27 VITALS — BP 132/86 | HR 85 | Temp 98.2°F | Ht 68.0 in | Wt 378.0 lb

## 2014-10-27 DIAGNOSIS — R1011 Right upper quadrant pain: Secondary | ICD-10-CM

## 2014-10-27 DIAGNOSIS — R7302 Impaired glucose tolerance (oral): Secondary | ICD-10-CM

## 2014-10-27 NOTE — Progress Notes (Signed)
Subjective:    Patient ID: [name] Wright, female    DOB: Apr 09, 1966, 49 y.o.   MRN: [id number]   HPI  Here with total 15 days RUQ pain, Was seen July 12 in ER -  The patient has a history of appendicitis in the past and presents today with right upper quadrant and right-sided abdominal discomfort.  was intermittent at first, then became more dull and last wk has been constant and associated with nausea and vomiting. The pain is worse with eating, seems more swollen to her in that area,  it is not associated with dysuria diarrhea constipation or blood in the stool or the urine. There is low grade temp,but no chills coughing shortness of breath chest pain back pain headache numbness weakness rashes or swelling.  Was seen on referral from ER to GI July 18 Dr Benson Norway, with pain per pt report possibly c/w rib/muscle pain related to nausea/vomiting, tx with prednisone course, but overall still not improved.No radiation.nonpleuritic, nonpositoinal nonexertional. Pt denies chest pain, increased sob or doe, wheezing, orthopnea, PND, increased LE swelling, palpitations, dizziness or syncope. Denies urinary symptoms such as dysuria, frequency, urgency, flank pain, hematuria. Recent UA neg at ER July 12.   Pt denies polydipsia, polyuria Past Medical History  Diagnosis Date  . Chronic pain due to trauma   . Childhood asthma   . Acute cystitis   . Head trauma     closed  . Chronic insomnia   . Dyspepsia   . Allergic rhinitis    Past Surgical History  Procedure Laterality Date  . Repair of torn ligament right leg    . Repair of toe laceration    . Patella fracture surgery      right  . Right foot fracture    . Appendectomy    . Tonsillectomy      reports that she has never smoked. She does not have any smokeless tobacco history on file. She reports that she does not drink alcohol. Her drug history is not on file. family history includes Cancer in her maternal grandmother. Allergies  Allergen  Reactions  . Amoxicillin Rash  . Codeine     REACTION: rash  . Compazine Other (See Comments)    Hyper/jitters/blotches  . Eszopiclone Other (See Comments)    Metallic taste  . Levetiracetam     Jitters/hyper  . Moxifloxacin     REACTION: swelling and rash  . Oxcarbazepine     REACTION: immune system suppressed  . Oxcarbazepine   . Oxycodone-Acetaminophen     REACTION: rash and vomiting  . Prochlorperazine Edisylate     REACTION: hyper and jittery  . Propoxyphene Hives  . Propoxyphene N-Acetaminophen     REACTION: rash and vomiting  . Vicodin [Hydrocodone-Acetaminophen] Rash   Review of Systems  Constitutional: Negative for unusual diaphoresis or night sweats HENT: Negative for ringing in ear or discharge Eyes: Negative for double vision or worsening visual disturbance.  Respiratory: Negative for choking and stridor.   Gastrointestinal: Negative for vomiting or other signifcant bowel change Genitourinary: Negative for hematuria or change in urine volume.  Musculoskeletal: Negative for other MSK pain or swelling Skin: Negative for color change and worsening wound.  Neurological: Negative for tremors and numbness other than noted  Psychiatric/Behavioral: Negative for decreased concentration or agitation other than above       Objective:   Physical Exam BP 132/86 mmHg  Pulse 85  Temp(Src) 98.2 F (36.8 C) (Oral)  Ht 5\' 8"  (1.727  m)  Wt 378 lb (171.46 kg)  BMI 57.49 kg/m2  SpO2 97%  LMP 10/14/2014 VS noted,  Constitutional: Pt appears in no significant distress HENT: Head: NCAT.  Right Ear: External ear normal.  Left Ear: External ear normal.  Eyes: . Pupils are equal, round, and reactive to light. Conjunctivae and EOM are normal Neck: Normal range of motion. Neck supple.  Cardiovascular: Normal rate and regular rhythm.   Pulmonary/Chest: Effort normal and breath sounds without rales or wheezing.  Abd:  Soft, ND, + BS, morbid obese so habitus makes exam very  difficult but overall mod tender RUQ, without guarding, rebound Neurological: Pt is alert. Not confused , motor grossly intact Skin: Skin is warm. No rash, no LE edema Psychiatric: Pt behavior is normal. No agitation.      Assessment & Plan:

## 2014-10-27 NOTE — Patient Instructions (Signed)
Please continue all other medications as before, and refills have been done if requested.  Please have the pharmacy call with any other refills you may need.  Please continue your efforts at being more active, low cholesterol diet, and weight control.  Please keep your appointments with your specialists as you may have planned  You will be contacted regarding the referral for: CT ABD/pelvis with contrast tomorrow

## 2014-10-27 NOTE — Assessment & Plan Note (Addendum)
Now with 15 days persistent pain, ? Swelling, and recent recurrent n/v, limited US neg for acute, UA and routine labs neg, not really better with prednisone so far - ok for CT abd/pelvis with CM - r/o occult infectious process given subjective fever; if neg would need to f/u with Dr Benson Norway for ? Need for EGD, to cont all other tx including PPI and antiemetic, would hold on antibx until results known

## 2014-10-27 NOTE — Progress Notes (Signed)
Pre visit review using our clinic review tool, if applicable. No additional management support is needed unless otherwise documented below in the visit note. 

## 2014-10-28 ENCOUNTER — Ambulatory Visit (INDEPENDENT_AMBULATORY_CARE_PROVIDER_SITE_OTHER)
Admission: RE | Admit: 2014-10-28 | Discharge: 2014-10-28 | Disposition: A | Discharge status: Home / Self Care | Payer: Medicare HMO | Source: Ambulatory Visit | Attending: Internal Medicine | Admitting: Internal Medicine

## 2014-10-28 DIAGNOSIS — R1011 Right upper quadrant pain: Secondary | ICD-10-CM | POA: Diagnosis not present

## 2014-10-28 MED ORDER — IOHEXOL 300 MG/ML  SOLN
100.0000 mL | Freq: Once | INTRAMUSCULAR | Status: AC | PRN
  Administered 2014-10-28: 100 mL via INTRAVENOUS

## 2014-10-28 NOTE — Assessment & Plan Note (Signed)
stable overall by history and exam, recent data reviewed with pt, and pt to continue medical treatment as before,  to f/u any worsening symptoms or concerns Lab Results  Component Value Date   HGBA1C 5.9 07/10/2012

## 2014-10-29 ENCOUNTER — Telehealth: Payer: Self-pay

## 2014-10-29 NOTE — Telephone Encounter (Signed)
Pt received CT results and states that she is still experiencing abdominal pain. Pt is requesting a referral to GI, please advise

## 2014-10-29 NOTE — Telephone Encounter (Signed)
I believe she has seen GI on Monday earlier this wk, maybe she could call for f/u appt with that MD, rather than wait on seeing a [facility] provider?

## 2014-11-03 ENCOUNTER — Telehealth: Payer: Self-pay

## 2014-11-03 ENCOUNTER — Encounter: Payer: Self-pay | Admitting: Gastroenterology

## 2014-11-03 DIAGNOSIS — R1011 Right upper quadrant pain: Secondary | ICD-10-CM

## 2014-11-03 NOTE — Telephone Encounter (Signed)
Pt referral has been entered.

## 2014-11-03 NOTE — Telephone Encounter (Signed)
Referral for GI sent

## 2014-11-08 ENCOUNTER — Telehealth: Payer: Self-pay | Admitting: *Deleted

## 2014-11-08 DIAGNOSIS — N3000 Acute cystitis without hematuria: Secondary | ICD-10-CM

## 2014-11-08 DIAGNOSIS — R1011 Right upper quadrant pain: Secondary | ICD-10-CM

## 2014-11-08 NOTE — Telephone Encounter (Signed)
Pt states she saw md couple weeks ago. MD told her to call back if she think she might have UTI and he would check urine. Pt states she is still having the RUQ pain not schedule to see GI MD until 12/09/14, but over the weekend started having some pressure & lil burning when urinate. Advise pt will enter order to have UA check. Order has been placed...[name] Wright

## 2014-11-09 ENCOUNTER — Other Ambulatory Visit (INDEPENDENT_AMBULATORY_CARE_PROVIDER_SITE_OTHER): Payer: Medicare HMO

## 2014-11-09 ENCOUNTER — Ambulatory Visit (INDEPENDENT_AMBULATORY_CARE_PROVIDER_SITE_OTHER)
Admission: RE | Admit: 2014-11-09 | Discharge: 2014-11-09 | Disposition: A | Discharge status: Home / Self Care | Payer: Medicare HMO | Source: Ambulatory Visit | Attending: Internal Medicine | Admitting: Internal Medicine

## 2014-11-09 ENCOUNTER — Encounter: Payer: Self-pay | Admitting: Internal Medicine

## 2014-11-09 ENCOUNTER — Ambulatory Visit (INDEPENDENT_AMBULATORY_CARE_PROVIDER_SITE_OTHER): Payer: Medicare HMO | Admitting: Internal Medicine

## 2014-11-09 VITALS — BP 118/74 | HR 80 | Temp 98.4°F

## 2014-11-09 DIAGNOSIS — R609 Edema, unspecified: Secondary | ICD-10-CM | POA: Diagnosis not present

## 2014-11-09 DIAGNOSIS — R1011 Right upper quadrant pain: Secondary | ICD-10-CM | POA: Diagnosis not present

## 2014-11-09 DIAGNOSIS — N3000 Acute cystitis without hematuria: Secondary | ICD-10-CM | POA: Diagnosis not present

## 2014-11-09 DIAGNOSIS — R7302 Impaired glucose tolerance (oral): Secondary | ICD-10-CM | POA: Diagnosis not present

## 2014-11-09 LAB — URINALYSIS, ROUTINE W REFLEX MICROSCOPIC
BILIRUBIN URINE: NEGATIVE
HGB URINE DIPSTICK: NEGATIVE
Ketones, ur: NEGATIVE
Leukocytes, UA: NEGATIVE
Nitrite: NEGATIVE
Specific Gravity, Urine: 1.025 (ref 1.000–1.030)
TOTAL PROTEIN, URINE-UPE24: NEGATIVE
Urine Glucose: NEGATIVE
Urobilinogen, UA: 0.2 (ref 0.0–1.0)
pH: 6 (ref 5.0–8.0)

## 2014-11-09 MED ORDER — PREGABALIN 50 MG PO CAPS: 50.0000 mg | ORAL_CAPSULE | Freq: Three times a day (TID) | ORAL | Status: DC

## 2014-11-09 NOTE — Assessment & Plan Note (Signed)
Chronic persistent, ok for trial lyrica for ? Neuritis,  to f/u any worsening symptoms or concerns., also cxr r/o pulm process since pain now from shoulder blade area

## 2014-11-09 NOTE — Progress Notes (Signed)
Subjective:    Patient ID: [name] Wright, female    DOB: 1966-01-18, 49 y.o.   MRN: [id number]  HPI  Here to f/u, c/o pain RUQ, pulling sensation, seems to be from the right shoulder blade area to the RUQ, just does not think it to be ribs.  Phenergan helps with nausea, no nausea recent, no vomiting.  Denies urinary symptoms such as dysuria, frequency, urgency, flank pain, hematuria or n/v, fever, chills.  Finished steroid tx.  Has f/u appt with [facility] sept 2.  Not taken the nexium for some time, Denies worsening reflux, other abd pain, dysphagia, bowel change or blood. Robaxin not helping for this pain. She has had nerve pain in the past with shingles and other nerve pain and does not think nerve pain.  Gained 10 lbs with steroid tx, helped knees but nothing else.  Actually taking morphine she has leftover from previous rx. Past Medical History  Diagnosis Date  . Chronic pain due to trauma   . Childhood asthma   . Acute cystitis   . Head trauma     closed  . Chronic insomnia   . Dyspepsia   . Allergic rhinitis    Past Surgical History  Procedure Laterality Date  . Repair of torn ligament right leg    . Repair of toe laceration    . Patella fracture surgery      right  . Right foot fracture    . Appendectomy    . Tonsillectomy      reports that she has never smoked. She does not have any smokeless tobacco history on file. She reports that she does not drink alcohol. Her drug history is not on file. family history includes Cancer in her maternal grandmother. Allergies  Allergen Reactions  . Amoxicillin Rash  . Codeine     REACTION: rash  . Compazine Other (See Comments)    Hyper/jitters/blotches  . Eszopiclone Other (See Comments)    Metallic taste  . Levetiracetam     Jitters/hyper  . Moxifloxacin     REACTION: swelling and rash  . Oxcarbazepine     REACTION: immune system suppressed  . Oxcarbazepine   . Oxycodone-Acetaminophen     REACTION: rash and vomiting   . Prochlorperazine Edisylate     REACTION: hyper and jittery  . Propoxyphene Hives  . Propoxyphene N-Acetaminophen     REACTION: rash and vomiting  . Vicodin [Hydrocodone-Acetaminophen] Rash   Current Outpatient Prescriptions on File Prior to Visit  Medication Sig Dispense Refill  . cetirizine (ZYRTEC) 10 MG tablet Take 10 mg by mouth daily.    . diazepam (VALIUM) 10 MG tablet Take 10 mg by mouth every 6 (six) hours as needed for anxiety.     Marland Kitchen esomeprazole (NEXIUM) 40 MG capsule Take 40 mg by mouth daily as needed (heartburn / indigestion).     . furosemide (LASIX) 80 MG tablet Take 80 mg by mouth daily as needed for fluid.    Marland Kitchen ibuprofen (ADVIL,MOTRIN) 800 MG tablet Take 1 tablet (800 mg total) by mouth 3 (three) times daily. 21 tablet 0  . montelukast (SINGULAIR) 10 MG tablet Take 10 mg by mouth at bedtime.    . promethazine (PHENERGAN) 25 MG tablet Take 1 tablet (25 mg total) by mouth every 6 (six) hours as needed for nausea or vomiting. 12 tablet 0  . methylPREDNISolone (MEDROL DOSEPAK) 4 MG TBPK tablet     . traMADol (ULTRAM) 50 MG tablet Take 1  tablet (50 mg total) by mouth every 6 (six) hours as needed. (Patient not taking: Reported on 11/09/2014) 15 tablet 0   No current facility-administered medications on file prior to visit.   Review of Systems  Constitutional: Negative for unusual diaphoresis or night sweats HENT: Negative for ringing in ear or discharge Eyes: Negative for double vision or worsening visual disturbance.  Respiratory: Negative for choking and stridor.   Gastrointestinal: Negative for vomiting or other signifcant bowel change Genitourinary: Negative for hematuria or change in urine volume.  Musculoskeletal: Negative for other MSK pain or swelling Skin: Negative for color change and worsening wound.  Neurological: Negative for tremors and numbness other than noted  Psychiatric/Behavioral: Negative for decreased concentration or agitation other than above        Objective:   Physical Exam BP 118/74 mmHg  Pulse 80  Temp(Src) 98.4 F (36.9 C) (Oral)  SpO2 98%  LMP 10/14/2014 N/A  VS noted,  Constitutional: Pt appears in no significant distress HENT: Head: NCAT.  Right Ear: External ear normal.  Left Ear: External ear normal.  Eyes: . Pupils are equal, round, and reactive to light. Conjunctivae and EOM are normal Neck: Normal range of motion. Neck supple.  Cardiovascular: Normal rate and regular rhythm.   Pulmonary/Chest: Effort normal and breath sounds without rales or wheezing.  Abd:  Soft,  ND, + BS RUQ tender mild, no guarding or rebound Neurological: Pt is alert. Not confused , motor grossly intact Skin: Skin is warm. No rash, no LE edema Psychiatric: Pt behavior is normal. No agitation.    Data: UA neg today    Assessment & Plan:

## 2014-11-09 NOTE — Patient Instructions (Signed)
Please take all new medication as prescribed - the lyrica  Please continue all other medications as before, and refills have been done if requested.  Please have the pharmacy call with any other refills you may need.  Please continue your efforts at being more active, low cholesterol diet, and weight control.  You are otherwise up to date with prevention measures today.  Please keep your appointments with your specialists as you may have planned  Please go to the XRAY Department in the Basement (go straight as you get off the elevator) for the x-ray testing  You will be contacted by phone if any changes need to be made immediately.  Otherwise, you will receive a letter about your results with an explanation, but please check with MyChart first.  Please remember to sign up for MyChart if you have not done so, as this will be important to you in the future with finding out test results, communicating by private email, and scheduling acute appointments online when needed.

## 2014-11-09 NOTE — Assessment & Plan Note (Signed)
Pt states signficant fluid retention but none noted today, ok to follow

## 2014-11-09 NOTE — Assessment & Plan Note (Signed)
With recent wt gain, for lower calorie diet, increased activity, wt loss, cont other same tx Lab Results  Component Value Date   HGBA1C 5.9 07/10/2012

## 2014-11-09 NOTE — Progress Notes (Signed)
Pre visit review using our clinic review tool, if applicable. No additional management support is needed unless otherwise documented below in the visit note. 

## 2014-12-09 ENCOUNTER — Ambulatory Visit: Payer: Medicare HMO | Admitting: Gastroenterology

## 2015-04-14 ENCOUNTER — Ambulatory Visit: Payer: Medicare HMO | Admitting: Internal Medicine

## 2015-07-05 ENCOUNTER — Encounter (INDEPENDENT_AMBULATORY_CARE_PROVIDER_SITE_OTHER): Payer: Self-pay

## 2015-09-01 ENCOUNTER — Ambulatory Visit: Payer: Medicare HMO | Admitting: Internal Medicine

## 2015-09-01 ENCOUNTER — Ambulatory Visit (INDEPENDENT_AMBULATORY_CARE_PROVIDER_SITE_OTHER)
Admission: RE | Admit: 2015-09-01 | Discharge: 2015-09-01 | Disposition: A | Discharge status: Home / Self Care | Payer: Medicare Other | Source: Ambulatory Visit | Attending: Internal Medicine | Admitting: Internal Medicine

## 2015-09-01 ENCOUNTER — Ambulatory Visit (INDEPENDENT_AMBULATORY_CARE_PROVIDER_SITE_OTHER): Payer: Medicare Other | Admitting: Internal Medicine

## 2015-09-01 ENCOUNTER — Other Ambulatory Visit (INDEPENDENT_AMBULATORY_CARE_PROVIDER_SITE_OTHER): Payer: Medicare Other

## 2015-09-01 ENCOUNTER — Encounter: Payer: Self-pay | Admitting: Internal Medicine

## 2015-09-01 VITALS — BP 130/76 | HR 99 | Temp 99.1°F | Resp 20 | Wt 392.0 lb

## 2015-09-01 DIAGNOSIS — Z0001 Encounter for general adult medical examination with abnormal findings: Secondary | ICD-10-CM

## 2015-09-01 DIAGNOSIS — J329 Chronic sinusitis, unspecified: Secondary | ICD-10-CM | POA: Diagnosis not present

## 2015-09-01 DIAGNOSIS — Z0000 Encounter for general adult medical examination without abnormal findings: Secondary | ICD-10-CM | POA: Diagnosis not present

## 2015-09-01 DIAGNOSIS — J0140 Acute pansinusitis, unspecified: Secondary | ICD-10-CM

## 2015-09-01 DIAGNOSIS — R7302 Impaired glucose tolerance (oral): Secondary | ICD-10-CM | POA: Diagnosis not present

## 2015-09-01 DIAGNOSIS — R05 Cough: Secondary | ICD-10-CM

## 2015-09-01 DIAGNOSIS — R6889 Other general symptoms and signs: Secondary | ICD-10-CM

## 2015-09-01 DIAGNOSIS — R059 Cough, unspecified: Secondary | ICD-10-CM | POA: Insufficient documentation

## 2015-09-01 LAB — CBC WITH DIFFERENTIAL/PLATELET
BASOS ABS: 0.1 10*3/uL (ref 0.0–0.1)
Basophils Relative: 0.5 % (ref 0.0–3.0)
Eosinophils Absolute: 0.3 10*3/uL (ref 0.0–0.7)
Eosinophils Relative: 3 % (ref 0.0–5.0)
HEMATOCRIT: 38.6 % (ref 36.0–46.0)
Hemoglobin: 13 g/dL (ref 12.0–15.0)
LYMPHS PCT: 29.2 % (ref 12.0–46.0)
Lymphs Abs: 3.4 10*3/uL (ref 0.7–4.0)
MCHC: 33.6 g/dL (ref 30.0–36.0)
MCV: 86.8 fl (ref 78.0–100.0)
Monocytes Absolute: 0.9 10*3/uL (ref 0.1–1.0)
Monocytes Relative: 7.4 % (ref 3.0–12.0)
NEUTROS ABS: 7.1 10*3/uL (ref 1.4–7.7)
NEUTROS PCT: 59.9 % (ref 43.0–77.0)
Platelets: 355 10*3/uL (ref 150.0–400.0)
RBC: 4.45 Mil/uL (ref 3.87–5.11)
RDW: 13.4 % (ref 11.5–15.5)
WBC: 11.8 10*3/uL — ABNORMAL HIGH (ref 4.0–10.5)

## 2015-09-01 LAB — LIPID PANEL
Cholesterol: 167 mg/dL (ref 0–200)
HDL: 39.7 mg/dL (ref >39.00)
LDL Cholesterol: 98 mg/dL (ref 0–99)
NONHDL: 127.49
TRIGLYCERIDES: 149 mg/dL (ref 0.0–149.0)
Total CHOL/HDL Ratio: 4
VLDL: 29.8 mg/dL (ref 0.0–40.0)

## 2015-09-01 LAB — TSH: TSH: 3.18 u[IU]/mL (ref 0.35–4.50)

## 2015-09-01 LAB — URINALYSIS, ROUTINE W REFLEX MICROSCOPIC
Ketones, ur: NEGATIVE
Leukocytes, UA: NEGATIVE
Nitrite: NEGATIVE
PH: 5.5 (ref 5.0–8.0)
URINE GLUCOSE: NEGATIVE
UROBILINOGEN UA: 0.2 (ref 0.0–1.0)

## 2015-09-01 LAB — BASIC METABOLIC PANEL
BUN: 13 mg/dL (ref 6–23)
CHLORIDE: 103 meq/L (ref 96–112)
CO2: 30 mEq/L (ref 19–32)
CREATININE: 0.66 mg/dL (ref 0.40–1.20)
Calcium: 8.8 mg/dL (ref 8.4–10.5)
GFR: 101.32 mL/min (ref >60.00)
Glucose, Bld: 121 mg/dL — ABNORMAL HIGH (ref 70–99)
Potassium: 4 mEq/L (ref 3.5–5.1)
Sodium: 137 mEq/L (ref 135–145)

## 2015-09-01 LAB — HEPATIC FUNCTION PANEL
ALT: 19 U/L (ref 0–35)
AST: 16 U/L (ref 0–37)
Albumin: 4 g/dL (ref 3.5–5.2)
Alkaline Phosphatase: 86 U/L (ref 39–117)
BILIRUBIN DIRECT: 0.1 mg/dL (ref 0.0–0.3)
TOTAL PROTEIN: 7.1 g/dL (ref 6.0–8.3)
Total Bilirubin: 0.4 mg/dL (ref 0.2–1.2)

## 2015-09-01 LAB — HEMOGLOBIN A1C: Hgb A1c MFr Bld: 6.6 % — ABNORMAL HIGH (ref 4.6–6.5)

## 2015-09-01 MED ORDER — SULFAMETHOXAZOLE-TRIMETHOPRIM 800-160 MG PO TABS: 1.0000 | ORAL_TABLET | Freq: Two times a day (BID) | ORAL | Status: DC

## 2015-09-01 MED ORDER — TRAMADOL HCL 50 MG PO TABS: 50.0000 mg | ORAL_TABLET | Freq: Four times a day (QID) | ORAL | Status: DC | PRN

## 2015-09-01 NOTE — Progress Notes (Signed)
Subjective:    Patient ID: [name] Wright, female    DOB: 1966-02-26, 50 y.o.   MRN: [id number]  HPI  Here for wellness and f/u;  Overall doing ok;  Pt denies Chest pain, worsening SOB, DOE, wheezing, orthopnea, PND, worsening LE edema, palpitations, dizziness or syncope.  Pt denies neurological change such as new headache, facial or extremity weakness.  Pt denies polydipsia, polyuria, or low sugar symptoms. Pt states overall good compliance with treatment and medications, good tolerability, and has been trying to follow appropriate diet.  Pt denies worsening depressive symptoms, suicidal ideation or panic. No fever, night sweats, wt loss, loss of appetite, or other constitutional symptoms.  Pt states good ability with ADL's, has low fall risk, home safety reviewed and adequate, no other significant changes in hearing or vision, and only occasionally active with exercise.  Declines tetanus.  Also -  Here with 2-3 days acute onset fever, facial pain, pressure, headache, general weakness and malaise, and greenish d/c, with mild ST and cough with gaggin. , but pt denies chest pain, wheezing.  Has seen ENT, s/p levaquin course x 2 per Dr Ernesto Rutherford since early April 2017, saw allergist and tx with sulfa antbx and predpac but only took 1 or 2 pills of each due to percieved reaction. - made her "cortisone flush and jumpy."  Overall has gained another 14 lbs.  Has mult allergies to cough med with narcotic and tessalon no help. Past Medical History  Diagnosis Date  . Chronic pain due to trauma   . Childhood asthma   . Acute cystitis   . Head trauma     closed  . Chronic insomnia   . Dyspepsia   . Allergic rhinitis    Past Surgical History  Procedure Laterality Date  . Repair of torn ligament right leg    . Repair of toe laceration    . Patella fracture surgery      right  . Right foot fracture    . Appendectomy    . Tonsillectomy      reports that she has never smoked. She does not have any  smokeless tobacco history on file. She reports that she does not drink alcohol. Her drug history is not on file. family history includes Cancer in her maternal grandmother. Allergies  Allergen Reactions  . Amoxicillin Rash  . Codeine     REACTION: rash  . Compazine Other (See Comments)    Hyper/jitters/blotches  . Eszopiclone Other (See Comments)    Metallic taste  . Keppra [Levetiracetam] Other (See Comments)    hyperactive  . Levetiracetam     Jitters/hyper  . Moxifloxacin     REACTION: swelling and rash  . Neurontin [Gabapentin] Other (See Comments)  . Oxcarbazepine   . Oxycodone-Acetaminophen     REACTION: rash and vomiting  . Prochlorperazine Edisylate     REACTION: hyper and jittery  . Propoxyphene Hives  . Propoxyphene N-Acetaminophen     REACTION: rash and vomiting  . Trileptal [Oxcarbazepine]     REACTION: immune system suppressed  . Vicodin [Hydrocodone-Acetaminophen] Rash   Current Outpatient Prescriptions on File Prior to Visit  Medication Sig Dispense Refill  . cetirizine (ZYRTEC) 10 MG tablet Take 10 mg by mouth daily.    . diazepam (VALIUM) 10 MG tablet Take 10 mg by mouth every 6 (six) hours as needed for anxiety.     Marland Kitchen esomeprazole (NEXIUM) 40 MG capsule Take 40 mg by mouth daily as needed (heartburn /  indigestion).     . furosemide (LASIX) 80 MG tablet Take 80 mg by mouth daily as needed for fluid.    Marland Kitchen ibuprofen (ADVIL,MOTRIN) 800 MG tablet Take 1 tablet (800 mg total) by mouth 3 (three) times daily. 21 tablet 0  . montelukast (SINGULAIR) 10 MG tablet Take 10 mg by mouth at bedtime.    Marland Kitchen morphine (MSIR) 15 MG tablet Take 15 mg by mouth every 6 (six) hours as needed for severe pain.    . pregabalin (LYRICA) 50 MG capsule Take 1 capsule (50 mg total) by mouth 3 (three) times daily. 90 capsule 5  . promethazine (PHENERGAN) 25 MG tablet Take 1 tablet (25 mg total) by mouth every 6 (six) hours as needed for nausea or vomiting. 12 tablet 0   No current  facility-administered medications on file prior to visit.   Review of Systems Constitutional: Negative for increased diaphoresis, or other activity, appetite or siginficant weight change other than noted HENT: Negative for worsening hearing loss, ear pain, facial swelling, mouth sores and neck stiffness.   Eyes: Negative for other worsening pain, redness or visual disturbance.  Respiratory: Negative for choking or stridor Cardiovascular: Negative for other chest pain and palpitations.  Gastrointestinal: Negative for worsening diarrhea, blood in stool, or abdominal distention Genitourinary: Negative for hematuria, flank pain or change in urine volume.  Musculoskeletal: Negative for myalgias or other joint complaints.  Skin: Negative for other color change and wound or drainage.  Neurological: Negative for syncope and numbness. other than noted Hematological: Negative for adenopathy. or other swelling Psychiatric/Behavioral: Negative for hallucinations, SI, self-injury, decreased concentration or other worsening agitation.      Objective:   Physical Exam BP 130/76 mmHg  Pulse 99  Temp(Src) 99.1 F (37.3 C) (Oral)  Resp 20  Wt 392 lb (177.81 kg)  SpO2 98%  LMP 06/22/2015 VS noted,  Constitutional: Pt is oriented to person, place, and time. Appears well-developed and well-nourished, in no significant distress Head: Normocephalic and atraumatic  Eyes: Conjunctivae and EOM are normal. Pupils are equal, round, and reactive to light Right Ear: External ear normal.  Left Ear: External ear normal Nose: Nose normal.  Bilat tm's with mild erythema.  Max sinus areas mild tender.  Pharynx with mild erythema, no exudate Mouth/Throat: Oropharynx is clear and moist  Neck: Normal range of motion. Neck supple. No JVD present. No tracheal deviation present or significant neck LA or mass Cardiovascular: Normal rate, regular rhythm, normal heart sounds and intact distal pulses.   Pulmonary/Chest:  Effort normal and breath sounds without rales or wheezing  Abdominal: Soft. Bowel sounds are normal. NT. No HSM  Musculoskeletal: Normal range of motion. Exhibits no edema Lymphadenopathy: Has no cervical adenopathy.  Neurological: Pt is alert and oriented to person, place, and time. Pt has normal reflexes. No cranial nerve deficit. Motor grossly intact Skin: Skin is warm and dry. No rash noted or new ulcers Psychiatric:  Has nervous mood and affect. Behavior is normal.     Assessment & Plan:

## 2015-09-01 NOTE — Progress Notes (Signed)
Pre visit review using our clinic review tool, if applicable. No additional management support is needed unless otherwise documented below in the visit note. 

## 2015-09-01 NOTE — Patient Instructions (Signed)
Please take all new medication as prescribed - the 2 wks of sulfa antibiotic., and tramadol for cough  Please continue all other medications as before, and refills have been done if requested.  Please have the pharmacy call with any other refills you may need.  Please continue your efforts at being more active, low cholesterol diet, and weight control.  You are otherwise up to date with prevention measures today.  Please keep your appointments with your specialists as you may have planned  You will be contacted regarding the referral for: CT sinus - to see [facility] now  Please go to the XRAY Department in the Basement (go straight as you get off the elevator) for the x-ray testing  Please go to the LAB in the Basement (turn left off the elevator) for the tests to be done today  You will be contacted by phone if any changes need to be made immediately.  Otherwise, you will receive a letter about your results with an explanation, but please check with MyChart first.  Please remember to sign up for MyChart if you have not done so, as this will be important to you in the future with finding out test results, communicating by private email, and scheduling acute appointments online when needed.  Please return in 6 months, or sooner if needed

## 2015-09-04 NOTE — Assessment & Plan Note (Signed)

## 2015-09-04 NOTE — Assessment & Plan Note (Addendum)
With recurrent flare, for septra ds re-try,  to f/u any worsening symptoms or concerns  In addition to the time spent performing CPE, I spent an additional 40 minutes face to face,in which greater than 50% of this time was spent in counseling and coordination of care for patient's acute illness as documented.

## 2015-09-04 NOTE — Assessment & Plan Note (Signed)
With flare, for ct sinus r/o obstruction, pt is willing to re-try the septra DS as has quite limited options due to mult allergies, f/u ENT if needed

## 2015-09-04 NOTE — Assessment & Plan Note (Signed)
Exam benign but cough severe, for tramadol prn cough,  to f/u any worsening symptoms or concerns

## 2015-09-04 NOTE — Assessment & Plan Note (Signed)
stable overall by history and exam, recent data reviewed with pt, and pt to continue medical treatment as before,  to f/u any worsening symptoms or concerns Lab Results  Component Value Date   HGBA1C 6.6* 09/01/2015

## 2015-09-06 ENCOUNTER — Ambulatory Visit (INDEPENDENT_AMBULATORY_CARE_PROVIDER_SITE_OTHER)
Admission: RE | Admit: 2015-09-06 | Discharge: 2015-09-06 | Disposition: A | Discharge status: Home / Self Care | Payer: Medicare Other | Source: Ambulatory Visit | Attending: Internal Medicine | Admitting: Internal Medicine

## 2015-09-06 DIAGNOSIS — J0140 Acute pansinusitis, unspecified: Secondary | ICD-10-CM

## 2015-09-06 DIAGNOSIS — J329 Chronic sinusitis, unspecified: Secondary | ICD-10-CM

## 2015-09-15 ENCOUNTER — Telehealth: Payer: Self-pay | Admitting: Internal Medicine

## 2015-09-15 NOTE — Telephone Encounter (Signed)
Patient has appt to see dr Alain Marion on Friday----advised to go to ED if she gets really worse thru the night before tomorrow's appt

## 2015-09-15 NOTE — Telephone Encounter (Signed)
Patient Name: [name] Wright  DOB: [birth year]    Initial Comment caller states she has a sinus infection   Nurse Assessment  Nurse: Mallie Mussel, RN, Alveta Heimlich Date/Time Eilene Ghazi Time): 09/15/2015 2:18:33 PM  Confirm and document reason for call. If symptomatic, describe symptoms. You must click the next button to save text entered. ---Caller states that she has a sinus infection and has seen 4 doctors. This began in April. She has been prescribed Levaquin for 10 days and had to have another 10 days. She was seen by an allergist who prescribed a sulfa antibiotic and prednisone. She contacted her PCP. He gave her 14 day supply of generic Bactrim. She has a fever today of 99. She is speaking in complete sentences. She developed a rash all over beginning the weekend. Her fever came back on Tuesday. The fever fluctuates between 99-102. She has yellow nasal discharge. She thinks she will need IV antibiotics.  Has the patient traveled out of the country within the last 30 days? ---No  Does the patient have any new or worsening symptoms? ---Yes  Will a triage be completed? ---Yes  Related visit to physician within the last 2 weeks? ---Yes  Does the PT have any chronic conditions? (i.e. diabetes, asthma, etc.) ---No  Is the patient pregnant or possibly pregnant? (Ask all females between the ages of 46-55) ---No  Is this a behavioral health or substance abuse call? ---No     Guidelines    Guideline Title Affirmed Question Affirmed Notes  Sinus Infection on Antibiotic Follow-up Call [1] Taking antibiotic > 48 hours (2 days) AND [2] fever persists    Final Disposition User   See Physician within [place], RN, Alveta Heimlich    Comments  Dr. Jenny Reichmann is on vacation. I was able to schedule her an appointment for 2:45pm tomorrow with Dr. Walker Kehr.   Referrals  REFERRED TO PCP OFFICE   Disagree/Comply: Comply

## 2015-09-15 NOTE — Telephone Encounter (Signed)
Patient states she was allergic to antibiotic that was given to her over the weekend.  She had to stop med.  Patient states sinus infection has flared back up and she has fever.  She was concerned if shed needed to go to the hospital.  I am going to send patient to [facility] for triage.

## 2015-09-16 ENCOUNTER — Ambulatory Visit (INDEPENDENT_AMBULATORY_CARE_PROVIDER_SITE_OTHER): Payer: Medicare Other | Admitting: Internal Medicine

## 2015-09-16 ENCOUNTER — Encounter: Payer: Self-pay | Admitting: Internal Medicine

## 2015-09-16 VITALS — BP 128/80 | HR 88 | Temp 97.8°F | Wt 391.0 lb

## 2015-09-16 DIAGNOSIS — J341 Cyst and mucocele of nose and nasal sinus: Secondary | ICD-10-CM | POA: Diagnosis not present

## 2015-09-16 DIAGNOSIS — J0100 Acute maxillary sinusitis, unspecified: Secondary | ICD-10-CM

## 2015-09-16 NOTE — Patient Instructions (Signed)
Use Afrin nasal spray

## 2015-09-16 NOTE — Progress Notes (Signed)
Pre visit review using our clinic review tool, if applicable. No additional management support is needed unless otherwise documented below in the visit note. 

## 2015-09-16 NOTE — Assessment & Plan Note (Addendum)
6/17 CT: Interval enlargement of large retention cyst in the left maxillary sinus. This now measures 23 x 30 mm. Use Afrin nasal spray ENT ref

## 2015-09-16 NOTE — Progress Notes (Signed)
Subjective:  Patient ID: [name] Wright, female    DOB: [birth year]  Age: 50 y.o. MRN: [id number]  CC: No chief complaint on file.   HPI LOUCILLE MIGLIORI presents for a recurrent sinusitis sx's - the pt was treated by Dr Jenny Reichmann and Dr Lesleigh Noe. Seeing Dr Marcelline Mates  Outpatient Prescriptions Prior to Visit  Medication Sig Dispense Refill  . cetirizine (ZYRTEC) 10 MG tablet Take 10 mg by mouth daily.    . diazepam (VALIUM) 10 MG tablet Take 10 mg by mouth every 6 (six) hours as needed for anxiety.     Marland Kitchen esomeprazole (NEXIUM) 40 MG capsule Take 40 mg by mouth daily as needed (heartburn / indigestion).     . furosemide (LASIX) 80 MG tablet Take 80 mg by mouth daily as needed for fluid.    Marland Kitchen ibuprofen (ADVIL,MOTRIN) 800 MG tablet Take 1 tablet (800 mg total) by mouth 3 (three) times daily. 21 tablet 0  . montelukast (SINGULAIR) 10 MG tablet Take 10 mg by mouth at bedtime.    Marland Kitchen morphine (MSIR) 15 MG tablet Take 15 mg by mouth every 6 (six) hours as needed for severe pain.    . pregabalin (LYRICA) 50 MG capsule Take 1 capsule (50 mg total) by mouth 3 (three) times daily. 90 capsule 5  . promethazine (PHENERGAN) 25 MG tablet Take 1 tablet (25 mg total) by mouth every 6 (six) hours as needed for nausea or vomiting. 12 tablet 0  . traMADol (ULTRAM) 50 MG tablet Take 1 tablet (50 mg total) by mouth every 6 (six) hours as needed. 120 tablet 0  . sulfamethoxazole-trimethoprim (BACTRIM DS,SEPTRA DS) 800-160 MG tablet Take 1 tablet by mouth 2 (two) times daily. (Patient not taking: Reported on 09/16/2015) 28 tablet 0   No facility-administered medications prior to visit.    ROS Review of Systems  Constitutional: Negative for chills, activity change, appetite change, fatigue and unexpected weight change.  HENT: Positive for congestion, postnasal drip and sinus pressure. Negative for mouth sores and trouble swallowing.   Eyes: Negative for visual disturbance.  Respiratory: Negative for cough and  chest tightness.   Gastrointestinal: Negative for nausea and abdominal pain.  Genitourinary: Negative for frequency, difficulty urinating and vaginal pain.  Musculoskeletal: Negative for back pain and gait problem.  Skin: Negative for pallor and rash.  Neurological: Positive for headaches. Negative for dizziness, tremors, weakness and numbness.  Psychiatric/Behavioral: Negative for confusion and sleep disturbance.    Objective:  BP 128/80 mmHg  Pulse 88  Temp(Src) 97.8 F (36.6 C) (Oral)  Wt 391 lb (177.356 kg)  SpO2 98%  LMP 08/31/2015  BP Readings from Last 3 Encounters:  09/16/15 128/80  09/01/15 130/76  11/09/14 118/74    Wt Readings from Last 3 Encounters:  09/16/15 391 lb (177.356 kg)  09/01/15 392 lb (177.81 kg)  10/27/14 378 lb (171.46 kg)    Physical Exam  Constitutional: She appears well-developed. No distress.  HENT:  Head: Normocephalic.  Right Ear: External ear normal.  Left Ear: External ear normal.  Eyes: Conjunctivae are normal. Pupils are equal, round, and reactive to light. Right eye exhibits no discharge. Left eye exhibits no discharge.  Neck: Normal range of motion. Neck supple. No JVD present. No tracheal deviation present. No thyromegaly present.  Cardiovascular: Normal rate, regular rhythm and normal heart sounds.   Pulmonary/Chest: No stridor. No respiratory distress. She has no wheezes.  Abdominal: Soft. Bowel sounds are normal. She exhibits no distension and no mass.  There is no tenderness. There is no rebound and no guarding.  Musculoskeletal: She exhibits no edema or tenderness.  Lymphadenopathy:    She has no cervical adenopathy.  Neurological: She displays normal reflexes. No cranial nerve deficit. She exhibits normal muscle tone. Coordination normal.  Skin: No rash noted. No erythema.  Psychiatric: She has a normal mood and affect. Her behavior is normal. Judgment and thought content normal.  eryth nasal mucosa  Lab Results  Component  Value Date   WBC 11.8* 09/01/2015   HGB 13.0 09/01/2015   HCT 38.6 09/01/2015   PLT 355.0 09/01/2015   GLUCOSE 121* 09/01/2015   CHOL 167 09/01/2015   TRIG 149.0 09/01/2015   HDL 39.70 09/01/2015   LDLCALC 98 09/01/2015   ALT 19 09/01/2015   AST 16 09/01/2015   NA 137 09/01/2015   K 4.0 09/01/2015   CL 103 09/01/2015   CREATININE 0.66 09/01/2015   BUN 13 09/01/2015   CO2 30 09/01/2015   TSH 3.18 09/01/2015   HGBA1C 6.6* 09/01/2015    Ct Maxillofacial Wo Cm  09/06/2015  CLINICAL DATA:  Acute and chronic sinusitis. EXAM: CT MAXILLOFACIAL WITHOUT CONTRAST TECHNIQUE: Multidetector CT imaging of the maxillofacial structures was performed. Multiplanar CT image reconstructions were also generated. A small metallic BB was placed on the right temple in order to reliably differentiate right from left. COMPARISON:  Limited CT sinus 05/05/2010 FINDINGS: Interval enlargement of large retention cyst in the left maxillary sinus. This now measures 23 x 30 mm. No bony changes. This is not a dentigerous cyst as questioned previously. Remaining sinuses are clear. No other areas of mucosal edema. No air-fluid level. Ostiomeatal complex widely patent bilaterally. Nasal septum mildly deviated. Regional soft tissues normal. IMPRESSION: Enlarging mucous retention cyst in the left maxillary sinus since the prior study of 05/05/2010. Remaining sinuses are clear. Electronically Signed   By: Franchot Gallo M.D.   On: 09/06/2015 14:57    Assessment & Plan:   There are no diagnoses linked to this encounter. I have discontinued Ms. Fantroy's sulfamethoxazole-trimethoprim. I am also having her maintain her diazepam, esomeprazole, furosemide, cetirizine, montelukast, ibuprofen, promethazine, morphine, pregabalin, and traMADol.  No orders of the defined types were placed in this encounter.     Follow-up: No Follow-up on file.  Walker Kehr, MD

## 2015-09-17 NOTE — Assessment & Plan Note (Signed)
No relapse on CT

## 2015-12-01 DIAGNOSIS — J320 Chronic maxillary sinusitis: Secondary | ICD-10-CM | POA: Diagnosis not present

## 2015-12-01 DIAGNOSIS — J040 Acute laryngitis: Secondary | ICD-10-CM | POA: Diagnosis not present

## 2015-12-01 DIAGNOSIS — J322 Chronic ethmoidal sinusitis: Secondary | ICD-10-CM | POA: Diagnosis not present

## 2015-12-01 DIAGNOSIS — J301 Allergic rhinitis due to pollen: Secondary | ICD-10-CM | POA: Diagnosis not present

## 2015-12-09 DIAGNOSIS — R51 Headache: Secondary | ICD-10-CM | POA: Diagnosis not present

## 2015-12-09 DIAGNOSIS — G894 Chronic pain syndrome: Secondary | ICD-10-CM | POA: Diagnosis not present

## 2016-01-24 DIAGNOSIS — E039 Hypothyroidism, unspecified: Secondary | ICD-10-CM | POA: Diagnosis not present

## 2016-01-24 DIAGNOSIS — D649 Anemia, unspecified: Secondary | ICD-10-CM | POA: Diagnosis not present

## 2016-01-24 DIAGNOSIS — N390 Urinary tract infection, site not specified: Secondary | ICD-10-CM | POA: Diagnosis not present

## 2016-01-24 DIAGNOSIS — R300 Dysuria: Secondary | ICD-10-CM | POA: Diagnosis not present

## 2016-01-24 DIAGNOSIS — D519 Vitamin B12 deficiency anemia, unspecified: Secondary | ICD-10-CM | POA: Diagnosis not present

## 2016-01-24 DIAGNOSIS — E559 Vitamin D deficiency, unspecified: Secondary | ICD-10-CM | POA: Diagnosis not present

## 2016-04-04 DIAGNOSIS — J3081 Allergic rhinitis due to animal (cat) (dog) hair and dander: Secondary | ICD-10-CM | POA: Diagnosis not present

## 2016-04-04 DIAGNOSIS — J322 Chronic ethmoidal sinusitis: Secondary | ICD-10-CM | POA: Diagnosis not present

## 2016-04-04 DIAGNOSIS — J301 Allergic rhinitis due to pollen: Secondary | ICD-10-CM | POA: Diagnosis not present

## 2016-04-04 DIAGNOSIS — J320 Chronic maxillary sinusitis: Secondary | ICD-10-CM | POA: Diagnosis not present

## 2016-04-04 DIAGNOSIS — J040 Acute laryngitis: Secondary | ICD-10-CM | POA: Diagnosis not present

## 2016-04-12 DIAGNOSIS — J019 Acute sinusitis, unspecified: Secondary | ICD-10-CM | POA: Diagnosis not present

## 2016-04-27 DIAGNOSIS — E039 Hypothyroidism, unspecified: Secondary | ICD-10-CM | POA: Diagnosis not present

## 2016-04-27 DIAGNOSIS — Z79899 Other long term (current) drug therapy: Secondary | ICD-10-CM | POA: Diagnosis not present

## 2016-04-27 DIAGNOSIS — R7309 Other abnormal glucose: Secondary | ICD-10-CM | POA: Diagnosis not present

## 2016-05-14 ENCOUNTER — Telehealth: Payer: Self-pay | Admitting: Internal Medicine

## 2016-05-14 NOTE — Telephone Encounter (Signed)
Spoke to patient regarding awv. Patient stated that she is no longer a patient of Dr. Jenny Reichmann. Patient stated that she is now a patient with Dr. Bryon Lions.

## 2016-06-01 DIAGNOSIS — J019 Acute sinusitis, unspecified: Secondary | ICD-10-CM | POA: Diagnosis not present

## 2016-06-11 DIAGNOSIS — E039 Hypothyroidism, unspecified: Secondary | ICD-10-CM | POA: Diagnosis not present

## 2016-06-21 DIAGNOSIS — M545 Low back pain: Secondary | ICD-10-CM | POA: Diagnosis not present

## 2016-06-21 DIAGNOSIS — N3289 Other specified disorders of bladder: Secondary | ICD-10-CM | POA: Diagnosis not present

## 2016-06-21 DIAGNOSIS — E039 Hypothyroidism, unspecified: Secondary | ICD-10-CM | POA: Diagnosis not present

## 2016-07-09 DIAGNOSIS — G4733 Obstructive sleep apnea (adult) (pediatric): Secondary | ICD-10-CM | POA: Diagnosis not present

## 2016-07-09 DIAGNOSIS — G43909 Migraine, unspecified, not intractable, without status migrainosus: Secondary | ICD-10-CM | POA: Diagnosis not present

## 2016-07-09 DIAGNOSIS — R51 Headache: Secondary | ICD-10-CM | POA: Diagnosis not present

## 2016-07-09 DIAGNOSIS — G894 Chronic pain syndrome: Secondary | ICD-10-CM | POA: Diagnosis not present

## 2016-07-09 DIAGNOSIS — S0990XS Unspecified injury of head, sequela: Secondary | ICD-10-CM | POA: Diagnosis not present

## 2016-08-06 DIAGNOSIS — M542 Cervicalgia: Secondary | ICD-10-CM | POA: Diagnosis not present

## 2016-08-06 DIAGNOSIS — J309 Allergic rhinitis, unspecified: Secondary | ICD-10-CM | POA: Diagnosis not present

## 2016-08-06 DIAGNOSIS — E039 Hypothyroidism, unspecified: Secondary | ICD-10-CM | POA: Diagnosis not present

## 2016-09-17 DIAGNOSIS — R609 Edema, unspecified: Secondary | ICD-10-CM | POA: Diagnosis not present

## 2016-09-17 DIAGNOSIS — Z1389 Encounter for screening for other disorder: Secondary | ICD-10-CM | POA: Diagnosis not present

## 2016-09-17 DIAGNOSIS — R3915 Urgency of urination: Secondary | ICD-10-CM | POA: Diagnosis not present

## 2016-09-17 DIAGNOSIS — J320 Chronic maxillary sinusitis: Secondary | ICD-10-CM | POA: Diagnosis not present

## 2016-10-30 DIAGNOSIS — E559 Vitamin D deficiency, unspecified: Secondary | ICD-10-CM | POA: Diagnosis not present

## 2016-10-30 DIAGNOSIS — Z0000 Encounter for general adult medical examination without abnormal findings: Secondary | ICD-10-CM | POA: Diagnosis not present

## 2016-10-30 DIAGNOSIS — E039 Hypothyroidism, unspecified: Secondary | ICD-10-CM | POA: Diagnosis not present

## 2016-10-30 DIAGNOSIS — R7309 Other abnormal glucose: Secondary | ICD-10-CM | POA: Diagnosis not present

## 2016-11-12 DIAGNOSIS — G43009 Migraine without aura, not intractable, without status migrainosus: Secondary | ICD-10-CM | POA: Diagnosis not present

## 2016-11-12 DIAGNOSIS — G4733 Obstructive sleep apnea (adult) (pediatric): Secondary | ICD-10-CM | POA: Diagnosis not present

## 2016-11-12 DIAGNOSIS — S0990XS Unspecified injury of head, sequela: Secondary | ICD-10-CM | POA: Diagnosis not present

## 2016-11-12 DIAGNOSIS — S098XXS Other specified injuries of head, sequela: Secondary | ICD-10-CM | POA: Diagnosis not present

## 2016-11-12 DIAGNOSIS — R5383 Other fatigue: Secondary | ICD-10-CM | POA: Diagnosis not present

## 2016-11-12 DIAGNOSIS — R51 Headache: Secondary | ICD-10-CM | POA: Diagnosis not present

## 2016-11-20 DIAGNOSIS — R030 Elevated blood-pressure reading, without diagnosis of hypertension: Secondary | ICD-10-CM | POA: Diagnosis not present

## 2016-11-20 DIAGNOSIS — J309 Allergic rhinitis, unspecified: Secondary | ICD-10-CM | POA: Diagnosis not present

## 2016-11-20 DIAGNOSIS — R300 Dysuria: Secondary | ICD-10-CM | POA: Diagnosis not present

## 2017-01-17 DIAGNOSIS — J321 Chronic frontal sinusitis: Secondary | ICD-10-CM | POA: Diagnosis not present

## 2017-02-05 DIAGNOSIS — Z01419 Encounter for gynecological examination (general) (routine) without abnormal findings: Secondary | ICD-10-CM | POA: Diagnosis not present

## 2017-02-05 DIAGNOSIS — Z1231 Encounter for screening mammogram for malignant neoplasm of breast: Secondary | ICD-10-CM | POA: Diagnosis not present

## 2017-02-05 DIAGNOSIS — Z124 Encounter for screening for malignant neoplasm of cervix: Secondary | ICD-10-CM | POA: Diagnosis not present

## 2017-02-05 LAB — HM PAP SMEAR

## 2017-02-13 DIAGNOSIS — T7840XA Allergy, unspecified, initial encounter: Secondary | ICD-10-CM | POA: Diagnosis not present

## 2017-03-06 DIAGNOSIS — N920 Excessive and frequent menstruation with regular cycle: Secondary | ICD-10-CM | POA: Diagnosis not present

## 2017-03-06 DIAGNOSIS — N926 Irregular menstruation, unspecified: Secondary | ICD-10-CM | POA: Diagnosis not present

## 2017-03-11 DIAGNOSIS — G43009 Migraine without aura, not intractable, without status migrainosus: Secondary | ICD-10-CM | POA: Diagnosis not present

## 2017-03-11 DIAGNOSIS — G894 Chronic pain syndrome: Secondary | ICD-10-CM | POA: Diagnosis not present

## 2017-03-11 DIAGNOSIS — S0990XS Unspecified injury of head, sequela: Secondary | ICD-10-CM | POA: Diagnosis not present

## 2017-03-11 DIAGNOSIS — R51 Headache: Secondary | ICD-10-CM | POA: Diagnosis not present

## 2017-04-05 ENCOUNTER — Encounter (HOSPITAL_COMMUNITY): Payer: Self-pay

## 2017-04-08 ENCOUNTER — Encounter (HOSPITAL_COMMUNITY): Payer: Self-pay

## 2017-04-08 NOTE — Patient Instructions (Addendum)
Your procedure is scheduled on: Monday, Jan. 7  Enter through the [employer] [facility] at:  6 am  Pick up the phone at the desk and dial [phone].  Call this number if you have problems the morning of surgery: [phone].  Remember: Do NOT eat or Do NOT drink clear liquids (including water) after midnight Sunday  Take these medicines the morning of surgery with a SIP OF WATER: synthroid, zyrtec and valium if needed.  Do NOT wear jewelry (body piercing), metal hair clips/bobby pins, make-up, or nail polish. Do NOT wear lotions, powders, or perfumes.  You may wear deoderant. Do NOT shave for 48 hours prior to surgery. Do NOT bring valuables to the hospital.  Have a responsible adult drive you home and stay with you for 24 hours after your procedure.  Home with husband Clair Gulling cell [phone].

## 2017-04-10 ENCOUNTER — Encounter (HOSPITAL_COMMUNITY)
Admission: RE | Admit: 2017-04-10 | Discharge: 2017-04-10 | Disposition: A | Discharge status: Home / Self Care | Payer: Medicare Other | Source: Ambulatory Visit | Attending: Obstetrics and Gynecology | Admitting: Obstetrics and Gynecology

## 2017-04-10 ENCOUNTER — Encounter (HOSPITAL_COMMUNITY): Payer: Self-pay

## 2017-04-10 ENCOUNTER — Other Ambulatory Visit: Payer: Self-pay

## 2017-04-10 DIAGNOSIS — Z01812 Encounter for preprocedural laboratory examination: Secondary | ICD-10-CM | POA: Insufficient documentation

## 2017-04-10 HISTORY — DX: Personal history of urinary calculi: Z87.442

## 2017-04-10 HISTORY — DX: Other complications of anesthesia, initial encounter: T88.59XA

## 2017-04-10 HISTORY — DX: Prediabetes: R73.03

## 2017-04-10 HISTORY — DX: Nausea with vomiting, unspecified: R11.2

## 2017-04-10 HISTORY — DX: Hypothyroidism, unspecified: E03.9

## 2017-04-10 HISTORY — DX: Nausea with vomiting, unspecified: Z98.890

## 2017-04-10 HISTORY — DX: Adverse effect of unspecified anesthetic, initial encounter: T41.45XA

## 2017-04-10 LAB — CBC
HCT: 41.1 % (ref 36.0–46.0)
Hemoglobin: 13.5 g/dL (ref 12.0–15.0)
MCH: 28.7 pg (ref 26.0–34.0)
MCHC: 32.8 g/dL (ref 30.0–36.0)
MCV: 87.4 fL (ref 78.0–100.0)
PLATELETS: 325 10*3/uL (ref 150–400)
RBC: 4.7 MIL/uL (ref 3.87–5.11)
RDW: 13 % (ref 11.5–15.5)
WBC: 7.8 10*3/uL (ref 4.0–10.5)

## 2017-04-10 LAB — COMPREHENSIVE METABOLIC PANEL
ALT: 20 U/L (ref 14–54)
AST: 21 U/L (ref 15–41)
Albumin: 3.6 g/dL (ref 3.5–5.0)
Alkaline Phosphatase: 98 U/L (ref 38–126)
Anion gap: 9 (ref 5–15)
BUN: 12 mg/dL (ref 6–20)
CHLORIDE: 99 mmol/L — AB (ref 101–111)
CO2: 26 mmol/L (ref 22–32)
CREATININE: 0.66 mg/dL (ref 0.44–1.00)
Calcium: 8.5 mg/dL — ABNORMAL LOW (ref 8.9–10.3)
GFR calc Af Amer: >60 mL/min (ref >60)
GFR calc non Af Amer: >60 mL/min (ref >60)
Glucose, Bld: 111 mg/dL — ABNORMAL HIGH (ref 65–99)
Potassium: 4.1 mmol/L (ref 3.5–5.1)
SODIUM: 134 mmol/L — AB (ref 135–145)
Total Bilirubin: 0.6 mg/dL (ref 0.3–1.2)
Total Protein: 7.6 g/dL (ref 6.5–8.1)

## 2017-04-10 LAB — TYPE AND SCREEN
ABO/RH(D): O POS
ANTIBODY SCREEN: NEGATIVE

## 2017-04-10 LAB — ABO/RH: ABO/RH(D): O POS

## 2017-04-11 DIAGNOSIS — N920 Excessive and frequent menstruation with regular cycle: Secondary | ICD-10-CM | POA: Diagnosis not present

## 2017-04-14 NOTE — H&P (Signed)
[name] Wright is an 52 y.o. female with heavy menses. U/S in office show uterus measuring 8.3 x 5.0 x 5.4 cm and a 1.4 cm polypoid like structure in endometrial cavity.  Pertinent Gynecological History: Menses: flow is excessive with use of many pads or tampons on heaviest days Bleeding: dysfunctional uterine bleeding Contraception: vasectomy DES exposure: denies Blood transfusions: none Sexually transmitted diseases: no past history Previous GYN Procedures: none  Last mammogram: normal Date: 2018 Last pap: normal Date: 2018 OB History: G1, P0   Menstrual History: Menarche age: unknown No LMP recorded (lmp unknown).    Past Medical History:  Diagnosis Date  . Acute cystitis   . Allergic rhinitis   . Childhood asthma    no problems as adult - no inhaler  . Chronic insomnia   . Chronic pain due to trauma 02/2001   closed head trauma - Followed by Dr [facility]  . Complication of anesthesia   . Dyspepsia   . Head trauma 02/24/2001   closed  . History of kidney stones    passed stone - no surgery required  . Hypothyroidism   . PONV (postoperative nausea and vomiting)   . Pre-diabetes   . SVD (spontaneous vaginal delivery)    fetal demise at 5 months    Past Surgical History:  Procedure Laterality Date  . APPENDECTOMY    . INCONTINENCE SURGERY    . PATELLA FRACTURE SURGERY     patient denies this surgery  . repair of toe laceration     dermabond to right big toe right foot  . repair of torn ligament right leg     patient denies this surgery  . right foot fracture     cast only  . TONSILLECTOMY    . WISDOM TOOTH EXTRACTION      Family History  Problem Relation Age of Onset  . Cancer Maternal Grandmother        ovarion    Social History:  reports that  has never smoked. she has never used smokeless tobacco. She reports that she uses drugs. Drug: Morphine. She reports that she does not drink alcohol.  Allergies:  Allergies   Allergen Reactions  . Oxycodone-Acetaminophen Hives and Nausea And Vomiting  . Prochlorperazine Edisylate Other (See Comments)    Hyper and jitteriness  . Amoxicillin Rash and Other (See Comments)    Has patient had a PCN reaction causing immediate rash, facial/tongue/throat swelling, SOB or lightheadedness with hypotension: Unknown Has patient had a PCN reaction causing severe rash involving mucus membranes or skin necrosis: Unknown Has patient had a PCN reaction that required hospitalization: No Has patient had a PCN reaction occurring within the last 10 years: Yes If all of the above answers are "NO", then may proceed with Cephalosporin use.   . Compazine Other (See Comments)    Hyper/jitters/blotches  . Doxycycline Diarrhea and Other (See Comments)    Vomiting, headache  . Eszopiclone Other (See Comments)    Metallic taste and became un effective   . Keppra [Levetiracetam] Other (See Comments)    hyperactive  . Levetiracetam Other (See Comments)    Jitters/hyper  . Neurontin [Gabapentin] Nausea And Vomiting  . Propoxyphene Hives  . Trileptal [Oxcarbazepine] Other (See Comments)    REACTION: immune system suppressed  . Bactrim [Sulfamethoxazole-Trimethoprim] Rash  . Bupropion Rash  . Codeine Rash  . Hydrocodone-Acetaminophen Rash  . Moxifloxacin Swelling and Rash  . Propoxyphene N-Acetaminophen Nausea And Vomiting and Rash  .  Vicodin [Hydrocodone-Acetaminophen] Rash    No medications prior to admission.    Review of Systems  Constitutional: Negative for fever.  Eyes: Negative for blurred vision.    There were no vitals taken for this visit. Physical Exam  Cardiovascular: Normal rate and regular rhythm.  Respiratory: Effort normal.  GI: Soft. Bowel sounds are normal.  Morbidly obese    No results found for this or any previous visit (from the past 24 hour(s)).  No results found.  Assessment/Plan: 52 yo G1P0 with menorrhagia D/W patient H/S, D&C,  Myosure Risks reviewed including infection, organ damage, bleeding/transfusion-HIV/Hep, DVT/PE, pneumonia. Patient states she understands and agrees  Shon Millet II 04/14/2017, 9:19 PM

## 2017-04-15 ENCOUNTER — Ambulatory Visit (HOSPITAL_COMMUNITY): Payer: Medicare Other | Admitting: Anesthesiology

## 2017-04-15 ENCOUNTER — Encounter (HOSPITAL_COMMUNITY): Payer: Self-pay

## 2017-04-15 ENCOUNTER — Encounter (HOSPITAL_COMMUNITY)
Admission: RE | Disposition: A | Discharge status: Home / Self Care | Payer: Self-pay | Source: Ambulatory Visit | Attending: Obstetrics and Gynecology

## 2017-04-15 ENCOUNTER — Ambulatory Visit (HOSPITAL_COMMUNITY)
Admission: RE | Admit: 2017-04-15 | Discharge: 2017-04-15 | Disposition: A | Discharge status: Home / Self Care | Payer: Medicare Other | Source: Ambulatory Visit | Attending: Obstetrics and Gynecology | Admitting: Obstetrics and Gynecology

## 2017-04-15 DIAGNOSIS — J309 Allergic rhinitis, unspecified: Secondary | ICD-10-CM | POA: Insufficient documentation

## 2017-04-15 DIAGNOSIS — G894 Chronic pain syndrome: Secondary | ICD-10-CM | POA: Insufficient documentation

## 2017-04-15 DIAGNOSIS — R1013 Epigastric pain: Secondary | ICD-10-CM | POA: Diagnosis not present

## 2017-04-15 DIAGNOSIS — J45909 Unspecified asthma, uncomplicated: Secondary | ICD-10-CM | POA: Diagnosis not present

## 2017-04-15 DIAGNOSIS — Z8041 Family history of malignant neoplasm of ovary: Secondary | ICD-10-CM | POA: Diagnosis not present

## 2017-04-15 DIAGNOSIS — E039 Hypothyroidism, unspecified: Secondary | ICD-10-CM | POA: Diagnosis not present

## 2017-04-15 DIAGNOSIS — R7303 Prediabetes: Secondary | ICD-10-CM | POA: Diagnosis not present

## 2017-04-15 DIAGNOSIS — N920 Excessive and frequent menstruation with regular cycle: Secondary | ICD-10-CM | POA: Diagnosis not present

## 2017-04-15 DIAGNOSIS — Z885 Allergy status to narcotic agent status: Secondary | ICD-10-CM | POA: Insufficient documentation

## 2017-04-15 DIAGNOSIS — F5104 Psychophysiologic insomnia: Secondary | ICD-10-CM | POA: Diagnosis not present

## 2017-04-15 DIAGNOSIS — Z881 Allergy status to other antibiotic agents status: Secondary | ICD-10-CM | POA: Insufficient documentation

## 2017-04-15 DIAGNOSIS — Z87442 Personal history of urinary calculi: Secondary | ICD-10-CM | POA: Diagnosis not present

## 2017-04-15 DIAGNOSIS — N840 Polyp of corpus uteri: Secondary | ICD-10-CM | POA: Insufficient documentation

## 2017-04-15 DIAGNOSIS — Z880 Allergy status to penicillin: Secondary | ICD-10-CM | POA: Insufficient documentation

## 2017-04-15 DIAGNOSIS — Z882 Allergy status to sulfonamides status: Secondary | ICD-10-CM | POA: Diagnosis not present

## 2017-04-15 DIAGNOSIS — Z888 Allergy status to other drugs, medicaments and biological substances status: Secondary | ICD-10-CM | POA: Diagnosis not present

## 2017-04-15 DIAGNOSIS — K219 Gastro-esophageal reflux disease without esophagitis: Secondary | ICD-10-CM | POA: Insufficient documentation

## 2017-04-15 HISTORY — PX: DILATATION & CURETTAGE/HYSTEROSCOPY WITH MYOSURE: SHX6511

## 2017-04-15 SURGERY — DILATATION & CURETTAGE/HYSTEROSCOPY WITH MYOSURE
Anesthesia: General | Site: Vagina

## 2017-04-15 MED ORDER — SCOPOLAMINE 1 MG/3DAYS TD PT72
MEDICATED_PATCH | TRANSDERMAL | Status: AC
  Administered 2017-04-15: 1.5 mg via TRANSDERMAL
  Filled 2017-04-15: qty 1

## 2017-04-15 MED ORDER — DEXAMETHASONE SODIUM PHOSPHATE 4 MG/ML IJ SOLN
INTRAMUSCULAR | Status: AC
  Filled 2017-04-15: qty 1

## 2017-04-15 MED ORDER — LIDOCAINE HCL (CARDIAC) 20 MG/ML IV SOLN
INTRAVENOUS | Status: AC
  Filled 2017-04-15: qty 5

## 2017-04-15 MED ORDER — PROPOFOL 10 MG/ML IV BOLUS
INTRAVENOUS | Status: AC
  Filled 2017-04-15: qty 100

## 2017-04-15 MED ORDER — ONDANSETRON HCL 4 MG/2ML IJ SOLN
INTRAMUSCULAR | Status: AC
  Filled 2017-04-15: qty 2

## 2017-04-15 MED ORDER — FENTANYL CITRATE (PF) 100 MCG/2ML IJ SOLN
INTRAMUSCULAR | Status: AC
  Filled 2017-04-15: qty 2

## 2017-04-15 MED ORDER — GENTAMICIN SULFATE 40 MG/ML IJ SOLN
INTRAVENOUS | Status: AC
  Administered 2017-04-15: 118.8 mL via INTRAVENOUS
  Filled 2017-04-15: qty 12.75

## 2017-04-15 MED ORDER — MIDAZOLAM HCL 2 MG/2ML IJ SOLN
INTRAMUSCULAR | Status: DC | PRN
  Administered 2017-04-15: 2 mg via INTRAVENOUS

## 2017-04-15 MED ORDER — LACTATED RINGERS IV SOLN
INTRAVENOUS | Status: DC
  Administered 2017-04-15 (×2): via INTRAVENOUS

## 2017-04-15 MED ORDER — DEXAMETHASONE SODIUM PHOSPHATE 10 MG/ML IJ SOLN
INTRAMUSCULAR | Status: AC
  Filled 2017-04-15: qty 1

## 2017-04-15 MED ORDER — SODIUM CHLORIDE 0.9 % IV SOLN: INTRAVENOUS | Status: DC

## 2017-04-15 MED ORDER — DEXAMETHASONE SODIUM PHOSPHATE 10 MG/ML IJ SOLN
INTRAMUSCULAR | Status: DC | PRN
  Administered 2017-04-15: 10 mg via INTRAVENOUS

## 2017-04-15 MED ORDER — LIDOCAINE HCL 1 % IJ SOLN
INTRAMUSCULAR | Status: AC
  Filled 2017-04-15: qty 20

## 2017-04-15 MED ORDER — MIDAZOLAM HCL 2 MG/2ML IJ SOLN
INTRAMUSCULAR | Status: AC
  Filled 2017-04-15: qty 2

## 2017-04-15 MED ORDER — ONDANSETRON HCL 4 MG/2ML IJ SOLN
INTRAMUSCULAR | Status: DC | PRN
  Administered 2017-04-15: 4 mg via INTRAVENOUS

## 2017-04-15 MED ORDER — KETOROLAC TROMETHAMINE 30 MG/ML IJ SOLN
INTRAMUSCULAR | Status: DC | PRN
  Administered 2017-04-15: 30 mg via INTRAVENOUS

## 2017-04-15 MED ORDER — GLYCOPYRROLATE 0.2 MG/ML IJ SOLN
INTRAMUSCULAR | Status: DC | PRN
  Administered 2017-04-15: 0.2 mg via INTRAVENOUS

## 2017-04-15 MED ORDER — KETOROLAC TROMETHAMINE 30 MG/ML IJ SOLN
INTRAMUSCULAR | Status: AC
  Filled 2017-04-15: qty 1

## 2017-04-15 MED ORDER — PROPOFOL 500 MG/50ML IV EMUL
INTRAVENOUS | Status: DC | PRN
  Administered 2017-04-15: 200 ug/kg/min via INTRAVENOUS

## 2017-04-15 MED ORDER — FENTANYL CITRATE (PF) 100 MCG/2ML IJ SOLN
INTRAMUSCULAR | Status: DC | PRN
  Administered 2017-04-15 (×2): 50 ug via INTRAVENOUS

## 2017-04-15 MED ORDER — LIDOCAINE HCL (CARDIAC) 20 MG/ML IV SOLN
INTRAVENOUS | Status: DC | PRN
  Administered 2017-04-15: 100 mg via INTRAVENOUS

## 2017-04-15 MED ORDER — FENTANYL CITRATE (PF) 100 MCG/2ML IJ SOLN: 25.0000 ug | INTRAMUSCULAR | Status: DC | PRN

## 2017-04-15 MED ORDER — PROPOFOL 10 MG/ML IV BOLUS
INTRAVENOUS | Status: AC
  Filled 2017-04-15: qty 20

## 2017-04-15 MED ORDER — SOD CITRATE-CITRIC ACID 500-334 MG/5ML PO SOLN
30.0000 mL | ORAL | Status: AC
  Administered 2017-04-15: 30 mL via ORAL

## 2017-04-15 MED ORDER — GLYCOPYRROLATE 0.2 MG/ML IJ SOLN
INTRAMUSCULAR | Status: AC
  Filled 2017-04-15: qty 1

## 2017-04-15 MED ORDER — PROPOFOL 10 MG/ML IV BOLUS
INTRAVENOUS | Status: DC | PRN
  Administered 2017-04-15: 200 mg via INTRAVENOUS

## 2017-04-15 MED ORDER — LIDOCAINE HCL 1 % IJ SOLN
INTRAMUSCULAR | Status: DC | PRN
  Administered 2017-04-15: 20 mL

## 2017-04-15 MED ORDER — SCOPOLAMINE 1 MG/3DAYS TD PT72
1.0000 | MEDICATED_PATCH | Freq: Once | TRANSDERMAL | Status: DC
  Administered 2017-04-15: 1.5 mg via TRANSDERMAL

## 2017-04-15 MED ORDER — SOD CITRATE-CITRIC ACID 500-334 MG/5ML PO SOLN
ORAL | Status: AC
  Administered 2017-04-15: 30 mL via ORAL
  Filled 2017-04-15: qty 15

## 2017-04-15 SURGICAL SUPPLY — 15 items
CATH ROBINSON RED A/P 16FR (CATHETERS) ×3 IMPLANT
CONTAINER PREFILL 10% NBF 60ML (FORM) ×6 IMPLANT
DEVICE MYOSURE LITE (MISCELLANEOUS) ×3 IMPLANT
DEVICE MYOSURE REACH (MISCELLANEOUS) IMPLANT
FILTER ARTHROSCOPY CONVERTOR (FILTER) ×3 IMPLANT
GLOVE BIO SURGEON STRL SZ7.5 (GLOVE) ×6 IMPLANT
GLOVE BIOGEL PI IND STRL 7.0 (GLOVE) ×1 IMPLANT
GLOVE BIOGEL PI INDICATOR 7.0 (GLOVE) ×2
GOWN STRL REUS W/TWL LRG LVL3 (GOWN DISPOSABLE) ×6 IMPLANT
PACK VAGINAL MINOR WOMEN LF (CUSTOM PROCEDURE TRAY) ×3 IMPLANT
PAD OB MATERNITY 4.3X12.25 (PERSONAL CARE ITEMS) ×3 IMPLANT
SEAL ROD LENS SCOPE MYOSURE (ABLATOR) ×3 IMPLANT
TOWEL OR 17X24 6PK STRL BLUE (TOWEL DISPOSABLE) ×6 IMPLANT
TUBING AQUILEX INFLOW (TUBING) ×3 IMPLANT
TUBING AQUILEX OUTFLOW (TUBING) ×3 IMPLANT

## 2017-04-15 NOTE — Anesthesia Procedure Notes (Signed)
Procedure Name: LMA Insertion Date/Time: 04/15/2017 7:36 AM Performed by: Lyda Jester, CRNA Pre-anesthesia Checklist: Patient identified, Patient being monitored, Emergency Drugs available, Timeout performed and Suction available Patient Re-evaluated:Patient Re-evaluated prior to induction Oxygen Delivery Method: Circle System Utilized Preoxygenation: Pre-oxygenation with 100% oxygen Induction Type: IV induction Ventilation: Mask ventilation without difficulty LMA: LMA inserted LMA Size: 5.0 Number of attempts: 1 Placement Confirmation: positive ETCO2 and breath sounds checked- equal and bilateral

## 2017-04-15 NOTE — Op Note (Signed)
NAMEIMAGINE, NEST          ACCOUNT NO.:  [id number]  MEDICAL RECORD NO.:  [id number]  LOCATION:  PERIO                         FACILITY:  [facility]  PHYSICIAN:  Daleen Bo. Gaetano Net, M.D. DATE OF BIRTH:  [birth year]  DATE OF PROCEDURE:  04/15/2017 DATE OF DISCHARGE:                              OPERATIVE REPORT   PREOPERATIVE DIAGNOSIS:  Menorrhagia.  POSTOPERATIVE DIAGNOSIS:  Menorrhagia.  PROCEDURE:  Hysteroscopy, dilation and curettage, and MyoSure resection.  SURGEON:  Daleen Bo. Gaetano Net, M.D.  ANESTHESIA:  General with LMA.  ESTIMATED BLOOD LOSS:  Less than 50 mL.  I'S AND O'S:  Distending media 85 mL deficit.  SPECIMEN: 1. Endometrial curettings. 2. Endometrial resection, both to Pathology.  INDICATIONS AND CONSENT:  This patient is a 52 year old, G1, P0 with heavy menses.  Ultrasound is consistent with a probable endometrial polypoid-like structure.  Hysteroscopy, D and C, and MyoSure resection is discussed preoperatively.  Potential risks and complications were reviewed preoperatively including, but not limited to infection, uterine perforation, organ damage, bleeding requiring transfusion of blood products with HIV and hepatitis acquisition, DVT, PE, pneumonia, laparoscopy, laparotomy, recurrent bleeding.  The patient states she understands and agrees.  Consent was signed on the chart.  FINDINGS:  There is a 2 cm pedunculated polypoid like structure arising from the right superior portion of the endometrial cavity.  Both fallopian tube and ostia are identified.  DESCRIPTION OF PROCEDURE:  The patient was taken to the operating room. She was identified and placed in dorsal supine position.  General anesthesia was induced via LMA.  She was placed in the dorsal lithotomy position.  She was prepped and draped.  A time-out was undertaken. Bivalve speculum was placed in the vagina and the anterior cervical lip was injected with 1% lidocaine and grasped with single-tooth  tenaculum. Paracervical block was placed at 2, 4, 5, 7, 8, and 10 o'clock positions with approximately 20 mL of the same solution.  Cervix was gently progressively dilated.  Hysteroscope was then advanced under direct visualization using distending media.  The above findings were noted. MyoSure was then used to resect the polypoid mass.  Good hemostasis was noted. The cavity was then clean.  Hysteroscope was removed and sharp curettage was carried out.  Instruments were removed.  Good hemostasis was noted. All counts were correct.  The patient was awakened and taken to recovery room in stable condition.     Daleen Bo Gaetano Net, M.D.     JET/MEDQ  D:  04/15/2017  T:  04/15/2017  Job:  [id number]

## 2017-04-15 NOTE — Discharge Instructions (Signed)

## 2017-04-15 NOTE — Anesthesia Postprocedure Evaluation (Signed)
Anesthesia Post Note  Patient: [name] Wright  Procedure(s) Performed: DILATATION & CURETTAGE/HYSTEROSCOPY WITH MYOSURE POLYPECTOMY (N/A Vagina )     Patient location during evaluation: PACU Anesthesia Type: General Level of consciousness: awake and alert Pain management: pain level controlled Vital Signs Assessment: post-procedure vital signs reviewed and stable Respiratory status: spontaneous breathing, nonlabored ventilation and respiratory function stable Cardiovascular status: blood pressure returned to baseline and stable Postop Assessment: no apparent nausea or vomiting Anesthetic complications: no    Last Vitals:  Vitals:   04/15/17 0936 04/15/17 1022  BP:  (!) 150/88  Pulse: 81 81  Resp: 19 20  Temp: 36.9 C   SpO2: 98% 99%    Last Pain:  Vitals:   04/15/17 0936  TempSrc: Oral   Pain Goal: Patients Stated Pain Goal: 3 (04/15/17 0605)               Catalina Gravel

## 2017-04-15 NOTE — Transfer of Care (Signed)
Immediate Anesthesia Transfer of Care Note  Patient: [name] Wright  Procedure(s) Performed: DILATATION & CURETTAGE/HYSTEROSCOPY WITH MYOSURE POLYPECTOMY (N/A Vagina )  Patient Location: PACU  Anesthesia Type:General  Level of Consciousness: awake, alert  and oriented  Airway & Oxygen Therapy: Patient Spontanous Breathing and Patient connected to face mask oxygen  Post-op Assessment: Report given to RN, Post -op Vital signs reviewed and stable and Patient moving all extremities X 4  Post vital signs: Reviewed and stable  Last Vitals:  Vitals:   04/15/17 0605  BP: (!) 137/92  Pulse: 84  Resp: 18  Temp: 36.6 C  SpO2: 99%    Last Pain:  Vitals:   04/15/17 0605  TempSrc: Oral      Patients Stated Pain Goal: 3 (28/20/81 3887)  Complications: No apparent anesthesia complications

## 2017-04-15 NOTE — Anesthesia Preprocedure Evaluation (Addendum)
Anesthesia Evaluation  Patient identified by MRN, date of birth, ID band Patient awake    Reviewed: Allergy & Precautions, NPO status , Patient's Chart, lab work & pertinent test results  History of Anesthesia Complications (+) PONV and history of anesthetic complications  Airway Mallampati: III  TM Distance: >3 FB Neck ROM: Full    Dental  (+) Teeth Intact, Dental Advisory Given   Pulmonary asthma ,    Pulmonary exam normal breath sounds clear to auscultation       Cardiovascular negative cardio ROS Normal cardiovascular exam Rhythm:Regular Rate:Normal     Neuro/Psych negative neurological ROS     GI/Hepatic Neg liver ROS, GERD  ,  Endo/Other  Hypothyroidism Morbid obesity  Renal/GU negative Renal ROS     Musculoskeletal  (+) narcotic dependentChronic pain syndrome   Abdominal   Peds  Hematology negative hematology ROS (+)   Anesthesia Other Findings Day of surgery medications reviewed with the patient.  Reproductive/Obstetrics Menorrhagia                             Anesthesia Physical Anesthesia Plan  ASA: IV  Anesthesia Plan: General   Post-op Pain Management:    Induction: Intravenous  PONV Risk Score and Plan: 4 or greater and Scopolamine patch - Pre-op, Midazolam, Dexamethasone, Ondansetron and TIVA  Airway Management Planned: LMA  Additional Equipment:   Intra-op Plan:   Post-operative Plan: Extubation in OR  Informed Consent: I have reviewed the patients History and Physical, chart, labs and discussed the procedure including the risks, benefits and alternatives for the proposed anesthesia with the patient or authorized representative who has indicated his/her understanding and acceptance.   Dental advisory given  Plan Discussed with: CRNA  Anesthesia Plan Comments: (Risks/benefits of general anesthesia discussed with patient including risk of damage to teeth,  lips, gum, and tongue, nausea/vomiting, allergic reactions to medications, and the possibility of heart attack, stroke and death.  All patient questions answered.  Patient wishes to proceed.)       Anesthesia Quick Evaluation

## 2017-04-15 NOTE — Brief Op Note (Signed)
04/15/2017  7:59 AM  PATIENT:  [name] Wright  52 y.o. female  PRE-OPERATIVE DIAGNOSIS:  menorhagia  POST-OPERATIVE DIAGNOSIS:  menorrhagia  PROCEDURE:  Procedure(s): DILATATION & CURETTAGE/HYSTEROSCOPY WITH MYOSURE (N/A)  SURGEON:  Surgeon(s) and Role:    * Everlene Farrier, MD - Primary  PHYSICIAN ASSISTANT:   ASSISTANTS: none   ANESTHESIA:   general  EBL:  <50   BLOOD ADMINISTERED:none  DRAINS: none   LOCAL MEDICATIONS USED:  LIDOCAINE  and Amount: 20 ml  SPECIMEN:  Source of Specimen:  endometrial currettings, endometrial resection  DISPOSITION OF SPECIMEN:  PATHOLOGY  COUNTS:  YES  TOURNIQUET:  * No tourniquets in log *  DICTATION: .Other Dictation: Dictation Number [id number]  PLAN OF CARE: Discharge to home after PACU  PATIENT DISPOSITION:  PACU - hemodynamically stable.   Delay start of Pharmacological VTE agent (>24hrs) due to surgical blood loss or risk of bleeding: not applicable

## 2017-04-15 NOTE — Progress Notes (Signed)
No changes to H&P per patient history Reviewed with patient procedure-H/S, D&C, Myosure Patient states she understands and agrees

## 2017-04-16 ENCOUNTER — Encounter (HOSPITAL_COMMUNITY): Payer: Self-pay | Admitting: Obstetrics and Gynecology

## 2017-04-29 DIAGNOSIS — E039 Hypothyroidism, unspecified: Secondary | ICD-10-CM | POA: Diagnosis not present

## 2017-06-18 DIAGNOSIS — H02834 Dermatochalasis of left upper eyelid: Secondary | ICD-10-CM | POA: Diagnosis not present

## 2017-06-18 DIAGNOSIS — H0100A Unspecified blepharitis right eye, upper and lower eyelids: Secondary | ICD-10-CM | POA: Diagnosis not present

## 2017-06-18 DIAGNOSIS — H0100B Unspecified blepharitis left eye, upper and lower eyelids: Secondary | ICD-10-CM | POA: Diagnosis not present

## 2017-06-18 DIAGNOSIS — H25813 Combined forms of age-related cataract, bilateral: Secondary | ICD-10-CM | POA: Diagnosis not present

## 2017-06-18 DIAGNOSIS — H02054 Trichiasis without entropian left upper eyelid: Secondary | ICD-10-CM | POA: Diagnosis not present

## 2017-07-15 DIAGNOSIS — S098XXS Other specified injuries of head, sequela: Secondary | ICD-10-CM | POA: Diagnosis not present

## 2017-07-15 DIAGNOSIS — R5383 Other fatigue: Secondary | ICD-10-CM | POA: Diagnosis not present

## 2017-07-15 DIAGNOSIS — G4733 Obstructive sleep apnea (adult) (pediatric): Secondary | ICD-10-CM | POA: Diagnosis not present

## 2017-07-15 DIAGNOSIS — G43009 Migraine without aura, not intractable, without status migrainosus: Secondary | ICD-10-CM | POA: Diagnosis not present

## 2017-07-15 DIAGNOSIS — R202 Paresthesia of skin: Secondary | ICD-10-CM | POA: Diagnosis not present

## 2017-07-15 DIAGNOSIS — R51 Headache: Secondary | ICD-10-CM | POA: Diagnosis not present

## 2017-07-15 DIAGNOSIS — Z79891 Long term (current) use of opiate analgesic: Secondary | ICD-10-CM | POA: Diagnosis not present

## 2017-07-15 DIAGNOSIS — S0990XS Unspecified injury of head, sequela: Secondary | ICD-10-CM | POA: Diagnosis not present

## 2017-07-16 DIAGNOSIS — H02834 Dermatochalasis of left upper eyelid: Secondary | ICD-10-CM | POA: Diagnosis not present

## 2017-07-16 DIAGNOSIS — H0100B Unspecified blepharitis left eye, upper and lower eyelids: Secondary | ICD-10-CM | POA: Diagnosis not present

## 2017-07-16 DIAGNOSIS — H0100A Unspecified blepharitis right eye, upper and lower eyelids: Secondary | ICD-10-CM | POA: Diagnosis not present

## 2017-07-16 DIAGNOSIS — H52223 Regular astigmatism, bilateral: Secondary | ICD-10-CM | POA: Diagnosis not present

## 2017-07-16 DIAGNOSIS — H25813 Combined forms of age-related cataract, bilateral: Secondary | ICD-10-CM | POA: Diagnosis not present

## 2017-07-24 DIAGNOSIS — M542 Cervicalgia: Secondary | ICD-10-CM | POA: Insufficient documentation

## 2017-07-29 DIAGNOSIS — E039 Hypothyroidism, unspecified: Secondary | ICD-10-CM | POA: Diagnosis not present

## 2017-07-29 DIAGNOSIS — R7309 Other abnormal glucose: Secondary | ICD-10-CM | POA: Diagnosis not present

## 2017-07-29 DIAGNOSIS — R1084 Generalized abdominal pain: Secondary | ICD-10-CM | POA: Diagnosis not present

## 2017-07-29 DIAGNOSIS — R10811 Right upper quadrant abdominal tenderness: Secondary | ICD-10-CM | POA: Diagnosis not present

## 2017-07-29 DIAGNOSIS — G894 Chronic pain syndrome: Secondary | ICD-10-CM | POA: Diagnosis not present

## 2017-07-29 DIAGNOSIS — E559 Vitamin D deficiency, unspecified: Secondary | ICD-10-CM | POA: Diagnosis not present

## 2017-08-26 DIAGNOSIS — H02054 Trichiasis without entropian left upper eyelid: Secondary | ICD-10-CM | POA: Diagnosis not present

## 2017-08-26 DIAGNOSIS — H25813 Combined forms of age-related cataract, bilateral: Secondary | ICD-10-CM | POA: Diagnosis not present

## 2017-08-26 DIAGNOSIS — H52223 Regular astigmatism, bilateral: Secondary | ICD-10-CM | POA: Diagnosis not present

## 2017-08-26 DIAGNOSIS — H0100A Unspecified blepharitis right eye, upper and lower eyelids: Secondary | ICD-10-CM | POA: Diagnosis not present

## 2017-08-26 DIAGNOSIS — H0100B Unspecified blepharitis left eye, upper and lower eyelids: Secondary | ICD-10-CM | POA: Diagnosis not present

## 2017-08-29 DIAGNOSIS — Z881 Allergy status to other antibiotic agents status: Secondary | ICD-10-CM | POA: Diagnosis not present

## 2017-08-29 DIAGNOSIS — Z9049 Acquired absence of other specified parts of digestive tract: Secondary | ICD-10-CM | POA: Diagnosis not present

## 2017-08-29 DIAGNOSIS — Z885 Allergy status to narcotic agent status: Secondary | ICD-10-CM | POA: Diagnosis not present

## 2017-08-29 DIAGNOSIS — Z79899 Other long term (current) drug therapy: Secondary | ICD-10-CM | POA: Diagnosis not present

## 2017-08-29 DIAGNOSIS — J45909 Unspecified asthma, uncomplicated: Secondary | ICD-10-CM | POA: Diagnosis not present

## 2017-08-29 DIAGNOSIS — Z91040 Latex allergy status: Secondary | ICD-10-CM | POA: Diagnosis not present

## 2017-08-29 DIAGNOSIS — Z791 Long term (current) use of non-steroidal anti-inflammatories (NSAID): Secondary | ICD-10-CM | POA: Diagnosis not present

## 2017-08-29 DIAGNOSIS — H25811 Combined forms of age-related cataract, right eye: Secondary | ICD-10-CM | POA: Diagnosis not present

## 2017-08-29 DIAGNOSIS — Z888 Allergy status to other drugs, medicaments and biological substances status: Secondary | ICD-10-CM | POA: Diagnosis not present

## 2017-08-29 DIAGNOSIS — G894 Chronic pain syndrome: Secondary | ICD-10-CM | POA: Diagnosis not present

## 2017-08-30 DIAGNOSIS — H25812 Combined forms of age-related cataract, left eye: Secondary | ICD-10-CM | POA: Diagnosis not present

## 2017-08-30 DIAGNOSIS — H0100A Unspecified blepharitis right eye, upper and lower eyelids: Secondary | ICD-10-CM | POA: Diagnosis not present

## 2017-08-30 DIAGNOSIS — H0100B Unspecified blepharitis left eye, upper and lower eyelids: Secondary | ICD-10-CM | POA: Diagnosis not present

## 2017-08-30 DIAGNOSIS — Z961 Presence of intraocular lens: Secondary | ICD-10-CM | POA: Diagnosis not present

## 2017-08-30 DIAGNOSIS — H52223 Regular astigmatism, bilateral: Secondary | ICD-10-CM | POA: Diagnosis not present

## 2017-09-10 DIAGNOSIS — H25812 Combined forms of age-related cataract, left eye: Secondary | ICD-10-CM | POA: Diagnosis not present

## 2017-09-10 DIAGNOSIS — E079 Disorder of thyroid, unspecified: Secondary | ICD-10-CM | POA: Diagnosis not present

## 2017-09-10 DIAGNOSIS — Z881 Allergy status to other antibiotic agents status: Secondary | ICD-10-CM | POA: Diagnosis not present

## 2017-09-10 DIAGNOSIS — Z791 Long term (current) use of non-steroidal anti-inflammatories (NSAID): Secondary | ICD-10-CM | POA: Diagnosis not present

## 2017-09-10 DIAGNOSIS — H52223 Regular astigmatism, bilateral: Secondary | ICD-10-CM | POA: Diagnosis not present

## 2017-09-10 DIAGNOSIS — J45909 Unspecified asthma, uncomplicated: Secondary | ICD-10-CM | POA: Diagnosis not present

## 2017-09-10 DIAGNOSIS — Z79899 Other long term (current) drug therapy: Secondary | ICD-10-CM | POA: Diagnosis not present

## 2017-09-10 DIAGNOSIS — Z880 Allergy status to penicillin: Secondary | ICD-10-CM | POA: Diagnosis not present

## 2017-09-10 DIAGNOSIS — Z888 Allergy status to other drugs, medicaments and biological substances status: Secondary | ICD-10-CM | POA: Diagnosis not present

## 2017-09-10 DIAGNOSIS — Z885 Allergy status to narcotic agent status: Secondary | ICD-10-CM | POA: Diagnosis not present

## 2017-09-10 DIAGNOSIS — Z91040 Latex allergy status: Secondary | ICD-10-CM | POA: Diagnosis not present

## 2017-09-11 DIAGNOSIS — H0100A Unspecified blepharitis right eye, upper and lower eyelids: Secondary | ICD-10-CM | POA: Diagnosis not present

## 2017-09-11 DIAGNOSIS — H52223 Regular astigmatism, bilateral: Secondary | ICD-10-CM | POA: Diagnosis not present

## 2017-09-11 DIAGNOSIS — Z961 Presence of intraocular lens: Secondary | ICD-10-CM | POA: Diagnosis not present

## 2017-09-11 DIAGNOSIS — H0100B Unspecified blepharitis left eye, upper and lower eyelids: Secondary | ICD-10-CM | POA: Diagnosis not present

## 2017-09-11 DIAGNOSIS — H02054 Trichiasis without entropian left upper eyelid: Secondary | ICD-10-CM | POA: Diagnosis not present

## 2017-09-19 DIAGNOSIS — L03818 Cellulitis of other sites: Secondary | ICD-10-CM | POA: Diagnosis not present

## 2017-09-19 DIAGNOSIS — H9202 Otalgia, left ear: Secondary | ICD-10-CM | POA: Diagnosis not present

## 2017-10-03 DIAGNOSIS — Z961 Presence of intraocular lens: Secondary | ICD-10-CM | POA: Diagnosis not present

## 2017-10-14 DIAGNOSIS — Z961 Presence of intraocular lens: Secondary | ICD-10-CM | POA: Diagnosis not present

## 2017-10-28 DIAGNOSIS — E039 Hypothyroidism, unspecified: Secondary | ICD-10-CM | POA: Diagnosis not present

## 2017-10-28 DIAGNOSIS — R413 Other amnesia: Secondary | ICD-10-CM | POA: Diagnosis not present

## 2017-10-28 DIAGNOSIS — R7309 Other abnormal glucose: Secondary | ICD-10-CM | POA: Diagnosis not present

## 2017-10-28 DIAGNOSIS — H579 Unspecified disorder of eye and adnexa: Secondary | ICD-10-CM | POA: Diagnosis not present

## 2017-11-04 DIAGNOSIS — H02054 Trichiasis without entropian left upper eyelid: Secondary | ICD-10-CM | POA: Diagnosis not present

## 2017-11-04 DIAGNOSIS — Z961 Presence of intraocular lens: Secondary | ICD-10-CM | POA: Diagnosis not present

## 2017-11-11 DIAGNOSIS — G894 Chronic pain syndrome: Secondary | ICD-10-CM | POA: Diagnosis not present

## 2017-11-11 DIAGNOSIS — G43009 Migraine without aura, not intractable, without status migrainosus: Secondary | ICD-10-CM | POA: Diagnosis not present

## 2017-11-11 DIAGNOSIS — Z79891 Long term (current) use of opiate analgesic: Secondary | ICD-10-CM | POA: Diagnosis not present

## 2017-11-11 DIAGNOSIS — G4733 Obstructive sleep apnea (adult) (pediatric): Secondary | ICD-10-CM | POA: Diagnosis not present

## 2017-11-11 DIAGNOSIS — R51 Headache: Secondary | ICD-10-CM | POA: Diagnosis not present

## 2017-11-11 DIAGNOSIS — S0990XS Unspecified injury of head, sequela: Secondary | ICD-10-CM | POA: Diagnosis not present

## 2017-11-11 DIAGNOSIS — Z5181 Encounter for therapeutic drug level monitoring: Secondary | ICD-10-CM | POA: Diagnosis not present

## 2017-12-13 DIAGNOSIS — J019 Acute sinusitis, unspecified: Secondary | ICD-10-CM | POA: Diagnosis not present

## 2017-12-13 DIAGNOSIS — R509 Fever, unspecified: Secondary | ICD-10-CM | POA: Diagnosis not present

## 2017-12-30 ENCOUNTER — Encounter: Payer: Self-pay | Admitting: Internal Medicine

## 2017-12-30 ENCOUNTER — Ambulatory Visit (INDEPENDENT_AMBULATORY_CARE_PROVIDER_SITE_OTHER): Payer: Medicare Other | Admitting: Internal Medicine

## 2017-12-30 VITALS — BP 140/87 | HR 89 | Temp 98.5°F | Wt 371.0 lb

## 2017-12-30 DIAGNOSIS — E039 Hypothyroidism, unspecified: Secondary | ICD-10-CM | POA: Diagnosis not present

## 2017-12-30 DIAGNOSIS — R509 Fever, unspecified: Secondary | ICD-10-CM

## 2017-12-30 DIAGNOSIS — R0683 Snoring: Secondary | ICD-10-CM | POA: Diagnosis not present

## 2017-12-30 DIAGNOSIS — W57XXXS Bitten or stung by nonvenomous insect and other nonvenomous arthropods, sequela: Secondary | ICD-10-CM

## 2017-12-30 DIAGNOSIS — Z0000 Encounter for general adult medical examination without abnormal findings: Secondary | ICD-10-CM | POA: Diagnosis not present

## 2017-12-30 DIAGNOSIS — R600 Localized edema: Secondary | ICD-10-CM

## 2017-12-30 DIAGNOSIS — E559 Vitamin D deficiency, unspecified: Secondary | ICD-10-CM | POA: Diagnosis not present

## 2017-12-30 DIAGNOSIS — R7309 Other abnormal glucose: Secondary | ICD-10-CM | POA: Diagnosis not present

## 2017-12-30 DIAGNOSIS — Z6841 Body Mass Index (BMI) 40.0 and over, adult: Secondary | ICD-10-CM

## 2017-12-30 NOTE — Progress Notes (Signed)
RFV: new patient referral for  RSMF, and anaphalaxis to doxycycline  Patient ID: [name] Wright, female   DOB: April 07, 1966, 52 y.o.   MRN: [id number]  HPI [name] Wright is a 52yo F who is referred to our clinic for fevers, and  Possible tick bite - wiped a tick bite off of her arm - had been in the woods Sinus infection in late June as well but started to have throat swelling - took benadryl that improved her symptom. Didn't go to the hospital  No rash, intermittent fever and fatigue; achiness at bedtime. Legs predominantly .   Mono symptoms, with fever - when she saw her PCP  Has intermittent fevers 100-101F, yesterday. Saturday night temp of 101F, hives from wood.   Seen on 9/11, RMSF IgM +, and lyme negative, mono negative -- had been on levofloxacin    Takes sudafed daily x 45 days.   EBV - in college ( fatigue)  Outpatient Encounter Medications as of 12/30/2017  Medication Sig  . Carbinoxamine Maleate (RYVENT) 6 MG TABS Take 6 mg by mouth daily.  . cetirizine (ZYRTEC) 10 MG tablet Take 10 mg by mouth daily.  . diazepam (VALIUM) 10 MG tablet Take 10 mg by mouth every 6 (six) hours as needed for anxiety.   . furosemide (LASIX) 80 MG tablet Take 80 mg by mouth daily as needed for fluid.  Marland Kitchen ibuprofen (ADVIL,MOTRIN) 200 MG tablet Take 200-400 mg by mouth every 6 (six) hours as needed for headache or moderate pain.   Marland Kitchen levothyroxine (SYNTHROID, LEVOTHROID) 88 MCG tablet Take 88 mcg by mouth daily before breakfast.  . morphine (MSIR) 15 MG tablet Take 15 mg by mouth 4 (four) times daily as needed for severe pain.   . promethazine (PHENERGAN) 25 MG tablet Take 1 tablet (25 mg total) by mouth every 6 (six) hours as needed for nausea or vomiting.  . Pseudoephedrine HCl (SUDAFED PO) Take 1 tablet by mouth 2 (two) times daily as needed (for congestion).  . traMADol (ULTRAM) 50 MG tablet Take 1 tablet (50 mg total) by mouth every 6 (six) hours as needed.  . pregabalin (LYRICA) 50 MG  capsule Take 1 capsule (50 mg total) by mouth 3 (three) times daily. (Patient not taking: Reported on 04/03/2017)   No facility-administered encounter medications on file as of 12/30/2017.      Patient Active Problem List   Diagnosis Date Noted  . Encounter for preventative adult health care exam with abnormal findings 09/01/2015  . Sinusitis, chronic 09/01/2015  . Cough 09/01/2015  . RUQ pain 10/27/2014  . Back pain 09/17/2013  . UTI (urinary tract infection) 09/17/2013  . Urinary incontinence 09/17/2013  . Acute sinus infection 10/16/2012  . Maxillary sinus cyst 10/16/2012  . Elevated BP 07/29/2012  . Impaired glucose tolerance 07/10/2012  . Fatigue 07/10/2012  . Leg cramps 04/15/2011  . Edema 12/21/2010  . Chronic pain due to trauma 03/15/2007  . ASTHMA, CHILDHOOD 03/15/2007  . INSOMNIA, CHRONIC 11/01/2006  . ALLERGIC RHINITIS 11/01/2006  . DYSPEPSIA, CHRONIC 11/01/2006  . HEAD TRAUMA, CLOSED 11/01/2006     Health Maintenance Due  Topic Date Due  . HIV Screening  01/05/1982  . TETANUS/TDAP  09/27/2011  . PAP SMEAR  06/07/2013  . MAMMOGRAM  01/05/2017  . COLONOSCOPY  01/05/2017  . INFLUENZA VACCINE  11/07/2017    Social History   Tobacco Use  . Smoking status: Never Smoker  . Smokeless tobacco: Never Used  Substance Use Topics  .  Alcohol use: No  . Drug use: Yes    Types: Morphine    Comment: Morphine 15 mg tab 4xdaily prn  family history includes Cancer in her maternal grandmother.  Review of Systems   Constitutional: Negative for fever, chills, diaphoresis, activity change, appetite change, fatigue and unexpected weight change.  HENT: Negative for congestion, sore throat, rhinorrhea, sneezing, trouble swallowing and sinus pressure.  Eyes: Negative for photophobia and visual disturbance.  Respiratory: Negative for cough, chest tightness, shortness of breath, wheezing and stridor.  Cardiovascular: Negative for chest pain, palpitations and leg swelling.    Gastrointestinal: Negative for nausea, vomiting, abdominal pain, diarrhea, constipation, blood in stool, abdominal distention and anal bleeding.  Genitourinary: Negative for dysuria, hematuria, flank pain and difficulty urinating.  Musculoskeletal: Negative for myalgias, back pain, joint swelling, arthralgias and gait problem.  Skin: Negative for color change, pallor, rash and wound.  Neurological: Negative for dizziness, tremors, weakness and light-headedness.  Hematological: Negative for adenopathy. Does not bruise/bleed easily.  Psychiatric/Behavioral: Negative for behavioral problems, confusion, sleep disturbance, dysphoric mood, decreased concentration and agitation.    Physical Exam   BP (!) 82/49   Pulse (!) 142   Temp 98.5 F (36.9 C) (Oral)   Wt (!) 371 lb (168.3 kg)   BMI 59.88 kg/m    Constitutional:  oriented to person, place, and time. appears well-developed and well-nourished. No distress.  HENT: [place]/AT, PERRLA, no scleral icterus Mouth/Throat: Oropharynx is clear and moist. No oropharyngeal exudate.  Cardiovascular: Normal rate, regular rhythm and normal heart sounds. Exam reveals no gallop and no friction rub.  No murmur heard.  Pulmonary/Chest: Effort normal and breath sounds normal. No respiratory distress.  has no wheezes.  Neck = supple, no nuchal rigidity Abdominal: Soft. Bowel sounds are normal.  exhibits no distension. There is no tenderness.  Lymphadenopathy: no cervical adenopathy. No axillary adenopathy Neurological: alert and oriented to person, place, and time.  Skin: Skin is warm and dry. No rash noted. No erythema.  Psychiatric: a normal mood and affect.  behavior is normal.   CBC Lab Results  Component Value Date   WBC 7.8 04/10/2017   RBC 4.70 04/10/2017   HGB 13.5 04/10/2017   HCT 41.1 04/10/2017   PLT 325 04/10/2017   MCV 87.4 04/10/2017   MCH 28.7 04/10/2017   MCHC 32.8 04/10/2017   RDW 13.0 04/10/2017   LYMPHSABS 3.4 09/01/2015    MONOABS 0.9 09/01/2015   EOSABS 0.3 09/01/2015    BMET Lab Results  Component Value Date   NA 134 (L) 04/10/2017   K 4.1 04/10/2017   CL 99 (L) 04/10/2017   CO2 26 04/10/2017   GLUCOSE 111 (H) 04/10/2017   BUN 12 04/10/2017   CREATININE 0.66 04/10/2017   CALCIUM 8.5 (L) 04/10/2017   GFRNONAA >60 04/10/2017   GFRAA >60 04/10/2017     Assessment and Plan   Time course doesn't necessarily fit for RMSF. Also does not have overt symptoms currently other than nasal congestion  Will check for erhlichiosis,   Fever of unknown origin.= having fevers for the past 2 months.   Will check cmv Blood cx  Ask her to make a fever curve  Spent 60 min greater than 50% discussing tick born illnesses and next management steps

## 2017-12-30 NOTE — Patient Instructions (Signed)
Please keep a daily recording of temperature. One reading every night, and another when you feel like you are having a fever

## 2018-01-05 LAB — CULTURE, BLOOD (SINGLE)
MICRO NUMBER: 91141073
MICRO NUMBER:: 91141045

## 2018-01-05 LAB — COMPLETE METABOLIC PANEL WITH GFR
AG Ratio: 1.1 (calc) (ref 1.0–2.5)
ALT: 22 U/L (ref 6–29)
AST: 21 U/L (ref 10–35)
Albumin: 3.9 g/dL (ref 3.6–5.1)
Alkaline phosphatase (APISO): 105 U/L (ref 33–130)
BILIRUBIN TOTAL: 0.3 mg/dL (ref 0.2–1.2)
BUN: 13 mg/dL (ref 7–25)
CO2: 27 mmol/L (ref 20–32)
CREATININE: 0.67 mg/dL (ref 0.50–1.05)
Calcium: 9.1 mg/dL (ref 8.6–10.4)
Chloride: 101 mmol/L (ref 98–110)
GFR, EST AFRICAN AMERICAN: 119 mL/min/{1.73_m2} (ref >=60)
GFR, Est Non African American: 102 mL/min/{1.73_m2} (ref >=60)
GLOBULIN: 3.4 g/dL (ref 1.9–3.7)
Glucose, Bld: 139 mg/dL — ABNORMAL HIGH (ref 65–99)
Potassium: 3.8 mmol/L (ref 3.5–5.3)
SODIUM: 137 mmol/L (ref 135–146)
TOTAL PROTEIN: 7.3 g/dL (ref 6.1–8.1)

## 2018-01-05 LAB — EHRLICHIA ANTIBODY PANEL
E. CHAFFEENSIS AB IGG: <1:64 {titer}
E. CHAFFEENSIS AB IGM: <1:20 {titer}

## 2018-01-05 LAB — ROCKY MTN SPOTTED FVR ABS PNL(IGG+IGM)
RMSF IGG: NOT DETECTED
RMSF IGM: NOT DETECTED

## 2018-01-05 LAB — SEDIMENTATION RATE: SED RATE: 43 mm/h — AB (ref 0–20)

## 2018-01-05 LAB — CMV IGM: CMV IgM: <30 AU/mL

## 2018-01-05 LAB — RHEUMATOID FACTOR: Rhuematoid fact SerPl-aCnc: <14 IU/mL (ref <14)

## 2018-01-05 LAB — C-REACTIVE PROTEIN: CRP: 12.5 mg/L — ABNORMAL HIGH (ref <8.0)

## 2018-01-08 ENCOUNTER — Ambulatory Visit: Payer: Medicare Other

## 2018-01-14 ENCOUNTER — Ambulatory Visit: Payer: Medicare Other | Admitting: Internal Medicine

## 2018-01-14 ENCOUNTER — Encounter: Payer: Self-pay | Admitting: Internal Medicine

## 2018-01-14 VITALS — BP 154/90 | HR 78 | Temp 98.4°F | Ht 67.0 in | Wt 371.0 lb

## 2018-01-14 DIAGNOSIS — R600 Localized edema: Secondary | ICD-10-CM

## 2018-01-14 DIAGNOSIS — N921 Excessive and frequent menstruation with irregular cycle: Secondary | ICD-10-CM | POA: Diagnosis not present

## 2018-01-14 DIAGNOSIS — R509 Fever, unspecified: Secondary | ICD-10-CM | POA: Diagnosis not present

## 2018-01-14 NOTE — Progress Notes (Signed)
RFV: follow up for FUO  Patient ID: [name] Wright, female   DOB: Oct 16, 1965, 52 y.o.   MRN: [id number]  HPI  [name] Wright is a 52yo F who was seen 2 weeks ago for FUO, where preliminary work up revealed negative for bacterial blood cx, negative cmv or tickborne illness. She was asked to keep fever diary, she is having still ongoing fevers of 100.3-101. Almost daily for the past 2 weeks.  She recalls having Had polyp removed in January for heavy menses  -after this procedure her menstrual cycle was absent in march -- then light bleeding but since then Getting more irregular since august Now having heavy menses with clots passing x 14d. Not painful.- this month. Went to gyn/onc for evaluation  She has also noticed occ pedal edema associated with her menses   Interestingly she has felt that she hasnt had fever  in the last few days improved?  Drinks 86 oz of water per day..but noticing retention   Outpatient Encounter Medications as of 01/14/2018  Medication Sig  . Carbinoxamine Maleate (RYVENT) 6 MG TABS Take 6 mg by mouth daily.  . cetirizine (ZYRTEC) 10 MG tablet Take 10 mg by mouth daily.  . diazepam (VALIUM) 10 MG tablet Take 10 mg by mouth every 6 (six) hours as needed for anxiety.   . furosemide (LASIX) 80 MG tablet Take 80 mg by mouth daily as needed for fluid.  Marland Kitchen ibuprofen (ADVIL,MOTRIN) 200 MG tablet Take 200-400 mg by mouth every 6 (six) hours as needed for headache or moderate pain.   Marland Kitchen levothyroxine (SYNTHROID, LEVOTHROID) 88 MCG tablet Take 88 mcg by mouth daily before breakfast.  . morphine (MSIR) 15 MG tablet Take 15 mg by mouth 4 (four) times daily as needed for severe pain.   . pregabalin (LYRICA) 50 MG capsule Take 1 capsule (50 mg total) by mouth 3 (three) times daily. (Patient not taking: Reported on 04/03/2017)  . promethazine (PHENERGAN) 25 MG tablet Take 1 tablet (25 mg total) by mouth every 6 (six) hours as needed for nausea or vomiting.  . Pseudoephedrine HCl  (SUDAFED PO) Take 1 tablet by mouth 2 (two) times daily as needed (for congestion).  . traMADol (ULTRAM) 50 MG tablet Take 1 tablet (50 mg total) by mouth every 6 (six) hours as needed.   No facility-administered encounter medications on file as of 01/14/2018.      Patient Active Problem List   Diagnosis Date Noted  . Encounter for preventative adult health care exam with abnormal findings 09/01/2015  . Sinusitis, chronic 09/01/2015  . Cough 09/01/2015  . RUQ pain 10/27/2014  . Back pain 09/17/2013  . UTI (urinary tract infection) 09/17/2013  . Urinary incontinence 09/17/2013  . Acute sinus infection 10/16/2012  . Maxillary sinus cyst 10/16/2012  . Elevated BP 07/29/2012  . Impaired glucose tolerance 07/10/2012  . Fatigue 07/10/2012  . Leg cramps 04/15/2011  . Edema 12/21/2010  . Chronic pain due to trauma 03/15/2007  . ASTHMA, CHILDHOOD 03/15/2007  . INSOMNIA, CHRONIC 11/01/2006  . ALLERGIC RHINITIS 11/01/2006  . DYSPEPSIA, CHRONIC 11/01/2006  . HEAD TRAUMA, CLOSED 11/01/2006     Health Maintenance Due  Topic Date Due  . HIV Screening  01/05/1982  . TETANUS/TDAP  09/27/2011  . PAP SMEAR  06/07/2013  . MAMMOGRAM  01/05/2017  . COLONOSCOPY  01/05/2017  . INFLUENZA VACCINE  11/07/2017     Review of Systems Per hpi otherwise 12 point ros is negative Physical Exam   BP Marland Kitchen)  154/90   Pulse 78   Temp 98.4 F (36.9 C) (Oral)   Ht 5\' 7"  (1.702 m)   Wt (!) 371 lb (168.3 kg)   LMP 12/26/2017 (Exact Date)   BMI 58.11 kg/m  Physical Exam  Constitutional:  oriented to person, place, and time. appears well-developed and well-nourished. No distress.  HENT: [place]/AT, PERRLA, no scleral icterus Mouth/Throat: Oropharynx is clear and moist. No oropharyngeal exudate.  Cardiovascular: Normal rate, regular rhythm and normal heart sounds. Exam reveals no gallop and no friction rub.  No murmur heard.  Pulmonary/Chest: Effort normal and breath sounds normal. No respiratory distress.   has no wheezes.  Neck = supple, no nuchal rigidity Abdominal: Soft. Bowel sounds are normal.  exhibits no distension. There is no tenderness.  Lymphadenopathy: no cervical adenopathy. No axillary adenopathy Neurological: alert and oriented to person, place, and time.  Skin: Skin is warm and dry. No rash noted. No erythema.  Psychiatric: a normal mood and affect.  behavior is normal.   CBC Lab Results  Component Value Date   WBC 7.8 04/10/2017   RBC 4.70 04/10/2017   HGB 13.5 04/10/2017   HCT 41.1 04/10/2017   PLT 325 04/10/2017   MCV 87.4 04/10/2017   MCH 28.7 04/10/2017   MCHC 32.8 04/10/2017   RDW 13.0 04/10/2017   LYMPHSABS 3.4 09/01/2015   MONOABS 0.9 09/01/2015   EOSABS 0.3 09/01/2015    BMET Lab Results  Component Value Date   NA 137 12/30/2017   K 3.8 12/30/2017   CL 101 12/30/2017   CO2 27 12/30/2017   GLUCOSE 139 (H) 12/30/2017   BUN 13 12/30/2017   CREATININE 0.67 12/30/2017   CALCIUM 9.1 12/30/2017   GFRNONAA 102 12/30/2017   GFRAA 119 12/30/2017      Assessment and Plan    Menorrhagia = will check cbc. Has follow up with gyn and needs TV ultrasound, on 16th. Will be interesting to see. I question whether she may have septic thrombophlebitis. She doesn necessarily appear septic other than having fevers.  fuo = will do auto immune process, and fuo?- malignancy May need to consider imaging  Water retention = continues on pred 40mg  prn as she has been using it

## 2018-01-15 LAB — CBC WITH DIFFERENTIAL/PLATELET
Basophils Absolute: 93 cells/uL (ref 0–200)
Basophils Relative: 1 %
Eosinophils Absolute: 307 cells/uL (ref 15–500)
Eosinophils Relative: 3.3 %
HCT: 36.3 % (ref 35.0–45.0)
Hemoglobin: 12.2 g/dL (ref 11.7–15.5)
Lymphs Abs: 2753 cells/uL (ref 850–3900)
MCH: 29 pg (ref 27.0–33.0)
MCHC: 33.6 g/dL (ref 32.0–36.0)
MCV: 86.4 fL (ref 80.0–100.0)
MPV: 10.3 fL (ref 7.5–12.5)
Monocytes Relative: 5.9 %
NEUTROS PCT: 60.2 %
Neutro Abs: 5599 cells/uL (ref 1500–7800)
PLATELETS: 329 10*3/uL (ref 140–400)
RBC: 4.2 10*6/uL (ref 3.80–5.10)
RDW: 12.5 % (ref 11.0–15.0)
TOTAL LYMPHOCYTE: 29.6 %
WBC: 9.3 10*3/uL (ref 3.8–10.8)
WBCMIX: 549 {cells}/uL (ref 200–950)

## 2018-01-15 LAB — ANA: Anti Nuclear Antibody(ANA): NEGATIVE

## 2018-01-15 LAB — RHEUMATOID FACTOR

## 2018-01-15 LAB — LACTATE DEHYDROGENASE: LDH: 172 U/L (ref 120–250)

## 2018-01-17 ENCOUNTER — Institutional Professional Consult (permissible substitution): Payer: Self-pay | Admitting: Neurology

## 2018-01-22 DIAGNOSIS — N924 Excessive bleeding in the premenopausal period: Secondary | ICD-10-CM | POA: Diagnosis not present

## 2018-02-11 ENCOUNTER — Institutional Professional Consult (permissible substitution): Payer: Self-pay | Admitting: Neurology

## 2018-02-11 ENCOUNTER — Telehealth: Payer: Self-pay | Admitting: Neurology

## 2018-02-11 ENCOUNTER — Encounter: Payer: Self-pay | Admitting: Neurology

## 2018-02-11 NOTE — Telephone Encounter (Signed)
Patient called and cancelled apt at 8:15 am stating she was sick. No show to apt today

## 2018-02-25 ENCOUNTER — Encounter: Payer: Self-pay | Admitting: Internal Medicine

## 2018-02-25 ENCOUNTER — Ambulatory Visit (INDEPENDENT_AMBULATORY_CARE_PROVIDER_SITE_OTHER): Payer: Medicare Other | Admitting: Internal Medicine

## 2018-02-25 VITALS — BP 130/80 | HR 86 | Temp 98.2°F | Ht 67.0 in | Wt 364.4 lb

## 2018-02-25 DIAGNOSIS — R05 Cough: Secondary | ICD-10-CM | POA: Diagnosis not present

## 2018-02-25 DIAGNOSIS — R350 Frequency of micturition: Secondary | ICD-10-CM

## 2018-02-25 DIAGNOSIS — J0100 Acute maxillary sinusitis, unspecified: Secondary | ICD-10-CM

## 2018-02-25 DIAGNOSIS — R059 Cough, unspecified: Secondary | ICD-10-CM

## 2018-02-25 LAB — POCT URINALYSIS DIPSTICK
Bilirubin, UA: NEGATIVE
Glucose, UA: NEGATIVE
Ketones, UA: NEGATIVE
LEUKOCYTES UA: NEGATIVE
NITRITE UA: NEGATIVE
PROTEIN UA: NEGATIVE
RBC UA: NEGATIVE
Urobilinogen, UA: 0.2 E.U./dL
pH, UA: 5.5 (ref 5.0–8.0)

## 2018-02-25 MED ORDER — ALBUTEROL SULFATE HFA 108 (90 BASE) MCG/ACT IN AERS
2.0000 | INHALATION_SPRAY | RESPIRATORY_TRACT | 1 refills | Status: DC | PRN
Start: 2018-02-25 — End: 2021-05-24

## 2018-02-25 MED ORDER — PSEUDOEPHEDRINE HCL 30 MG PO TABS: 30.0000 mg | ORAL_TABLET | Freq: Two times a day (BID) | ORAL | 0 refills | Status: DC | PRN

## 2018-02-25 MED ORDER — CARBINOXAMINE MALEATE 6 MG PO TABS: 6.0000 mg | ORAL_TABLET | Freq: Two times a day (BID) | ORAL | 0 refills | Status: DC

## 2018-02-25 MED ORDER — IPRATROPIUM-ALBUTEROL 0.5-2.5 (3) MG/3ML IN SOLN
3.0000 mL | Freq: Once | RESPIRATORY_TRACT | Status: AC
  Administered 2018-02-25: 3 mL via RESPIRATORY_TRACT

## 2018-02-25 MED ORDER — LEVOFLOXACIN 500 MG PO TABS: 500.0000 mg | ORAL_TABLET | Freq: Every day | ORAL | 0 refills | Status: AC

## 2018-02-25 NOTE — Progress Notes (Signed)
Subjective:     Patient ID: [name] Wright , female    DOB: 12/15/1965 , 52 y.o.   MRN: [id number]   Chief Complaint  Patient presents with  . Cough    C/O congestion, green mucus, productive cough   . Urinary Frequency    HPI 1- wants urine checked for UTI. Has been having urgency and frequency x  10 days. Had been dealing with 60 day bleeding. Does admit due to her allergy medications, her mouth has been very dry and been drinking a lot more water.  2- one week ago after getting winter clothes out ws having itchy ears and rhinitis with clear mucous. As the week progressed she got worse. Friday night started coughing and felt SOB, and had chest tightness, and the production of her cough has been green to brown. She thought is was anxiety and brew in a paper bag but did not help much. She opened the window to breath cold air and this helped. She took [place]; Mucinex and sudafed and promethazine. She has history of asthma, but has not bothered her a a long time. She did not have an inhaler to try.    Past Medical History:  Diagnosis Date  . Acute cystitis   . Allergic rhinitis   . Childhood asthma    no problems as adult - no inhaler  . Chronic insomnia   . Chronic pain due to trauma 02/2001   closed head trauma - Followed by Dr [facility]  . Complication of anesthesia   . Dyspepsia   . Head trauma 02/24/2001   closed  . History of kidney stones    passed stone - no surgery required  . Hypothyroidism   . PONV (postoperative nausea and vomiting)   . Pre-diabetes   . SVD (spontaneous vaginal delivery)    fetal demise at 5 months     Family History  Problem Relation Age of Onset  . Cancer Maternal Grandmother        ovarion     Current Outpatient Medications:  .  baclofen (LIORESAL) 10 MG tablet, Take 1/2-1 tab every 8 hours prn, Disp: , Rfl:  .  Carbinoxamine Maleate (RYVENT) 6 MG TABS, Take 6 mg by mouth daily., Disp: , Rfl:  .   cetirizine (ZYRTEC) 10 MG tablet, Take 10 mg by mouth daily., Disp: , Rfl:  .  diazepam (VALIUM) 10 MG tablet, Take 10 mg by mouth every 6 (six) hours as needed for anxiety. , Disp: , Rfl:  .  diphenhydrAMINE (BENADRYL ALLERGY) 25 mg capsule, Take 25 mg by mouth every 6 (six) hours as needed., Disp: , Rfl:  .  esomeprazole (NEXIUM) 40 MG capsule, Take by mouth., Disp: , Rfl:  .  furosemide (LASIX) 80 MG tablet, Take 80 mg by mouth daily as needed for fluid., Disp: , Rfl:  .  ibuprofen (ADVIL,MOTRIN) 200 MG tablet, Take 200-400 mg by mouth every 6 (six) hours as needed for headache or moderate pain. , Disp: , Rfl:  .  levothyroxine (SYNTHROID, LEVOTHROID) 88 MCG tablet, Take 88 mcg by mouth daily before breakfast., Disp: , Rfl:  .  morphine (MSIR) 15 MG tablet, Take 15 mg by mouth 4 (four) times daily as needed for severe pain. , Disp: , Rfl:  .  promethazine (PHENERGAN) 25 MG tablet, Take 1 tablet (25 mg total) by mouth every 6 (six) hours as needed for nausea or vomiting., Disp: 12 tablet, Rfl: 0 .  Pseudoephedrine HCl (SUDAFED PO), Take 1 tablet by mouth 2 (two) times daily as needed (for congestion)., Disp: , Rfl:  .  traMADol (ULTRAM) 50 MG tablet, Take 1 tablet (50 mg total) by mouth every 6 (six) hours as needed., Disp: 120 tablet, Rfl: 0   Allergies  Allergen Reactions  . Oxycodone-Acetaminophen Hives and Nausea And Vomiting  . Prochlorperazine Edisylate Other (See Comments)    Hyper and jitteriness  . Amoxicillin Rash and Other (See Comments)    Has patient had a PCN reaction causing immediate rash, facial/tongue/throat swelling, SOB or lightheadedness with hypotension: Unknown Has patient had a PCN reaction causing severe rash involving mucus membranes or skin necrosis: Unknown Has patient had a PCN reaction that required hospitalization: No Has patient had a PCN reaction occurring within the last 10 years: Yes If all of the above answers are "NO", then may proceed with Cephalosporin  use.   . Compazine Other (See Comments)    Hyper/jitters/blotches  . Doxycycline Diarrhea and Other (See Comments)    Vomiting, headache  . Eszopiclone Other (See Comments)    Metallic taste and became un effective   . Keppra [Levetiracetam] Other (See Comments)    hyperactive  . Levetiracetam Other (See Comments)    Jitters/hyper  . Neurontin [Gabapentin] Nausea And Vomiting  . Propoxyphene Hives  . Trileptal [Oxcarbazepine] Other (See Comments)    REACTION: immune system suppressed  . Bactrim [Sulfamethoxazole-Trimethoprim] Rash  . Bupropion Rash  . Codeine Rash  . Hydrocodone-Acetaminophen Rash  . Moxifloxacin Swelling and Rash  . Propoxyphene N-Acetaminophen Nausea And Vomiting and Rash  . Vicodin [Hydrocodone-Acetaminophen] Rash     Review of Systems  Constitutional: Negative for chills, diaphoresis and fever.  HENT: Positive for congestion, nosebleeds, postnasal drip and sinus pain. Negative for ear discharge, ear pain, sore throat, trouble swallowing and voice change.   Eyes: Negative for discharge.  Respiratory: Positive for cough. Negative for shortness of breath and wheezing.   Cardiovascular: Negative for chest pain and palpitations.  Genitourinary: Positive for frequency and urgency. Negative for dysuria, flank pain and hematuria.  Skin: Negative for rash.  Allergic/Immunologic: Positive for environmental allergies.  Neurological: Negative for headaches.  Hematological: Negative for adenopathy.     Today's Vitals   02/25/18 0959  BP: 130/80  Pulse: 86  Temp: 98.2 F (36.8 C)  TempSrc: Oral  SpO2: 96%  Weight: (!) 364 lb 6.4 oz (165.3 kg)  Height: 5\' 7"  (1.702 m)   Body mass index is 57.07 kg/m.   Objective:  Physical Exam  Constitutional: She is oriented to person, place, and time. She appears well-developed and well-nourished. No distress.  HENT:  Head: Normocephalic and atraumatic.  Right Ear: External ear normal.  Left Ear: External ear normal.   Nose: Nose normal.  Mouth/Throat: Oropharynx is clear and moist. No oropharyngeal exudate.  Has tender R maxillary and frontal sinus.   Eyes: Conjunctivae are normal. Right eye exhibits no discharge. Left eye exhibits no discharge. No scleral icterus.  Neck: Neck supple. No thyromegaly present.  R upper cervical chain nose is a little enlarged and tender  Cardiovascular: Normal rate and regular rhythm.  No murmur heard. Pulmonary/Chest: Effort normal and breath sounds normal. She has no wheezes. She has no rales.  Lymphadenopathy:    She has cervical adenopathy.  Neurological: She is alert and oriented to person, place, and time.  Skin: Skin is warm and dry. She is not diaphoretic.  Psychiatric: She has a normal mood and  affect. Her behavior is normal. Judgment and thought content normal.  Nursing note and vitals reviewed.   Assessment And Plan:     1. Urinary frequency-  - POCT Urinalysis Dipstick (81002)- negative  2. Cough- Possibly having reactive airway disease from allergies.  - ipratropium-albuterol (DUONEB) 0.5-2.5 (3) MG/3ML nebulizer solution 3 mL - albuterol (PROAIR HFA) 108 (90 Base) MCG/ACT inhaler; Inhale 2 puffs into the lungs every 4 (four) hours as needed for wheezing or shortness of breath.  Dispense: 1 Inhaler; Refill: 1 Post Neb her pulse stayed at 95% FU in 2 days.  3. Acute maxillary sinusitis, recurrence not specified- placed on Levaquin x 7 days as noted, since this is what normally helps. Advised to try Netie Pot rinses bid to prevent allergies from flairing and prevent future sinus infections.    Dally Oshel RODRIGUEZ-SOUTHWORTH, PA-C

## 2018-02-25 NOTE — Patient Instructions (Signed)
IF YOU HAVEN'T TRIED YET, TRY NETIE POT SALINE RINSES TWICE A DAY CLEAN OUT ALLERGENS.   ALSO TRY TO AVOID INFLAMMATORY FOODS LIKE: DAIRY, WHEAT, GLUTEN, SUGAR, ALCOHOL AND CAFFEINE.   TAKE PROBIOTICS FOR AT LEAST 3 MONTHS WHEN DONE TAKING THE ANTIBIOTIC SINCE ANTIBIOTICS KILL YOUR GOOD BACTERIA.    Sinusitis, Adult Sinusitis is soreness and inflammation of your sinuses. Sinuses are hollow spaces in the bones around your face. They are located:  Around your eyes.  In the middle of your forehead.  Behind your nose.  In your cheekbones.  Your sinuses and nasal passages are lined with a stringy fluid (mucus). Mucus normally drains out of your sinuses. When your nasal tissues get inflamed or swollen, the mucus can get trapped or blocked so air cannot flow through your sinuses. This lets bacteria, viruses, and funguses grow, and that leads to infection. Follow these instructions at home: Medicines  Take, use, or apply over-the-counter and prescription medicines only as told by your doctor. These may include nasal sprays.  If you were prescribed an antibiotic medicine, take it as told by your doctor. Do not stop taking the antibiotic even if you start to feel better. Hydrate and Humidify  Drink enough water to keep your pee (urine) clear or pale yellow.  Use a cool mist humidifier to keep the humidity level in your home above 50%.  Breathe in steam for 10-15 minutes, 3-4 times a day or as told by your doctor. You can do this in the bathroom while a hot shower is running.  Try not to spend time in cool or dry air. Rest  Rest as much as possible.  Sleep with your head raised (elevated).  Make sure to get enough sleep each night. General instructions  Put a warm, moist washcloth on your face 3-4 times a day or as told by your doctor. This will help with discomfort.  Wash your hands often with soap and water. If there is no soap and water, use hand sanitizer.  Do not smoke.  Avoid being around people who are smoking (secondhand smoke).  Keep all follow-up visits as told by your doctor. This is important. Contact a doctor if:  You have a fever.  Your symptoms get worse.  Your symptoms do not get better within 10 days. Get help right away if:  You have a very bad headache.  You cannot stop throwing up (vomiting).  You have pain or swelling around your face or eyes.  You have trouble seeing.  You feel confused.  Your neck is stiff.  You have trouble breathing. This information is not intended to replace advice given to you by your health care provider. Make sure you discuss any questions you have with your health care provider. Document Released: 09/12/2007 Document Revised: 11/20/2015 Document Reviewed: 01/19/2015 Elsevier Interactive Patient Education  [employer].

## 2018-02-27 ENCOUNTER — Ambulatory Visit (INDEPENDENT_AMBULATORY_CARE_PROVIDER_SITE_OTHER): Payer: Medicare Other | Admitting: Internal Medicine

## 2018-02-27 ENCOUNTER — Encounter: Payer: Self-pay | Admitting: Internal Medicine

## 2018-02-27 VITALS — BP 138/80 | HR 78 | Temp 97.8°F | Ht 67.0 in | Wt 362.8 lb

## 2018-02-27 DIAGNOSIS — J309 Allergic rhinitis, unspecified: Secondary | ICD-10-CM | POA: Diagnosis not present

## 2018-02-27 DIAGNOSIS — R05 Cough: Secondary | ICD-10-CM

## 2018-02-27 DIAGNOSIS — J40 Bronchitis, not specified as acute or chronic: Secondary | ICD-10-CM

## 2018-02-27 DIAGNOSIS — R059 Cough, unspecified: Secondary | ICD-10-CM

## 2018-02-27 MED ORDER — IPRATROPIUM-ALBUTEROL 0.5-2.5 (3) MG/3ML IN SOLN
3.0000 mL | Freq: Once | RESPIRATORY_TRACT | Status: AC
  Administered 2018-02-27: 3 mL via RESPIRATORY_TRACT

## 2018-02-27 NOTE — Progress Notes (Signed)
Subjective:     Patient ID: [name] Wright, female   DOB: 16-Jan-1966, 52 y.o.   MRN: [id number] Pt comes in today for FU cough and pulse ox. States the inhaler is helping, but she still fells some chest pressure. Would like another neb treatment since she felt this really helped her.   Needs RyVent authorized since now her insurance wont pay for this  Past Medical History:  Diagnosis Date  . Acute cystitis   . Allergic rhinitis   . Childhood asthma    no problems as adult - no inhaler  . Chronic insomnia   . Chronic pain due to trauma 02/2001   closed head trauma - Followed by [facility]  . Complication of anesthesia   . Dyspepsia   . Head trauma 02/24/2001   closed  . History of kidney stones    passed stone - no surgery required  . Hypothyroidism   . PONV (postoperative nausea and vomiting)   . Pre-diabetes   . SVD (spontaneous vaginal delivery)    fetal demise at 5 months    Oxycodone-acetaminophen; Prochlorperazine edisylate; Amoxicillin; Compazine; Doxycycline; Eszopiclone; Keppra [levetiracetam]; Levetiracetam; Neurontin [gabapentin]; Propoxyphene; Trileptal [oxcarbazepine]; Bactrim [sulfamethoxazole-trimethoprim]; Bupropion; Codeine; Hydrocodone-acetaminophen; Moxifloxacin; Propoxyphene n-acetaminophen; and Vicodin [hydrocodone-acetaminophen]  Outpatient Medications Prior to Visit  Medication Sig Dispense Refill  . albuterol (PROAIR HFA) 108 (90 Base) MCG/ACT inhaler Inhale 2 puffs into the lungs every 4 (four) hours as needed for wheezing or shortness of breath. 1 Inhaler 1  . baclofen (LIORESAL) 10 MG tablet Take 1/2-1 tab every 8 hours prn    . Carbinoxamine Maleate (RYVENT) 6 MG TABS Take 6 mg by mouth 2 (two) times daily. 180 tablet 0  . cetirizine (ZYRTEC) 10 MG tablet Take 10 mg by mouth daily.    . diazepam (VALIUM) 10 MG tablet Take 10 mg by mouth every 6 (six) hours as needed for anxiety.     . diphenhydrAMINE (BENADRYL  ALLERGY) 25 mg capsule Take 25 mg by mouth every 6 (six) hours as needed.    Marland Kitchen esomeprazole (NEXIUM) 40 MG capsule Take by mouth.    . furosemide (LASIX) 80 MG tablet Take 80 mg by mouth daily as needed for fluid.    Marland Kitchen ibuprofen (ADVIL,MOTRIN) 200 MG tablet Take 200-400 mg by mouth every 6 (six) hours as needed for headache or moderate pain.     Marland Kitchen levofloxacin (LEVAQUIN) 500 MG tablet Take 1 tablet (500 mg total) by mouth daily for 10 days. 7 tablet 0  . levothyroxine (SYNTHROID, LEVOTHROID) 88 MCG tablet Take 88 mcg by mouth daily before breakfast.    . morphine (MSIR) 15 MG tablet Take 15 mg by mouth 4 (four) times daily as needed for severe pain.     Marland Kitchen PROAIR [place] 379 (90 Base) MCG/ACT AEPB INHALE 2 PUFFS BY MOUTH EVERY 4 HOURS INTO THE LUNGS AS NEEDED FOR WHEEZING OR SHORTNESS OF BREATH  0  . promethazine (PHENERGAN) 25 MG tablet Take 1 tablet (25 mg total) by mouth every 6 (six) hours as needed for nausea or vomiting. 12 tablet 0  . promethazine (PHENERGAN) 25 MG tablet TAKE 1 TO 2 TABLETS BY MOUTH EVERY 6 HOURS AS NEEDED    . pseudoephedrine (SUDAFED) 30 MG tablet Take 1 tablet (30 mg total) by mouth 2 (two) times daily as needed (for congestion). 90 tablet 0  . traMADol (ULTRAM) 50 MG tablet Take 1 tablet (50 mg total) by mouth every 6 (  six) hours as needed. 120 tablet 0   No facility-administered medications prior to visit.          Review of Systems  Constitutional: Negative for chills, diaphoresis and fever.  HENT: Positive for postnasal drip and sinus pressure.   Eyes: Negative for discharge.  Respiratory: Positive for cough. Negative for shortness of breath and wheezing.   Cardiovascular: Negative for chest pain.  Gastrointestinal: Negative for nausea.  Skin: Negative for rash.       Objective:   Physical Exam  Constitutional: She appears well-developed and well-nourished. No distress.  HENT:  Head: Normocephalic.  Right Ear: External ear normal.  Left Ear:  External ear normal.  Nose: Nose normal.  Eyes: Conjunctivae are normal. No scleral icterus.  Neck: Neck supple.  Cardiovascular: Normal rate and regular rhythm.  No murmur heard. Pulmonary/Chest: Effort normal and breath sounds normal.  Lymphadenopathy:    She has no cervical adenopathy.  Skin: Skin is warm and dry. No rash noted. She is not diaphoretic.  Psychiatric: She has a normal mood and affect. Her behavior is normal. Judgment and thought content normal.  Nursing note and vitals reviewed.  Today's Vitals   02/27/18 1134  BP: 138/80  Pulse: 78  Temp: 97.8 F (36.6 C)  TempSrc: Oral  SpO2: 98%  Weight: (!) 362 lb 12.8 oz (164.6 kg)  Height: 5\' 7"  (1.702 m)   Body mass index is 56.82 kg/m.      Assessment:      1. Cough/ Bronchitis- unresolved but improving. Pulse ox has improved.    - ipratropium-albuterol (DUONEB) 0.5-2.5 (3) MG/3ML nebulizer solution 3 mL   2- Chronic allergies- stable on current meds.     Plan:    Pt has tried the following medications before RyVent trial. This is the only thing that helps.  Benadryl- did not work Zyrtec- did not help with acute symptoms Claritin- des not work Human resources officer- did not work She will complete the antibiotic course and use the inhaler prn.  No orders of the defined types were placed in this encounter.

## 2018-03-12 ENCOUNTER — Ambulatory Visit (INDEPENDENT_AMBULATORY_CARE_PROVIDER_SITE_OTHER): Payer: Medicare Other | Admitting: Internal Medicine

## 2018-03-12 ENCOUNTER — Encounter: Payer: Self-pay | Admitting: Internal Medicine

## 2018-03-12 VITALS — BP 132/78 | HR 80 | Temp 98.2°F | Wt 366.4 lb

## 2018-03-12 DIAGNOSIS — R197 Diarrhea, unspecified: Secondary | ICD-10-CM

## 2018-03-12 DIAGNOSIS — J0101 Acute recurrent maxillary sinusitis: Secondary | ICD-10-CM | POA: Diagnosis not present

## 2018-03-12 MED ORDER — LEVOFLOXACIN 500 MG PO TABS: 500.0000 mg | ORAL_TABLET | Freq: Every day | ORAL | 0 refills | Status: AC

## 2018-03-12 NOTE — Patient Instructions (Signed)
Diarrhea, Adult °Diarrhea is when you have loose and water poop (stool) often. Diarrhea can make you feel weak and cause you to get dehydrated. Dehydration can make you tired and thirsty, make you have a dry mouth, and make it so you pee (urinate) less often. Diarrhea often lasts 2-3 days. However, it can last longer if it is a sign of something more serious. It is important to treat your diarrhea as told by your doctor. °Follow these instructions at home: °Eating and drinking ° °Follow these recommendations as told by your doctor: °· Take an oral rehydration solution (ORS). This is a drink that is sold at pharmacies and stores. °· Drink clear fluids, such as: °? Water. °? Ice chips. °? Diluted fruit juice. °? Low-calorie sports drinks. °· Eat bland, easy-to-digest foods in small amounts as you are able. These foods include: °? Bananas. °? Applesauce. °? Rice. °? Low-fat (lean) meats. °? Toast. °? Crackers. °· Avoid drinking fluids that have a lot of sugar or caffeine in them. °· Avoid alcohol. °· Avoid spicy or fatty foods. ° °General instructions ° °· Drink enough fluid to keep your pee (urine) clear or pale yellow. °· Wash your hands often. If you cannot use soap and water, use hand sanitizer. °· Make sure that all people in your home wash their hands well and often. °· Take over-the-counter and prescription medicines only as told by your doctor. °· Rest at home while you get better. °· Watch your condition for any changes. °· Take a warm bath to help with any burning or pain from having diarrhea. °· Keep all follow-up visits as told by your doctor. This is important. °Contact a doctor if: °· You have a fever. °· Your diarrhea gets worse. °· You have new symptoms. °· You cannot keep fluids down. °· You feel light-headed or dizzy. °· You have a headache. °· You have muscle cramps. °Get help right away if: °· You have chest pain. °· You feel very weak or you pass out (faint). °· You have bloody or black poop or  poop that look like tar. °· You have very bad pain, cramping, or bloating in your belly (abdomen). °· You have trouble breathing or you are breathing very quickly. °· Your heart is beating very quickly. °· Your skin feels cold and clammy. °· You feel confused. °· You have signs of dehydration, such as: °? Dark pee, hardly any pee, or no pee. °? Cracked lips. °? Dry mouth. °? Sunken eyes. °? Sleepiness. °? Weakness. °This information is not intended to replace advice given to you by your health care provider. Make sure you discuss any questions you have with your health care provider. °Document Released: 09/12/2007 Document Revised: 10/14/2015 Document Reviewed: 11/30/2014 °Elsevier Interactive Patient Education © 2018 [employer] ° °

## 2018-03-19 ENCOUNTER — Ambulatory Visit: Payer: Medicare Other | Admitting: Internal Medicine

## 2018-03-27 NOTE — Progress Notes (Deleted)
NA Subjective:     Patient ID: [name] Wright , female    DOB: [birth year] , 52 y.o.   MRN: [id number]   Chief Complaint  Patient presents with  . Diarrhea    thought she had food poisoning. vomitted all day friday diarrhea started saturday and its like water stomach cramping has been taking immodiun hasnt gone today   . Sinus Problem    came and was only given 7 pills for a 10 day rx. color of mucus is yellow/brown and chest feels tight    HPI 2-N/V/D since Friday. Diarrhea still going. Been taking Imodium. No BM today. 2- Her sinuses is not all the way better.  HPI   Past Medical History:  Diagnosis Date  . Acute cystitis   . Allergic rhinitis   . Childhood asthma    no problems as adult - no inhaler  . Chronic insomnia   . Chronic pain due to trauma 02/2001   closed head trauma - Followed by Dr [facility]  . Complication of anesthesia   . Dyspepsia   . Head trauma 02/24/2001   closed  . History of kidney stones    passed stone - no surgery required  . Hypothyroidism   . PONV (postoperative nausea and vomiting)   . Pre-diabetes   . SVD (spontaneous vaginal delivery)    fetal demise at 5 months     Family History  Problem Relation Age of Onset  . Cancer Maternal Grandmother        ovarion     Current Outpatient Medications:  .  albuterol (PROAIR HFA) 108 (90 Base) MCG/ACT inhaler, Inhale 2 puffs into the lungs every 4 (four) hours as needed for wheezing or shortness of breath., Disp: 1 Inhaler, Rfl: 1 .  baclofen (LIORESAL) 10 MG tablet, Take 1/2-1 tab every 8 hours prn, Disp: , Rfl:  .  Carbinoxamine Maleate (RYVENT) 6 MG TABS, Take 6 mg by mouth 2 (two) times daily., Disp: 180 tablet, Rfl: 0 .  cetirizine (ZYRTEC) 10 MG tablet, Take 10 mg by mouth daily., Disp: , Rfl:  .  diazepam (VALIUM) 10 MG tablet, Take 10 mg by mouth every 6 (six) hours as needed for anxiety. , Disp: , Rfl:  .  diphenhydrAMINE (BENADRYL ALLERGY) 25 mg  capsule, Take 25 mg by mouth every 6 (six) hours as needed., Disp: , Rfl:  .  esomeprazole (NEXIUM) 40 MG capsule, Take by mouth., Disp: , Rfl:  .  furosemide (LASIX) 80 MG tablet, Take 80 mg by mouth daily as needed for fluid., Disp: , Rfl:  .  ibuprofen (ADVIL,MOTRIN) 200 MG tablet, Take 200-400 mg by mouth every 6 (six) hours as needed for headache or moderate pain. , Disp: , Rfl:  .  levothyroxine (SYNTHROID, LEVOTHROID) 88 MCG tablet, Take 88 mcg by mouth daily before breakfast., Disp: , Rfl:  .  morphine (MSIR) 15 MG tablet, Take 15 mg by mouth 4 (four) times daily as needed for severe pain. , Disp: , Rfl:  .  PROAIR RESPICLICK 109 (90 Base) MCG/ACT AEPB, INHALE 2 PUFFS BY MOUTH EVERY 4 HOURS INTO THE LUNGS AS NEEDED FOR WHEEZING OR SHORTNESS OF BREATH, Disp: , Rfl: 0 .  promethazine (PHENERGAN) 25 MG tablet, Take 1 tablet (25 mg total) by mouth every 6 (six) hours as needed for nausea or vomiting., Disp: 12 tablet, Rfl: 0 .  promethazine (PHENERGAN) 25 MG tablet, TAKE 1 TO 2 TABLETS BY MOUTH  EVERY 6 HOURS AS NEEDED, Disp: , Rfl:  .  pseudoephedrine (SUDAFED) 30 MG tablet, Take 1 tablet (30 mg total) by mouth 2 (two) times daily as needed (for congestion)., Disp: 90 tablet, Rfl: 0 .  traMADol (ULTRAM) 50 MG tablet, Take 1 tablet (50 mg total) by mouth every 6 (six) hours as needed., Disp: 120 tablet, Rfl: 0   Allergies  Allergen Reactions  . Oxycodone-Acetaminophen Hives and Nausea And Vomiting  . Prochlorperazine Edisylate Other (See Comments)    Hyper and jitteriness  . Amoxicillin Rash and Other (See Comments)    Has patient had a PCN reaction causing immediate rash, facial/tongue/throat swelling, SOB or lightheadedness with hypotension: Unknown Has patient had a PCN reaction causing severe rash involving mucus membranes or skin necrosis: Unknown Has patient had a PCN reaction that required hospitalization: No Has patient had a PCN reaction occurring within the last 10 years: Yes If  all of the above answers are "NO", then may proceed with Cephalosporin use.   . Compazine Other (See Comments)    Hyper/jitters/blotches  . Doxycycline Diarrhea and Other (See Comments)    Vomiting, headache  . Eszopiclone Other (See Comments)    Metallic taste and became un effective   . Keppra [Levetiracetam] Other (See Comments)    hyperactive  . Levetiracetam Other (See Comments)    Jitters/hyper  . Neurontin [Gabapentin] Nausea And Vomiting  . Propoxyphene Hives  . Trileptal [Oxcarbazepine] Other (See Comments)    REACTION: immune system suppressed  . Bactrim [Sulfamethoxazole-Trimethoprim] Rash  . Bupropion Rash  . Codeine Rash  . Hydrocodone-Acetaminophen Rash  . Moxifloxacin Swelling and Rash  . Propoxyphene N-Acetaminophen Nausea And Vomiting and Rash  . Vicodin [Hydrocodone-Acetaminophen] Rash     Review of Systems   Today's Vitals   03/12/18 1019  BP: 132/78  Pulse: 80  Temp: 98.2 F (36.8 C)  TempSrc: Oral  SpO2: 97%  Weight: (!) 366 lb 6.4 oz (166.2 kg)   Body mass index is 57.39 kg/m.  ROS- no fever, chills, sweats, blood in stool, syncope, rashes, chest pain, SOB. Positive for yellow post nasal drainage, mild cough.   Wyvonne Carda RODRIGUEZ-SOUTHWORTH, PA-C

## 2018-03-27 NOTE — Progress Notes (Signed)
Subjective:     Patient ID: [name] Wright , female    DOB: 07/11/1965 , 52 y.o.   MRN: [id number]   Chief Complaint  Patient presents with  . Diarrhea    thought she had food poisoning. vomitted all day friday diarrhea started saturday and its like water stomach cramping has been taking immodiun hasnt gone today   . Sinus Problem    came and was only given 7 pills for a 10 day rx. color of mucus is yellow/brown and chest feels tight    HPI 2-N/V/D since Friday. Diarrhea still going. Been taking Imodium. No BM today. 2- Her sinuses is not all the way better. There is still pain and pressure, but not as bad.    Past Medical History:  Diagnosis Date  . Acute cystitis   . Allergic rhinitis   . Childhood asthma    no problems as adult - no inhaler  . Chronic insomnia   . Chronic pain due to trauma 02/2001   closed head trauma - Followed by Dr [facility]  . Complication of anesthesia   . Dyspepsia   . Head trauma 02/24/2001   closed  . History of kidney stones    passed stone - no surgery required  . Hypothyroidism   . PONV (postoperative nausea and vomiting)   . Pre-diabetes   . SVD (spontaneous vaginal delivery)    fetal demise at 5 months     Family History  Problem Relation Age of Onset  . Cancer Maternal Grandmother        ovarion     Current Outpatient Medications:  .  albuterol (PROAIR HFA) 108 (90 Base) MCG/ACT inhaler, Inhale 2 puffs into the lungs every 4 (four) hours as needed for wheezing or shortness of breath., Disp: 1 Inhaler, Rfl: 1 .  baclofen (LIORESAL) 10 MG tablet, Take 1/2-1 tab every 8 hours prn, Disp: , Rfl:  .  Carbinoxamine Maleate (RYVENT) 6 MG TABS, Take 6 mg by mouth 2 (two) times daily., Disp: 180 tablet, Rfl: 0 .  cetirizine (ZYRTEC) 10 MG tablet, Take 10 mg by mouth daily., Disp: , Rfl:  .  diazepam (VALIUM) 10 MG tablet, Take 10 mg by mouth every 6 (six) hours as needed for anxiety. , Disp: , Rfl:  .   diphenhydrAMINE (BENADRYL ALLERGY) 25 mg capsule, Take 25 mg by mouth every 6 (six) hours as needed., Disp: , Rfl:  .  esomeprazole (NEXIUM) 40 MG capsule, Take by mouth., Disp: , Rfl:  .  furosemide (LASIX) 80 MG tablet, Take 80 mg by mouth daily as needed for fluid., Disp: , Rfl:  .  ibuprofen (ADVIL,MOTRIN) 200 MG tablet, Take 200-400 mg by mouth every 6 (six) hours as needed for headache or moderate pain. , Disp: , Rfl:  .  levothyroxine (SYNTHROID, LEVOTHROID) 88 MCG tablet, Take 88 mcg by mouth daily before breakfast., Disp: , Rfl:  .  morphine (MSIR) 15 MG tablet, Take 15 mg by mouth 4 (four) times daily as needed for severe pain. , Disp: , Rfl:  .  PROAIR RESPICLICK 536 (90 Base) MCG/ACT AEPB, INHALE 2 PUFFS BY MOUTH EVERY 4 HOURS INTO THE LUNGS AS NEEDED FOR WHEEZING OR SHORTNESS OF BREATH, Disp: , Rfl: 0 .  promethazine (PHENERGAN) 25 MG tablet, Take 1 tablet (25 mg total) by mouth every 6 (six) hours as needed for nausea or vomiting., Disp: 12 tablet, Rfl: 0 .  promethazine (PHENERGAN) 25 MG  tablet, TAKE 1 TO 2 TABLETS BY MOUTH EVERY 6 HOURS AS NEEDED, Disp: , Rfl:  .  pseudoephedrine (SUDAFED) 30 MG tablet, Take 1 tablet (30 mg total) by mouth 2 (two) times daily as needed (for congestion)., Disp: 90 tablet, Rfl: 0 .  traMADol (ULTRAM) 50 MG tablet, Take 1 tablet (50 mg total) by mouth every 6 (six) hours as needed., Disp: 120 tablet, Rfl: 0   Allergies  Allergen Reactions  . Oxycodone-Acetaminophen Hives and Nausea And Vomiting  . Prochlorperazine Edisylate Other (See Comments)    Hyper and jitteriness  . Amoxicillin Rash and Other (See Comments)    Has patient had a PCN reaction causing immediate rash, facial/tongue/throat swelling, SOB or lightheadedness with hypotension: Unknown Has patient had a PCN reaction causing severe rash involving mucus membranes or skin necrosis: Unknown Has patient had a PCN reaction that required hospitalization: No Has patient had a PCN reaction  occurring within the last 10 years: Yes If all of the above answers are "NO", then may proceed with Cephalosporin use.   . Compazine Other (See Comments)    Hyper/jitters/blotches  . Doxycycline Diarrhea and Other (See Comments)    Vomiting, headache  . Eszopiclone Other (See Comments)    Metallic taste and became un effective   . Keppra [Levetiracetam] Other (See Comments)    hyperactive  . Levetiracetam Other (See Comments)    Jitters/hyper  . Neurontin [Gabapentin] Nausea And Vomiting  . Propoxyphene Hives  . Trileptal [Oxcarbazepine] Other (See Comments)    REACTION: immune system suppressed  . Bactrim [Sulfamethoxazole-Trimethoprim] Rash  . Bupropion Rash  . Codeine Rash  . Hydrocodone-Acetaminophen Rash  . Moxifloxacin Swelling and Rash  . Propoxyphene N-Acetaminophen Nausea And Vomiting and Rash  . Vicodin [Hydrocodone-Acetaminophen] Rash     Review of Systems - no fever, chills, sweats, rashes, CP, SOB, no blood in stool. + for mild cough, post nasal drainage with yellow color. Rest is neg.   Today's Vitals   03/12/18 1019  BP: 132/78  Pulse: 80  Temp: 98.2 F (36.8 C)  TempSrc: Oral  SpO2: 97%  Weight: (!) 366 lb 6.4 oz (166.2 kg)   Body mass index is 57.39 kg/m.   Objective:  Physical Exam   Constitutional: She is oriented to person, place, and time. She appears well-developed and well-nourished. No distress.  HENT: TM's are clear, maxillary sinuses still tender.Pharynx- clear Head: Normocephalic and atraumatic.  Right Ear: External ear normal.  Left Ear: External ear normal.  Nose: Nose normal.  Eyes: Conjunctivae are normal. Right eye exhibits no discharge. Left eye exhibits no discharge. No scleral icterus.  Neck: Neck supple. No thyromegaly present. No carotid bruits bilaterally  Cardiovascular: Normal rate and regular rhythm. No murmur heard. Pulmonary/Chest: Effort normal and breath sounds normal. No respiratory distress.  Musculoskeletal: Normal  range of motion. She exhibits no edema.  Lymphadenopathy: She has no cervical adenopathy.  Abdomen- + BS soft, mild tenderness on lower abdomen, with no rebound, guarding or masses.  Neurological: She is alert and oriented to person, place, and time.  Skin: Skin is warm and dry. Capillary refill takes less than 2 seconds. No rash noted. She is not diaphoretic.  Psychiatric: She has a normal mood and affect. Her behavior is normal. Judgment and thought content normal.  Nursing note reviewed.     Assessment And Plan:    1. Acute recurrent maxillary sinusitis- unresolved. I placed her on Levaquin.  2. Diarrhea, unspecified type- needs to d/c Imodium  and switch to Pepto instead. Needs to stay on BRAT diet for 48h and advance to regular diet slowly.  If she gets fever, bloody stool, needs to be seen.     Abrey Bradway RODRIGUEZ-SOUTHWORTH, PA-C

## 2018-04-10 ENCOUNTER — Ambulatory Visit: Payer: Medicare Other | Admitting: Internal Medicine

## 2018-04-11 ENCOUNTER — Encounter: Payer: Self-pay | Admitting: Nurse Practitioner

## 2018-04-11 ENCOUNTER — Ambulatory Visit (INDEPENDENT_AMBULATORY_CARE_PROVIDER_SITE_OTHER): Payer: Medicare Other | Admitting: Nurse Practitioner

## 2018-04-11 VITALS — BP 132/86 | HR 64 | Temp 97.8°F | Ht 64.6 in | Wt 368.8 lb

## 2018-04-11 DIAGNOSIS — J0101 Acute recurrent maxillary sinusitis: Secondary | ICD-10-CM | POA: Diagnosis not present

## 2018-04-11 MED ORDER — CEFTRIAXONE SODIUM 1 G IJ SOLR
1.0000 g | Freq: Once | INTRAMUSCULAR | Status: AC
  Administered 2018-04-11: 1 g via INTRAMUSCULAR

## 2018-04-11 NOTE — Patient Instructions (Signed)

## 2018-04-11 NOTE — Progress Notes (Signed)
Subjective:     Patient ID: [name] Wright , female    DOB: 1965/11/23 , 53 y.o.   MRN: [id number]   Chief Complaint  Patient presents with  . Sinusitis    HPI  Sinusitis  This is a chronic problem. The current episode started 1 to 4 weeks ago. There has been no fever. Pertinent negatives include no chills, headaches or sinus pressure.     Past Medical History:  Diagnosis Date  . Acute cystitis   . Allergic rhinitis   . Childhood asthma    no problems as adult - no inhaler  . Chronic insomnia   . Chronic pain due to trauma 02/2001   closed head trauma - Followed by Dr [facility]  . Complication of anesthesia   . Dyspepsia   . Head trauma 02/24/2001   closed  . History of kidney stones    passed stone - no surgery required  . Hypothyroidism   . PONV (postoperative nausea and vomiting)   . Pre-diabetes   . SVD (spontaneous vaginal delivery)    fetal demise at 5 months     Family History  Problem Relation Age of Onset  . Cancer Maternal Grandmother        ovarion     Current Outpatient Medications:  .  albuterol (PROAIR HFA) 108 (90 Base) MCG/ACT inhaler, Inhale 2 puffs into the lungs every 4 (four) hours as needed for wheezing or shortness of breath., Disp: 1 Inhaler, Rfl: 1 .  baclofen (LIORESAL) 10 MG tablet, Take 1/2-1 tab every 8 hours prn, Disp: , Rfl:  .  Carbinoxamine Maleate (RYVENT) 6 MG TABS, Take 6 mg by mouth 2 (two) times daily., Disp: 180 tablet, Rfl: 0 .  cetirizine (ZYRTEC) 10 MG tablet, Take 10 mg by mouth daily., Disp: , Rfl:  .  diazepam (VALIUM) 10 MG tablet, Take 10 mg by mouth every 6 (six) hours as needed for anxiety. , Disp: , Rfl:  .  diphenhydrAMINE (BENADRYL ALLERGY) 25 mg capsule, Take 25 mg by mouth every 6 (six) hours as needed., Disp: , Rfl:  .  esomeprazole (NEXIUM) 40 MG capsule, Take by mouth., Disp: , Rfl:  .  furosemide (LASIX) 80 MG tablet, Take 80 mg by mouth daily as needed for fluid., Disp:  , Rfl:  .  ibuprofen (ADVIL,MOTRIN) 200 MG tablet, Take 200-400 mg by mouth every 6 (six) hours as needed for headache or moderate pain. , Disp: , Rfl:  .  levothyroxine (SYNTHROID, LEVOTHROID) 88 MCG tablet, Take 88 mcg by mouth daily before breakfast., Disp: , Rfl:  .  morphine (MSIR) 15 MG tablet, Take 15 mg by mouth 4 (four) times daily as needed for severe pain. , Disp: , Rfl:  .  PROAIR RESPICLICK 409 (90 Base) MCG/ACT AEPB, INHALE 2 PUFFS BY MOUTH EVERY 4 HOURS INTO THE LUNGS AS NEEDED FOR WHEEZING OR SHORTNESS OF BREATH, Disp: , Rfl: 0 .  promethazine (PHENERGAN) 25 MG tablet, Take 1 tablet (25 mg total) by mouth every 6 (six) hours as needed for nausea or vomiting., Disp: 12 tablet, Rfl: 0 .  promethazine (PHENERGAN) 25 MG tablet, TAKE 1 TO 2 TABLETS BY MOUTH EVERY 6 HOURS AS NEEDED, Disp: , Rfl:  .  pseudoephedrine (SUDAFED) 30 MG tablet, Take 1 tablet (30 mg total) by mouth 2 (two) times daily as needed (for congestion)., Disp: 90 tablet, Rfl: 0 .  traMADol (ULTRAM) 50 MG tablet, Take 1 tablet (50  mg total) by mouth every 6 (six) hours as needed., Disp: 120 tablet, Rfl: 0   Allergies  Allergen Reactions  . Oxycodone-Acetaminophen Hives and Nausea And Vomiting  . Prochlorperazine Edisylate Other (See Comments)    Hyper and jitteriness  . Amoxicillin Rash and Other (See Comments)    Has patient had a PCN reaction causing immediate rash, facial/tongue/throat swelling, SOB or lightheadedness with hypotension: Unknown Has patient had a PCN reaction causing severe rash involving mucus membranes or skin necrosis: Unknown Has patient had a PCN reaction that required hospitalization: No Has patient had a PCN reaction occurring within the last 10 years: Yes If all of the above answers are "NO", then may proceed with Cephalosporin use.   . Compazine Other (See Comments)    Hyper/jitters/blotches  . Doxycycline Diarrhea and Other (See Comments)    Vomiting, headache  . Eszopiclone Other (See  Comments)    Metallic taste and became un effective   . Keppra [Levetiracetam] Other (See Comments)    hyperactive  . Levetiracetam Other (See Comments)    Jitters/hyper  . Neurontin [Gabapentin] Nausea And Vomiting  . Propoxyphene Hives  . Trileptal [Oxcarbazepine] Other (See Comments)    REACTION: immune system suppressed  . Bactrim [Sulfamethoxazole-Trimethoprim] Rash  . Bupropion Rash  . Codeine Rash  . Hydrocodone-Acetaminophen Rash  . Moxifloxacin Swelling and Rash  . Propoxyphene N-Acetaminophen Nausea And Vomiting and Rash  . Vicodin [Hydrocodone-Acetaminophen] Rash     Review of Systems  Constitutional: Negative for chills.  HENT: Negative for sinus pressure.   Neurological: Negative for headaches.     Today's Vitals   04/11/18 1552  BP: 132/86  Pulse: 64  Temp: 97.8 F (36.6 C)  TempSrc: Oral  SpO2: 91%  Weight: (!) 368 lb 12.8 oz (167.3 kg)  Height: 5' 4.6" (1.641 m)  PainSc: 2   PainLoc: Throat   Body mass index is 62.13 kg/m.   Objective:  Physical Exam Constitutional:      Appearance: Normal appearance.  HENT:     Head: Normocephalic and atraumatic.     Right Ear: Tympanic membrane is bulging.     Left Ear: Tympanic membrane is bulging.     Nose:     Right Turbinates: Enlarged.     Left Turbinates: Enlarged.     Right Sinus: Maxillary sinus tenderness present.     Left Sinus: Maxillary sinus tenderness present.  Cardiovascular:     Rate and Rhythm: Normal rate and regular rhythm.     Pulses: Normal pulses.     Heart sounds: Normal heart sounds. No murmur.  Pulmonary:     Effort: Pulmonary effort is normal. No respiratory distress.     Breath sounds: Normal breath sounds. No wheezing, rhonchi or rales.  Neurological:     Mental Status: She is alert.         Assessment And Plan:     1. Acute recurrent maxillary sinusitis  This has been recurrent will treat with rocephin in hopes this will relieve her symptoms and clear the  sinusitis.  Continue with nasal rinses as tolerated and antihistamines - cefTRIAXone (ROCEPHIN) injection 1 g        [name], FNP

## 2018-04-22 DIAGNOSIS — R202 Paresthesia of skin: Secondary | ICD-10-CM | POA: Diagnosis not present

## 2018-04-22 DIAGNOSIS — R51 Headache: Secondary | ICD-10-CM | POA: Diagnosis not present

## 2018-04-22 DIAGNOSIS — R5383 Other fatigue: Secondary | ICD-10-CM | POA: Diagnosis not present

## 2018-04-22 DIAGNOSIS — G43909 Migraine, unspecified, not intractable, without status migrainosus: Secondary | ICD-10-CM | POA: Diagnosis not present

## 2018-04-22 DIAGNOSIS — G4733 Obstructive sleep apnea (adult) (pediatric): Secondary | ICD-10-CM | POA: Diagnosis not present

## 2018-04-22 DIAGNOSIS — G894 Chronic pain syndrome: Secondary | ICD-10-CM | POA: Diagnosis not present

## 2018-04-22 DIAGNOSIS — S0990XS Unspecified injury of head, sequela: Secondary | ICD-10-CM | POA: Diagnosis not present

## 2018-04-23 ENCOUNTER — Other Ambulatory Visit: Payer: Self-pay | Admitting: Internal Medicine

## 2018-04-29 ENCOUNTER — Encounter: Payer: Self-pay | Admitting: Nurse Practitioner

## 2018-05-06 ENCOUNTER — Encounter: Payer: Self-pay | Admitting: Internal Medicine

## 2018-05-06 ENCOUNTER — Ambulatory Visit (INDEPENDENT_AMBULATORY_CARE_PROVIDER_SITE_OTHER): Payer: Medicare Other | Admitting: Internal Medicine

## 2018-05-06 VITALS — BP 150/86 | HR 86 | Temp 98.3°F | Ht 64.6 in | Wt 367.6 lb

## 2018-05-06 DIAGNOSIS — E559 Vitamin D deficiency, unspecified: Secondary | ICD-10-CM

## 2018-05-06 DIAGNOSIS — J069 Acute upper respiratory infection, unspecified: Secondary | ICD-10-CM | POA: Diagnosis not present

## 2018-05-06 DIAGNOSIS — Z862 Personal history of diseases of the blood and blood-forming organs and certain disorders involving the immune mechanism: Secondary | ICD-10-CM | POA: Diagnosis not present

## 2018-05-06 DIAGNOSIS — J3489 Other specified disorders of nose and nasal sinuses: Secondary | ICD-10-CM

## 2018-05-06 NOTE — Patient Instructions (Addendum)
Try to do saline nose rinses with the new kit that has a suction  Avoid dairy and wheat since they are highly inflammatory and will worsen your sinus symptoms.   It is best to try to stay off antibiotics since they kill your normal bacteria which helps with immune system. It takes 2 years to replace your normal gut bacteria for each time you take an antibiotic.

## 2018-05-06 NOTE — Progress Notes (Signed)
Subjective:     Patient ID: [name] Wright , female    DOB: 1966-02-10 , 53 y.o.   MRN: [id number]   Chief Complaint  Patient presents with  . URI    pt denies coughing, she has nasal congestion    HPI 1-Pt is here due to recurrent nose congestion x 3 days after getting her hair done and the smell provoked increased congestion. Also used difeint hair product as well.  Has been having purulent nose congestion. Has been taking vit C. Her husband has viral URI.   2- needs to have anemia check due to heavy bleeding and she saw her GYN and was told to have this done here. She has not been taking her Fe.    Past Medical History:  Diagnosis Date  . Acute cystitis   . Allergic rhinitis   . Childhood asthma    no problems as adult - no inhaler  . Chronic insomnia   . Chronic pain due to trauma 02/2001   closed head trauma - Followed by Dr [facility]  . Complication of anesthesia   . Dyspepsia   . Head trauma 02/24/2001   closed  . History of kidney stones    passed stone - no surgery required  . Hypothyroidism   . PONV (postoperative nausea and vomiting)   . Pre-diabetes   . SVD (spontaneous vaginal delivery)    fetal demise at 5 months     Family History  Problem Relation Age of Onset  . Cancer Maternal Grandmother        ovarion     Current Outpatient Medications:  .  albuterol (PROAIR HFA) 108 (90 Base) MCG/ACT inhaler, Inhale 2 puffs into the lungs every 4 (four) hours as needed for wheezing or shortness of breath., Disp: 1 Inhaler, Rfl: 1 .  baclofen (LIORESAL) 10 MG tablet, Take 1/2-1 tab every 8 hours prn, Disp: , Rfl:  .  Carbinoxamine Maleate (RYVENT) 6 MG TABS, Take 6 mg by mouth 2 (two) times daily., Disp: 180 tablet, Rfl: 0 .  cetirizine (ZYRTEC) 10 MG tablet, Take 10 mg by mouth daily., Disp: , Rfl:  .  diazepam (VALIUM) 10 MG tablet, Take 10 mg by mouth every 6 (six) hours as needed for anxiety. , Disp: , Rfl:  .   diphenhydrAMINE (BENADRYL ALLERGY) 25 mg capsule, Take 25 mg by mouth every 6 (six) hours as needed., Disp: , Rfl:  .  esomeprazole (NEXIUM) 40 MG capsule, Take by mouth., Disp: , Rfl:  .  furosemide (LASIX) 80 MG tablet, Take 80 mg by mouth daily as needed for fluid., Disp: , Rfl:  .  ibuprofen (ADVIL,MOTRIN) 200 MG tablet, Take 200-400 mg by mouth every 6 (six) hours as needed for headache or moderate pain. , Disp: , Rfl:  .  levothyroxine (SYNTHROID, LEVOTHROID) 88 MCG tablet, Take 88 mcg by mouth daily before breakfast., Disp: , Rfl:  .  morphine (MSIR) 15 MG tablet, Take 15 mg by mouth 4 (four) times daily as needed for severe pain. , Disp: , Rfl:  .  PROAIR RESPICLICK 024 (90 Base) MCG/ACT AEPB, INHALE 2 PUFFS BY MOUTH EVERY 4 HOURS INTO THE LUNGS AS NEEDED FOR WHEEZING OR SHORTNESS OF BREATH, Disp: , Rfl: 0 .  promethazine (PHENERGAN) 25 MG tablet, Take 1 tablet (25 mg total) by mouth every 6 (six) hours as needed for nausea or vomiting., Disp: 12 tablet, Rfl: 0 .  promethazine (PHENERGAN) 25 MG  tablet, TAKE 1 TO 2 TABLETS BY MOUTH EVERY 6 HOURS AS NEEDED, Disp: , Rfl:  .  pseudoephedrine (SUDAFED) 30 MG tablet, TAKE 1 TABLET BY MOUTH TWICE DAILY AS NEEDED FOR CONGESTION, Disp: 90 tablet, Rfl: 0 .  traMADol (ULTRAM) 50 MG tablet, Take 1 tablet (50 mg total) by mouth every 6 (six) hours as needed., Disp: 120 tablet, Rfl: 0   Allergies  Allergen Reactions  . Oxycodone-Acetaminophen Hives and Nausea And Vomiting  . Prochlorperazine Edisylate Other (See Comments)    Hyper and jitteriness  . Amoxicillin Rash and Other (See Comments)    Has patient had a PCN reaction causing immediate rash, facial/tongue/throat swelling, SOB or lightheadedness with hypotension: Unknown Has patient had a PCN reaction causing severe rash involving mucus membranes or skin necrosis: Unknown Has patient had a PCN reaction that required hospitalization: No Has patient had a PCN reaction occurring within the last 10  years: Yes If all of the above answers are "NO", then may proceed with Cephalosporin use.   . Compazine Other (See Comments)    Hyper/jitters/blotches  . Doxycycline Diarrhea and Other (See Comments)    Vomiting, headache  . Eszopiclone Other (See Comments)    Metallic taste and became un effective   . Keppra [Levetiracetam] Other (See Comments)    hyperactive  . Levetiracetam Other (See Comments)    Jitters/hyper  . Neurontin [Gabapentin] Nausea And Vomiting  . Propoxyphene Hives  . Trileptal [Oxcarbazepine] Other (See Comments)    REACTION: immune system suppressed  . Bactrim [Sulfamethoxazole-Trimethoprim] Rash  . Bupropion Rash  . Codeine Rash  . Hydrocodone-Acetaminophen Rash  . Moxifloxacin Swelling and Rash  . Propoxyphene N-Acetaminophen Nausea And Vomiting and Rash  . Vicodin [Hydrocodone-Acetaminophen] Rash     Review of Systems  Constitutional: Negative for chills, diaphoresis and fever.  HENT: Positive for congestion, postnasal drip and sneezing. Negative for sinus pressure and sinus pain.   Eyes: Negative for discharge.  Respiratory: Negative for cough, chest tightness, shortness of breath and wheezing.   Cardiovascular: Negative for chest pain.  Musculoskeletal: Negative for myalgias.  Allergic/Immunologic: Positive for environmental allergies and food allergies.  Neurological: Negative for headaches.     Today's Vitals   05/06/18 1020  BP: (!) 150/86  Pulse: 86  Temp: 98.3 F (36.8 C)  TempSrc: Oral  SpO2: 93%  Weight: (!) 367 lb 9.6 oz (166.7 kg)  Height: 5' 4.6" (1.641 m)  PainSc: 0-No pain   Body mass index is 61.93 kg/m.   Objective:  Physical Exam Vitals signs and nursing note reviewed.  HENT:     Nose: Congestion and rhinorrhea present.     Comments: Mucosa is pale pink. Has tenderness on all her sinuses Pulmonary:     Effort: Pulmonary effort is normal.     Breath sounds: Normal breath sounds.  Neurological:     Mental Status: She  is alert.     Assessment And Plan:   1- History of anemia- acute - CBC with Diff  2. Vitamin D deficiency- past hx of this.  - Vitamin D 1,25 Dihydroxy  3. Sinus pressure- seems allergic related. She will try the saline nose rinses with the suction kit, and avoid dairy and wheat since this are inflammatory foods.     Naphtali Riede RODRIGUEZ-SOUTHWORTH, PA-C

## 2018-05-07 ENCOUNTER — Other Ambulatory Visit: Payer: Self-pay | Admitting: Internal Medicine

## 2018-05-07 LAB — CBC WITH DIFFERENTIAL/PLATELET
BASOS: 1 %
Basophils Absolute: 0.1 10*3/uL (ref 0.0–0.2)
EOS (ABSOLUTE): 0.3 10*3/uL (ref 0.0–0.4)
Eos: 4 %
Hematocrit: 40.4 % (ref 34.0–46.6)
Hemoglobin: 13.2 g/dL (ref 11.1–15.9)
Immature Grans (Abs): 0 10*3/uL (ref 0.0–0.1)
Immature Granulocytes: 0 %
Lymphocytes Absolute: 2.2 10*3/uL (ref 0.7–3.1)
Lymphs: 30 %
MCH: 27.7 pg (ref 26.6–33.0)
MCHC: 32.7 g/dL (ref 31.5–35.7)
MCV: 85 fL (ref 79–97)
MONOS ABS: 0.5 10*3/uL (ref 0.1–0.9)
Monocytes: 7 %
NEUTROS ABS: 4.2 10*3/uL (ref 1.4–7.0)
NEUTROS PCT: 58 %
PLATELETS: 347 10*3/uL (ref 150–450)
RBC: 4.77 x10E6/uL (ref 3.77–5.28)
RDW: 13 % (ref 11.7–15.4)
WBC: 7.3 10*3/uL (ref 3.4–10.8)

## 2018-05-07 LAB — VITAMIN D 25 HYDROXY (VIT D DEFICIENCY, FRACTURES): Vit D, 25-Hydroxy: 30.3 ng/mL (ref 30.0–100.0)

## 2018-05-07 MED ORDER — VITAMIN D (ERGOCALCIFEROL) 1.25 MG (50000 UNIT) PO CAPS: 50000.0000 [IU] | ORAL_CAPSULE | ORAL | 0 refills | Status: DC

## 2018-05-07 NOTE — Progress Notes (Signed)
Vit D rx sent.

## 2018-06-02 DIAGNOSIS — M25521 Pain in right elbow: Secondary | ICD-10-CM | POA: Diagnosis not present

## 2018-06-06 ENCOUNTER — Other Ambulatory Visit: Payer: Self-pay | Admitting: Internal Medicine

## 2018-07-02 ENCOUNTER — Ambulatory Visit: Payer: Self-pay | Admitting: Internal Medicine

## 2018-07-24 ENCOUNTER — Telehealth: Payer: Self-pay

## 2018-07-24 NOTE — Telephone Encounter (Signed)
Okay 

## 2018-07-24 NOTE — Telephone Encounter (Signed)
I called and asked pt if she has done the kit for her cologuard and she stated she has not because it says that she should not be on her menstrual when she provides the sample. Patient stated she has been on it for the past 30 days so she has been unable to complete but she is going to see her obgyn about it. I advised pt to try to complete it before it expires in October. YRL,RMA

## 2018-07-29 ENCOUNTER — Other Ambulatory Visit: Payer: Self-pay | Admitting: Internal Medicine

## 2018-08-06 ENCOUNTER — Telehealth: Payer: Self-pay | Admitting: Internal Medicine

## 2018-08-06 NOTE — Telephone Encounter (Signed)
I called the patient to schedule virtual AWV w/ Pamala Hurry (Oct 2019 AWV cancelled).  She asked me to call her back on Monday. VDM (DD)

## 2018-08-12 ENCOUNTER — Other Ambulatory Visit: Payer: Self-pay

## 2018-08-12 ENCOUNTER — Ambulatory Visit (INDEPENDENT_AMBULATORY_CARE_PROVIDER_SITE_OTHER): Payer: Medicare Other

## 2018-08-12 VITALS — Ht 68.0 in | Wt 368.0 lb

## 2018-08-12 DIAGNOSIS — Z23 Encounter for immunization: Secondary | ICD-10-CM | POA: Diagnosis not present

## 2018-08-12 DIAGNOSIS — Z0000 Encounter for general adult medical examination without abnormal findings: Secondary | ICD-10-CM

## 2018-08-12 MED ORDER — TETANUS-DIPHTH-ACELL PERTUSSIS 5-2-15.5 LF-MCG/0.5 IM SUSP: 0.5000 mL | Freq: Once | INTRAMUSCULAR | 0 refills | Status: AC

## 2018-08-12 NOTE — Patient Instructions (Signed)
[name] Wright , Thank you for taking time to come for your Medicare Wellness Visit. I appreciate your ongoing commitment to your health goals. Please review the following plan we discussed and let me know if I can assist you in the future.   Screening recommendations/referrals: Colonoscopy: does cologuard Mammogram: will schedule after quarantine Bone Density: n/a Recommended yearly ophthalmology/optometry visit for glaucoma screening and checkup Recommended yearly dental visit for hygiene and checkup  Vaccinations: Influenza vaccine: declines Pneumococcal vaccine: n/a Tdap vaccine: due Shingles vaccine: discussed    Advanced directives: Advance directive discussed with you today.   Conditions/risks identified: Obesity  Next appointment: 01/21/2019 at 10:30  Preventive Care 40-64 Years, Female Preventive care refers to lifestyle choices and visits with your health care provider that can promote health and wellness. What does preventive care include?  A yearly physical exam. This is also called an annual well check.  Dental exams once or twice a year.  Routine eye exams. Ask your health care provider how often you should have your eyes checked.  Personal lifestyle choices, including:  Daily care of your teeth and gums.  Regular physical activity.  Eating a healthy diet.  Avoiding tobacco and drug use.  Limiting alcohol use.  Practicing safe sex.  Taking low-dose aspirin daily starting at age 69.  Taking vitamin and mineral supplements as recommended by your health care provider. What happens during an annual well check? The services and screenings done by your health care provider during your annual well check will depend on your age, overall health, lifestyle risk factors, and family history of disease. Counseling  Your health care provider may ask you questions about your:  Alcohol use.  Tobacco use.  Drug use.  Emotional well-being.  Home and  relationship well-being.  Sexual activity.  Eating habits.  Work and work Statistician.  Method of birth control.  Menstrual cycle.  Pregnancy history. Screening  You may have the following tests or measurements:  Height, weight, and BMI.  Blood pressure.  Lipid and cholesterol levels. These may be checked every 5 years, or more frequently if you are over 2 years old.  Skin check.  Lung cancer screening. You may have this screening every year starting at age 68 if you have a 30-pack-year history of smoking and currently smoke or have quit within the past 15 years.  Fecal occult blood test (FOBT) of the stool. You may have this test every year starting at age 63.  Flexible sigmoidoscopy or colonoscopy. You may have a sigmoidoscopy every 5 years or a colonoscopy every 10 years starting at age 87.  Hepatitis C blood test.  Hepatitis B blood test.  Sexually transmitted disease (STD) testing.  Diabetes screening. This is done by checking your blood sugar (glucose) after you have not eaten for a while (fasting). You may have this done every 1-3 years.  Mammogram. This may be done every 1-2 years. Talk to your health care provider about when you should start having regular mammograms. This may depend on whether you have a family history of breast cancer.  BRCA-related cancer screening. This may be done if you have a family history of breast, ovarian, tubal, or peritoneal cancers.  Pelvic exam and Pap test. This may be done every 3 years starting at age 6. Starting at age 62, this may be done every 5 years if you have a Pap test in combination with an HPV test.  Bone density scan. This is done to screen for osteoporosis. You  may have this scan if you are at high risk for osteoporosis. Discuss your test results, treatment options, and if necessary, the need for more tests with your health care provider. Vaccines  Your health care provider may recommend certain vaccines, such  as:  Influenza vaccine. This is recommended every year.  Tetanus, diphtheria, and acellular pertussis (Tdap, Td) vaccine. You may need a Td booster every 10 years.  Zoster vaccine. You may need this after age 14.  Pneumococcal 13-valent conjugate (PCV13) vaccine. You may need this if you have certain conditions and were not previously vaccinated.  Pneumococcal polysaccharide (PPSV23) vaccine. You may need one or two doses if you smoke cigarettes or if you have certain conditions. Talk to your health care provider about which screenings and vaccines you need and how often you need them. This information is not intended to replace advice given to you by your health care provider. Make sure you discuss any questions you have with your health care provider. Document Released: 04/22/2015 Document Revised: 12/14/2015 Document Reviewed: 01/25/2015 Elsevier Interactive Patient Education  2017 [place] Prevention in the Home Falls can cause injuries. They can happen to people of all ages. There are many things you can do to make your home safe and to help prevent falls. What can I do on the outside of my home?  Regularly fix the edges of walkways and driveways and fix any cracks.  Remove anything that might make you trip as you walk through a door, such as a raised step or threshold.  Trim any bushes or trees on the path to your home.  Use bright outdoor lighting.  Clear any walking paths of anything that might make someone trip, such as rocks or tools.  Regularly check to see if handrails are loose or broken. Make sure that both sides of any steps have handrails.  Any raised decks and porches should have guardrails on the edges.  Have any leaves, snow, or ice cleared regularly.  Use sand or salt on walking paths during winter.  Clean up any spills in your garage right away. This includes oil or grease spills. What can I do in the bathroom?  Use night lights.   Install grab bars by the toilet and in the tub and shower. Do not use towel bars as grab bars.  Use non-skid mats or decals in the tub or shower.  If you need to sit down in the shower, use a plastic, non-slip stool.  Keep the floor dry. Clean up any water that spills on the floor as soon as it happens.  Remove soap buildup in the tub or shower regularly.  Attach bath mats securely with double-sided non-slip rug tape.  Do not have throw rugs and other things on the floor that can make you trip. What can I do in the bedroom?  Use night lights.  Make sure that you have a light by your bed that is easy to reach.  Do not use any sheets or blankets that are too big for your bed. They should not hang down onto the floor.  Have a firm chair that has side arms. You can use this for support while you get dressed.  Do not have throw rugs and other things on the floor that can make you trip. What can I do in the kitchen?  Clean up any spills right away.  Avoid walking on wet floors.  Keep items that you use a lot in  easy-to-reach places.  If you need to reach something above you, use a strong step stool that has a grab bar.  Keep electrical cords out of the way.  Do not use floor polish or wax that makes floors slippery. If you must use wax, use non-skid floor wax.  Do not have throw rugs and other things on the floor that can make you trip. What can I do with my stairs?  Do not leave any items on the stairs.  Make sure that there are handrails on both sides of the stairs and use them. Fix handrails that are broken or loose. Make sure that handrails are as long as the stairways.  Check any carpeting to make sure that it is firmly attached to the stairs. Fix any carpet that is loose or worn.  Avoid having throw rugs at the top or bottom of the stairs. If you do have throw rugs, attach them to the floor with carpet tape.  Make sure that you have a light switch at the top of the  stairs and the bottom of the stairs. If you do not have them, ask someone to add them for you. What else can I do to help prevent falls?  Wear shoes that:  Do not have high heels.  Have rubber bottoms.  Are comfortable and fit you well.  Are closed at the toe. Do not wear sandals.  If you use a stepladder:  Make sure that it is fully opened. Do not climb a closed stepladder.  Make sure that both sides of the stepladder are locked into place.  Ask someone to hold it for you, if possible.  Clearly mark and make sure that you can see:  Any grab bars or handrails.  First and last steps.  Where the edge of each step is.  Use tools that help you move around (mobility aids) if they are needed. These include:  Canes.  Walkers.  Scooters.  Crutches.  Turn on the lights when you go into a dark area. Replace any light bulbs as soon as they burn out.  Set up your furniture so you have a clear path. Avoid moving your furniture around.  If any of your floors are uneven, fix them.  If there are any pets around you, be aware of where they are.  Review your medicines with your doctor. Some medicines can make you feel dizzy. This can increase your chance of falling. Ask your doctor what other things that you can do to help prevent falls. This information is not intended to replace advice given to you by your health care provider. Make sure you discuss any questions you have with your health care provider. Document Released: 01/20/2009 Document Revised: 09/01/2015 Document Reviewed: 04/30/2014 Elsevier Interactive Patient Education  2017 [employer].

## 2018-08-12 NOTE — Progress Notes (Addendum)
Subjective:   [name] Wright is a 53 y.o. female who presents for Medicare Annual (Subsequent) preventive examination.  This visit type was conducted due to national recommendations for restrictions regarding the COVID-19 Pandemic (e.g. social distancing). This format is felt to be most appropriate for this patient at this time. All issues noted in this document were discussed and addressed. No physical exam was performed (except for noted visual exam findings with Video Visits). This patient, [name] Wright, has given permission to perform this visit via telephone. Vital signs may be absent or patient reported.  Patient location: at home  Nurse location:  [place] office     Review of Systems:  n/a Cardiac Risk Factors include: advanced age (>33men, >13 women);sedentary lifestyle;obesity (BMI >30kg/m2)     Objective:     Vitals: Ht 5\' 8"  (1.727 m) Comment: per patient report  Wt (!) 368 lb (166.9 kg) Comment: patient report  BMI 55.95 kg/m   Body mass index is 55.95 kg/m.  Advanced Directives 08/12/2018 04/10/2017 10/19/2014  Does Patient Have a Medical Advance Directive? No No No  Would patient like information on creating a medical advance directive? - Yes (MAU/Ambulatory/Procedural Areas - Information given) -    Tobacco Social History   Tobacco Use  Smoking Status Never Smoker  Smokeless Tobacco Never Used     Counseling given: Not Answered   Clinical Intake:  Pre-visit preparation completed: Yes  Pain : No/denies pain     Nutritional Status: BMI > 30  Obese Nutritional Risks: None Diabetes: No  How often do you need to have someone help you when you read instructions, pamphlets, or other written materials from your doctor or pharmacy?: 1 - Never What is the last grade level you completed in school?: first year graduate  Interpreter Needed?: No  Information entered by :: NAllen LPN  Past Medical History:  Diagnosis Date  . Acute cystitis    . Allergic rhinitis   . Childhood asthma    no problems as adult - no inhaler  . Chronic insomnia   . Chronic pain due to trauma 02/2001   closed head trauma - Followed by [facility]  . Complication of anesthesia   . Dyspepsia   . Head trauma 02/24/2001   closed  . History of kidney stones    passed stone - no surgery required  . Hypothyroidism   . PONV (postoperative nausea and vomiting)   . Pre-diabetes   . SVD (spontaneous vaginal delivery)    fetal demise at 5 months   Past Surgical History:  Procedure Laterality Date  . APPENDECTOMY    . DILATATION & CURETTAGE/HYSTEROSCOPY WITH MYOSURE N/A 04/15/2017   Procedure: DILATATION & CURETTAGE/HYSTEROSCOPY WITH MYOSURE POLYPECTOMY;  Surgeon: Everlene Farrier, MD;  Location: [facility];  Service: Gynecology;  Laterality: N/A;  . INCONTINENCE SURGERY    . PATELLA FRACTURE SURGERY     patient denies this surgery  . repair of toe laceration     dermabond to right big toe right foot  . repair of torn ligament right leg     patient denies this surgery  . right foot fracture     cast only  . TONSILLECTOMY    . WISDOM TOOTH EXTRACTION     Family History  Problem Relation Age of Onset  . Cancer Maternal Grandmother        ovarion   Social History   Socioeconomic History  . Marital status: Married  Spouse name: Not on file  . Number of children: Not on file  . Years of education: Not on file  . Highest education level: Not on file  Occupational History  . Occupation: disability  Social Needs  . Financial resource strain: Not hard at all  . Food insecurity:    Worry: Never true    Inability: Never true  . Transportation needs:    Medical: No    Non-medical: No  Tobacco Use  . Smoking status: Never Smoker  . Smokeless tobacco: Never Used  Substance and Sexual Activity  . Alcohol use: No  . Drug use: Yes    Types: Morphine    Comment: Morphine 15 mg tab 4xdaily prn  . Sexual activity: Not  Currently    Birth control/protection: None  Lifestyle  . Physical activity:    Days per week: 0 days    Minutes per session: 0 min  . Stress: Not at all  Relationships  . Social connections:    Talks on phone: Not on file    Gets together: Not on file    Attends religious service: Not on file    Active member of club or organization: Not on file    Attends meetings of clubs or organizations: Not on file    Relationship status: Not on file  Other Topics Concern  . 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 albuterol (PROAIR HFA) 108 (90 Base) MCG/ACT inhaler Inhale 2 puffs into the lungs every 4 (four) hours as needed for wheezing or shortness of breath.  . baclofen (LIORESAL) 10 MG tablet Take 1/2-1 tab every 8 hours prn  . Carbinoxamine Maleate (RYVENT) 6 MG TABS Take 6 mg by mouth 2 (two) times daily.  . cetirizine (ZYRTEC) 10 MG tablet Take 10 mg by mouth daily.  . diazepam (VALIUM) 10 MG tablet Take 10 mg by mouth every 6 (six) hours as needed for anxiety.   . diphenhydrAMINE (BENADRYL ALLERGY) 25 mg capsule Take 25 mg by mouth every 6 (six) hours as needed.  . furosemide (LASIX) 80 MG tablet Take 80 mg by mouth daily as needed for fluid.  Marland Kitchen ibuprofen (ADVIL,MOTRIN) 200 MG tablet Take 200-400 mg by mouth every 6 (six) hours as needed for headache or moderate pain.   Marland Kitchen levothyroxine (SYNTHROID, LEVOTHROID) 88 MCG tablet Take 88 mcg by mouth daily before breakfast.  . medroxyPROGESTERone (PROVERA) 10 MG tablet medroxyprogesterone 10 mg tablet  TAKE 1 TABLET BY MOUTH ONCE DAILY FOR 10 DAYS  . methocarbamol (ROBAXIN) 750 MG tablet methocarbamol 750 mg tablet  . morphine (MSIR) 15 MG tablet Take 15 mg by mouth 4 (four) times daily as needed for  severe pain.   Marland Kitchen PROAIR RESPICLICK 284 (90 Base) MCG/ACT AEPB INHALE 2 PUFFS BY MOUTH EVERY 4 HOURS INTO THE LUNGS AS NEEDED FOR WHEEZING OR SHORTNESS OF BREATH  . progesterone (PROMETRIUM) 200 MG capsule TAKE 1 CAPSULE BY MOUTH ONCE DAILY IN THE EVENING FOR 10 DAYS  . promethazine (PHENERGAN) 25 MG tablet Take 1 tablet (25 mg total) by mouth every 6 (six) hours as needed for nausea or vomiting.  . pseudoephedrine (SUDAFED) 30 MG tablet TAKE 1 TABLET BY MOUTH TWICE DAILY AS NEEDED  FOR CONGESTION  . traMADol (ULTRAM) 50 MG tablet Take 1 tablet (50 mg total) by mouth every 6 (six) hours as needed.  . tranexamic acid (LYSTEDA) 650 MG TABS tablet tranexamic acid 650 mg tablet  TAKE 2 TABLETS BY MOUTH THREE TIMES DAILY  . Vitamin D, Ergocalciferol, (DRISDOL) 1.25 MG (50000 UT) CAPS capsule Take 1 capsule (50,000 Units total) by mouth every 7 (seven) days.  Marland Kitchen esomeprazole (NEXIUM) 40 MG capsule Take by mouth.  . promethazine (PHENERGAN) 25 MG tablet TAKE 1 TO 2 TABLETS BY MOUTH EVERY 6 HOURS AS NEEDED  . Tdap (ADACEL) 08-08-13.5 LF-MCG/0.5 injection Inject 0.5 mLs into the muscle once for 1 dose.   No facility-administered encounter medications on file as of 08/12/2018.     Activities of Daily Living In your present state of health, do you have any difficulty performing the following activities: 08/12/2018  Hearing? N  Vision? N  Difficulty concentrating or making decisions? N  Walking or climbing stairs? N  Dressing or bathing? N  Doing errands, shopping? N  Preparing Food and eating ? N  Using the Toilet? N  In the past six months, have you accidently leaked urine? Y  Do you have problems with loss of bowel control? N  Managing your Medications? N  Managing your Finances? N  Housekeeping or managing your Housekeeping? N  Some recent data might be hidden    Patient Care Team: [name], MD as PCP - General (Internal Medicine) Biagio Borg, MD (Internal Medicine)    Assessment:    This is a routine wellness examination for [place].  Exercise Activities and Dietary recommendations Current Exercise Habits: The patient does not participate in regular exercise at present  Goals    . Weight (lb) < 200 lb (90.7 kg) (pt-stated)       Fall Risk Fall Risk  08/12/2018 05/06/2018 03/12/2018 02/25/2018 01/14/2018  Falls in the past year? 0 0 0 0 No  Risk for fall due to : Medication side effect - - - -  Follow up Falls prevention discussed - - - -   Is the patient's home free of loose throw rugs in walkways, pet beds, electrical cords, etc?   yes      Grab bars in the bathroom? no      Handrails on the stairs?   yes      Adequate lighting?   yes  Timed Get Up and Go performed: n/a  Depression Screen PHQ 2/9 Scores 08/12/2018 05/06/2018 03/12/2018 02/25/2018  PHQ - 2 Score 0 0 0 0  PHQ- 9 Score 0 - - -     Cognitive Function     6CIT Screen 08/12/2018  What Year? 0 points  What month? 0 points  What time? 0 points  Count back from 20 0 points  Months in reverse 0 points  Repeat phrase 0 points  Total Score 0    Immunization History  Administered Date(s) Administered  . Td 09/26/2001    Qualifies for Shingles Vaccine? yes  Screening Tests Health Maintenance  Topic Date Due  . HIV Screening  01/05/1982  . TETANUS/TDAP  09/27/2011  . PAP SMEAR-Modifier  06/07/2013  . MAMMOGRAM  01/05/2017  . COLONOSCOPY  01/05/2017  . INFLUENZA VACCINE  11/08/2018    Cancer Screenings: Lung: Low Dose CT Chest recommended if Age 4-80 years, 30 pack-year currently smoking OR have quit w/in 15years. Patient does not qualify. Breast:  Up to date on Mammogram? Yes   Up  to date of Bone Density/Dexa? n/a Colorectal: does cologuard  Additional Screenings: : Hepatitis C Screening: n/a     Plan:    Wants to get under 200 pounds. Does not get flu vaccines. Patient will schedule mammogram after the quarantine.   I have personally reviewed and noted the following in the  patient's chart:   . Medical and social history . Use of alcohol, tobacco or illicit drugs  . Current medications and supplements . Functional ability and status . Nutritional status . Physical activity . Advanced directives . List of other physicians . Hospitalizations, surgeries, and ER visits in previous 12 months . Vitals . Screenings to include cognitive, depression, and falls . Referrals and appointments  In addition, I have reviewed and discussed with patient certain preventive protocols, quality metrics, and best practice recommendations. A written personalized care plan for preventive services as well as general preventive health recommendations were provided to patient.     [name], LPN  12/11/7167

## 2018-09-11 DIAGNOSIS — H26493 Other secondary cataract, bilateral: Secondary | ICD-10-CM | POA: Diagnosis not present

## 2018-09-11 DIAGNOSIS — H52223 Regular astigmatism, bilateral: Secondary | ICD-10-CM | POA: Diagnosis not present

## 2018-09-11 DIAGNOSIS — H527 Unspecified disorder of refraction: Secondary | ICD-10-CM | POA: Diagnosis not present

## 2018-09-11 DIAGNOSIS — H0100B Unspecified blepharitis left eye, upper and lower eyelids: Secondary | ICD-10-CM | POA: Diagnosis not present

## 2018-09-11 DIAGNOSIS — H10503 Unspecified blepharoconjunctivitis, bilateral: Secondary | ICD-10-CM | POA: Diagnosis not present

## 2018-09-11 DIAGNOSIS — H0100A Unspecified blepharitis right eye, upper and lower eyelids: Secondary | ICD-10-CM | POA: Diagnosis not present

## 2018-09-22 ENCOUNTER — Other Ambulatory Visit: Payer: Self-pay | Admitting: Internal Medicine

## 2018-10-01 DIAGNOSIS — G43009 Migraine without aura, not intractable, without status migrainosus: Secondary | ICD-10-CM | POA: Diagnosis not present

## 2018-10-01 DIAGNOSIS — R51 Headache: Secondary | ICD-10-CM | POA: Diagnosis not present

## 2018-10-01 DIAGNOSIS — R200 Anesthesia of skin: Secondary | ICD-10-CM | POA: Diagnosis not present

## 2018-10-01 DIAGNOSIS — M542 Cervicalgia: Secondary | ICD-10-CM | POA: Diagnosis not present

## 2018-10-01 DIAGNOSIS — Z79899 Other long term (current) drug therapy: Secondary | ICD-10-CM | POA: Diagnosis not present

## 2018-10-01 DIAGNOSIS — G894 Chronic pain syndrome: Secondary | ICD-10-CM | POA: Diagnosis not present

## 2018-10-01 DIAGNOSIS — Z9119 Patient's noncompliance with other medical treatment and regimen: Secondary | ICD-10-CM | POA: Diagnosis not present

## 2018-10-01 DIAGNOSIS — D649 Anemia, unspecified: Secondary | ICD-10-CM | POA: Diagnosis not present

## 2018-10-01 DIAGNOSIS — Z885 Allergy status to narcotic agent status: Secondary | ICD-10-CM | POA: Diagnosis not present

## 2018-10-01 DIAGNOSIS — G4733 Obstructive sleep apnea (adult) (pediatric): Secondary | ICD-10-CM | POA: Diagnosis not present

## 2018-10-01 DIAGNOSIS — Z87820 Personal history of traumatic brain injury: Secondary | ICD-10-CM | POA: Diagnosis not present

## 2018-10-06 ENCOUNTER — Ambulatory Visit: Payer: Medicare Other | Admitting: Nurse Practitioner

## 2018-10-08 ENCOUNTER — Ambulatory Visit (INDEPENDENT_AMBULATORY_CARE_PROVIDER_SITE_OTHER): Payer: Medicare Other | Admitting: Nurse Practitioner

## 2018-10-08 ENCOUNTER — Other Ambulatory Visit: Payer: Self-pay

## 2018-10-08 ENCOUNTER — Ambulatory Visit: Payer: Medicare Other | Admitting: Nurse Practitioner

## 2018-10-08 ENCOUNTER — Encounter: Payer: Self-pay | Admitting: Nurse Practitioner

## 2018-10-08 VITALS — BP 130/70 | HR 82 | Temp 98.3°F | Ht 64.2 in | Wt 372.6 lb

## 2018-10-08 DIAGNOSIS — N939 Abnormal uterine and vaginal bleeding, unspecified: Secondary | ICD-10-CM

## 2018-10-08 DIAGNOSIS — H9202 Otalgia, left ear: Secondary | ICD-10-CM | POA: Diagnosis not present

## 2018-10-08 DIAGNOSIS — E559 Vitamin D deficiency, unspecified: Secondary | ICD-10-CM | POA: Diagnosis not present

## 2018-10-08 DIAGNOSIS — E039 Hypothyroidism, unspecified: Secondary | ICD-10-CM

## 2018-10-08 DIAGNOSIS — R21 Rash and other nonspecific skin eruption: Secondary | ICD-10-CM | POA: Diagnosis not present

## 2018-10-08 MED ORDER — TRIAMCINOLONE ACETONIDE 0.1 % EX CREA: 1.0000 "application " | TOPICAL_CREAM | Freq: Two times a day (BID) | CUTANEOUS | 0 refills | Status: DC

## 2018-10-08 MED ORDER — CIPROFLOXACIN HCL 500 MG PO TABS: 500.0000 mg | ORAL_TABLET | Freq: Two times a day (BID) | ORAL | 0 refills | Status: AC

## 2018-10-08 MED ORDER — FLUCONAZOLE 150 MG PO TABS: ORAL_TABLET | ORAL | 0 refills | Status: DC

## 2018-10-08 NOTE — Progress Notes (Signed)
Subjective:     Patient ID: [name] Wright , female    DOB: 1965-09-19 , 53 y.o.   MRN: [id number]   Chief Complaint  Patient presents with  . Otalgia  . Rash    HPI  Otalgia  There is pain in the left ear. This is a new problem. The current episode started in the past 7 days. The problem occurs every few hours. The problem has been unchanged. There has been no fever. The pain is at a severity of 2/10. The pain is mild. Associated symptoms include a rash. Treatments tried: sudafed and allergy medications.  Rash This is a new problem. The current episode started 1 to 4 weeks ago. The problem is unchanged. Location: left axilla. The rash is characterized by itchiness. Pertinent negatives include no congestion, fatigue, fever or shortness of breath. Treatments tried: antifungal. The treatment provided mild relief. There is no history of allergies.     Past Medical History:  Diagnosis Date  . Acute cystitis   . Allergic rhinitis   . Childhood asthma    no problems as adult - no inhaler  . Chronic insomnia   . Chronic pain due to trauma 02/2001   closed head trauma - Followed by [facility]  . Complication of anesthesia   . Dyspepsia   . Head trauma 02/24/2001   closed  . History of kidney stones    passed stone - no surgery required  . Hypothyroidism   . PONV (postoperative nausea and vomiting)   . Pre-diabetes   . SVD (spontaneous vaginal delivery)    fetal demise at 5 months     Family History  Problem Relation Age of Onset  . Cancer Maternal Grandmother        ovarion     Current Outpatient Medications:  .  albuterol (PROAIR HFA) 108 (90 Base) MCG/ACT inhaler, Inhale 2 puffs into the lungs every 4 (four) hours as needed for wheezing or shortness of breath., Disp: 1 Inhaler, Rfl: 1 .  baclofen (LIORESAL) 10 MG tablet, Take 1/2-1 tab every 8 hours prn, Disp: , Rfl:  .  Carbinoxamine Maleate (RYVENT) 6 MG TABS, Take 6 mg by mouth 2  (two) times daily., Disp: 180 tablet, Rfl: 0 .  cetirizine (ZYRTEC) 10 MG tablet, Take 10 mg by mouth daily., Disp: , Rfl:  .  diazepam (VALIUM) 10 MG tablet, Take 10 mg by mouth every 6 (six) hours as needed for anxiety. , Disp: , Rfl:  .  diphenhydrAMINE (BENADRYL ALLERGY) 25 mg capsule, Take 25 mg by mouth every 6 (six) hours as needed., Disp: , Rfl:  .  esomeprazole (NEXIUM) 40 MG capsule, Take by mouth., Disp: , Rfl:  .  furosemide (LASIX) 80 MG tablet, Take 80 mg by mouth daily as needed for fluid., Disp: , Rfl:  .  ibuprofen (ADVIL,MOTRIN) 200 MG tablet, Take 200-400 mg by mouth every 6 (six) hours as needed for headache or moderate pain. , Disp: , Rfl:  .  levothyroxine (SYNTHROID) 88 MCG tablet, Take 1 tablet by mouth once daily, Disp: 60 tablet, Rfl: 0 .  medroxyPROGESTERone (PROVERA) 10 MG tablet, medroxyprogesterone 10 mg tablet  TAKE 1 TABLET BY MOUTH ONCE DAILY FOR 10 DAYS, Disp: , Rfl:  .  methocarbamol (ROBAXIN) 750 MG tablet, methocarbamol 750 mg tablet, Disp: , Rfl:  .  morphine (MSIR) 15 MG tablet, Take 15 mg by mouth 4 (four) times daily as needed for severe pain. ,  Disp: , Rfl:  .  PROAIR RESPICLICK 423 (90 Base) MCG/ACT AEPB, INHALE 2 PUFFS BY MOUTH EVERY 4 HOURS INTO THE LUNGS AS NEEDED FOR WHEEZING OR SHORTNESS OF BREATH, Disp: , Rfl: 0 .  progesterone (PROMETRIUM) 200 MG capsule, TAKE 1 CAPSULE BY MOUTH ONCE DAILY IN THE EVENING FOR 10 DAYS, Disp: , Rfl:  .  promethazine (PHENERGAN) 25 MG tablet, Take 1 tablet (25 mg total) by mouth every 6 (six) hours as needed for nausea or vomiting., Disp: 12 tablet, Rfl: 0 .  promethazine (PHENERGAN) 25 MG tablet, TAKE 1 TO 2 TABLETS BY MOUTH EVERY 6 HOURS AS NEEDED, Disp: , Rfl:  .  pseudoephedrine (SUDAFED) 30 MG tablet, TAKE 1 TABLET BY MOUTH TWICE DAILY AS NEEDED FOR CONGESTION, Disp: 90 tablet, Rfl: 0 .  traMADol (ULTRAM) 50 MG tablet, Take 1 tablet (50 mg total) by mouth every 6 (six) hours as needed., Disp: 120 tablet, Rfl: 0 .   tranexamic acid (LYSTEDA) 650 MG TABS tablet, tranexamic acid 650 mg tablet  TAKE 2 TABLETS BY MOUTH THREE TIMES DAILY, Disp: , Rfl:  .  Vitamin D, Ergocalciferol, (DRISDOL) 1.25 MG (50000 UT) CAPS capsule, Take 1 capsule (50,000 Units total) by mouth every 7 (seven) days., Disp: 8 capsule, Rfl: 0   Allergies  Allergen Reactions  . Oxycodone-Acetaminophen Hives and Nausea And Vomiting  . Prochlorperazine Edisylate Other (See Comments)    Hyper and jitteriness  . Amoxicillin Rash and Other (See Comments)    Has patient had a PCN reaction causing immediate rash, facial/tongue/throat swelling, SOB or lightheadedness with hypotension: Unknown Has patient had a PCN reaction causing severe rash involving mucus membranes or skin necrosis: Unknown Has patient had a PCN reaction that required hospitalization: No Has patient had a PCN reaction occurring within the last 10 years: Yes If all of the above answers are "NO", then may proceed with Cephalosporin use.   . Compazine Other (See Comments)    Hyper/jitters/blotches  . Doxycycline Diarrhea and Other (See Comments)    Vomiting, headache  . Eszopiclone Other (See Comments)    Metallic taste and became un effective   . Keppra [Levetiracetam] Other (See Comments)    hyperactive  . Latex   . Levetiracetam Other (See Comments)    Jitters/hyper  . Neurontin [Gabapentin] Nausea And Vomiting  . Propoxyphene Hives  . Trileptal [Oxcarbazepine] Other (See Comments)    REACTION: immune system suppressed  . Bactrim [Sulfamethoxazole-Trimethoprim] Rash  . Bupropion Rash  . Codeine Rash  . Hydrocodone-Acetaminophen Rash  . Moxifloxacin Swelling and Rash  . Propoxyphene N-Acetaminophen Nausea And Vomiting and Rash  . Vicodin [Hydrocodone-Acetaminophen] Rash     Review of Systems  Constitutional: Negative for fatigue and fever.  HENT: Positive for ear pain. Negative for congestion.   Respiratory: Negative for shortness of breath.   Skin: Positive  for rash.     Today's Vitals   10/08/18 1205  BP: 130/70  Pulse: 82  Temp: 98.3 F (36.8 C)  TempSrc: Oral  SpO2: 98%  Weight: (!) 372 lb 9.6 oz (169 kg)  Height: 5' 4.2" (1.631 m)   Body mass index is 63.56 kg/m.   Objective:  Physical Exam Constitutional:      Appearance: Normal appearance.  HENT:     Right Ear: Ear canal and external ear normal. Tympanic membrane is bulging.     Left Ear: Ear canal and external ear normal. Tympanic membrane is erythematous and bulging.  Cardiovascular:  Rate and Rhythm: Normal rate and regular rhythm.     Pulses: Normal pulses.     Heart sounds: Normal heart sounds. No murmur.  Pulmonary:     Effort: Pulmonary effort is normal.     Breath sounds: Normal breath sounds.  Skin:    General: Skin is warm and dry.     Capillary Refill: Capillary refill takes less than 2 seconds.     Findings: Rash present.  Neurological:     General: No focal deficit present.     Mental Status: She is alert and oriented to person, place, and time.  Psychiatric:        Mood and Affect: Mood normal.        Behavior: Behavior normal.        Thought Content: Thought content normal.        Judgment: Judgment normal.         Assessment And Plan:     1. Acquired hypothyroidism  Chronic, controlled  Continue with current medications - TSH - T3 - T4, Free - CBC no Diff  2. Otalgia, left  Mild erythema to left ear canal will treat with cipro - ciprofloxacin (CIPRO) 500 MG tablet; Take 1 tablet (500 mg total) by mouth 2 (two) times daily for 3 days.  Dispense: 20 tablet; Refill: 0  3. Rash and nonspecific skin eruption  Erythematous skin looks inflamed will treat with oral diflucan and steroid cream - fluconazole (DIFLUCAN) 150 MG tablet; Take 1 tab by mouth today repeat in 5 days  Dispense: 2 tablet; Refill: 0 - triamcinolone cream (KENALOG) 0.1 %; Apply 1 application topically 2 (two) times daily.  Dispense: 30 g; Refill: 0  4. Vitamin D  deficiency  Will check vitamin D level and supplement as needed.     Also encouraged to spend 15 minutes in the sun daily.  - Vitamin D (25 hydroxy)  5. Abnormal vaginal bleeding  She is having longer than usual menstrual cycles, will check hormone levels at patients request.   Will consider referral to GYN if no improvement - [facility]   [name], FNP    THE PATIENT IS ENCOURAGED TO PRACTICE SOCIAL DISTANCING DUE TO THE COVID-19 PANDEMIC.

## 2018-10-09 LAB — CBC
Hematocrit: 42.2 % (ref 34.0–46.6)
Hemoglobin: 14.1 g/dL (ref 11.1–15.9)
MCH: 28.6 pg (ref 26.6–33.0)
MCHC: 33.4 g/dL (ref 31.5–35.7)
MCV: 86 fL (ref 79–97)
Platelets: 307 10*3/uL (ref 150–450)
RBC: 4.93 x10E6/uL (ref 3.77–5.28)
RDW: 12.6 % (ref 11.7–15.4)
WBC: 7.9 10*3/uL (ref 3.4–10.8)

## 2018-10-09 LAB — FSH/LH
FSH: 20.9 m[IU]/mL
LH: 14.8 m[IU]/mL

## 2018-10-09 LAB — VITAMIN D 25 HYDROXY (VIT D DEFICIENCY, FRACTURES): Vit D, 25-Hydroxy: 25.4 ng/mL — ABNORMAL LOW (ref 30.0–100.0)

## 2018-10-09 LAB — T3: T3, Total: 125 ng/dL (ref 71–180)

## 2018-10-09 LAB — T4, FREE: Free T4: 1.34 ng/dL (ref 0.82–1.77)

## 2018-10-09 LAB — TSH: TSH: 0.934 u[IU]/mL (ref 0.450–4.500)

## 2018-10-24 ENCOUNTER — Encounter (HOSPITAL_COMMUNITY): Payer: Self-pay | Admitting: Urology

## 2018-10-27 ENCOUNTER — Encounter: Payer: Self-pay | Admitting: Nurse Practitioner

## 2018-10-27 ENCOUNTER — Other Ambulatory Visit: Payer: Self-pay | Admitting: Nurse Practitioner

## 2018-10-27 MED ORDER — AMOXICILLIN 875 MG PO TABS: 875.0000 mg | ORAL_TABLET | Freq: Two times a day (BID) | ORAL | 0 refills | Status: DC

## 2018-10-28 ENCOUNTER — Other Ambulatory Visit: Payer: Self-pay | Admitting: Nurse Practitioner

## 2018-10-28 DIAGNOSIS — R059 Cough, unspecified: Secondary | ICD-10-CM

## 2018-10-28 DIAGNOSIS — R05 Cough: Secondary | ICD-10-CM

## 2018-10-29 ENCOUNTER — Other Ambulatory Visit: Payer: Self-pay

## 2018-10-29 DIAGNOSIS — Z20822 Contact with and (suspected) exposure to covid-19: Secondary | ICD-10-CM

## 2018-11-02 LAB — SPECIMEN STATUS REPORT

## 2018-11-02 LAB — NOVEL CORONAVIRUS, NAA: SARS-CoV-2, NAA: NOT DETECTED

## 2018-11-05 ENCOUNTER — Telehealth: Payer: Self-pay

## 2018-11-05 NOTE — Telephone Encounter (Signed)
Left message. Need pt to complete cologard kit 

## 2018-11-12 ENCOUNTER — Other Ambulatory Visit: Payer: Self-pay | Admitting: Nurse Practitioner

## 2018-12-09 ENCOUNTER — Other Ambulatory Visit: Payer: Self-pay | Admitting: Nurse Practitioner

## 2018-12-22 ENCOUNTER — Other Ambulatory Visit: Payer: Self-pay | Admitting: Internal Medicine

## 2019-01-05 ENCOUNTER — Telehealth: Payer: Self-pay

## 2019-01-05 NOTE — Telephone Encounter (Signed)
Called pt to see why she hasnt done her cologard test and she said that she put it under the sink and forgot. Pt wants another one ordered.

## 2019-01-08 ENCOUNTER — Other Ambulatory Visit: Payer: Self-pay | Admitting: Internal Medicine

## 2019-01-08 DIAGNOSIS — Z1211 Encounter for screening for malignant neoplasm of colon: Secondary | ICD-10-CM

## 2019-01-10 ENCOUNTER — Encounter: Payer: Self-pay | Admitting: Nurse Practitioner

## 2019-01-13 ENCOUNTER — Encounter: Payer: Self-pay | Admitting: Internal Medicine

## 2019-01-15 ENCOUNTER — Ambulatory Visit: Payer: Medicare Other | Admitting: Internal Medicine

## 2019-01-15 ENCOUNTER — Encounter: Payer: Self-pay | Admitting: Internal Medicine

## 2019-01-21 ENCOUNTER — Encounter: Payer: Self-pay | Admitting: Internal Medicine

## 2019-01-21 ENCOUNTER — Other Ambulatory Visit: Payer: Self-pay | Admitting: Internal Medicine

## 2019-01-21 ENCOUNTER — Ambulatory Visit: Payer: Self-pay

## 2019-01-22 ENCOUNTER — Encounter: Payer: Self-pay | Admitting: Internal Medicine

## 2019-01-22 ENCOUNTER — Other Ambulatory Visit: Payer: Self-pay

## 2019-01-22 ENCOUNTER — Ambulatory Visit (INDEPENDENT_AMBULATORY_CARE_PROVIDER_SITE_OTHER): Payer: Medicare Other | Admitting: Internal Medicine

## 2019-01-22 VITALS — BP 136/88 | HR 77 | Temp 97.5°F | Ht 64.2 in | Wt 381.4 lb

## 2019-01-22 DIAGNOSIS — Z0001 Encounter for general adult medical examination with abnormal findings: Secondary | ICD-10-CM

## 2019-01-22 DIAGNOSIS — Z0000 Encounter for general adult medical examination without abnormal findings: Secondary | ICD-10-CM

## 2019-01-22 DIAGNOSIS — E559 Vitamin D deficiency, unspecified: Secondary | ICD-10-CM | POA: Diagnosis not present

## 2019-01-22 DIAGNOSIS — R7303 Prediabetes: Secondary | ICD-10-CM | POA: Diagnosis not present

## 2019-01-22 DIAGNOSIS — R635 Abnormal weight gain: Secondary | ICD-10-CM | POA: Diagnosis not present

## 2019-01-22 DIAGNOSIS — E781 Pure hyperglyceridemia: Secondary | ICD-10-CM | POA: Diagnosis not present

## 2019-01-22 DIAGNOSIS — R3915 Urgency of urination: Secondary | ICD-10-CM | POA: Diagnosis not present

## 2019-01-22 DIAGNOSIS — Z1211 Encounter for screening for malignant neoplasm of colon: Secondary | ICD-10-CM

## 2019-01-22 DIAGNOSIS — E039 Hypothyroidism, unspecified: Secondary | ICD-10-CM | POA: Diagnosis not present

## 2019-01-22 DIAGNOSIS — F5101 Primary insomnia: Secondary | ICD-10-CM | POA: Diagnosis not present

## 2019-01-22 DIAGNOSIS — R739 Hyperglycemia, unspecified: Secondary | ICD-10-CM | POA: Insufficient documentation

## 2019-01-22 DIAGNOSIS — M839 Adult osteomalacia, unspecified: Secondary | ICD-10-CM | POA: Diagnosis not present

## 2019-01-22 LAB — POCT URINALYSIS DIPSTICK
Bilirubin, UA: NEGATIVE
Glucose, UA: NEGATIVE
Ketones, UA: NEGATIVE
Leukocytes, UA: NEGATIVE
Nitrite, UA: NEGATIVE
Protein, UA: POSITIVE — AB
Spec Grav, UA: 1.025 (ref 1.010–1.025)
Urobilinogen, UA: 0.2 E.U./dL
pH, UA: 6 (ref 5.0–8.0)

## 2019-01-22 NOTE — Progress Notes (Signed)
Subjective:     Patient ID: [name] Wright , female    DOB: 04-09-1966 , 53 y.o.   MRN: [id number]   Chief Complaint  Patient presents with  . Annual Exam  . Urinary Tract Infection    burning when urinating, urgency     HPI 1- Herer for yearly physical. She gets her pap with her GYN, but has not had her mammogram yet.   2- Dysuria, urgency with dark color x 1 week. Is a little better, but the urgency is still present.    3-Has received cologuard test from her insurance. Last colonoscopy few years ago and was normal  Past Medical History:  Diagnosis Date  . Acute cystitis   . Allergic rhinitis   . Childhood asthma    no problems as adult - no inhaler  . Chronic insomnia   . Chronic pain due to trauma 02/2001   closed head trauma - Followed by Dr [facility]  . Complication of anesthesia   . Dyspepsia   . Head trauma 02/24/2001   closed  . History of kidney stones    passed stone - no surgery required  . Hypothyroidism   . PONV (postoperative nausea and vomiting)   . Pre-diabetes   . SVD (spontaneous vaginal delivery)    fetal demise at 5 months     Family History  Problem Relation Age of Onset  . Cancer Maternal Grandmother        ovarion     Current Outpatient Medications:  .  albuterol (PROAIR HFA) 108 (90 Base) MCG/ACT inhaler, Inhale 2 puffs into the lungs every 4 (four) hours as needed for wheezing or shortness of breath., Disp: 1 Inhaler, Rfl: 1 .  baclofen (LIORESAL) 10 MG tablet, Take 1/2-1 tab every 8 hours prn, Disp: , Rfl:  .  Carbinoxamine Maleate (RYVENT) 6 MG TABS, Take 6 mg by mouth 2 (two) times daily., Disp: 180 tablet, Rfl: 0 .  cetirizine (ZYRTEC) 10 MG tablet, Take 10 mg by mouth daily., Disp: , Rfl:  .  diazepam (VALIUM) 10 MG tablet, Take 10 mg by mouth every 6 (six) hours as needed for anxiety. , Disp: , Rfl:  .  diphenhydrAMINE (BENADRYL ALLERGY) 25 mg capsule, Take 25 mg by mouth every 6 (six) hours as  needed., Disp: , Rfl:  .  furosemide (LASIX) 80 MG tablet, Take 80 mg by mouth daily as needed for fluid., Disp: , Rfl:  .  ibuprofen (ADVIL,MOTRIN) 200 MG tablet, Take 200-400 mg by mouth every 6 (six) hours as needed for headache or moderate pain. , Disp: , Rfl:  .  levothyroxine (SYNTHROID) 88 MCG tablet, Take 1 tablet by mouth once daily, Disp: 60 tablet, Rfl: 0 .  methocarbamol (ROBAXIN) 750 MG tablet, methocarbamol 750 mg tablet, Disp: , Rfl:  .  morphine (MSIR) 15 MG tablet, Take 15 mg by mouth 4 (four) times daily as needed for severe pain. , Disp: , Rfl:  .  PROAIR RESPICLICK 123XX123 (90 Base) MCG/ACT AEPB, INHALE 2 PUFFS BY MOUTH EVERY 4 HOURS INTO THE LUNGS AS NEEDED FOR WHEEZING OR SHORTNESS OF BREATH, Disp: , Rfl: 0 .  promethazine (PHENERGAN) 25 MG tablet, Take 1 tablet (25 mg total) by mouth every 6 (six) hours as needed for nausea or vomiting., Disp: 12 tablet, Rfl: 0 .  promethazine (PHENERGAN) 25 MG tablet, TAKE 1 TO 2 TABLETS BY MOUTH EVERY 6 HOURS AS NEEDED, Disp: , Rfl:  .  pseudoephedrine (SUDAFED) 30 MG tablet, TAKE 1 TABLET BY MOUTH TWICE DAILY AS NEEDED FOR CONGESTION, Disp: 90 tablet, Rfl: 0 .  traMADol (ULTRAM) 50 MG tablet, Take 1 tablet (50 mg total) by mouth every 6 (six) hours as needed., Disp: 120 tablet, Rfl: 0 .  triamcinolone cream (KENALOG) 0.1 %, Apply 1 application topically 2 (two) times daily., Disp: 30 g, Rfl: 0 .  Vitamin D, Ergocalciferol, (DRISDOL) 1.25 MG (50000 UT) CAPS capsule, Take 1 capsule (50,000 Units total) by mouth every 7 (seven) days., Disp: 8 capsule, Rfl: 0   Allergies  Allergen Reactions  . Oxycodone-Acetaminophen Hives and Nausea And Vomiting  . Prochlorperazine Edisylate Other (See Comments)    Hyper and jitteriness  . Amoxicillin Rash and Other (See Comments)    Has patient had a PCN reaction causing immediate rash, facial/tongue/throat swelling, SOB or lightheadedness with hypotension: Unknown Has patient had a PCN reaction causing  severe rash involving mucus membranes or skin necrosis: Unknown Has patient had a PCN reaction that required hospitalization: No Has patient had a PCN reaction occurring within the last 10 years: Yes If all of the above answers are "NO", then may proceed with Cephalosporin use.   . Compazine Other (See Comments)    Hyper/jitters/blotches  . Doxycycline Diarrhea and Other (See Comments)    Vomiting, headache  . Eszopiclone Other (See Comments)    Metallic taste and became un effective   . Keppra [Levetiracetam] Other (See Comments)    hyperactive  . Latex   . Levetiracetam Other (See Comments)    Jitters/hyper  . Neurontin [Gabapentin] Nausea And Vomiting  . Propoxyphene Hives  . Trileptal [Oxcarbazepine] Other (See Comments)    REACTION: immune system suppressed  . Bactrim [Sulfamethoxazole-Trimethoprim] Rash  . Bupropion Rash  . Codeine Rash  . Hydrocodone-Acetaminophen Rash  . Moxifloxacin Swelling and Rash  . Propoxyphene N-Acetaminophen Nausea And Vomiting and Rash  . Vicodin [Hydrocodone-Acetaminophen] Rash     Review of Systems  + fatigued, swelling, dysuria, urgency frequency, HA x 11/2 days from wearing her mask. She feels her memory is getting worse this year, and is off antiseizure meds. Sleeping more, but does not feel rested, wakes up after 4h and then cant get back to sleep fully . Did not have sleep apnea 10 years ago when she had it done, but she is 40 lb heavier now. The rest of ROS is neg.  Today's Vitals   01/22/19 1029  BP: 136/88  Pulse: 77  Temp: (!) 97.5 F (36.4 C)  TempSrc: Oral  Weight: (!) 381 lb 6.4 oz (173 kg)  Height: 5' 4.2" (1.631 m)   Body mass index is 65.06 kg/m.   Objective:  Physical Exam BP 136/88 (BP Location: Left Arm, Patient Position: Sitting, Cuff Size: Large)   Pulse 77   Temp (!) 97.5 F (36.4 C) (Oral)   Ht 5' 4.2" (1.631 m)   Wt (!) 381 lb 6.4 oz (173 kg)   BMI 65.06 kg/m   General Appearance:    Alert, cooperative,  no distress, appears stated age  Head:    Normocephalic, without obvious abnormality, atraumatic  Eyes:    PERRL, conjunctiva/corneas clear, EOM's intact, fundi    benign, both eyes  Ears:    Normal TM's and external ear canals, both ears  Nose:   Nares normal, septum midline, mucosa normal, no drainage    or sinus tenderness  Throat:   Lips, mucosa, and tongue normal; teeth and gums normal  Neck:   Supple, symmetrical, trachea midline, no adenopathy;    thyroid:  no enlargement/tenderness/nodules; no carotid   Bruit.  Back:     Symmetric, no curvature, ROM normal, no CVA tenderness  Lungs:     Clear to auscultation bilaterally, respirations unlabored  Chest Wall:    No tenderness or deformity   Heart:    Regular rate and rhythm, S1 and S2 normal, no murmur, rub   or gallop  Breast Exam:    No tenderness, masses, or nipple abnormality  Abdomen:     Soft, non-tender, bowel sounds active all four quadrants,    no masses, no organomegaly  Genitalia:    Normal female without lesion, discharge or tenderness  Rectal:    Normal tone, normal prostate, no masses or tenderness;   guaiac negative stool  Extremities:   Extremities normal, atraumatic, no cyanosis or edema  Pulses:   2+ and symmetric all extremities  Skin:   Skin color, texture, turgor normal, no rashes or lesions  Lymph nodes:   Cervical, supraclavicular, and axillary nodes normal  Neurologic:   CNII-XII intact, normal strength, sensation and reflexes    Throughout. Normal Romberg, tandem gait, finger to nose, tip toe and heel gait.       Assessment And Plan:  1. Urgency of urination- acute - POCT Urinalysis Dipstick (81002)- neg - Culture, Urine  2. Primary insomnia- chronic. - CMP14 + Anion Gap - CBC no Diff - Ambulatory referral to Sleep Studies  3. Weight gain- chronic - TSH - T3, free - T4, Free  4. Encounter for general adult medical examination with abnormal findings- routine. FU 1 y - CBC no Diff  5.  Acquired hypothyroidism- chronic.      Thyroid studies ordered     May continue current med.  6. Vitamin D deficiency- chronic. She may continue current dose - Vitamin D 1,25 dihydroxy  7. Class 3 severe obesity in adult, unspecified BMI, unspecified obesity type, unspecified whether serious comorbidity present ([place])- worsening  - Amb Ref to Medical Weight Management  8. Prediabetes- chronic - Hemoglobin A1c  FU in 6 months Jamia Hoban RODRIGUEZ-SOUTHWORTH, PA-C    THE PATIENT IS ENCOURAGED TO PRACTICE SOCIAL DISTANCING DUE TO THE COVID-19 PANDEMIC.

## 2019-01-22 NOTE — Patient Instructions (Signed)

## 2019-01-24 LAB — URINE CULTURE

## 2019-01-27 LAB — CMP14 + ANION GAP
ALT: 21 IU/L (ref 0–32)
AST: 21 IU/L (ref 0–40)
Albumin/Globulin Ratio: 1.3 (ref 1.2–2.2)
Albumin: 4.2 g/dL (ref 3.8–4.9)
Alkaline Phosphatase: 108 IU/L (ref 39–117)
Anion Gap: 14 mmol/L (ref 10.0–18.0)
BUN/Creatinine Ratio: 21 (ref 9–23)
BUN: 16 mg/dL (ref 6–24)
Bilirubin Total: 0.4 mg/dL (ref 0.0–1.2)
CO2: 22 mmol/L (ref 20–29)
Calcium: 9.2 mg/dL (ref 8.7–10.2)
Chloride: 102 mmol/L (ref 96–106)
Creatinine, Ser: 0.78 mg/dL (ref 0.57–1.00)
GFR calc Af Amer: 101 mL/min/{1.73_m2} (ref >59)
GFR calc non Af Amer: 88 mL/min/{1.73_m2} (ref >59)
Globulin, Total: 3.3 g/dL (ref 1.5–4.5)
Glucose: 116 mg/dL — ABNORMAL HIGH (ref 65–99)
Potassium: 4.2 mmol/L (ref 3.5–5.2)
Sodium: 138 mmol/L (ref 134–144)
Total Protein: 7.5 g/dL (ref 6.0–8.5)

## 2019-01-27 LAB — LIPID PANEL
Chol/HDL Ratio: 3.7 ratio (ref 0.0–4.4)
Cholesterol, Total: 176 mg/dL (ref 100–199)
HDL: 47 mg/dL (ref >39)
LDL Chol Calc (NIH): 103 mg/dL — ABNORMAL HIGH (ref 0–99)
Triglycerides: 150 mg/dL — ABNORMAL HIGH (ref 0–149)
VLDL Cholesterol Cal: 26 mg/dL (ref 5–40)

## 2019-01-27 LAB — T3, FREE: T3, Free: 3.2 pg/mL (ref 2.0–4.4)

## 2019-01-27 LAB — CBC
Hematocrit: 42.2 % (ref 34.0–46.6)
Hemoglobin: 13.5 g/dL (ref 11.1–15.9)
MCH: 29 pg (ref 26.6–33.0)
MCHC: 32 g/dL (ref 31.5–35.7)
MCV: 91 fL (ref 79–97)
Platelets: 304 10*3/uL (ref 150–450)
RBC: 4.66 x10E6/uL (ref 3.77–5.28)
RDW: 13.4 % (ref 11.7–15.4)
WBC: 7.7 10*3/uL (ref 3.4–10.8)

## 2019-01-27 LAB — HEMOGLOBIN A1C
Est. average glucose Bld gHb Est-mCnc: 131 mg/dL
Hgb A1c MFr Bld: 6.2 % — ABNORMAL HIGH (ref 4.8–5.6)

## 2019-01-27 LAB — T4, FREE: Free T4: 1.63 ng/dL (ref 0.82–1.77)

## 2019-01-27 LAB — VITAMIN D 1,25 DIHYDROXY
Vitamin D 1, 25 (OH)2 Total: 25 pg/mL
Vitamin D2 1, 25 (OH)2: <10 pg/mL
Vitamin D3 1, 25 (OH)2: 21 pg/mL

## 2019-01-27 LAB — TSH: TSH: 1.67 u[IU]/mL (ref 0.450–4.500)

## 2019-01-30 ENCOUNTER — Encounter: Payer: Self-pay | Admitting: Internal Medicine

## 2019-02-03 ENCOUNTER — Encounter: Payer: Self-pay | Admitting: Nurse Practitioner

## 2019-02-03 ENCOUNTER — Other Ambulatory Visit: Payer: Self-pay

## 2019-02-03 ENCOUNTER — Telehealth (INDEPENDENT_AMBULATORY_CARE_PROVIDER_SITE_OTHER): Payer: Medicare Other | Admitting: Nurse Practitioner

## 2019-02-03 VITALS — Wt 381.0 lb

## 2019-02-03 DIAGNOSIS — R42 Dizziness and giddiness: Secondary | ICD-10-CM | POA: Diagnosis not present

## 2019-02-03 DIAGNOSIS — J3489 Other specified disorders of nose and nasal sinuses: Secondary | ICD-10-CM | POA: Diagnosis not present

## 2019-02-03 DIAGNOSIS — J309 Allergic rhinitis, unspecified: Secondary | ICD-10-CM | POA: Diagnosis not present

## 2019-02-03 MED ORDER — AZELASTINE HCL 0.1 % NA SOLN: 2.0000 | Freq: Two times a day (BID) | NASAL | 3 refills | Status: DC

## 2019-02-03 NOTE — Progress Notes (Signed)
Virtual Visit via Video   This visit type was conducted due to national recommendations for restrictions regarding the COVID-19 Pandemic (e.g. social distancing) in an effort to limit this patient's exposure and mitigate transmission in our community.  Due to her co-morbid illnesses, this patient is at least at moderate risk for complications without adequate follow up.  This format is felt to be most appropriate for this patient at this time.  All issues noted in this document were discussed and addressed.  A limited physical exam was performed with this format.    This visit type was conducted due to national recommendations for restrictions regarding the COVID-19 Pandemic (e.g. social distancing) in an effort to limit this patient's exposure and mitigate transmission in our community.  Patients identity confirmed using two different identifiers.  This format is felt to be most appropriate for this patient at this time.  All issues noted in this document were discussed and addressed.  No physical exam was performed (except for noted visual exam findings with Video Visits).    Date:  02/03/2019   ID:  [name] Wright, DOB 06/11/1965, MRN [id number]  Patient Location:  Home - spoke with Arvid Right  Provider location:   Office    Chief Complaint:  dizziness  History of Present Illness:    [name] Wright is a 53 y.o. female who presents via video conferencing for a telehealth visit today.    The patient does not have symptoms concerning for COVID-19 infection (fever, chills, cough, or new shortness of breath).   She is taking melatonin 3mg  from Thursday - she was advised to take 2 tablets then on Friday morning she had extreme dizziness.  She continues to have symptoms of sinuses.  She is having mucous that was clear to cloudy to yellow.  She also has a pressure headache. She has increased drainage. She has been taking Sudafed, Ibuprofen, Zyrtec. She also took meclizine  and phenergan. She does not have a nasal spray. She is home alone today.  She was able to take amoxicillin - she took the phenergan in the morning with her amoxicillin and with food.    Sinus Problem This is a recurrent problem. Associated symptoms include sinus pressure. Pertinent negatives include no chills, congestion, coughing, headaches, shortness of breath, sneezing or sore throat. Past treatments include oral decongestants.     Past Medical History:  Diagnosis Date  . Acute cystitis   . Allergic rhinitis   . Childhood asthma    no problems as adult - no inhaler  . Chronic insomnia   . Chronic pain due to trauma 02/2001   closed head trauma - Followed by [facility]  . Complication of anesthesia   . Dyspepsia   . Head trauma 02/24/2001   closed  . History of kidney stones    passed stone - no surgery required  . Hypothyroidism   . PONV (postoperative nausea and vomiting)   . Pre-diabetes   . SVD (spontaneous vaginal delivery)    fetal demise at 5 months   Past Surgical History:  Procedure Laterality Date  . APPENDECTOMY    . DILATATION & CURETTAGE/HYSTEROSCOPY WITH MYOSURE N/A 04/15/2017   Procedure: DILATATION & CURETTAGE/HYSTEROSCOPY WITH MYOSURE POLYPECTOMY;  Surgeon: Everlene Farrier, MD;  Location: [facility];  Service: Gynecology;  Laterality: N/A;  . INCONTINENCE SURGERY    . PATELLA FRACTURE SURGERY     patient denies this surgery  . repair of toe  laceration     dermabond to right big toe right foot  . repair of torn ligament right leg     patient denies this surgery  . right foot fracture     cast only  . TONSILLECTOMY    . WISDOM TOOTH EXTRACTION       Current Meds  Medication Sig  . albuterol (PROAIR HFA) 108 (90 Base) MCG/ACT inhaler Inhale 2 puffs into the lungs every 4 (four) hours as needed for wheezing or shortness of breath.  . baclofen (LIORESAL) 10 MG tablet Take 1/2-1 tab every 8 hours prn  . Carbinoxamine Maleate  (RYVENT) 6 MG TABS Take 6 mg by mouth 2 (two) times daily.  . cetirizine (ZYRTEC) 10 MG tablet Take 10 mg by mouth daily.  . diazepam (VALIUM) 10 MG tablet Take 10 mg by mouth 4 (four) times daily as needed for anxiety.   . diphenhydrAMINE (BENADRYL ALLERGY) 25 mg capsule Take 25 mg by mouth every 6 (six) hours as needed.  . furosemide (LASIX) 80 MG tablet Take 80 mg by mouth daily as needed for fluid.  Marland Kitchen ibuprofen (ADVIL,MOTRIN) 200 MG tablet Take 200-400 mg by mouth every 6 (six) hours as needed for headache or moderate pain.   Marland Kitchen levothyroxine (SYNTHROID) 88 MCG tablet Take 1 tablet by mouth once daily  . methocarbamol (ROBAXIN) 750 MG tablet methocarbamol 750 mg tablet  . morphine (MSIR) 15 MG tablet Take 15 mg by mouth 4 (four) times daily as needed for severe pain.   Marland Kitchen PROAIR RESPICLICK 123XX123 (90 Base) MCG/ACT AEPB INHALE 2 PUFFS BY MOUTH EVERY 4 HOURS INTO THE LUNGS AS NEEDED FOR WHEEZING OR SHORTNESS OF BREATH  . promethazine (PHENERGAN) 25 MG tablet Take 1 tablet (25 mg total) by mouth every 6 (six) hours as needed for nausea or vomiting.  . pseudoephedrine (SUDAFED) 30 MG tablet TAKE 1 TABLET BY MOUTH TWICE DAILY AS NEEDED FOR CONGESTION  . traMADol (ULTRAM) 50 MG tablet Take 1 tablet (50 mg total) by mouth every 6 (six) hours as needed.  . triamcinolone cream (KENALOG) 0.1 % Apply 1 application topically 2 (two) times daily.     Allergies:   Oxycodone-acetaminophen, Prochlorperazine edisylate, Amoxicillin, Compazine, Doxycycline, Eszopiclone, Keppra [levetiracetam], Latex, Levetiracetam, Neurontin [gabapentin], Propoxyphene, Trileptal [oxcarbazepine], Bactrim [sulfamethoxazole-trimethoprim], Bupropion, Codeine, Hydrocodone-acetaminophen, Moxifloxacin, Propoxyphene n-acetaminophen, and Vicodin [hydrocodone-acetaminophen]   Social History   Tobacco Use  . Smoking status: Never Smoker  . Smokeless tobacco: Never Used  Substance Use Topics  . Alcohol use: No  . Drug use: Yes    Types:  Morphine    Comment: Morphine 15 mg tab 4xdaily prn     Family Hx: The patient's family history includes Cancer in her maternal grandmother.  ROS:   Please see the history of present illness.    Review of Systems  Constitutional: Negative for chills.  HENT: Positive for sinus pressure. Negative for congestion, sneezing and sore throat.   Respiratory: Negative for cough and shortness of breath.   Cardiovascular: Negative.   Neurological: Positive for dizziness. Negative for headaches.  Psychiatric/Behavioral: Negative.     All other systems reviewed and are negative.   Labs/Other Tests and Data Reviewed:    Recent Labs: 01/22/2019: ALT 21; BUN 16; Creatinine, Ser 0.78; Hemoglobin 13.5; Platelets 304; Potassium 4.2; Sodium 138; TSH 1.670   Recent Lipid Panel Lab Results  Component Value Date/Time   CHOL 176 01/22/2019 11:38 AM   TRIG 150 (H) 01/22/2019 11:38 AM   HDL 47  01/22/2019 11:38 AM   CHOLHDL 3.7 01/22/2019 11:38 AM   CHOLHDL 4 09/01/2015 10:47 AM   LDLCALC 103 (H) 01/22/2019 11:38 AM    Wt Readings from Last 3 Encounters:  02/03/19 (!) 381 lb (172.8 kg)  01/22/19 (!) 381 lb 6.4 oz (173 kg)  10/08/18 (!) 372 lb 9.6 oz (169 kg)     Exam:    Vital Signs:  Wt (!) 381 lb (172.8 kg)   LMP 01/20/2019 (Approximate)   BMI 64.99 kg/m     Physical Exam  Constitutional: She is oriented to person, place, and time. No distress.  Pulmonary/Chest: Effort normal. No respiratory distress.  Neurological: She is alert and oriented to person, place, and time.  Psychiatric: Mood, memory, affect and judgment normal.    ASSESSMENT & PLAN:    1. Sinus pressure  She is to take the azelastine twice a day if she is not better by Thursday evening she is to call back to the office due to the history of frequent sinus infections. - azelastine (ASTELIN) 0.1 % nasal spray; Place 2 sprays into both nostrils 2 (two) times daily. Use in each nostril as directed  Dispense: 30 mL;  Refill: 3  2. Dizziness  Intermittent however this is improving since stopping melatonin  3. Allergic rhinitis, unspecified seasonality, unspecified trigger  azestaline sent to pharmacy   COVID-19 Education: The signs and symptoms of COVID-19 were discussed with the patient and how to seek care for testing (follow up with PCP or arrange E-visit).  The importance of social distancing was discussed today.  Patient Risk:   After full review of this patients clinical status, I feel that they are at least moderate risk at this time.  Time:   Today, I have spent 12 minutes/ seconds with the patient with telehealth technology discussing above diagnoses.     Medication Adjustments/Labs and Tests Ordered: Current medicines are reviewed at length with the patient today.  Concerns regarding medicines are outlined above.   Tests Ordered: No orders of the defined types were placed in this encounter.   Medication Changes: Meds ordered this encounter  Medications  . azelastine (ASTELIN) 0.1 % nasal spray    Sig: Place 2 sprays into both nostrils 2 (two) times daily. Use in each nostril as directed    Dispense:  30 mL    Refill:  3    Disposition:  Follow up prn  Signed, [name], FNP

## 2019-02-10 ENCOUNTER — Encounter: Payer: Self-pay | Admitting: Nurse Practitioner

## 2019-02-10 ENCOUNTER — Other Ambulatory Visit: Payer: Self-pay | Admitting: Nurse Practitioner

## 2019-02-10 ENCOUNTER — Telehealth: Payer: Self-pay

## 2019-02-10 MED ORDER — LEVOFLOXACIN 750 MG PO TABS: 750.0000 mg | ORAL_TABLET | Freq: Every day | ORAL | 0 refills | Status: AC

## 2019-02-10 NOTE — Telephone Encounter (Signed)
error 

## 2019-02-16 ENCOUNTER — Institutional Professional Consult (permissible substitution): Payer: Medicare Other | Admitting: Neurology

## 2019-02-18 ENCOUNTER — Other Ambulatory Visit: Payer: Self-pay | Admitting: Nurse Practitioner

## 2019-03-02 ENCOUNTER — Other Ambulatory Visit: Payer: Self-pay | Admitting: Internal Medicine

## 2019-03-09 ENCOUNTER — Encounter: Payer: Self-pay | Admitting: Nurse Practitioner

## 2019-03-10 ENCOUNTER — Telehealth (INDEPENDENT_AMBULATORY_CARE_PROVIDER_SITE_OTHER): Payer: Medicare Other | Admitting: Nurse Practitioner

## 2019-03-10 ENCOUNTER — Other Ambulatory Visit: Payer: Self-pay

## 2019-03-10 ENCOUNTER — Encounter: Payer: Self-pay | Admitting: Nurse Practitioner

## 2019-03-10 VITALS — Wt 381.0 lb

## 2019-03-10 DIAGNOSIS — J3489 Other specified disorders of nose and nasal sinuses: Secondary | ICD-10-CM

## 2019-03-10 DIAGNOSIS — R0981 Nasal congestion: Secondary | ICD-10-CM | POA: Diagnosis not present

## 2019-03-10 DIAGNOSIS — N921 Excessive and frequent menstruation with irregular cycle: Secondary | ICD-10-CM | POA: Diagnosis not present

## 2019-03-10 MED ORDER — AMOXICILLIN 875 MG PO TABS: 875.0000 mg | ORAL_TABLET | Freq: Two times a day (BID) | ORAL | 0 refills | Status: DC

## 2019-03-10 MED ORDER — FLUCONAZOLE 150 MG PO TABS: ORAL_TABLET | ORAL | 0 refills | Status: DC

## 2019-03-10 NOTE — Progress Notes (Signed)
Virtual Visit via Telephone   This visit type was conducted due to national recommendations for restrictions regarding the COVID-19 Pandemic (e.g. social distancing) in an effort to limit this patient's exposure and mitigate transmission in our community.  Due to her co-morbid illnesses, this patient is at least at moderate risk for complications without adequate follow up.  This format is felt to be most appropriate for this patient at this time.  All issues noted in this document were discussed and addressed.  A limited physical exam was performed with this format.    This visit type was conducted due to national recommendations for restrictions regarding the COVID-19 Pandemic (e.g. social distancing) in an effort to limit this patient's exposure and mitigate transmission in our community.  Patients identity confirmed using two different identifiers.  This format is felt to be most appropriate for this patient at this time.  All issues noted in this document were discussed and addressed.  No physical exam was performed (except for noted visual exam findings with Video Visits).    Date:  03/18/2019   ID:  Amedeo Plenty, DOB 07/06/1965, MRN [id number]  Patient Location:  Home - spoke with Arvid Right  Provider location:   Office    Chief Complaint:  Sinus problems  History of Present Illness:    NIJIA BRITTEN is a 53 y.o. female who presents via video conferencing for a telehealth visit today.    The patient does not have symptoms concerning for COVID-19 infection (fever, chills, cough, or new shortness of breath).   She has seen an ENT in the past however he has retired.    She has been exposed to "multiple allergens", she has taken allergy injections.  Was going 3 times a week for allergy injections.  She had to take benadryl prior to injection due to reaction.  She is allergic to multiple trees and anything that blooms  She is currently on her menstrual cycle  for 1.5 weeks and is being followed by Gynecology.    Sinus Problem This is a chronic problem. The current episode started in the past 7 days. The problem has been gradually worsening since onset. There has been no fever. She is experiencing no pain. Associated symptoms include congestion and sinus pressure. Pertinent negatives include no chills, coughing, shortness of breath or sore throat.     Past Medical History:  Diagnosis Date  . Acute cystitis   . Allergic rhinitis   . Childhood asthma    no problems as adult - no inhaler  . Chronic insomnia   . Chronic pain due to trauma 02/2001   closed head trauma - Followed by [facility]  . Complication of anesthesia   . Dyspepsia   . Head trauma 02/24/2001   closed  . History of kidney stones    passed stone - no surgery required  . Hypothyroidism   . PONV (postoperative nausea and vomiting)   . Pre-diabetes   . SVD (spontaneous vaginal delivery)    fetal demise at 5 months   Past Surgical History:  Procedure Laterality Date  . APPENDECTOMY    . DILATATION & CURETTAGE/HYSTEROSCOPY WITH MYOSURE N/A 04/15/2017   Procedure: DILATATION & CURETTAGE/HYSTEROSCOPY WITH MYOSURE POLYPECTOMY;  Surgeon: Everlene Farrier, MD;  Location: [facility];  Service: Gynecology;  Laterality: N/A;  . INCONTINENCE SURGERY    . PATELLA FRACTURE SURGERY     patient denies this surgery  . repair of toe laceration  dermabond to right big toe right foot  . repair of torn ligament right leg     patient denies this surgery  . right foot fracture     cast only  . TONSILLECTOMY    . WISDOM TOOTH EXTRACTION       Current Meds  Medication Sig  . albuterol (PROAIR HFA) 108 (90 Base) MCG/ACT inhaler Inhale 2 puffs into the lungs every 4 (four) hours as needed for wheezing or shortness of breath.  . baclofen (LIORESAL) 10 MG tablet Take 1/2-1 tab every 8 hours prn  . Carbinoxamine Maleate (RYVENT) 6 MG TABS Take 6 mg by mouth 2  (two) times daily.  . cetirizine (ZYRTEC) 10 MG tablet Take 10 mg by mouth daily.  . diazepam (VALIUM) 10 MG tablet Take 10 mg by mouth 4 (four) times daily as needed for anxiety.   . diphenhydrAMINE (BENADRYL ALLERGY) 25 mg capsule Take 25 mg by mouth every 6 (six) hours as needed.  . furosemide (LASIX) 80 MG tablet Take 80 mg by mouth daily as needed for fluid.  Marland Kitchen ibuprofen (ADVIL,MOTRIN) 200 MG tablet Take 200-400 mg by mouth every 6 (six) hours as needed for headache or moderate pain.   Marland Kitchen levothyroxine (SYNTHROID) 88 MCG tablet Take 1 tablet by mouth once daily  . methocarbamol (ROBAXIN) 750 MG tablet methocarbamol 750 mg tablet  . morphine (MSIR) 15 MG tablet Take 15 mg by mouth 4 (four) times daily as needed for severe pain.   Marland Kitchen PROAIR RESPICLICK 123XX123 (90 Base) MCG/ACT AEPB INHALE 2 PUFFS BY MOUTH EVERY 4 HOURS INTO THE LUNGS AS NEEDED FOR WHEEZING OR SHORTNESS OF BREATH  . promethazine (PHENERGAN) 25 MG tablet Take 1 tablet (25 mg total) by mouth every 6 (six) hours as needed for nausea or vomiting.  . pseudoephedrine (SUDAFED) 30 MG tablet TAKE 1 TABLET BY MOUTH TWICE DAILY AS NEEDED FOR CONGESTION  . triamcinolone cream (KENALOG) 0.1 % Apply 1 application topically 2 (two) times daily.     Allergies:   Oxycodone-acetaminophen, Prochlorperazine edisylate, Amoxicillin, Compazine, Doxycycline, Eszopiclone, Keppra [levetiracetam], Latex, Levetiracetam, Neurontin [gabapentin], Propoxyphene, Trileptal [oxcarbazepine], Bactrim [sulfamethoxazole-trimethoprim], Bupropion, Codeine, Hydrocodone-acetaminophen, Moxifloxacin, Propoxyphene n-acetaminophen, and Vicodin [hydrocodone-acetaminophen]   Social History   Tobacco Use  . Smoking status: Never Smoker  . Smokeless tobacco: Never Used  Substance Use Topics  . Alcohol use: No  . Drug use: Yes    Types: Morphine    Comment: Morphine 15 mg tab 4xdaily prn     Family Hx: The patient's family history includes Cancer in her maternal  grandmother.  ROS:   Please see the history of present illness.    Review of Systems  Constitutional: Negative for chills and fever.  HENT: Positive for congestion, sinus pressure and sinus pain. Negative for sore throat.   Respiratory: Negative for cough, sputum production and shortness of breath.   Cardiovascular: Negative.   Neurological: Negative.   Psychiatric/Behavioral: Negative.     All other systems reviewed and are negative.   Labs/Other Tests and Data Reviewed:    Recent Labs: 01/22/2019: ALT 21; BUN 16; Creatinine, Ser 0.78; Hemoglobin 13.5; Platelets 304; Potassium 4.2; Sodium 138; TSH 1.670   Recent Lipid Panel Lab Results  Component Value Date/Time   CHOL 176 01/22/2019 11:38 AM   TRIG 150 (H) 01/22/2019 11:38 AM   HDL 47 01/22/2019 11:38 AM   CHOLHDL 3.7 01/22/2019 11:38 AM   CHOLHDL 4 09/01/2015 10:47 AM   LDLCALC 103 (H) 01/22/2019 11:38 AM  Wt Readings from Last 3 Encounters:  03/10/19 (!) 381 lb (172.8 kg)  02/03/19 (!) 381 lb (172.8 kg)  01/22/19 (!) 381 lb 6.4 oz (173 kg)     Exam:    Vital Signs:  Wt (!) 381 lb (172.8 kg)   LMP 02/28/2019   BMI 64.99 kg/m     Physical Exam  Constitutional: She is oriented to person, place, and time and well-developed, well-nourished, and in no distress. No distress.  Pulmonary/Chest: No respiratory distress.  Neurological: She is alert and oriented to person, place, and time.  Psychiatric: Mood, memory, affect and judgment normal.    ASSESSMENT & PLAN:    1. Sinus pressure  Since Saturday has had worsening sinus pressure with thick brown nasal congestion  She has persistent issues with her sinuses requiring multiple antibiotics, I am referring her to ENT for further evaluation her previous ENT has retired - Ambulatory referral to ENT - amoxicillin (AMOXIL) 875 MG tablet; Take 1 tablet (875 mg total) by mouth 2 (two) times daily.  Dispense: 20 tablet; Refill: 0 - fluconazole (DIFLUCAN) 150 MG  tablet; Take one tablet now repeat in 5 days.  Dispense: 2 tablet; Refill: 0  2. Nasal congestion  Will treat for possible sinus infection - Ambulatory referral to ENT - amoxicillin (AMOXIL) 875 MG tablet; Take 1 tablet (875 mg total) by mouth 2 (two) times daily.  Dispense: 20 tablet; Refill: 0 - fluconazole (DIFLUCAN) 150 MG tablet; Take one tablet now repeat in 5 days.  Dispense: 2 tablet; Refill: 0  3. Menorrhagia with irregular cycle  Menstrual cycle has been on for almost two weeks and she feels her immune system declines when her cycle is on.   She is being followed by gynecology   COVID-19 Education: The signs and symptoms of COVID-19 were discussed with the patient and how to seek care for testing (follow up with PCP or arrange E-visit).  The importance of social distancing was discussed today.  Patient Risk:   After full review of this patients clinical status, I feel that they are at least moderate risk at this time.  Time:   Today, I have spent 14.38 minutes/ seconds with th patien with telehealth technology discussing above diagnoses.     Medication Adjustments/Labs and Tests Ordered: Current medicines are reviewed at length with the patient today.  Concerns regarding medicines are outlined above.   Tests Ordered: Orders Placed This Encounter  Procedures  . Ambulatory referral to ENT    Medication Changes: Meds ordered this encounter  Medications  . amoxicillin (AMOXIL) 875 MG tablet    Sig: Take 1 tablet (875 mg total) by mouth 2 (two) times daily.    Dispense:  20 tablet    Refill:  0  . fluconazole (DIFLUCAN) 150 MG tablet    Sig: Take one tablet now repeat in 5 days.    Dispense:  2 tablet    Refill:  0    Disposition:  Follow up prn  Signed, [name], FNP

## 2019-03-12 ENCOUNTER — Other Ambulatory Visit (INDEPENDENT_AMBULATORY_CARE_PROVIDER_SITE_OTHER): Payer: Self-pay | Admitting: Emergency Medicine

## 2019-03-13 ENCOUNTER — Encounter: Payer: Self-pay | Admitting: Nurse Practitioner

## 2019-03-13 ENCOUNTER — Other Ambulatory Visit: Payer: Self-pay

## 2019-03-13 NOTE — Telephone Encounter (Signed)
Yes

## 2019-03-17 ENCOUNTER — Encounter: Payer: Self-pay | Admitting: Nurse Practitioner

## 2019-03-26 ENCOUNTER — Encounter (HOSPITAL_COMMUNITY): Payer: Self-pay | Admitting: Internal Medicine

## 2019-04-07 DIAGNOSIS — R5383 Other fatigue: Secondary | ICD-10-CM | POA: Diagnosis not present

## 2019-04-07 DIAGNOSIS — G43009 Migraine without aura, not intractable, without status migrainosus: Secondary | ICD-10-CM | POA: Diagnosis not present

## 2019-04-07 DIAGNOSIS — R202 Paresthesia of skin: Secondary | ICD-10-CM | POA: Diagnosis not present

## 2019-04-07 DIAGNOSIS — R519 Headache, unspecified: Secondary | ICD-10-CM | POA: Diagnosis not present

## 2019-04-07 DIAGNOSIS — G894 Chronic pain syndrome: Secondary | ICD-10-CM | POA: Diagnosis not present

## 2019-04-07 DIAGNOSIS — S0990XS Unspecified injury of head, sequela: Secondary | ICD-10-CM | POA: Diagnosis not present

## 2019-04-13 ENCOUNTER — Other Ambulatory Visit: Payer: Self-pay | Admitting: Nurse Practitioner

## 2019-04-29 ENCOUNTER — Other Ambulatory Visit: Payer: Self-pay | Admitting: Nurse Practitioner

## 2019-05-19 ENCOUNTER — Telehealth (INDEPENDENT_AMBULATORY_CARE_PROVIDER_SITE_OTHER): Payer: Medicare Other | Admitting: Nurse Practitioner

## 2019-05-19 ENCOUNTER — Encounter: Payer: Self-pay | Admitting: Nurse Practitioner

## 2019-05-19 ENCOUNTER — Other Ambulatory Visit: Payer: Self-pay

## 2019-05-19 DIAGNOSIS — J3489 Other specified disorders of nose and nasal sinuses: Secondary | ICD-10-CM | POA: Diagnosis not present

## 2019-05-19 DIAGNOSIS — R0981 Nasal congestion: Secondary | ICD-10-CM | POA: Diagnosis not present

## 2019-05-19 MED ORDER — AMOXICILLIN 875 MG PO TABS: 875.0000 mg | ORAL_TABLET | Freq: Two times a day (BID) | ORAL | 0 refills | Status: DC

## 2019-05-19 NOTE — Progress Notes (Signed)
Virtual Visit via Telephone   This visit type was conducted due to national recommendations for restrictions regarding the COVID-19 Pandemic (e.g. social distancing) in an effort to limit this patient's exposure and mitigate transmission in our community.  Due to her co-morbid illnesses, this patient is at least at moderate risk for complications without adequate follow up.  This format is felt to be most appropriate for this patient at this time.  All issues noted in this document were discussed and addressed.  A limited physical exam was performed with this format.    This visit type was conducted due to national recommendations for restrictions regarding the COVID-19 Pandemic (e.g. social distancing) in an effort to limit this patient's exposure and mitigate transmission in our community.  Patients identity confirmed using two different identifiers.  This format is felt to be most appropriate for this patient at this time.  All issues noted in this document were discussed and addressed.  No physical exam was performed (except for noted visual exam findings with Video Visits).    Date:  05/19/2019   ID:  Amedeo Plenty, DOB 27-Oct-1965, MRN [id number]  Patient Location:  Home - spoke with Arvid Right  Provider location:   Office    Chief Complaint:  Sinus problems  History of Present Illness:    SILA MCCOIN is a 54 y.o. female who presents via video conferencing for a telehealth visit today.    The patient does not have symptoms concerning for COVID-19 infection (fever, chills, cough, or new shortness of breath).   She has been cleaning out storage.  She had normal allergies, mucous became cloudy, yellow to green.  Facial pain across her forehead.  She is coughing up yellow secretions and thick. When she blows her nose she has thick drainage.   She is having a polyp removed on March 17th with [facility].  She is unable to do her cologuard test due to  vaginal spotting.    Sinusitis This is a recurrent problem. The current episode started more than 1 year ago. There has been no fever. Associated symptoms include congestion, coughing, ear pain (ear itching) and headaches. Pertinent negatives include no chills.     Past Medical History:  Diagnosis Date  . Acute cystitis   . Allergic rhinitis   . Childhood asthma    no problems as adult - no inhaler  . Chronic insomnia   . Chronic pain due to trauma 02/2001   closed head trauma - Followed by [facility]  . Complication of anesthesia   . Dyspepsia   . Head trauma 02/24/2001   closed  . History of kidney stones    passed stone - no surgery required  . Hypothyroidism   . PONV (postoperative nausea and vomiting)   . Pre-diabetes   . SVD (spontaneous vaginal delivery)    fetal demise at 5 months   Past Surgical History:  Procedure Laterality Date  . APPENDECTOMY    . DILATATION & CURETTAGE/HYSTEROSCOPY WITH MYOSURE N/A 04/15/2017   Procedure: DILATATION & CURETTAGE/HYSTEROSCOPY WITH MYOSURE POLYPECTOMY;  Surgeon: Everlene Farrier, MD;  Location: [facility];  Service: Gynecology;  Laterality: N/A;  . INCONTINENCE SURGERY    . PATELLA FRACTURE SURGERY     patient denies this surgery  . repair of toe laceration     dermabond to right big toe right foot  . repair of torn ligament right leg     patient denies  this surgery  . right foot fracture     cast only  . TONSILLECTOMY    . WISDOM TOOTH EXTRACTION       Current Meds  Medication Sig  . albuterol (PROAIR HFA) 108 (90 Base) MCG/ACT inhaler Inhale 2 puffs into the lungs every 4 (four) hours as needed for wheezing or shortness of breath.  Marland Kitchen amoxicillin (AMOXIL) 875 MG tablet Take 1 tablet (875 mg total) by mouth 2 (two) times daily.  Marland Kitchen azelastine (ASTELIN) 0.1 % nasal spray Place 2 sprays into both nostrils 2 (two) times daily. Use in each nostril as directed  . baclofen (LIORESAL) 10 MG tablet Take  1/2-1 tab every 8 hours prn  . Carbinoxamine Maleate (RYVENT) 6 MG TABS Take 6 mg by mouth 2 (two) times daily.  . cetirizine (ZYRTEC) 10 MG tablet Take 10 mg by mouth daily.  . diazepam (VALIUM) 10 MG tablet Take 10 mg by mouth 4 (four) times daily as needed for anxiety.   . diphenhydrAMINE (BENADRYL ALLERGY) 25 mg capsule Take 25 mg by mouth every 6 (six) hours as needed.  . fluconazole (DIFLUCAN) 150 MG tablet Take one tablet now repeat in 5 days.  . furosemide (LASIX) 80 MG tablet Take 80 mg by mouth daily as needed for fluid.  Marland Kitchen ibuprofen (ADVIL,MOTRIN) 200 MG tablet Take 200-400 mg by mouth every 6 (six) hours as needed for headache or moderate pain.   Marland Kitchen levothyroxine (SYNTHROID) 88 MCG tablet Take 1 tablet by mouth once daily  . methocarbamol (ROBAXIN) 750 MG tablet methocarbamol 750 mg tablet  . morphine (MSIR) 15 MG tablet Take 15 mg by mouth 4 (four) times daily as needed for severe pain.   Marland Kitchen PROAIR RESPICLICK 123XX123 (90 Base) MCG/ACT AEPB INHALE 2 PUFFS BY MOUTH EVERY 4 HOURS INTO THE LUNGS AS NEEDED FOR WHEEZING OR SHORTNESS OF BREATH  . promethazine (PHENERGAN) 25 MG tablet Take 1 tablet (25 mg total) by mouth every 6 (six) hours as needed for nausea or vomiting.  . SUDOGEST MAXIMUM STRENGTH 30 MG tablet TAKE 1 TABLET BY MOUTH TWICE DAILY AS NEEDED FOR CONGESTION  . traMADol (ULTRAM) 50 MG tablet Take 1 tablet (50 mg total) by mouth every 6 (six) hours as needed.  . triamcinolone cream (KENALOG) 0.1 % Apply 1 application topically 2 (two) times daily.  . Vitamin D, Ergocalciferol, (DRISDOL) 1.25 MG (50000 UT) CAPS capsule Take 1 capsule (50,000 Units total) by mouth every 7 (seven) days.  . [DISCONTINUED] amoxicillin (AMOXIL) 875 MG tablet Take 1 tablet (875 mg total) by mouth 2 (two) times daily.     Allergies:   Oxycodone-acetaminophen, Prochlorperazine edisylate, Amoxicillin, Compazine, Doxycycline, Eszopiclone, Keppra [levetiracetam], Latex, Levetiracetam, Neurontin [gabapentin],  Propoxyphene, Trileptal [oxcarbazepine], Bactrim [sulfamethoxazole-trimethoprim], Bupropion, Codeine, Hydrocodone-acetaminophen, Moxifloxacin, Propoxyphene n-acetaminophen, and Vicodin [hydrocodone-acetaminophen]   Social History   Tobacco Use  . Smoking status: Never Smoker  . Smokeless tobacco: Never Used  Substance Use Topics  . Alcohol use: No  . Drug use: Yes    Types: Morphine    Comment: Morphine 15 mg tab 4xdaily prn     Family Hx: The patient's family history includes Cancer in her maternal grandmother.  ROS:   Please see the history of present illness.    Review of Systems  Constitutional: Negative for chills, fever and malaise/fatigue.       Her husband mentioned she   HENT: Positive for congestion and ear pain (ear itching).   Eyes: Negative.   Respiratory: Positive for  cough.   Cardiovascular: Negative.   Neurological: Positive for headaches.  Psychiatric/Behavioral: Negative.     All other systems reviewed and are negative.   Labs/Other Tests and Data Reviewed:    Recent Labs: 01/22/2019: ALT 21; BUN 16; Creatinine, Ser 0.78; Hemoglobin 13.5; Platelets 304; Potassium 4.2; Sodium 138; TSH 1.670   Recent Lipid Panel Lab Results  Component Value Date/Time   CHOL 176 01/22/2019 11:38 AM   TRIG 150 (H) 01/22/2019 11:38 AM   HDL 47 01/22/2019 11:38 AM   CHOLHDL 3.7 01/22/2019 11:38 AM   CHOLHDL 4 09/01/2015 10:47 AM   LDLCALC 103 (H) 01/22/2019 11:38 AM    Wt Readings from Last 3 Encounters:  03/10/19 (!) 381 lb (172.8 kg)  02/03/19 (!) 381 lb (172.8 kg)  01/22/19 (!) 381 lb 6.4 oz (173 kg)     Exam:    Vital Signs:  There were no vitals taken for this visit.    Physical Exam  Constitutional: She is oriented to person, place, and time and well-developed, well-nourished, and in no distress. No distress.  Pulmonary/Chest: Effort normal.  Neurological: She is alert and oriented to person, place, and time.  Psychiatric: Mood, memory, affect and  judgment normal.    ASSESSMENT & PLAN:    1. Sinus pressure  Related to recent cleaning of storage building  Will treat with antibiotics I have addressed with her again the importance of avoiding repeated treatment with antibiotics to lead to resistance  Due to having green drainage and secretions will treat with antibiotics. - amoxicillin (AMOXIL) 875 MG tablet; Take 1 tablet (875 mg total) by mouth 2 (two) times daily.  Dispense: 20 tablet; Refill: 0  2. Nasal congestion  Continue to use nasal spray  - amoxicillin (AMOXIL) 875 MG tablet; Take 1 tablet (875 mg total) by mouth 2 (two) times daily.  Dispense: 20 tablet; Refill: 0   COVID-19 Education: The signs and symptoms of COVID-19 were discussed with the patient and how to seek care for testing (follow up with PCP or arrange E-visit).  The importance of social distancing was discussed today.  Patient Risk:   After full review of this patients clinical status, I feel that they are at least moderate risk at this time.  Time:   Today, I have spent 6 minutes/ seconds with the patient with telehealth technology discussing above diagnoses.     Medication Adjustments/Labs and Tests Ordered: Current medicines are reviewed at length with the patient today.  Concerns regarding medicines are outlined above.   Tests Ordered: No orders of the defined types were placed in this encounter.   Medication Changes: Meds ordered this encounter  Medications  . amoxicillin (AMOXIL) 875 MG tablet    Sig: Take 1 tablet (875 mg total) by mouth 2 (two) times daily.    Dispense:  20 tablet    Refill:  0    Disposition:  Follow up prn  Signed, [name], FNP

## 2019-06-12 ENCOUNTER — Encounter (HOSPITAL_COMMUNITY)
Admission: RE | Admit: 2019-06-12 | Discharge: 2019-06-12 | Disposition: A | Discharge status: Home / Self Care | Payer: Medicare Other | Source: Ambulatory Visit | Attending: Obstetrics and Gynecology | Admitting: Obstetrics and Gynecology

## 2019-06-12 ENCOUNTER — Encounter (HOSPITAL_COMMUNITY): Payer: Self-pay

## 2019-06-12 ENCOUNTER — Other Ambulatory Visit: Payer: Self-pay

## 2019-06-12 DIAGNOSIS — Z01812 Encounter for preprocedural laboratory examination: Secondary | ICD-10-CM | POA: Diagnosis not present

## 2019-06-12 LAB — BASIC METABOLIC PANEL
Anion gap: 11 (ref 5–15)
BUN: 10 mg/dL (ref 6–20)
CO2: 24 mmol/L (ref 22–32)
Calcium: 9.3 mg/dL (ref 8.9–10.3)
Chloride: 102 mmol/L (ref 98–111)
Creatinine, Ser: 0.78 mg/dL (ref 0.44–1.00)
GFR calc Af Amer: >60 mL/min (ref >60)
GFR calc non Af Amer: >60 mL/min (ref >60)
Glucose, Bld: 162 mg/dL — ABNORMAL HIGH (ref 70–99)
Potassium: 4.3 mmol/L (ref 3.5–5.1)
Sodium: 137 mmol/L (ref 135–145)

## 2019-06-12 LAB — TYPE AND SCREEN
ABO/RH(D): O POS
Antibody Screen: NEGATIVE

## 2019-06-12 LAB — CBC
HCT: 39.3 % (ref 36.0–46.0)
Hemoglobin: 12.5 g/dL (ref 12.0–15.0)
MCH: 29.1 pg (ref 26.0–34.0)
MCHC: 31.8 g/dL (ref 30.0–36.0)
MCV: 91.4 fL (ref 80.0–100.0)
Platelets: 354 10*3/uL (ref 150–400)
RBC: 4.3 MIL/uL (ref 3.87–5.11)
RDW: 12.1 % (ref 11.5–15.5)
WBC: 9.9 10*3/uL (ref 4.0–10.5)
nRBC: 0 % (ref 0.0–0.2)

## 2019-06-12 LAB — HEMOGLOBIN A1C
Hgb A1c MFr Bld: 6.7 % — ABNORMAL HIGH (ref 4.8–5.6)
Mean Plasma Glucose: 145.59 mg/dL

## 2019-06-12 LAB — ABO/RH: ABO/RH(D): O POS

## 2019-06-12 LAB — GLUCOSE, CAPILLARY: Glucose-Capillary: 161 mg/dL — ABNORMAL HIGH (ref 70–99)

## 2019-06-12 NOTE — Pre-Procedure Instructions (Addendum)
[name] Wright  06/12/2019      [place], [place] [address] Phone: [phone] Fax: [phone]    Your procedure is scheduled on 06/18/19.  Report to [facility] Admitting at 5:30 A.M.  Call this number if you have problems the morning of surgery:  [phone]   Remember:  Do not eat after midnight.   You may drink clear liquids until 4:30 am .    Clear liquids allowed are: Water, Juice (non-citric and without pulp), Carbonated beverages, Clear Tea, Black Coffee only and Gatorade       Please complete your PRE-SURGERY     Water   that was provided to you by 4:30am the morning of surgery.  Please, if able, drink it in one sitting. DO NOT SIP.      Take these medications the morning of surgery with a sip of water: Albuterol (Proair) inhaler Baclofen (Lioresal) - if needed Cardinoxamine Maleate (Ryvent) - if needed Cetirizine (Zyrtec) Diazepam (Valium) - if needed Diphenhydramine (Benadryl) - if needed Levothyroxine (Synthroid) Morphine (MSIR) - if needed Promethazine (Phenergan) - if needed Tramadol (Ultram) - if needed  7 days prior to surgery STOP taking any Aspirin (unless otherwise instructed by your surgeon), Aleve, Naproxen, Ibuprofen, Motrin, Advil, Goody's, BC's, all herbal medications, fish oil, and all vitamins.   Do not wear jewelry, make-up or nail polish.  Do not wear lotions, powders, or perfumes, or deodorant.  Do not shave 48 hours prior to surgery.  Men may shave face and neck.  Do not bring valuables to the hospital.  [facility] is not responsible for any belongings or valuables.  Contacts, eyeglasses, dentures or partials may not be worn into surgery.  Please bring cases with you the morning of surgery.  Leave your suitcase in the car.  After surgery it may be brought to your room.  For patients admitted to the hospital, discharge time will be determined  by your treatment team.  Patients discharged the day of surgery will not be allowed to drive home.   Person - Preparing for Surgery  Before surgery, you can play an important role.  Because skin is not sterile, your skin needs to be as free of germs as possible.  You can reduce the number of germs on you skin by washing with CHG (chlorahexidine gluconate) soap before surgery.  CHG is an antiseptic cleaner which kills germs and bonds with the skin to continue killing germs even after washing.  Oral Hygiene is also important in reducing the risk of infection.  Remember to brush your teeth with your regular toothpaste the morning of surgery.  Please DO NOT use if you have an allergy to CHG or antibacterial soaps.  If your skin becomes reddened/irritated stop using the CHG and inform your nurse when you arrive at Short Stay.  Do not shave (including legs and underarms) for at least 48 hours prior to the first CHG shower.  You may shave your face.  Please follow these instructions carefully:   1.  Shower with CHG Soap the night before surgery and the morning of Surgery.  2.  If you choose to wash your hair, wash your hair first as usual with your normal shampoo.  3.  After you shampoo, rinse your hair and body thoroughly to remove the shampoo. 4.  Use CHG as you would any other liquid soap.  You  can apply chg directly to the skin and wash gently with a      scrungie or washcloth.           5.  Apply the CHG Soap to your body ONLY FROM THE NECK DOWN.   Do not use on open wounds or open sores. Avoid contact with your eyes, ears, mouth and genitals (private parts).  Wash genitals (private parts) with your normal soap.  6.  Wash thoroughly, paying special attention to the area where your surgery will be performed.  7.  Thoroughly rinse your body with warm water from the neck down.  8.  DO NOT shower/wash with your normal soap after using and rinsing off the CHG Soap.  9.  Pat yourself dry with a  clean towel.            10.  Wear clean pajamas.            11.  Place clean sheets on your bed the night of your first shower and do not sleep with pets.  Day of Surgery   Shower as stated above. Do not apply any lotions/deoderants the morning of surgery.   Please wear clean clothes to the hospital/surgery center. Remember to brush your teeth with toothpaste.     Please read over the following fact sheets that you were given.

## 2019-06-12 NOTE — Progress Notes (Signed)
PCP - Dr. Baird Cancer Cardiologist - Patient denies  PPM/ICD - n/a Device Orders -  Rep Notified -   Chest x-ray - n/a EKG - n/a Stress Test - Patient denies  ECHO - Patient denies Cardiac Cath - Patient denies  Sleep Study - Patient denies CPAP -   Fasting Blood Sugar - n/a Checks Blood Sugar _____ times a day Patient has a history of pre-diabetes.  Patient does not check CBG or take anything for DM.  Obtaining A1c at PAT   Blood Thinner Instructions: Aspirin Instructions:  ERAS Protcol - clears until 0430am DOS PRE-SURGERY Ensure or G2-   COVID TEST- scheduled for 06/15/19   Anesthesia review: n/a  Patient denies shortness of breath, fever, cough and chest pain at PAT appointment. Patient has elevated BP 160/90 at PAT appointment.  Recheck was 158/98.  Patient does not have a  History of HTN.  States she is anxious.   All instructions explained to the patient, with a verbal understanding of the material. Patient agrees to go over the instructions while at home for a better understanding. Patient also instructed to self quarantine after being tested for COVID-19. The opportunity to ask questions was provided.

## 2019-06-15 ENCOUNTER — Other Ambulatory Visit (HOSPITAL_COMMUNITY)
Admission: RE | Admit: 2019-06-15 | Discharge: 2019-06-15 | Disposition: A | Discharge status: Home / Self Care | Payer: Medicare Other | Source: Ambulatory Visit | Attending: Obstetrics and Gynecology | Admitting: Obstetrics and Gynecology

## 2019-06-15 DIAGNOSIS — Z01812 Encounter for preprocedural laboratory examination: Secondary | ICD-10-CM | POA: Insufficient documentation

## 2019-06-15 DIAGNOSIS — Z20822 Contact with and (suspected) exposure to covid-19: Secondary | ICD-10-CM | POA: Diagnosis not present

## 2019-06-16 LAB — SARS CORONAVIRUS 2 (TAT 6-24 HRS): SARS Coronavirus 2: NEGATIVE

## 2019-06-17 NOTE — H&P (Signed)
[name] Wright is an 54 y.o. female. She has very heavy, irregular menses. U/S in office 05/14/19 noted uterus 9.6 x 5.9 x 7.7 cm. Ovaries , right normal, unable to see left. SHG>11 x 8 mm polypoid mass. FSH is not menopausal.  Pertinent Gynecological History: Menses: flow is excessive with use of many pads or tampons on heaviest days Bleeding: dysfunctional uterine bleeding Contraception: vasectomy DES exposure: denies Blood transfusions: none Sexually transmitted diseases: no past history Previous GYN Procedures: DNC  Last mammogram: normal Date: 2018 Last pap: normal Date: 2018 OB History: G1, P0   Menstrual History: Menarche age: unknown No LMP recorded.    Past Medical History:  Diagnosis Date  . Acute cystitis   . Allergic rhinitis   . Childhood asthma    no problems as adult - no inhaler  . Chronic insomnia   . Chronic pain due to trauma 02/2001   closed head trauma - Followed by Dr [facility]  . Complication of anesthesia   . Dyspepsia   . Head trauma 02/24/2001   closed  . History of kidney stones    passed stone - no surgery required  . Hypothyroidism   . PONV (postoperative nausea and vomiting)   . Pre-diabetes   . SVD (spontaneous vaginal delivery)    fetal demise at 5 months    Past Surgical History:  Procedure Laterality Date  . APPENDECTOMY    . DILATATION & CURETTAGE/HYSTEROSCOPY WITH MYOSURE N/A 04/15/2017   Procedure: DILATATION & CURETTAGE/HYSTEROSCOPY WITH MYOSURE POLYPECTOMY;  Surgeon: Everlene Farrier, MD;  Location: [facility];  Service: Gynecology;  Laterality: N/A;  . EYE SURGERY     cataracts removed  . INCONTINENCE SURGERY    . PILONIDAL CYST EXCISION     x2  . repair of toe laceration     dermabond to right big toe right foot  . repair of torn ligament right leg     patient denies this surgery  . right foot fracture     cast only  . TONSILLECTOMY    . WISDOM TOOTH EXTRACTION      Family History   Problem Relation Age of Onset  . Cancer Maternal Grandmother        ovarion    Social History:  reports that she has never smoked. She has never used smokeless tobacco. She reports current drug use. Drug: Morphine. She reports that she does not drink alcohol.  Allergies:  Allergies  Allergen Reactions  . Doxycycline Anaphylaxis, Diarrhea and Nausea And Vomiting    headache  . Oxycodone-Acetaminophen Hives and Nausea And Vomiting  . Prochlorperazine Edisylate Other (See Comments)    Hyper and jitteriness  . Amoxicillin Rash and Other (See Comments)    Has patient had a PCN reaction causing immediate rash, facial/tongue/throat swelling, SOB or lightheadedness with hypotension: Unknown Has patient had a PCN reaction causing severe rash involving mucus membranes or skin necrosis: Unknown Has patient had a PCN reaction that required hospitalization: No Has patient had a PCN reaction occurring within the last 10 years: Yes If all of the above answers are "NO", then may proceed with Cephalosporin use.   . Compazine Other (See Comments)    Hyper/jitters/blotches  . Eszopiclone Other (See Comments)    Metallic taste and became un effective   . Keppra [Levetiracetam] Other (See Comments)    hyperactive  . Latex   . Levetiracetam Other (See Comments)    Jitters/hyper  . Neurontin [Gabapentin] Nausea  And Vomiting  . Propoxyphene Hives  . Trileptal [Oxcarbazepine] Other (See Comments)    REACTION: immune system suppressed  . Bactrim [Sulfamethoxazole-Trimethoprim] Rash  . Bupropion Rash  . Codeine Rash  . Hydrocodone-Acetaminophen Rash  . Moxifloxacin Swelling and Rash  . Propoxyphene N-Acetaminophen Nausea And Vomiting and Rash  . Vicodin [Hydrocodone-Acetaminophen] Rash    No medications prior to admission.    Review of Systems  Constitutional: Negative for fever.    There were no vitals taken for this visit. Physical Exam  Cardiovascular: Normal rate.  Respiratory:  Effort normal.  GI: Soft.    No results found for this or any previous visit (from the past 24 hour(s)).  No results found.  Assessment/Plan: 54 yo G1P0 premenopausal patient with recurrent menometorrhagia D/W H/S, D&C, possible Myosure resection. D/W risks including infection, uterine perforation and organ damage, bleeding/transfusion-HIV/Hep, DVT/PE, pneumonia, laparotomy. Patient states she understands and agrees  Shon Millet II 06/17/2019, 5:47 PM

## 2019-06-18 ENCOUNTER — Encounter (HOSPITAL_COMMUNITY): Payer: Self-pay | Admitting: Obstetrics and Gynecology

## 2019-06-18 ENCOUNTER — Ambulatory Visit (HOSPITAL_COMMUNITY): Payer: Medicare Other | Admitting: Anesthesiology

## 2019-06-18 ENCOUNTER — Encounter (HOSPITAL_COMMUNITY)
Admission: RE | Disposition: A | Discharge status: Home / Self Care | Payer: Self-pay | Source: Home / Self Care | Attending: Obstetrics and Gynecology

## 2019-06-18 ENCOUNTER — Other Ambulatory Visit: Payer: Self-pay

## 2019-06-18 ENCOUNTER — Ambulatory Visit (HOSPITAL_COMMUNITY)
Admission: RE | Admit: 2019-06-18 | Discharge: 2019-06-18 | Disposition: A | Discharge status: Home / Self Care | Payer: Medicare Other | Attending: Obstetrics and Gynecology | Admitting: Obstetrics and Gynecology

## 2019-06-18 DIAGNOSIS — Z91040 Latex allergy status: Secondary | ICD-10-CM | POA: Diagnosis not present

## 2019-06-18 DIAGNOSIS — Z882 Allergy status to sulfonamides status: Secondary | ICD-10-CM | POA: Diagnosis not present

## 2019-06-18 DIAGNOSIS — Z881 Allergy status to other antibiotic agents status: Secondary | ICD-10-CM | POA: Insufficient documentation

## 2019-06-18 DIAGNOSIS — N840 Polyp of corpus uteri: Secondary | ICD-10-CM | POA: Insufficient documentation

## 2019-06-18 DIAGNOSIS — N921 Excessive and frequent menstruation with irregular cycle: Secondary | ICD-10-CM | POA: Insufficient documentation

## 2019-06-18 DIAGNOSIS — Z880 Allergy status to penicillin: Secondary | ICD-10-CM | POA: Insufficient documentation

## 2019-06-18 DIAGNOSIS — Z885 Allergy status to narcotic agent status: Secondary | ICD-10-CM | POA: Diagnosis not present

## 2019-06-18 DIAGNOSIS — G4709 Other insomnia: Secondary | ICD-10-CM | POA: Diagnosis not present

## 2019-06-18 DIAGNOSIS — G8921 Chronic pain due to trauma: Secondary | ICD-10-CM | POA: Diagnosis not present

## 2019-06-18 DIAGNOSIS — Z888 Allergy status to other drugs, medicaments and biological substances status: Secondary | ICD-10-CM | POA: Insufficient documentation

## 2019-06-18 HISTORY — PX: DILATATION & CURETTAGE/HYSTEROSCOPY WITH MYOSURE: SHX6511

## 2019-06-18 LAB — GLUCOSE, CAPILLARY: Glucose-Capillary: 155 mg/dL — ABNORMAL HIGH (ref 70–99)

## 2019-06-18 LAB — POCT PREGNANCY, URINE: Preg Test, Ur: NEGATIVE

## 2019-06-18 SURGERY — DILATATION & CURETTAGE/HYSTEROSCOPY WITH MYOSURE
Anesthesia: General | Site: Uterus

## 2019-06-18 MED ORDER — FENTANYL CITRATE (PF) 250 MCG/5ML IJ SOLN
INTRAMUSCULAR | Status: AC
  Filled 2019-06-18: qty 5

## 2019-06-18 MED ORDER — SOD CITRATE-CITRIC ACID 500-334 MG/5ML PO SOLN
30.0000 mL | ORAL | Status: AC
  Administered 2019-06-18: 15 mL via ORAL
  Filled 2019-06-18: qty 30

## 2019-06-18 MED ORDER — CLINDAMYCIN PHOSPHATE 900 MG/50ML IV SOLN
900.0000 mg | INTRAVENOUS | Status: AC
  Administered 2019-06-18: 900 mg via INTRAVENOUS
  Filled 2019-06-18: qty 50

## 2019-06-18 MED ORDER — SODIUM CHLORIDE 0.9 % IR SOLN
Status: DC | PRN
  Administered 2019-06-18: 3000 mL

## 2019-06-18 MED ORDER — GENTAMICIN SULFATE 40 MG/ML IJ SOLN
500.0000 mg | INTRAVENOUS | Status: AC
  Administered 2019-06-18: 500 mg via INTRAVENOUS
  Filled 2019-06-18: qty 12.5

## 2019-06-18 MED ORDER — ONDANSETRON HCL 4 MG/2ML IJ SOLN: 4.0000 mg | Freq: Once | INTRAMUSCULAR | Status: DC | PRN

## 2019-06-18 MED ORDER — FENTANYL CITRATE (PF) 100 MCG/2ML IJ SOLN
INTRAMUSCULAR | Status: DC | PRN
  Administered 2019-06-18 (×4): 50 ug via INTRAVENOUS

## 2019-06-18 MED ORDER — ONDANSETRON HCL 4 MG/2ML IJ SOLN
INTRAMUSCULAR | Status: DC | PRN
  Administered 2019-06-18: 4 mg via INTRAVENOUS

## 2019-06-18 MED ORDER — LACTATED RINGERS IV SOLN: INTRAVENOUS | Status: DC

## 2019-06-18 MED ORDER — FENTANYL CITRATE (PF) 100 MCG/2ML IJ SOLN
25.0000 ug | INTRAMUSCULAR | Status: DC | PRN
  Administered 2019-06-18 (×2): 50 ug via INTRAVENOUS

## 2019-06-18 MED ORDER — PROPOFOL 500 MG/50ML IV EMUL
INTRAVENOUS | Status: DC | PRN
  Administered 2019-06-18: 75 ug/kg/min via INTRAVENOUS

## 2019-06-18 MED ORDER — MIDAZOLAM HCL 5 MG/5ML IJ SOLN
INTRAMUSCULAR | Status: DC | PRN
  Administered 2019-06-18: 2 mg via INTRAVENOUS

## 2019-06-18 MED ORDER — LIDOCAINE HCL (PF) 1 % IJ SOLN
INTRAMUSCULAR | Status: AC
  Filled 2019-06-18: qty 30

## 2019-06-18 MED ORDER — SUCCINYLCHOLINE CHLORIDE 20 MG/ML IJ SOLN
INTRAMUSCULAR | Status: DC | PRN
  Administered 2019-06-18: 160 mg via INTRAVENOUS

## 2019-06-18 MED ORDER — FENTANYL CITRATE (PF) 100 MCG/2ML IJ SOLN
INTRAMUSCULAR | Status: AC
  Filled 2019-06-18: qty 2

## 2019-06-18 MED ORDER — LIDOCAINE 2% (20 MG/ML) 5 ML SYRINGE
INTRAMUSCULAR | Status: DC | PRN
  Administered 2019-06-18: 60 mg via INTRAVENOUS

## 2019-06-18 MED ORDER — LIDOCAINE HCL 1 % IJ SOLN
INTRAMUSCULAR | Status: DC | PRN
  Administered 2019-06-18: 20 mL

## 2019-06-18 MED ORDER — PROPOFOL 10 MG/ML IV BOLUS
INTRAVENOUS | Status: DC | PRN
  Administered 2019-06-18: 200 mg via INTRAVENOUS

## 2019-06-18 MED ORDER — ACETAMINOPHEN 10 MG/ML IV SOLN
1000.0000 mg | Freq: Once | INTRAVENOUS | Status: AC
  Administered 2019-06-18: 1000 mg via INTRAVENOUS

## 2019-06-18 MED ORDER — MIDAZOLAM HCL 2 MG/2ML IJ SOLN
INTRAMUSCULAR | Status: AC
  Filled 2019-06-18: qty 2

## 2019-06-18 MED ORDER — DEXAMETHASONE SODIUM PHOSPHATE 4 MG/ML IJ SOLN
INTRAMUSCULAR | Status: DC | PRN
  Administered 2019-06-18: 10 mg via INTRAVENOUS

## 2019-06-18 MED ORDER — ACETAMINOPHEN 10 MG/ML IV SOLN
INTRAVENOUS | Status: AC
  Filled 2019-06-18: qty 100

## 2019-06-18 SURGICAL SUPPLY — 17 items
CNTNR URN SCR LID CUP LEK RST (MISCELLANEOUS) ×2 IMPLANT
CONT SPEC 4OZ STRL OR WHT (MISCELLANEOUS) ×2
DECANTER SPIKE VIAL GLASS SM (MISCELLANEOUS) ×2 IMPLANT
DEVICE MYOSURE LITE (MISCELLANEOUS) IMPLANT
GLOVE BIOGEL PI IND STRL 7.0 (GLOVE) ×1 IMPLANT
GLOVE BIOGEL PI INDICATOR 7.0 (GLOVE) ×1
GLOVE SURG SS PI 7.5 STRL IVOR (GLOVE) ×12 IMPLANT
GOWN STRL REUS W/ TWL LRG LVL3 (GOWN DISPOSABLE) ×4 IMPLANT
GOWN STRL REUS W/TWL LRG LVL3 (GOWN DISPOSABLE) ×4
KIT PROCEDURE FLUENT (KITS) ×2 IMPLANT
KIT TURNOVER KIT B (KITS) ×2 IMPLANT
NEEDLE SPNL 22GX3.5 QUINCKE BK (NEEDLE) ×2 IMPLANT
PACK VAGINAL MINOR WOMEN LF (CUSTOM PROCEDURE TRAY) ×2 IMPLANT
PAD OB MATERNITY 4.3X12.25 (PERSONAL CARE ITEMS) ×2 IMPLANT
SEAL ROD LENS SCOPE MYOSURE (ABLATOR) ×2 IMPLANT
TOWEL GREEN STERILE FF (TOWEL DISPOSABLE) ×4 IMPLANT
UNDERPAD 30X30 (UNDERPADS AND DIAPERS) ×6 IMPLANT

## 2019-06-18 NOTE — Op Note (Signed)
NAME: [name] Wright, DATA MEDICAL RECORD [id number] ACCOUNT [id number] DATE OF BIRTH:1965/09/19 FACILITY: [facility] LOCATION: [facility] PHYSICIAN:Amaal Dimartino E. Trudi Morgenthaler II, MD  OPERATIVE REPORT  DATE OF PROCEDURE:  06/18/2019  PREOPERATIVE DIAGNOSIS:  Menorrhagia.  POSTOPERATIVE DIAGNOSIS:  Menorrhagia.  PROCEDURE:  Hysteroscopy, dilation and curettage, MyoSure resection.  SURGEON:  Everlene Farrier, MD   ANESTHESIA:  Per anesthesia record.  ESTIMATED BLOOD LOSS:  Less than 50 mL.    INPUT AND OUTPUT OF DISTENDING MEDIA:  325 mL deficit.  SPECIMENS:  Endometrial curettings and endometrial polyp to pathology.  INDICATIONS AND CONSENT:  This patient is a 54 year old patient with heavy menses.  Details are dictated in the history and physical.  Hysteroscopy, dilation and curettage, possible MyoSure resection has been discussed preoperatively.  Potential risks  and complications were reviewed preoperatively including but not limited to infection, uterine perforation, organ damage, bleeding requiring transfusion of blood products with HIV and hepatitis acquisition, DVT, PE, pneumonia, recurrent abnormal bleeding  or polyps.  The patient states she understands and agrees and consent was signed on the chart.  FINDINGS:  Both fallopian tube ostia are identified.  There was a 1.5-2 cm polyp on a stalk in the 2 o'clock position of the mid endometrial cavity.  DESCRIPTION OF PROCEDURE:  The patient was taken to the operating room where she was identified, placed in the dorsal supine position and anesthesia was administered per anesthesiology records.  She was placed in the dorsal lithotomy position where she  was prepped, bladder straight catheterized, and draped in a sterile fashion.  Timeout was undertaken.  Bivalve speculum was placed in the vagina.  The anterior cervical lip was injected with 1% lidocaine and grasped with a single tooth tenaculum.   Paracervical block was placed at the 2, 4, 5,  7, 8 and 10 o'clock positions with approximately 20 mL of the same solution.  Cervix was gently progressively dilated.  Hysteroscope was placed in the endocervical canal and advanced under direct  visualization using distending media.  The above findings were noted.  Using the MyoSure resectoscope, the polyp was removed completely.  The hysteroscope was withdrawn and sharp curettage was carried out.  Reinspection with hysteroscope revealed the  cavity to be clean.  Instruments were removed.  Good hemostasis was noted.  All counts were correct.  The patient was awakened and taken to recovery room in stable condition.  CN/NUANCE  D:06/18/2019 T:06/18/2019 JOB:[id number]

## 2019-06-18 NOTE — Discharge Instructions (Signed)
No vaginal entry Follow up in office 2 weeks as scheduled

## 2019-06-18 NOTE — Anesthesia Preprocedure Evaluation (Signed)
Anesthesia Evaluation  Patient identified by MRN, date of birth, ID band Patient awake    Reviewed: Allergy & Precautions, NPO status , Patient's Chart, lab work & pertinent test results  Airway Mallampati: III  TM Distance: >3 FB Neck ROM: Full    Dental  (+) Teeth Intact, Dental Advisory Given   Pulmonary    breath sounds clear to auscultation       Cardiovascular  Rhythm:Regular Rate:Normal     Neuro/Psych    GI/Hepatic   Endo/Other    Renal/GU      Musculoskeletal   Abdominal (+) + obese,   Peds  Hematology   Anesthesia Other Findings   Reproductive/Obstetrics                             Anesthesia Physical Anesthesia Plan  ASA: II  Anesthesia Plan: General   Post-op Pain Management:    Induction: Intravenous  PONV Risk Score and Plan: Ondansetron and Dexamethasone  Airway Management Planned: Oral ETT  Additional Equipment:   Intra-op Plan:   Post-operative Plan: Extubation in OR  Informed Consent: I have reviewed the patients History and Physical, chart, labs and discussed the procedure including the risks, benefits and alternatives for the proposed anesthesia with the patient or authorized representative who has indicated his/her understanding and acceptance.     Dental advisory given  Plan Discussed with: CRNA and Anesthesiologist  Anesthesia Plan Comments:         Anesthesia Quick Evaluation

## 2019-06-18 NOTE — Anesthesia Procedure Notes (Signed)
Procedure Name: Intubation Date/Time: 06/18/2019 7:37 AM Performed by: Eulas Post, Cheila Wickstrom W, CRNA Pre-anesthesia Checklist: Patient identified, Emergency Drugs available, Suction available and Patient being monitored Patient Re-evaluated:Patient Re-evaluated prior to induction Oxygen Delivery Method: Circle system utilized Preoxygenation: Pre-oxygenation with 100% oxygen Induction Type: IV induction Ventilation: Mask ventilation without difficulty Laryngoscope Size: Glidescope Grade View: Grade I Tube type: Oral Number of attempts: 1 Airway Equipment and Method: Stylet and Video-laryngoscopy Placement Confirmation: ETT inserted through vocal cords under direct vision,  positive ETCO2 and breath sounds checked- equal and bilateral Secured at: 22 cm Tube secured with: Tape Dental Injury: Teeth and Oropharynx as per pre-operative assessment  Comments: Elective glidescope

## 2019-06-18 NOTE — Transfer of Care (Signed)
Immediate Anesthesia Transfer of Care Note  Patient: [name] Wright  Procedure(s) Performed: DILATATION & CURETTAGE/HYSTEROSCOPY WITH  MYOSURE (N/A Uterus)  Patient Location: PACU  Anesthesia Type:General  Level of Consciousness: awake  Airway & Oxygen Therapy: Patient Spontanous Breathing and Patient connected to face mask oxygen  Post-op Assessment: Report given to RN and Post -op Vital signs reviewed and stable  Post vital signs: Reviewed and stable  Last Vitals:  Vitals Value Taken Time  BP 145/97 06/18/19 0823  Temp    Pulse 90 06/18/19 0825  Resp 16 06/18/19 0825  SpO2 99 % 06/18/19 0825  Vitals shown include unvalidated device data.  Last Pain:  Vitals:   06/18/19 0617  TempSrc:   PainSc: 2       Patients Stated Pain Goal: 5 ([id number])  Complications: No apparent anesthesia complications

## 2019-06-18 NOTE — Progress Notes (Signed)
06/18/2019  8:09 AM  PATIENT:  [name] Wright  54 y.o. female  PRE-OPERATIVE DIAGNOSIS:  menorrhagia  POST-OPERATIVE DIAGNOSIS:  menorrhagia  PROCEDURE:  Procedure(s): DILATATION & CURETTAGE/HYSTEROSCOPY WITH possible MYOSURE (N/A)  SURGEON:  Surgeon(s) and Role:    Everlene Farrier, MD - Primary  PHYSICIAN ASSISTANT:   ASSISTANTS: none   ANESTHESIA:   IV sedation and paracervical block  EBL:  25 mL   BLOOD ADMINISTERED:none  DRAINS: none   LOCAL MEDICATIONS USED:  LIDOCAINE  and Amount: 20 ml  SPECIMEN:  Source of Specimen:  endometrial currettings, endometrial polyp  DISPOSITION OF SPECIMEN:  PATHOLOGY  COUNTS:  YES  TOURNIQUET:  * No tourniquets in log *  DICTATION: .Other Dictation: Dictation Number [id number]  PLAN OF CARE: Discharge to home after PACU  PATIENT DISPOSITION:  PACU - hemodynamically stable.   Delay start of Pharmacological VTE agent (>24hrs) due to surgical blood loss or risk of bleeding: not applicable

## 2019-06-18 NOTE — Anesthesia Postprocedure Evaluation (Signed)
Anesthesia Post Note  Patient: [name] Wright  Procedure(s) Performed: DILATATION & CURETTAGE/HYSTEROSCOPY WITH  MYOSURE (N/A Uterus)     Patient location during evaluation: PACU Anesthesia Type: General Level of consciousness: awake and alert Pain management: pain level controlled Vital Signs Assessment: post-procedure vital signs reviewed and stable Respiratory status: spontaneous breathing, nonlabored ventilation, respiratory function stable and patient connected to nasal cannula oxygen Cardiovascular status: blood pressure returned to baseline and stable Postop Assessment: no apparent nausea or vomiting Anesthetic complications: no    Last Vitals:  Vitals:   06/18/19 0925 06/18/19 0930  BP:    Pulse: 87 87  Resp: 13 16  Temp: 36.6 C   SpO2: 92% 95%    Last Pain:  Vitals:   06/18/19 0930  TempSrc:   PainSc: 5                  Braeden Kennan COKER

## 2019-06-19 LAB — SURGICAL PATHOLOGY

## 2019-07-07 ENCOUNTER — Encounter: Payer: Self-pay | Admitting: Internal Medicine

## 2019-07-09 ENCOUNTER — Other Ambulatory Visit: Payer: Self-pay

## 2019-07-09 ENCOUNTER — Ambulatory Visit (INDEPENDENT_AMBULATORY_CARE_PROVIDER_SITE_OTHER): Payer: Medicare Other | Admitting: Internal Medicine

## 2019-07-09 VITALS — BP 138/86 | HR 83 | Temp 97.6°F | Ht 66.0 in | Wt 379.0 lb

## 2019-07-09 DIAGNOSIS — R5383 Other fatigue: Secondary | ICD-10-CM

## 2019-07-09 DIAGNOSIS — N924 Excessive bleeding in the premenopausal period: Secondary | ICD-10-CM

## 2019-07-09 DIAGNOSIS — E039 Hypothyroidism, unspecified: Secondary | ICD-10-CM

## 2019-07-09 DIAGNOSIS — E559 Vitamin D deficiency, unspecified: Secondary | ICD-10-CM | POA: Diagnosis not present

## 2019-07-09 NOTE — Progress Notes (Signed)
This visit occurred during the SARS-CoV-2 public health emergency.  Safety protocols were in place, including screening questions prior to the visit, additional usage of staff PPE, and extensive cleaning of exam room while observing appropriate contact time as indicated for disinfecting solutions.  Subjective:     Patient ID: [name] Wright , female    DOB: Sep 02, 1965 , 54 y.o.   MRN: [id number]   Chief Complaint  Patient presents with  . Fatigue    would like to have blood levels checked    HPI Would like to have labs done to check for anemia since she has been having a lot of menstrual bleeding in the past year, but worse in the past 3 months.  In 2020 had period from may to December and saw her GYN who placed her on meds to help her to stop. This was due to endometrial polyp and was removed 3/11 and had an IUD placed one week ago. She has been feeling fatigued no matter how much she sleeps.   Past Medical History:  Diagnosis Date  . Acute cystitis   . Allergic rhinitis   . Childhood asthma    no problems as adult - no inhaler  . Chronic insomnia   . Chronic pain due to trauma 02/2001   closed head trauma - Followed by Dr [facility]  . Complication of anesthesia   . Dyspepsia   . Head trauma 02/24/2001   closed  . History of kidney stones    passed stone - no surgery required  . Hypothyroidism   . PONV (postoperative nausea and vomiting)   . Pre-diabetes   . SVD (spontaneous vaginal delivery)    fetal demise at 5 months     Family History  Problem Relation Age of Onset  . Cancer Maternal Grandmother        ovarion     Current Outpatient Medications:  .  albuterol (PROAIR HFA) 108 (90 Base) MCG/ACT inhaler, Inhale 2 puffs into the lungs every 4 (four) hours as needed for wheezing or shortness of breath., Disp: 1 Inhaler, Rfl: 1 .  baclofen (LIORESAL) 10 MG tablet, Take 5-10 mg by mouth every 8 (eight) hours as needed for muscle  spasms. , Disp: , Rfl:  .  Carbinoxamine Maleate (RYVENT) 6 MG TABS, Take 6 mg by mouth 2 (two) times daily. (Patient taking differently: Take 6 mg by mouth daily as needed (allergies). ), Disp: 180 tablet, Rfl: 0 .  cetirizine (ZYRTEC) 10 MG tablet, Take 10 mg by mouth daily., Disp: , Rfl:  .  diazepam (VALIUM) 10 MG tablet, Take 10 mg by mouth 4 (four) times daily as needed for anxiety. , Disp: , Rfl:  .  diphenhydrAMINE (BENADRYL ALLERGY) 25 mg capsule, Take 25 mg by mouth every 6 (six) hours as needed for allergies. , Disp: , Rfl:  .  ibuprofen (ADVIL,MOTRIN) 200 MG tablet, Take 800 mg by mouth every 6 (six) hours as needed for headache or moderate pain. , Disp: , Rfl:  .  levothyroxine (SYNTHROID) 88 MCG tablet, Take 1 tablet by mouth once daily (Patient taking differently: Take 88 mcg by mouth daily before breakfast. ), Disp: 60 tablet, Rfl: 0 .  magnesium oxide (MAG-OX) 400 MG tablet, Take 400 mg by mouth daily., Disp: , Rfl:  .  morphine (MSIR) 15 MG tablet, Take 15 mg by mouth 4 (four) times daily as needed for severe pain. , Disp: , Rfl:  .  Multiple Minerals-Vitamins (CALCIUM-MAGNESIUM-ZINC-D3 PO), Take 1 tablet by mouth in the morning, at noon, and at bedtime., Disp: , Rfl:  .  promethazine (PHENERGAN) 25 MG tablet, Take 1 tablet (25 mg total) by mouth every 6 (six) hours as needed for nausea or vomiting., Disp: 12 tablet, Rfl: 0 .  pyridoxine (B-6) 100 MG tablet, Take 100 mg by mouth daily., Disp: , Rfl:  .  SUDOGEST MAXIMUM STRENGTH 30 MG tablet, TAKE 1 TABLET BY MOUTH TWICE DAILY AS NEEDED FOR CONGESTION (Patient taking differently: Take 30 mg by mouth 2 (two) times daily as needed for congestion. ), Disp: 90 tablet, Rfl: 0 .  traMADol (ULTRAM) 50 MG tablet, Take 1 tablet (50 mg total) by mouth every 6 (six) hours as needed., Disp: 120 tablet, Rfl: 0 .  ferrous sulfate 325 (65 FE) MG tablet, Take 325 mg by mouth daily with breakfast., Disp: , Rfl:  .  thiamine 100 MG tablet, Take 100 mg  by mouth daily., Disp: , Rfl:    Allergies  Allergen Reactions  . Doxycycline Anaphylaxis, Diarrhea and Nausea And Vomiting    headache  . Oxycodone-Acetaminophen Hives and Nausea And Vomiting  . Prochlorperazine Edisylate Other (See Comments)    Hyper and jitteriness  . Amoxicillin Rash and Other (See Comments)    Has patient had a PCN reaction causing immediate rash, facial/tongue/throat swelling, SOB or lightheadedness with hypotension: Unknown Has patient had a PCN reaction causing severe rash involving mucus membranes or skin necrosis: Unknown Has patient had a PCN reaction that required hospitalization: No Has patient had a PCN reaction occurring within the last 10 years: Yes If all of the above answers are "NO", then may proceed with Cephalosporin use.   . Compazine Other (See Comments)    Hyper/jitters/blotches  . Eszopiclone Other (See Comments)    Metallic taste and became un effective   . Keppra [Levetiracetam] Other (See Comments)    hyperactive  . Latex   . Levetiracetam Other (See Comments)    Jitters/hyper  . Neurontin [Gabapentin] Nausea And Vomiting  . Propoxyphene Hives  . Trileptal [Oxcarbazepine] Other (See Comments)    REACTION: immune system suppressed  . Bactrim [Sulfamethoxazole-Trimethoprim] Rash  . Bupropion Rash  . Codeine Rash  . Hydrocodone-Acetaminophen Rash  . Moxifloxacin Swelling and Rash  . Propoxyphene N-Acetaminophen Nausea And Vomiting and Rash  . Vicodin [Hydrocodone-Acetaminophen] Rash     Review of Systems  + for fatigue, mild pelvic pain since the IUD placement, her allergies are mild, otherwise the rest of 10 point ROS is neg.  Today's Vitals   07/09/19 1503  BP: 138/86  Pulse: 83  Temp: 97.6 F (36.4 C)  TempSrc: Oral  SpO2: 96%  Weight: (!) 379 lb (171.9 kg)  Height: 5\' 6"  (1.676 m)   Body mass index is 61.17 kg/m.   Objective:  Physical Exam Vitals and nursing note reviewed.  Constitutional:      General: She is  not in acute distress.    Appearance: She is obese. She is not toxic-appearing.  HENT:     Head: Atraumatic.     Right Ear: External ear normal.     Left Ear: External ear normal.  Eyes:     General: No scleral icterus.    Extraocular Movements: Extraocular movements intact.     Comments: Her conjunctiva's are pale  Cardiovascular:     Rate and Rhythm: Normal rate and regular rhythm.  Pulmonary:     Effort: Pulmonary effort is normal.  Breath sounds: Normal breath sounds.  Musculoskeletal:        General: Normal range of motion.     Cervical back: Neck supple.  Skin:    General: Skin is warm and dry.     Findings: No rash.  Neurological:     Mental Status: She is alert and oriented to person, place, and time.     Gait: Gait normal.  Psychiatric:        Mood and Affect: Mood normal.        Behavior: Behavior normal.        Thought Content: Thought content normal.        Judgment: Judgment normal.         Assessment And Plan:     1. Other fatigue-new - CBC no Diff - Iron, TIBC and Ferritin Panel - Vitamin K1, Serum - CMP14 + Anion Gap  2. Excessive bleeding in premenopausal period- will continue care with her GYN - CBC no Diff - Iron, TIBC and Ferritin Panel - Vitamin K1, Serum - CMP14 + Anion Gap  3. Hypothyroidism, unspecified type- chronic. Has been stable with her current med.  - TSH  4. Vitamin D deficiency- past hx of this. If low we will get her back on Rx vit D - Vitamin D 1,25 dihydroxy We will inform her via Mychart of her results.   Kresta Templeman RODRIGUEZ-SOUTHWORTH, PA-C    THE PATIENT IS ENCOURAGED TO PRACTICE SOCIAL DISTANCING DUE TO THE COVID-19 PANDEMIC.

## 2019-07-16 ENCOUNTER — Encounter: Payer: Self-pay | Admitting: Internal Medicine

## 2019-07-16 LAB — VITAMIN D 1,25 DIHYDROXY
Vitamin D 1, 25 (OH)2 Total: 35 pg/mL
Vitamin D2 1, 25 (OH)2: <10 pg/mL
Vitamin D3 1, 25 (OH)2: 35 pg/mL

## 2019-07-16 LAB — CMP14 + ANION GAP
ALT: 22 IU/L (ref 0–32)
AST: 23 IU/L (ref 0–40)
Albumin/Globulin Ratio: 1.3 (ref 1.2–2.2)
Albumin: 4.3 g/dL (ref 3.8–4.9)
Alkaline Phosphatase: 114 IU/L (ref 39–117)
Anion Gap: 15 mmol/L (ref 10.0–18.0)
BUN/Creatinine Ratio: 16 (ref 9–23)
BUN: 13 mg/dL (ref 6–24)
Bilirubin Total: 0.4 mg/dL (ref 0.0–1.2)
CO2: 25 mmol/L (ref 20–29)
Calcium: 9.6 mg/dL (ref 8.7–10.2)
Chloride: 96 mmol/L (ref 96–106)
Creatinine, Ser: 0.81 mg/dL (ref 0.57–1.00)
GFR calc Af Amer: 97 mL/min/{1.73_m2} (ref >59)
GFR calc non Af Amer: 84 mL/min/{1.73_m2} (ref >59)
Globulin, Total: 3.4 g/dL (ref 1.5–4.5)
Glucose: 150 mg/dL — ABNORMAL HIGH (ref 65–99)
Potassium: 4.2 mmol/L (ref 3.5–5.2)
Sodium: 136 mmol/L (ref 134–144)
Total Protein: 7.7 g/dL (ref 6.0–8.5)

## 2019-07-16 LAB — CBC
Hematocrit: 40.8 % (ref 34.0–46.6)
Hemoglobin: 13.5 g/dL (ref 11.1–15.9)
MCH: 28.6 pg (ref 26.6–33.0)
MCHC: 33.1 g/dL (ref 31.5–35.7)
MCV: 86 fL (ref 79–97)
Platelets: 431 10*3/uL (ref 150–450)
RBC: 4.72 x10E6/uL (ref 3.77–5.28)
RDW: 11.8 % (ref 11.7–15.4)
WBC: 9.2 10*3/uL (ref 3.4–10.8)

## 2019-07-16 LAB — IRON,TIBC AND FERRITIN PANEL
Ferritin: 29 ng/mL (ref 15–150)
Iron Saturation: 21 % (ref 15–55)
Iron: 79 ug/dL (ref 27–159)
Total Iron Binding Capacity: 384 ug/dL (ref 250–450)
UIBC: 305 ug/dL (ref 131–425)

## 2019-07-16 LAB — TSH: TSH: 1.67 u[IU]/mL (ref 0.450–4.500)

## 2019-07-17 ENCOUNTER — Other Ambulatory Visit: Payer: Self-pay | Admitting: Nurse Practitioner

## 2019-07-23 ENCOUNTER — Other Ambulatory Visit: Payer: Self-pay | Admitting: Internal Medicine

## 2019-07-23 DIAGNOSIS — T50901A Poisoning by unspecified drugs, medicaments and biological substances, accidental (unintentional), initial encounter: Secondary | ICD-10-CM

## 2019-07-23 NOTE — Telephone Encounter (Signed)
-----   Message from Hassell Done, [place] sent at 07/20/2019  5:58 PM EDT ----- [name] Wright w/pt gave her provider message  Pt stated she has been taking her husband thyroid medication she was unaware but wanted you to know she has been feeling great since taking them

## 2019-07-24 LAB — VITAMIN K1, SERUM: VITAMIN K1: 0.22 ng/mL (ref 0.13–1.88)

## 2019-07-27 ENCOUNTER — Other Ambulatory Visit: Payer: Self-pay

## 2019-07-27 ENCOUNTER — Other Ambulatory Visit: Payer: Medicare Other

## 2019-07-27 DIAGNOSIS — E039 Hypothyroidism, unspecified: Secondary | ICD-10-CM | POA: Diagnosis not present

## 2019-07-27 DIAGNOSIS — T50901A Poisoning by unspecified drugs, medicaments and biological substances, accidental (unintentional), initial encounter: Secondary | ICD-10-CM | POA: Diagnosis not present

## 2019-07-28 LAB — T3, FREE: T3, Free: 2.4 pg/mL (ref 2.0–4.4)

## 2019-07-28 LAB — TSH: TSH: 1.33 u[IU]/mL (ref 0.450–4.500)

## 2019-07-28 LAB — T4, FREE: Free T4: 1.16 ng/dL (ref 0.82–1.77)

## 2019-08-03 ENCOUNTER — Other Ambulatory Visit: Payer: Self-pay | Admitting: Nurse Practitioner

## 2019-08-07 ENCOUNTER — Other Ambulatory Visit: Payer: Self-pay | Admitting: Internal Medicine

## 2019-08-13 ENCOUNTER — Ambulatory Visit (INDEPENDENT_AMBULATORY_CARE_PROVIDER_SITE_OTHER): Payer: Medicare Other

## 2019-08-13 ENCOUNTER — Encounter: Payer: Self-pay | Admitting: Internal Medicine

## 2019-08-13 ENCOUNTER — Other Ambulatory Visit: Payer: Self-pay

## 2019-08-13 ENCOUNTER — Ambulatory Visit (INDEPENDENT_AMBULATORY_CARE_PROVIDER_SITE_OTHER): Payer: Medicare Other | Admitting: Internal Medicine

## 2019-08-13 VITALS — BP 122/76 | HR 79 | Temp 98.3°F | Ht 66.0 in | Wt 384.0 lb

## 2019-08-13 VITALS — BP 122/76 | HR 79 | Temp 98.3°F | Ht 66.0 in | Wt 384.4 lb

## 2019-08-13 DIAGNOSIS — Z23 Encounter for immunization: Secondary | ICD-10-CM

## 2019-08-13 DIAGNOSIS — E039 Hypothyroidism, unspecified: Secondary | ICD-10-CM | POA: Diagnosis not present

## 2019-08-13 DIAGNOSIS — Z6841 Body Mass Index (BMI) 40.0 and over, adult: Secondary | ICD-10-CM | POA: Diagnosis not present

## 2019-08-13 DIAGNOSIS — Z0000 Encounter for general adult medical examination without abnormal findings: Secondary | ICD-10-CM | POA: Diagnosis not present

## 2019-08-13 DIAGNOSIS — N921 Excessive and frequent menstruation with irregular cycle: Secondary | ICD-10-CM | POA: Diagnosis not present

## 2019-08-13 DIAGNOSIS — E66813 Obesity, class 3: Secondary | ICD-10-CM | POA: Insufficient documentation

## 2019-08-13 MED ORDER — TETANUS-DIPHTH-ACELL PERTUSSIS 5-2-15.5 LF-MCG/0.5 IM SUSP: 0.5000 mL | Freq: Once | INTRAMUSCULAR | 0 refills | Status: AC

## 2019-08-13 MED ORDER — LEVOTHYROXINE SODIUM 100 MCG PO TABS: 100.0000 ug | ORAL_TABLET | Freq: Every day | ORAL | 0 refills | Status: DC

## 2019-08-13 NOTE — Patient Instructions (Signed)
[name] Wright , Thank you for taking time to come for your Medicare Wellness Visit. I appreciate your ongoing commitment to your health goals. Please review the following plan we discussed and let me know if I can assist you in the future.   Screening recommendations/referrals: Colonoscopy: postponed til after bleeding episode Mammogram: postponed til after bleeding episode Bone Density: n/a Recommended yearly ophthalmology/optometry visit for glaucoma screening and checkup Recommended yearly dental visit for hygiene and checkup  Vaccinations: Influenza vaccine: decline Pneumococcal vaccine: decline Tdap vaccine: sent to pharmacy Shingles vaccine: discussed    Advanced directives: Advance directive discussed with you today. Even though you declined this today please call our office should you change your mind and we can give you the proper paperwork for you to fill out.  Conditions/risks identified: obesity  Next appointment: 10/07/2019 at 3:30  Preventive Care 40-64 Years, Female Preventive care refers to lifestyle choices and visits with your health care provider that can promote health and wellness. What does preventive care include?  A yearly physical exam. This is also called an annual well check.  Dental exams once or twice a year.  Routine eye exams. Ask your health care provider how often you should have your eyes checked.  Personal lifestyle choices, including:  Daily care of your teeth and gums.  Regular physical activity.  Eating a healthy diet.  Avoiding tobacco and drug use.  Limiting alcohol use.  Practicing safe sex.  Taking low-dose aspirin daily starting at age 11.  Taking vitamin and mineral supplements as recommended by your health care provider. What happens during an annual well check? The services and screenings done by your health care provider during your annual well check will depend on your age, overall health, lifestyle risk factors, and  family history of disease. Counseling  Your health care provider may ask you questions about your:  Alcohol use.  Tobacco use.  Drug use.  Emotional well-being.  Home and relationship well-being.  Sexual activity.  Eating habits.  Work and work Statistician.  Method of birth control.  Menstrual cycle.  Pregnancy history. Screening  You may have the following tests or measurements:  Height, weight, and BMI.  Blood pressure.  Lipid and cholesterol levels. These may be checked every 5 years, or more frequently if you are over 29 years old.  Skin check.  Lung cancer screening. You may have this screening every year starting at age 73 if you have a 30-pack-year history of smoking and currently smoke or have quit within the past 15 years.  Fecal occult blood test (FOBT) of the stool. You may have this test every year starting at age 50.  Flexible sigmoidoscopy or colonoscopy. You may have a sigmoidoscopy every 5 years or a colonoscopy every 10 years starting at age 65.  Hepatitis C blood test.  Hepatitis B blood test.  Sexually transmitted disease (STD) testing.  Diabetes screening. This is done by checking your blood sugar (glucose) after you have not eaten for a while (fasting). You may have this done every 1-3 years.  Mammogram. This may be done every 1-2 years. Talk to your health care provider about when you should start having regular mammograms. This may depend on whether you have a family history of breast cancer.  BRCA-related cancer screening. This may be done if you have a family history of breast, ovarian, tubal, or peritoneal cancers.  Pelvic exam and Pap test. This may be done every 3 years starting at age 61. Starting at  age 34, this may be done every 5 years if you have a Pap test in combination with an HPV test.  Bone density scan. This is done to screen for osteoporosis. You may have this scan if you are at high risk for osteoporosis. Discuss your  test results, treatment options, and if necessary, the need for more tests with your health care provider. Vaccines  Your health care provider may recommend certain vaccines, such as:  Influenza vaccine. This is recommended every year.  Tetanus, diphtheria, and acellular pertussis (Tdap, Td) vaccine. You may need a Td booster every 10 years.  Zoster vaccine. You may need this after age 79.  Pneumococcal 13-valent conjugate (PCV13) vaccine. You may need this if you have certain conditions and were not previously vaccinated.  Pneumococcal polysaccharide (PPSV23) vaccine. You may need one or two doses if you smoke cigarettes or if you have certain conditions. Talk to your health care provider about which screenings and vaccines you need and how often you need them. This information is not intended to replace advice given to you by your health care provider. Make sure you discuss any questions you have with your health care provider. Document Released: 04/22/2015 Document Revised: 12/14/2015 Document Reviewed: 01/25/2015 Elsevier Interactive Patient Education  2017 [employer] Prevention in the Home Falls can cause injuries. They can happen to people of all ages. There are many things you can do to make your home safe and to help prevent falls. What can I do on the outside of my home?  Regularly fix the edges of walkways and driveways and fix any cracks.  Remove anything that might make you trip as you walk through a door, such as a raised step or threshold.  Trim any bushes or trees on the path to your home.  Use bright outdoor lighting.  Clear any walking paths of anything that might make someone trip, such as rocks or tools.  Regularly check to see if handrails are loose or broken. Make sure that both sides of any steps have handrails.  Any raised decks and porches should have guardrails on the edges.  Have any leaves, snow, or ice cleared regularly.  Use sand or  salt on walking paths during winter.  Clean up any spills in your garage right away. This includes oil or grease spills. What can I do in the bathroom?  Use night lights.  Install grab bars by the toilet and in the tub and shower. Do not use towel bars as grab bars.  Use non-skid mats or decals in the tub or shower.  If you need to sit down in the shower, use a plastic, non-slip stool.  Keep the floor dry. Clean up any water that spills on the floor as soon as it happens.  Remove soap buildup in the tub or shower regularly.  Attach bath mats securely with double-sided non-slip rug tape.  Do not have throw rugs and other things on the floor that can make you trip. What can I do in the bedroom?  Use night lights.  Make sure that you have a light by your bed that is easy to reach.  Do not use any sheets or blankets that are too big for your bed. They should not hang down onto the floor.  Have a firm chair that has side arms. You can use this for support while you get dressed.  Do not have throw rugs and other things on the floor that  can make you trip. What can I do in the kitchen?  Clean up any spills right away.  Avoid walking on wet floors.  Keep items that you use a lot in easy-to-reach places.  If you need to reach something above you, use a strong step stool that has a grab bar.  Keep electrical cords out of the way.  Do not use floor polish or wax that makes floors slippery. If you must use wax, use non-skid floor wax.  Do not have throw rugs and other things on the floor that can make you trip. What can I do with my stairs?  Do not leave any items on the stairs.  Make sure that there are handrails on both sides of the stairs and use them. Fix handrails that are broken or loose. Make sure that handrails are as long as the stairways.  Check any carpeting to make sure that it is firmly attached to the stairs. Fix any carpet that is loose or worn.  Avoid having  throw rugs at the top or bottom of the stairs. If you do have throw rugs, attach them to the floor with carpet tape.  Make sure that you have a light switch at the top of the stairs and the bottom of the stairs. If you do not have them, ask someone to add them for you. What else can I do to help prevent falls?  Wear shoes that:  Do not have high heels.  Have rubber bottoms.  Are comfortable and fit you well.  Are closed at the toe. Do not wear sandals.  If you use a stepladder:  Make sure that it is fully opened. Do not climb a closed stepladder.  Make sure that both sides of the stepladder are locked into place.  Ask someone to hold it for you, if possible.  Clearly mark and make sure that you can see:  Any grab bars or handrails.  First and last steps.  Where the edge of each step is.  Use tools that help you move around (mobility aids) if they are needed. These include:  Canes.  Walkers.  Scooters.  Crutches.  Turn on the lights when you go into a dark area. Replace any light bulbs as soon as they burn out.  Set up your furniture so you have a clear path. Avoid moving your furniture around.  If any of your floors are uneven, fix them.  If there are any pets around you, be aware of where they are.  Review your medicines with your doctor. Some medicines can make you feel dizzy. This can increase your chance of falling. Ask your doctor what other things that you can do to help prevent falls. This information is not intended to replace advice given to you by your health care provider. Make sure you discuss any questions you have with your health care provider. Document Released: 01/20/2009 Document Revised: 09/01/2015 Document Reviewed: 04/30/2014 Elsevier Interactive Patient Education  2017 [employer].

## 2019-08-13 NOTE — Progress Notes (Signed)
This visit occurred during the SARS-CoV-2 public health emergency.  Safety protocols were in place, including screening questions prior to the visit, additional usage of staff PPE, and extensive cleaning of exam room while observing appropriate contact time as indicated for disinfecting solutions.  Subjective:   [name] Wright is a 54 y.o. female who presents for Medicare Annual (Subsequent) preventive examination.  Review of Systems:  n/a Cardiac Risk Factors include: obesity (BMI >30kg/m2);sedentary lifestyle     Objective:     Vitals: BP 122/76 (BP Location: Left Wrist, Patient Position: Sitting, Cuff Size: Normal)   Pulse 79   Temp 98.3 F (36.8 C) (Oral)   Ht 5\' 6"  (1.676 m)   Wt (!) 384 lb (174.2 kg)   LMP 08/05/2019   BMI 61.98 kg/m   Body mass index is 61.98 kg/m.  Advanced Directives 08/13/2019 06/12/2019 08/12/2018 04/10/2017 10/19/2014  Does Patient Have a Medical Advance Directive? No No No No No  Would patient like information on creating a medical advance directive? No - Patient declined No - Patient declined - Yes (MAU/Ambulatory/Procedural Areas - Information given) -    Tobacco Social History   Tobacco Use  Smoking Status Never Smoker  Smokeless Tobacco Never Used     Counseling given: Not Answered   Clinical Intake:  Pre-visit preparation completed: Yes  Pain : No/denies pain     Nutritional Status: BMI > 30  Obese Nutritional Risks: Nausea/ vomitting/ diarrhea(little due to menses) Diabetes: No  How often do you need to have someone help you when you read instructions, pamphlets, or other written materials from your doctor or pharmacy?: 1 - Never What is the last grade level you completed in school?: 1st year graduate  Interpreter Needed?: No  Information entered by :: NAllen LPN  Past Medical History:  Diagnosis Date  . Acute cystitis   . Allergic rhinitis   . Childhood asthma    no problems as adult - no inhaler  . Chronic insomnia     . Chronic pain due to trauma 02/2001   closed head trauma - Followed by Dr [facility]  . Complication of anesthesia   . Dyspepsia   . Head trauma 02/24/2001   closed  . History of kidney stones    passed stone - no surgery required  . Hypothyroidism   . PONV (postoperative nausea and vomiting)   . Pre-diabetes   . SVD (spontaneous vaginal delivery)    fetal demise at 5 months   Past Surgical History:  Procedure Laterality Date  . APPENDECTOMY    . DILATATION & CURETTAGE/HYSTEROSCOPY WITH MYOSURE N/A 04/15/2017   Procedure: DILATATION & CURETTAGE/HYSTEROSCOPY WITH MYOSURE POLYPECTOMY;  Surgeon: Everlene Farrier, MD;  Location: [facility];  Service: Gynecology;  Laterality: N/A;  . DILATATION & CURETTAGE/HYSTEROSCOPY WITH MYOSURE N/A 06/18/2019   Procedure: DILATATION & CURETTAGE/HYSTEROSCOPY WITH  MYOSURE;  Surgeon: Everlene Farrier, MD;  Location: [facility];  Service: Gynecology;  Laterality: N/A;  . EYE SURGERY     cataracts removed  . INCONTINENCE SURGERY    . PILONIDAL CYST EXCISION     x2  . repair of toe laceration     dermabond to right big toe right foot  . repair of torn ligament right leg     patient denies this surgery  . right foot fracture     cast only  . TONSILLECTOMY    . WISDOM TOOTH EXTRACTION     Family History  Problem Relation Age  of Onset  . Heart Problems Father   . Cancer Maternal Grandmother        ovarion  . Multiple sclerosis Mother    Social History   Socioeconomic History  . Marital status: Married    Spouse name: Not on file  . Number of children: Not on file  . Years of education: Not on file  . Highest education level: Not on file  Occupational History  . Occupation: disability  Tobacco Use  . Smoking status: Never Smoker  . Smokeless tobacco: Never Used  Substance and Sexual Activity  . Alcohol use: No    Comment: "1 drink/year"  . Drug use: Yes    Types: Morphine    Comment: Morphine 15 mg tab 4xdaily prn   . Sexual activity: Not Currently    Birth control/protection: None  Other Topics Concern  . 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 Difficulty of Paying Living Expenses: Not hard at all  Food Insecurity: No Food Insecurity  . Worried About Charity fundraiser in the Last Year: Never true  . Ran Out of Food in the Last Year: Never true  Transportation Needs: No Transportation Needs  . Lack of Transportation (Medical): No  . Lack of Transportation (Non-Medical): No  Physical Activity: Inactive  . Days of Exercise per Week: 0 days  . Minutes of Exercise per Session: 0 min  Stress: No Stress Concern Present  . Feeling of Stress : Only a little  Social Connections:   . Frequency of Communication with Friends and Family:   . Frequency of Social Gatherings with Friends and Family:   . Attends Religious Services:   . Active Member of Clubs or Organizations:   . Attends Archivist Meetings:   Marland Kitchen Marital Status:     Outpatient Encounter Medications as of 08/13/2019  Medication Sig  . albuterol (PROAIR HFA) 108 (90 Base) MCG/ACT inhaler Inhale 2 puffs into the lungs every 4 (four) hours as needed for wheezing or shortness of breath.  . baclofen (LIORESAL) 10 MG tablet Take 5-10 mg by mouth every 8 (eight) hours as needed for muscle spasms.   . cetirizine (ZYRTEC) 10 MG tablet Take 10 mg by mouth daily.  . diazepam (VALIUM) 10 MG tablet Take 10 mg by mouth 4 (four) times daily as needed for anxiety.   . diphenhydrAMINE (BENADRYL ALLERGY) 25 mg capsule Take 25 mg by mouth every 6 (six) hours as needed for allergies.   . ferrous sulfate 325 (65 FE) MG tablet Take 325 mg by mouth daily with breakfast.  . ibuprofen (ADVIL,MOTRIN) 200 MG tablet  Take 800 mg by mouth every 6 (six) hours as needed for headache or moderate pain.   Marland Kitchen levonorgestrel (MIRENA, 52 MG,) 20 MCG/24HR IUD Mirena 20 mcg/24 hours (6 yrs) 52 mg intrauterine device  Take 1 device by intrauterine route.  . magnesium oxide (MAG-OX) 400 MG tablet Take 400 mg by mouth daily.  Marland Kitchen morphine (MSIR) 15 MG tablet Take 15 mg by mouth 4 (four) times daily as needed for severe pain.   . Multiple Minerals-Vitamins (CALCIUM-MAGNESIUM-ZINC-D3 PO) Take 1 tablet by mouth in the morning, at noon, and at bedtime.  . progesterone (  PROMETRIUM) 200 MG capsule TAKE 1 CAPSULE BY MOUTH ONCE DAILY IN THE EVENING FOR 10 DAYS  . promethazine (PHENERGAN) 25 MG tablet Take 1 tablet (25 mg total) by mouth every 6 (six) hours as needed for nausea or vomiting.  Marland Kitchen RYVENT 6 MG TABS Take 1 tablet by mouth twice daily  . SUDOGEST 30 MG tablet TAKE 1 TABLET BY MOUTH TWICE DAILY AS NEEDED FOR CONGESTION  . Tdap (ADACEL) 08-08-13.5 LF-MCG/0.5 injection Inject 0.5 mLs into the muscle once for 1 dose.  . thiamine 100 MG tablet Take 100 mg by mouth daily.  . traMADol (ULTRAM) 50 MG tablet Take 1 tablet (50 mg total) by mouth every 6 (six) hours as needed.  . [DISCONTINUED] levothyroxine (SYNTHROID) 88 MCG tablet Take 1 tablet (88 mcg total) by mouth daily before breakfast. (Patient taking differently: Take 112 mcg by mouth daily before breakfast. )   No facility-administered encounter medications on file as of 08/13/2019.    Activities of Daily Living In your present state of health, do you have any difficulty performing the following activities: 08/13/2019 06/12/2019  Hearing? N N  Vision? N N  Difficulty concentrating or making decisions? N Y  Comment - memory loss due to head injury  Walking or climbing stairs? Y N  Dressing or bathing? N N  Doing errands, shopping? N N  Preparing Food and eating ? N -  Using the Toilet? N -  In the past six months, have you accidently leaked urine? Y -  Comment has a mesh  implant -  Do you have problems with loss of bowel control? N -  Managing your Medications? N -  Managing your Finances? N -  Housekeeping or managing your Housekeeping? N -  Some recent data might be hidden    Patient Care Team: [name], MD as PCP - General (Internal Medicine) Biagio Borg, MD (Internal Medicine)    Assessment:   This is a routine wellness examination for [place].  Exercise Activities and Dietary recommendations Current Exercise Habits: The patient does not participate in regular exercise at present  Goals    . Weight (lb) < 200 lb (90.7 kg) (pt-stated)    . Weight (lb) < 200 lb (90.7 kg)     08/13/2019, wants to move more and lose weight (wants to get to 200 pounds)       Fall Risk Fall Risk  08/13/2019 03/10/2019 01/22/2019 08/12/2018 05/06/2018  Falls in the past year? 0 0 0 0 0  Risk for fall due to : Medication side effect - - Medication side effect -  Follow up Falls evaluation completed;Education provided;Falls prevention discussed - - Falls prevention discussed -   Is the patient's home free of loose throw rugs in walkways, pet beds, electrical cords, etc?   yes      Grab bars in the bathroom? no      Handrails on the stairs?   yes      Adequate lighting?   yes  Timed Get Up and Go performed: n/a  Depression Screen PHQ 2/9 Scores 08/13/2019 03/10/2019 08/12/2018 05/06/2018  PHQ - 2 Score 0 0 0 0  PHQ- 9 Score 0 - 0 -     Cognitive Function     6CIT Screen 08/13/2019 08/12/2018  What Year? 0 points 0 points  What month? 0 points 0 points  What time? 0 points 0 points  Count back from 20 2 points 0 points  Months in reverse 0 points  0 points  Repeat phrase 0 points 0 points  Total Score 2 0    Immunization History  Administered Date(s) Administered  . Td 09/26/2001    Qualifies for Shingles Vaccine? yes  Screening Tests Health Maintenance  Topic Date Due  . TETANUS/TDAP  09/27/2011  . PAP SMEAR-Modifier  06/07/2013  . COVID-19  Vaccine (1) 08/29/2019 (Originally 01/06/1983)  . MAMMOGRAM  08/12/2020 (Originally 01/05/2017)  . COLONOSCOPY  08/12/2020 (Originally 01/05/2017)  . HIV Screening  08/12/2020 (Originally 01/05/1982)  . INFLUENZA VACCINE  11/08/2019    Cancer Screenings: Lung: Low Dose CT Chest recommended if Age 75-80 years, 30 pack-year currently smoking OR have quit w/in 15years. Patient does not qualify. Breast:  Up to date on Mammogram? No   Up to date of Bone Density/Dexa? n/a Colorectal: postponed til after bleeding episode  Additional Screenings: : Hepatitis C Screening: n/a     Plan:    Patient wants to move more and get down to 200 pounds. Mammogram and colorectal screening on hold until bleeding episode is under control.   I have personally reviewed and noted the following in the patient's chart:   . Medical and social history . Use of alcohol, tobacco or illicit drugs  . Current medications and supplements . Functional ability and status . Nutritional status . Physical activity . Advanced directives . List of other physicians . Hospitalizations, surgeries, and ER visits in previous 12 months . Vitals . Screenings to include cognitive, depression, and falls . Referrals and appointments  In addition, I have reviewed and discussed with patient certain preventive protocols, quality metrics, and best practice recommendations. A written personalized care plan for preventive services as well as general preventive health recommendations were provided to patient.     [name], LPN  [id number]

## 2019-08-13 NOTE — Patient Instructions (Signed)
Hypothyroidism  Hypothyroidism is when the thyroid gland does not make enough of certain hormones (it is underactive). The thyroid gland is a small gland located in the lower front part of the neck, just in front of the windpipe (trachea). This gland makes hormones that help control how the body uses food for energy (metabolism) as well as how the heart and brain function. These hormones also play a role in keeping your bones strong. When the thyroid is underactive, it produces too little of the hormones thyroxine (T4) and triiodothyronine (T3). What are the causes? This condition may be caused by:  Hashimoto's disease. This is a disease in which the body's disease-fighting system (immune system) attacks the thyroid gland. This is the most common cause.  Viral infections.  Pregnancy.  Certain medicines.  Birth defects.  Past radiation treatments to the head or neck for cancer.  Past treatment with radioactive iodine.  Past exposure to radiation in the environment.  Past surgical removal of part or all of the thyroid.  Problems with a gland in the center of the brain (pituitary gland).  Lack of enough iodine in the diet. What increases the risk? You are more likely to develop this condition if:  You are female.  You have a family history of thyroid conditions.  You use a medicine called lithium.  You take medicines that affect the immune system (immunosuppressants). What are the signs or symptoms? Symptoms of this condition include:  Feeling as though you have no energy (lethargy).  Not being able to tolerate cold.  Weight gain that is not explained by a change in diet or exercise habits.  Lack of appetite.  Dry skin.  Coarse hair.  Menstrual irregularity.  Slowing of thought processes.  Constipation.  Sadness or depression. How is this diagnosed? This condition may be diagnosed based on:  Your symptoms, your medical history, and a physical exam.  Blood  tests. You may also have imaging tests, such as an ultrasound or MRI. How is this treated? This condition is treated with medicine that replaces the thyroid hormones that your body does not make. After you begin treatment, it may take several weeks for symptoms to go away. Follow these instructions at home:  Take over-the-counter and prescription medicines only as told by your health care provider.  If you start taking any new medicines, tell your health care provider.  Keep all follow-up visits as told by your health care provider. This is important. ? As your condition improves, your dosage of thyroid hormone medicine may change. ? You will need to have blood tests regularly so that your health care provider can monitor your condition. Contact a health care provider if:  Your symptoms do not get better with treatment.  You are taking thyroid replacement medicine and you: ? Sweat a lot. ? Have tremors. ? Feel anxious. ? Lose weight rapidly. ? Cannot tolerate heat. ? Have emotional swings. ? Have diarrhea. ? Feel weak. Get help right away if you have:  Chest pain.  An irregular heartbeat.  A rapid heartbeat.  Difficulty breathing. Summary  Hypothyroidism is when the thyroid gland does not make enough of certain hormones (it is underactive).  When the thyroid is underactive, it produces too little of the hormones thyroxine (T4) and triiodothyronine (T3).  The most common cause is Hashimoto's disease, a disease in which the body's disease-fighting system (immune system) attacks the thyroid gland. The condition can also be caused by viral infections, medicine, pregnancy, or past   radiation treatment to the head or neck.  Symptoms may include weight gain, dry skin, constipation, feeling as though you do not have energy, and not being able to tolerate cold.  This condition is treated with medicine to replace the thyroid hormones that your body does not make. This information  is not intended to replace advice given to you by your health care provider. Make sure you discuss any questions you have with your health care provider. Document Revised: 03/08/2017 Document Reviewed: 03/06/2017 Elsevier Patient Education  2020 [employer]  

## 2019-08-15 ENCOUNTER — Encounter: Payer: Self-pay | Admitting: Internal Medicine

## 2019-08-15 NOTE — Progress Notes (Signed)
This visit occurred during the SARS-CoV-2 public health emergency.  Safety protocols were in place, including screening questions prior to the visit, additional usage of staff PPE, and extensive cleaning of exam room while observing appropriate contact time as indicated for disinfecting solutions.  Subjective:     Patient ID: [name] Wright , female    DOB: 1965/05/03 , 54 y.o.   MRN: [id number]   Chief Complaint  Patient presents with  . Hypothyroidism    HPI  She is here today for a thyroid check. She had been taking 57mcg daily. Went out of town, forgot her meds, and took her husband's 155mcg daily. She reports she felt like "a new person". She wants to know if she can be switched to this dose.     Past Medical History:  Diagnosis Date  . Acute cystitis   . Allergic rhinitis   . Childhood asthma    no problems as adult - no inhaler  . Chronic insomnia   . Chronic pain due to trauma 02/2001   closed head trauma - Followed by [facility]  . Complication of anesthesia   . Dyspepsia   . Head trauma 02/24/2001   closed  . History of kidney stones    passed stone - no surgery required  . Hypothyroidism   . PONV (postoperative nausea and vomiting)   . Pre-diabetes   . SVD (spontaneous vaginal delivery)    fetal demise at 5 months     Family History  Problem Relation Age of Onset  . Heart Problems Father   . Cancer Maternal Grandmother        ovarion  . Multiple sclerosis Mother      Current Outpatient Medications:  .  albuterol (PROAIR HFA) 108 (90 Base) MCG/ACT inhaler, Inhale 2 puffs into the lungs every 4 (four) hours as needed for wheezing or shortness of breath., Disp: 1 Inhaler, Rfl: 1 .  baclofen (LIORESAL) 10 MG tablet, Take 5-10 mg by mouth every 8 (eight) hours as needed for muscle spasms. , Disp: , Rfl:  .  cetirizine (ZYRTEC) 10 MG tablet, Take 10 mg by mouth daily., Disp: , Rfl:  .  Cholecalciferol (VITAMIN D) 50 MCG  (2000 UT) CAPS, Take by mouth daily., Disp: , Rfl:  .  diazepam (VALIUM) 10 MG tablet, Take 10 mg by mouth 4 (four) times daily as needed for anxiety. , Disp: , Rfl:  .  diphenhydrAMINE (BENADRYL ALLERGY) 25 mg capsule, Take 25 mg by mouth every 6 (six) hours as needed for allergies. , Disp: , Rfl:  .  ferrous sulfate 325 (65 FE) MG tablet, Take 325 mg by mouth daily with breakfast., Disp: , Rfl:  .  ibuprofen (ADVIL,MOTRIN) 200 MG tablet, Take 800 mg by mouth every 6 (six) hours as needed for headache or moderate pain. , Disp: , Rfl:  .  levonorgestrel (MIRENA, 52 MG,) 20 MCG/24HR IUD, Mirena 20 mcg/24 hours (6 yrs) 52 mg intrauterine device  Take 1 device by intrauterine route., Disp: , Rfl:  .  magnesium oxide (MAG-OX) 400 MG tablet, Take 400 mg by mouth daily., Disp: , Rfl:  .  morphine (MSIR) 15 MG tablet, Take 15 mg by mouth 4 (four) times daily as needed for severe pain. , Disp: , Rfl:  .  Multiple Minerals-Vitamins (CALCIUM-MAGNESIUM-ZINC-D3 PO), Take 1 tablet by mouth in the morning, at noon, and at bedtime., Disp: , Rfl:  .  progesterone (PROMETRIUM) 200 MG capsule,  TAKE 1 CAPSULE BY MOUTH ONCE DAILY IN THE EVENING FOR 10 DAYS, Disp: , Rfl:  .  promethazine (PHENERGAN) 25 MG tablet, Take 1 tablet (25 mg total) by mouth every 6 (six) hours as needed for nausea or vomiting., Disp: 12 tablet, Rfl: 0 .  RYVENT 6 MG TABS, Take 1 tablet by mouth twice daily, Disp: 30 tablet, Rfl: 0 .  SUDOGEST 30 MG tablet, TAKE 1 TABLET BY MOUTH TWICE DAILY AS NEEDED FOR CONGESTION, Disp: 90 tablet, Rfl: 0 .  traMADol (ULTRAM) 50 MG tablet, Take 1 tablet (50 mg total) by mouth every 6 (six) hours as needed., Disp: 120 tablet, Rfl: 0 .  levothyroxine (SYNTHROID) 100 MCG tablet, Take 1 tablet (100 mcg total) by mouth daily before breakfast., Disp: 90 tablet, Rfl: 0 .  thiamine 100 MG tablet, Take 100 mg by mouth daily., Disp: , Rfl:    Allergies  Allergen Reactions  . Doxycycline Anaphylaxis, Diarrhea and  Nausea And Vomiting    headache  . Oxycodone-Acetaminophen Hives and Nausea And Vomiting  . Prochlorperazine Edisylate Other (See Comments)    Hyper and jitteriness  . Amoxicillin Rash and Other (See Comments)    Has patient had a PCN reaction causing immediate rash, facial/tongue/throat swelling, SOB or lightheadedness with hypotension: Unknown Has patient had a PCN reaction causing severe rash involving mucus membranes or skin necrosis: Unknown Has patient had a PCN reaction that required hospitalization: No Has patient had a PCN reaction occurring within the last 10 years: Yes If all of the above answers are "NO", then may proceed with Cephalosporin use.   . Compazine Other (See Comments)    Hyper/jitters/blotches  . Eszopiclone Other (See Comments)    Metallic taste and became un effective   . Keppra [Levetiracetam] Other (See Comments)    hyperactive  . Latex   . Levetiracetam Other (See Comments)    Jitters/hyper  . Neurontin [Gabapentin] Nausea And Vomiting  . Propoxyphene Hives  . Trileptal [Oxcarbazepine] Other (See Comments)    REACTION: immune system suppressed  . Bactrim [Sulfamethoxazole-Trimethoprim] Rash  . Bupropion Rash  . Codeine Rash  . Hydrocodone-Acetaminophen Rash  . Moxifloxacin Swelling and Rash  . Propoxyphene N-Acetaminophen Nausea And Vomiting and Rash  . Vicodin [Hydrocodone-Acetaminophen] Rash     Review of Systems  Constitutional: Negative.   Respiratory: Negative.   Cardiovascular: Negative.   Gastrointestinal: Negative.   Neurological: Negative.   Psychiatric/Behavioral: Negative.      Today's Vitals   08/13/19 0905  BP: 122/76  Pulse: 79  Temp: 98.3 F (36.8 C)  TempSrc: Oral  Weight: (!) 384 lb 6.4 oz (174.4 kg)  Height: 5\' 6"  (1.676 m)  PainSc: 0-No pain   Body mass index is 62.04 kg/m.   Objective:  Physical Exam Vitals and nursing note reviewed.  Constitutional:      Appearance: Normal appearance. She is obese.  HENT:      Head: Normocephalic and atraumatic.  Cardiovascular:     Rate and Rhythm: Normal rate and regular rhythm.     Heart sounds: Normal heart sounds.  Pulmonary:     Effort: Pulmonary effort is normal.     Breath sounds: Normal breath sounds.  Skin:    General: Skin is warm.  Neurological:     General: No focal deficit present.     Mental Status: She is alert.  Psychiatric:        Mood and Affect: Mood normal.        Behavior:  Behavior normal.         Assessment And Plan:     1. Hypothyroidism, unspecified type  Pt's previous lab results reviewed, I do not think she will tolerate 162mcg dose. She agrees to start 139mcg dose. She agrees to rto in 6-8 weeks for re-evaluation. She is advised to notify me should she develop palpitations, hot flashes, diarrhea, and worsening fatigue. She verbally acknowledges treatment plan and is in agreement. All questions were answered to her satisfaction.   2. Menorrhagia with irregular cycle  Chronic. Recently s/p endometrial polypectomy, hysteroscopy and dilatation and curettage. Pt advised that there is a chance these sx will improve with new levothyroxine dose.   3. Class 3 severe obesity due to excess calories with serious comorbidity and body mass index (BMI) of 60.0 to 69.9 in adult (HCC)  BMI 61. She is encouraged to incorporate more activity into her daily routine. Encouraged to work up to 30 minutes five days per week.   Maximino Greenland, MD    THE PATIENT IS ENCOURAGED TO PRACTICE SOCIAL DISTANCING DUE TO THE COVID-19 PANDEMIC.

## 2019-09-16 DIAGNOSIS — G43009 Migraine without aura, not intractable, without status migrainosus: Secondary | ICD-10-CM | POA: Diagnosis not present

## 2019-09-16 DIAGNOSIS — R519 Headache, unspecified: Secondary | ICD-10-CM | POA: Diagnosis not present

## 2019-09-16 DIAGNOSIS — G894 Chronic pain syndrome: Secondary | ICD-10-CM | POA: Diagnosis not present

## 2019-09-16 DIAGNOSIS — S0990XS Unspecified injury of head, sequela: Secondary | ICD-10-CM | POA: Diagnosis not present

## 2019-09-24 ENCOUNTER — Telehealth: Payer: Self-pay

## 2019-09-24 NOTE — Telephone Encounter (Signed)
Unable to leave vm. Need to know if pt has completed cologard

## 2019-10-02 ENCOUNTER — Encounter: Payer: Self-pay | Admitting: Nurse Practitioner

## 2019-10-02 DIAGNOSIS — H26493 Other secondary cataract, bilateral: Secondary | ICD-10-CM | POA: Diagnosis not present

## 2019-10-02 DIAGNOSIS — H0100A Unspecified blepharitis right eye, upper and lower eyelids: Secondary | ICD-10-CM | POA: Diagnosis not present

## 2019-10-02 DIAGNOSIS — H0100B Unspecified blepharitis left eye, upper and lower eyelids: Secondary | ICD-10-CM | POA: Diagnosis not present

## 2019-10-02 DIAGNOSIS — H16223 Keratoconjunctivitis sicca, not specified as Sjogren's, bilateral: Secondary | ICD-10-CM | POA: Diagnosis not present

## 2019-10-02 DIAGNOSIS — Z961 Presence of intraocular lens: Secondary | ICD-10-CM | POA: Diagnosis not present

## 2019-10-07 ENCOUNTER — Encounter: Payer: Self-pay | Admitting: Internal Medicine

## 2019-10-07 ENCOUNTER — Ambulatory Visit (INDEPENDENT_AMBULATORY_CARE_PROVIDER_SITE_OTHER): Payer: Medicare Other | Admitting: Internal Medicine

## 2019-10-07 ENCOUNTER — Other Ambulatory Visit: Payer: Self-pay

## 2019-10-07 VITALS — BP 130/86 | HR 99 | Temp 98.5°F | Ht 72.0 in | Wt 382.8 lb

## 2019-10-07 DIAGNOSIS — Z6841 Body Mass Index (BMI) 40.0 and over, adult: Secondary | ICD-10-CM

## 2019-10-07 DIAGNOSIS — Z23 Encounter for immunization: Secondary | ICD-10-CM

## 2019-10-07 DIAGNOSIS — E039 Hypothyroidism, unspecified: Secondary | ICD-10-CM

## 2019-10-07 DIAGNOSIS — J0101 Acute recurrent maxillary sinusitis: Secondary | ICD-10-CM

## 2019-10-07 DIAGNOSIS — L989 Disorder of the skin and subcutaneous tissue, unspecified: Secondary | ICD-10-CM | POA: Diagnosis not present

## 2019-10-07 DIAGNOSIS — Z1159 Encounter for screening for other viral diseases: Secondary | ICD-10-CM | POA: Diagnosis not present

## 2019-10-07 MED ORDER — LEVOCETIRIZINE DIHYDROCHLORIDE 5 MG PO TABS: 5.0000 mg | ORAL_TABLET | Freq: Every evening | ORAL | 2 refills | Status: DC

## 2019-10-07 MED ORDER — TETANUS-DIPHTH-ACELL PERTUSSIS 5-2.5-18.5 LF-MCG/0.5 IM SUSP: 0.5000 mL | Freq: Once | INTRAMUSCULAR | 0 refills | Status: AC

## 2019-10-08 ENCOUNTER — Encounter: Payer: Self-pay | Admitting: Internal Medicine

## 2019-10-08 LAB — TSH: TSH: 1.18 u[IU]/mL (ref 0.450–4.500)

## 2019-10-08 LAB — HEPATITIS C ANTIBODY: Hep C Virus Ab: <0.1 s/co ratio (ref 0.0–0.9)

## 2019-10-09 ENCOUNTER — Other Ambulatory Visit: Payer: Self-pay | Admitting: Internal Medicine

## 2019-10-09 DIAGNOSIS — R0981 Nasal congestion: Secondary | ICD-10-CM

## 2019-10-09 DIAGNOSIS — J3489 Other specified disorders of nose and nasal sinuses: Secondary | ICD-10-CM

## 2019-10-09 MED ORDER — AMOXICILLIN 875 MG PO TABS: 875.0000 mg | ORAL_TABLET | Freq: Two times a day (BID) | ORAL | 0 refills | Status: DC

## 2019-10-12 NOTE — Progress Notes (Signed)
This visit occurred during the SARS-CoV-2 public health emergency.  Safety protocols were in place, including screening questions prior to the visit, additional usage of staff PPE, and extensive cleaning of exam room while observing appropriate contact time as indicated for disinfecting solutions.  Subjective:     Patient ID: [name] Wright , female    DOB: Jun 28, 1965 , 54 y.o.   MRN: [id number]   Chief Complaint  Patient presents with  . Hypothyroidism    Retaininng fluid   . Sinusitis    mucus and clogged ears    HPI  She presents today for thyroid check. She reports compliance with meds. Her dose was changed at her last visit. She has not had any issues with the change in dose.   Sinusitis This is a recurrent problem. The current episode started 1 to 4 weeks ago. The problem is unchanged. There has been no fever. Her pain is at a severity of 7/10. Associated symptoms include congestion, ear pain, headaches and sinus pressure. Past treatments include acetaminophen. The treatment provided no relief.     Past Medical History:  Diagnosis Date  . Acute cystitis   . Allergic rhinitis   . Childhood asthma    no problems as adult - no inhaler  . Chronic insomnia   . Chronic pain due to trauma 02/2001   closed head trauma - Followed by [facility]  . Complication of anesthesia   . Dyspepsia   . Head trauma 02/24/2001   closed  . History of kidney stones    passed stone - no surgery required  . Hypothyroidism   . PONV (postoperative nausea and vomiting)   . Pre-diabetes   . SVD (spontaneous vaginal delivery)    fetal demise at 5 months     Family History  Problem Relation Age of Onset  . Heart Problems Father   . Cancer Maternal Grandmother        ovarion  . Multiple sclerosis Mother      Current Outpatient Medications:  .  albuterol (PROAIR HFA) 108 (90 Base) MCG/ACT inhaler, Inhale 2 puffs into the lungs every 4 (four) hours as  needed for wheezing or shortness of breath., Disp: 1 Inhaler, Rfl: 1 .  amoxicillin (AMOXIL) 875 MG tablet, Take 1 tablet (875 mg total) by mouth 2 (two) times daily., Disp: 20 tablet, Rfl: 0 .  baclofen (LIORESAL) 10 MG tablet, Take 5-10 mg by mouth every 8 (eight) hours as needed for muscle spasms. , Disp: , Rfl:  .  cetirizine (ZYRTEC) 10 MG tablet, Take 10 mg by mouth daily., Disp: , Rfl:  .  Cholecalciferol (VITAMIN D) 50 MCG (2000 UT) CAPS, Take by mouth daily., Disp: , Rfl:  .  diazepam (VALIUM) 10 MG tablet, Take 10 mg by mouth 4 (four) times daily as needed for anxiety. , Disp: , Rfl:  .  diphenhydrAMINE (BENADRYL ALLERGY) 25 mg capsule, Take 25 mg by mouth every 6 (six) hours as needed for allergies. , Disp: , Rfl:  .  ferrous sulfate 325 (65 FE) MG tablet, Take 325 mg by mouth daily with breakfast., Disp: , Rfl:  .  ibuprofen (ADVIL,MOTRIN) 200 MG tablet, Take 800 mg by mouth every 6 (six) hours as needed for headache or moderate pain. , Disp: , Rfl:  .  levocetirizine (XYZAL) 5 MG tablet, Take 1 tablet (5 mg total) by mouth every evening., Disp: 30 tablet, Rfl: 2 .  levonorgestrel (MIRENA, 52 MG,)  20 MCG/24HR IUD, Mirena 20 mcg/24 hours (6 yrs) 52 mg intrauterine device  Take 1 device by intrauterine route., Disp: , Rfl:  .  levothyroxine (SYNTHROID) 100 MCG tablet, Take 1 tablet (100 mcg total) by mouth daily before breakfast., Disp: 90 tablet, Rfl: 0 .  magnesium oxide (MAG-OX) 400 MG tablet, Take 400 mg by mouth daily., Disp: , Rfl:  .  morphine (MSIR) 15 MG tablet, Take 15 mg by mouth 4 (four) times daily as needed for severe pain. , Disp: , Rfl:  .  Multiple Minerals-Vitamins (CALCIUM-MAGNESIUM-ZINC-D3 PO), Take 1 tablet by mouth in the morning, at noon, and at bedtime., Disp: , Rfl:  .  progesterone (PROMETRIUM) 200 MG capsule, TAKE 1 CAPSULE BY MOUTH ONCE DAILY IN THE EVENING FOR 10 DAYS, Disp: , Rfl:  .  promethazine (PHENERGAN) 25 MG tablet, Take 1 tablet (25 mg total) by mouth  every 6 (six) hours as needed for nausea or vomiting., Disp: 12 tablet, Rfl: 0 .  RYVENT 6 MG TABS, Take 1 tablet by mouth twice daily, Disp: 30 tablet, Rfl: 0 .  SUDOGEST 30 MG tablet, TAKE 1 TABLET BY MOUTH TWICE DAILY AS NEEDED FOR CONGESTION, Disp: 90 tablet, Rfl: 0 .  thiamine 100 MG tablet, Take 100 mg by mouth daily., Disp: , Rfl:  .  traMADol (ULTRAM) 50 MG tablet, Take 1 tablet (50 mg total) by mouth every 6 (six) hours as needed., Disp: 120 tablet, Rfl: 0   Allergies  Allergen Reactions  . Doxycycline Anaphylaxis, Diarrhea and Nausea And Vomiting    headache  . Oxycodone-Acetaminophen Hives and Nausea And Vomiting  . Prochlorperazine Edisylate Other (See Comments)    Hyper and jitteriness  . Amoxicillin Rash and Other (See Comments)    Has patient had a PCN reaction causing immediate rash, facial/tongue/throat swelling, SOB or lightheadedness with hypotension: Unknown Has patient had a PCN reaction causing severe rash involving mucus membranes or skin necrosis: Unknown Has patient had a PCN reaction that required hospitalization: No Has patient had a PCN reaction occurring within the last 10 years: Yes If all of the above answers are "NO", then may proceed with Cephalosporin use.   . Compazine Other (See Comments)    Hyper/jitters/blotches  . Eszopiclone Other (See Comments)    Metallic taste and became un effective   . Keppra [Levetiracetam] Other (See Comments)    hyperactive  . Latex   . Levetiracetam Other (See Comments)    Jitters/hyper  . Neurontin [Gabapentin] Nausea And Vomiting  . Propoxyphene Hives  . Trileptal [Oxcarbazepine] Other (See Comments)    REACTION: immune system suppressed  . Bactrim [Sulfamethoxazole-Trimethoprim] Rash  . Bupropion Rash  . Codeine Rash  . Hydrocodone-Acetaminophen Rash  . Moxifloxacin Swelling and Rash  . Propoxyphene N-Acetaminophen Nausea And Vomiting and Rash  . Vicodin [Hydrocodone-Acetaminophen] Rash     Review of  Systems  Constitutional: Negative.   HENT: Positive for congestion, ear pain and sinus pressure.   Respiratory: Negative.   Cardiovascular: Negative.   Gastrointestinal: Negative.   Neurological: Positive for headaches.  Psychiatric/Behavioral: Negative.      Today's Vitals   10/07/19 1529  BP: 130/86  Pulse: 99  Temp: 98.5 F (36.9 C)  TempSrc: Oral  Weight: (!) 382 lb 12.8 oz (173.6 kg)  Height: 6' (1.829 m)  PainSc: 0-No pain   Body mass index is 51.92 kg/m.   Objective:  Physical Exam Vitals and nursing note reviewed.  Constitutional:      Appearance:  Normal appearance. She is obese.  HENT:     Head: Normocephalic and atraumatic.     Comments: B/l maxillary sinus tender to percussion    Right Ear: External ear normal. There is no impacted cerumen.     Left Ear: External ear normal. There is no impacted cerumen.  Cardiovascular:     Rate and Rhythm: Normal rate and regular rhythm.     Heart sounds: Normal heart sounds.  Pulmonary:     Effort: Pulmonary effort is normal.     Breath sounds: Normal breath sounds.  Skin:    General: Skin is warm.     Comments: Flesh-colored, scaly lesion underneath left eye.  Neurological:     General: No focal deficit present.     Mental Status: She is alert.  Psychiatric:        Mood and Affect: Mood normal.        Behavior: Behavior normal.         Assessment And Plan:     1. Hypothyroidism, unspecified type  I will check thyroid panel and adjust meds as needed.  - TSH  2. Acute recurrent maxillary sinusitis  I will refer her to ENT. She is encouraged to avoid dairy products. If her sx persist, will consider abx therapy.   - Ambulatory referral to ENT  3. Skin lesion  Pt advised that this is in sun- exposed area and should have further evaluation.  She agrees to [employer] evaluation.   - Ambulatory referral to Dermatology  4. Encounter for HCV screening test for low risk patient  - Hepatitis C antibody  5.  Immunization due  RX for Boostrix/Tdap was sent to her local pharmacy.   6. Class 3 severe obesity due to excess calories with serious comorbidity and body mass index (BMI) of 50.0 to 59.9 in adult [facility])  She has lost 2 pounds since her last visit six weeks ago.  She is encouraged to incorporate more exercise into her daily routine.   Maximino Greenland, MD    THE PATIENT IS ENCOURAGED TO PRACTICE SOCIAL DISTANCING DUE TO THE COVID-19 PANDEMIC.

## 2019-10-26 ENCOUNTER — Other Ambulatory Visit: Payer: Self-pay | Admitting: Nurse Practitioner

## 2019-10-28 ENCOUNTER — Ambulatory Visit (INDEPENDENT_AMBULATORY_CARE_PROVIDER_SITE_OTHER): Payer: Medicare Other | Admitting: Otolaryngology

## 2019-10-28 ENCOUNTER — Encounter (INDEPENDENT_AMBULATORY_CARE_PROVIDER_SITE_OTHER): Payer: Self-pay | Admitting: Otolaryngology

## 2019-10-28 ENCOUNTER — Other Ambulatory Visit: Payer: Self-pay

## 2019-10-28 VITALS — Temp 96.8°F

## 2019-10-28 DIAGNOSIS — J310 Chronic rhinitis: Secondary | ICD-10-CM

## 2019-10-28 NOTE — Progress Notes (Signed)
HPI: [name] Wright is a 54 y.o. female who presents is referred by her PCP Dr. Baird Cancer for evaluation of recurrent sinus infections.  She had previously been followed by Dr. Ernesto Rutherford.  Apparently most recently started with a viral cold that resulted in a sinus infection with thick yellow mucus discharge.  She was treated with a round of antibiotic. She has been told that she has a polyp in the left sinus. She also has history of bad allergies and uses Xyzal on a as needed basis.  She is presently not using nasal steroid spray. I reviewed CT scans that she had of her sinuses performed in 2012 and 2017 this showed a left maxillary sinus retention cyst with clear paranasal sinuses otherwise.  There is no mucoperiosteal thickening no air-fluid levels and no opacification of the sinuses.  .  Past Medical History:  Diagnosis Date  . Acute cystitis   . Allergic rhinitis   . Childhood asthma    no problems as adult - no inhaler  . Chronic insomnia   . Chronic pain due to trauma 02/2001   closed head trauma - Followed by Dr [facility]  . Complication of anesthesia   . Dyspepsia   . Head trauma 02/24/2001   closed  . History of kidney stones    passed stone - no surgery required  . Hypothyroidism   . PONV (postoperative nausea and vomiting)   . Pre-diabetes   . SVD (spontaneous vaginal delivery)    fetal demise at 5 months   Past Surgical History:  Procedure Laterality Date  . APPENDECTOMY    . DILATATION & CURETTAGE/HYSTEROSCOPY WITH MYOSURE N/A 04/15/2017   Procedure: DILATATION & CURETTAGE/HYSTEROSCOPY WITH MYOSURE POLYPECTOMY;  Surgeon: Everlene Farrier, MD;  Location: [facility];  Service: Gynecology;  Laterality: N/A;  . DILATATION & CURETTAGE/HYSTEROSCOPY WITH MYOSURE N/A 06/18/2019   Procedure: DILATATION & CURETTAGE/HYSTEROSCOPY WITH  MYOSURE;  Surgeon: Everlene Farrier, MD;  Location: [facility];  Service: Gynecology;  Laterality: N/A;  . EYE SURGERY      cataracts removed  . INCONTINENCE SURGERY    . PILONIDAL CYST EXCISION     x2  . repair of toe laceration     dermabond to right big toe right foot  . repair of torn ligament right leg     patient denies this surgery  . right foot fracture     cast only  . TONSILLECTOMY    . WISDOM TOOTH EXTRACTION     Social History   Socioeconomic History  . Marital status: Married    Spouse name: Not on file  . Number of children: Not on file  . Years of education: Not on file  . Highest education level: Not on file  Occupational History  . Occupation: disability  Tobacco Use  . Smoking status: Never Smoker  . Smokeless tobacco: Never Used  Vaping Use  . Vaping Use: Never used  Substance and Sexual Activity  . Alcohol use: No    Comment: "1 drink/year"  . Drug use: Yes    Types: Morphine    Comment: Morphine 15 mg tab 4xdaily prn  . Sexual activity: Not Currently    Birth control/protection: None  Other Topics Concern  . 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 Difficulty of Paying Living Expenses: Not hard at all  Food Insecurity: No Food Insecurity  . Worried About Charity fundraiser in the Last Year: Never true  . Ran Out of Food in the Last Year: Never true  Transportation Needs: No Transportation Needs  . Lack of Transportation (Medical): No  . Lack of Transportation (Non-Medical): No  Physical Activity: Inactive  . Days of Exercise per Week: 0 days  . Minutes of Exercise per Session: 0 min  Stress: No Stress Concern Present  . Feeling of Stress : Only a little  Social Connections:   . Frequency of Communication with Friends and Family:   . Frequency of Social Gatherings with Friends and Family:   . Attends Religious Services:    . Active Member of Clubs or Organizations:   . Attends Archivist Meetings:   Marland Kitchen Marital Status:    Family History  Problem Relation Age of Onset  . Heart Problems Father   . Cancer Maternal Grandmother        ovarion  . Multiple sclerosis Mother    Allergies  Allergen Reactions  . Doxycycline Anaphylaxis, Diarrhea and Nausea And Vomiting    headache  . Oxycodone-Acetaminophen Hives and Nausea And Vomiting  . Prochlorperazine Edisylate Other (See Comments)    Hyper and jitteriness  . Amoxicillin Rash and Other (See Comments)    Has patient had a PCN reaction causing immediate rash, facial/tongue/throat swelling, SOB or lightheadedness with hypotension: Unknown Has patient had a PCN reaction causing severe rash involving mucus membranes or skin necrosis: Unknown Has patient had a PCN reaction that required hospitalization: No Has patient had a PCN reaction occurring within the last 10 years: Yes If all of the above answers are "NO", then may proceed with Cephalosporin use.   . Compazine Other (See Comments)    Hyper/jitters/blotches  . Eszopiclone Other (See Comments)    Metallic taste and became un effective   . Keppra [Levetiracetam] Other (See Comments)    hyperactive  . Latex   . Levetiracetam Other (See Comments)    Jitters/hyper  . Neurontin [Gabapentin] Nausea And Vomiting  . Propoxyphene Hives  . Trileptal [Oxcarbazepine] Other (See Comments)    REACTION: immune system suppressed  . Bactrim [Sulfamethoxazole-Trimethoprim] Rash  . Bupropion Rash  . Codeine Rash  . Hydrocodone-Acetaminophen Rash  . Moxifloxacin Swelling and Rash  . Propoxyphene N-Acetaminophen Nausea And Vomiting and Rash  . Vicodin [Hydrocodone-Acetaminophen] Rash   Prior to Admission medications   Medication Sig Start Date End Date Taking? Authorizing Provider  albuterol (PROAIR HFA) 108 (90 Base) MCG/ACT inhaler Inhale 2 puffs into the lungs every 4 (four) hours as needed for  wheezing or shortness of breath. 02/25/18  Yes Rodriguez-Southworth, Sunday Spillers, PA-C  amoxicillin (AMOXIL) 875 MG tablet Take 1 tablet (875 mg total) by mouth 2 (two) times daily. 10/09/19  Yes [name], MD  baclofen (LIORESAL) 10 MG tablet Take 5-10 mg by mouth every 8 (eight) hours as needed for muscle spasms.  07/15/17  Yes [provider]  cetirizine (ZYRTEC) 10 MG tablet Take 10 mg by mouth daily.   Yes [provider]  Cholecalciferol (VITAMIN D) 50 MCG (2000 UT) CAPS Take by mouth daily.   Yes [provider]  diazepam (VALIUM) 10 MG tablet Take 10 mg by mouth 4 (four) times daily as needed for anxiety.    Yes [provider]  diphenhydrAMINE (BENADRYL ALLERGY) 25 mg capsule Take 25 mg by mouth every 6 (six) hours as needed for allergies.    Yes [provider]  ferrous sulfate 325 (65 FE) MG tablet Take 325 mg by mouth daily with breakfast.   Yes [provider]  ibuprofen (ADVIL,MOTRIN) 200 MG tablet Take 800 mg by mouth every 6 (six) hours as needed for headache or moderate pain.    Yes [provider]  levocetirizine (XYZAL) 5 MG tablet Take 1 tablet (5 mg total) by mouth every evening. 10/07/19  Yes [name], MD  levonorgestrel (MIRENA, 52 MG,) 20 MCG/24HR IUD Mirena 20 mcg/24 hours (6 yrs) 52 mg intrauterine device  Take 1 device by intrauterine route.   Yes [provider]  levothyroxine (SYNTHROID) 100 MCG tablet Take 1 tablet (100 mcg total) by mouth daily before breakfast. 08/13/19  Yes [name], MD  magnesium oxide (MAG-OX) 400 MG tablet Take 400 mg by mouth daily.   Yes [provider]  morphine (MSIR) 15 MG tablet Take 15 mg by mouth 4 (four) times daily as needed for severe pain.    Yes [provider]  Multiple Minerals-Vitamins (CALCIUM-MAGNESIUM-ZINC-D3 PO) Take 1 tablet by mouth in the morning, at noon, and at bedtime.   Yes [provider]  progesterone (PROMETRIUM) 200  MG capsule TAKE 1 CAPSULE BY MOUTH ONCE DAILY IN THE EVENING FOR 10 DAYS 08/11/19  Yes [provider]  promethazine (PHENERGAN) 25 MG tablet Take 1 tablet (25 mg total) by mouth every 6 (six) hours as needed for nausea or vomiting. 10/19/14  Yes Noemi Chapel, MD  RYVENT 6 MG TABS Take 1 tablet by mouth twice daily 10/26/19  Yes [name], FNP  SUDOGEST 30 MG tablet TAKE 1 TABLET BY MOUTH TWICE DAILY AS NEEDED FOR CONGESTION 08/03/19  Yes [name], FNP  thiamine 100 MG tablet Take 100 mg by mouth daily.   Yes [provider]  traMADol (ULTRAM) 50 MG tablet Take 1 tablet (50 mg total) by mouth every 6 (six) hours as needed. 09/01/15  Yes Biagio Borg, MD     Positive ROS: Otherwise negative  All other systems have been reviewed and were otherwise negative with the exception of those mentioned in the HPI and as above.  Physical Exam: Constitutional: Alert, well-appearing, no acute distress Ears: External ears without lesions or tenderness. Ear canals are clear bilaterally with intact, clear TMs.  Nasal: External nose without lesions. Septum with slight deformity and mild rhinitis.  Both middle meatus regions appear clear.. Nasal endoscopy revealed clear middle meatus bilaterally.  Posterior ethmoid sphenoid region was clear.  Posterior nasal cavity was clear.  Presently no evidence of acute infection.  Mucus within the nasal cavity is clear. Oral: Lips and gums without lesions. Tongue and palate mucosa without lesions. Posterior oropharynx clear.  Patient is status post tonsillectomy.  No postnasal drainage noted in the oropharynx. Neck: No palpable adenopathy or masses Respiratory: Breathing comfortably  Skin: No facial/neck lesions or rash noted.  Nasal/sinus endoscopy  Date/Time: 10/28/2019 2:01 PM Performed by: Rozetta Nunnery, MD Authorized by: Rozetta Nunnery, MD   Consent:    Consent obtained:  Verbal   Consent given by:  Patient Procedure  details:    Indications: sino-nasal symptoms     Medication:  Afrin   Scope location: bilateral nare   Septum:    normal   Sinus:    Right middle meatus: normal  Left middle meatus: normal     Right nasopharynx: normal     Left nasopharynx: normal   Comments:     On nasal endoscopy both middle meatus regions were clear with no evidence of active infection.    Assessment: Chronic rhinitis. History of recurrent sinus infections. On present clinical exam and review of previous CT scans patient does not appear to have any chronic sinus issues but does have a moderate size left maxillary sinus retention cyst and reviewed this with her.  Plan: Recommended regular use of nasal steroid spray Nasacort 2 sprays each nostril at night as this will help with congestion as well as recurrent sinus issues.  Also discussed with her concerning use of antihistamines as needed and saline irrigations when the mucus is thick. Would not recommend use of antibiotics unless she is having persistent thick colored discharge for over 5 days.  She has clear exam today. She might benefit by seeing an allergist and discussed this with her.   Radene Journey, MD   CC:

## 2019-10-29 ENCOUNTER — Encounter: Payer: Self-pay | Admitting: Internal Medicine

## 2019-11-04 DIAGNOSIS — K8050 Calculus of bile duct without cholangitis or cholecystitis without obstruction: Secondary | ICD-10-CM | POA: Insufficient documentation

## 2019-11-09 ENCOUNTER — Encounter: Payer: Self-pay | Admitting: Internal Medicine

## 2019-11-10 ENCOUNTER — Encounter: Payer: Self-pay | Admitting: Internal Medicine

## 2019-11-10 ENCOUNTER — Ambulatory Visit (INDEPENDENT_AMBULATORY_CARE_PROVIDER_SITE_OTHER): Payer: Medicare Other | Admitting: Internal Medicine

## 2019-11-10 ENCOUNTER — Other Ambulatory Visit: Payer: Self-pay

## 2019-11-10 VITALS — BP 124/86 | HR 81 | Temp 97.9°F | Ht 72.0 in | Wt 375.6 lb

## 2019-11-10 DIAGNOSIS — Z6841 Body Mass Index (BMI) 40.0 and over, adult: Secondary | ICD-10-CM

## 2019-11-10 DIAGNOSIS — R1011 Right upper quadrant pain: Secondary | ICD-10-CM

## 2019-11-10 DIAGNOSIS — R1013 Epigastric pain: Secondary | ICD-10-CM | POA: Diagnosis not present

## 2019-11-10 DIAGNOSIS — K760 Fatty (change of) liver, not elsewhere classified: Secondary | ICD-10-CM

## 2019-11-10 NOTE — Progress Notes (Signed)
This visit occurred during the SARS-CoV-2 public health emergency.  Safety protocols were in place, including screening questions prior to the visit, additional usage of staff PPE, and extensive cleaning of exam room while observing appropriate contact time as indicated for disinfecting solutions.  Subjective:     Patient ID: [name] Wright , female    DOB: 05-01-1965 , 54 y.o.   MRN: [id number]   Chief Complaint  Patient presents with  . Abdominal Pain    HPI  The patient is here to for evaluation of her gallbladder. She has been having worsening abdominal pain, wants to make sure it is notdue to her gallbladder. She has been told she has GB issues in the past.  Abdominal Pain This is a recurrent problem. The current episode started 1 to 4 weeks ago. The onset quality is gradual. The problem occurs 2 to 4 times per day. The problem has been gradually worsening. The pain is located in the RUQ. The pain is at a severity of 6/10. The pain is moderate. The quality of the pain is aching and dull. The abdominal pain radiates to the right flank. Associated symptoms include vomiting.     Past Medical History:  Diagnosis Date  . Acute cystitis   . Allergic rhinitis   . Childhood asthma    no problems as adult - no inhaler  . Chronic insomnia   . Chronic pain due to trauma 02/2001   closed head trauma - Followed by Dr [facility]  . Complication of anesthesia   . Dyspepsia   . Head trauma 02/24/2001   closed  . History of kidney stones    passed stone - no surgery required  . Hypothyroidism   . PONV (postoperative nausea and vomiting)   . Pre-diabetes   . SVD (spontaneous vaginal delivery)    fetal demise at 5 months     Family History  Problem Relation Age of Onset  . Heart Problems Father   . Cancer Maternal Grandmother        ovarion  . Multiple sclerosis Mother      Current Outpatient Medications:  .  albuterol (PROAIR HFA) 108 (90  Base) MCG/ACT inhaler, Inhale 2 puffs into the lungs every 4 (four) hours as needed for wheezing or shortness of breath., Disp: 1 Inhaler, Rfl: 1 .  baclofen (LIORESAL) 10 MG tablet, Take 5-10 mg by mouth every 8 (eight) hours as needed for muscle spasms. , Disp: , Rfl:  .  cetirizine (ZYRTEC) 10 MG tablet, Take 10 mg by mouth daily., Disp: , Rfl:  .  Cholecalciferol (VITAMIN D) 50 MCG (2000 UT) CAPS, Take by mouth daily., Disp: , Rfl:  .  diazepam (VALIUM) 10 MG tablet, Take 10 mg by mouth 4 (four) times daily as needed for anxiety. , Disp: , Rfl:  .  diphenhydrAMINE (BENADRYL ALLERGY) 25 mg capsule, Take 25 mg by mouth every 6 (six) hours as needed for allergies. , Disp: , Rfl:  .  ferrous sulfate 325 (65 FE) MG tablet, Take 325 mg by mouth daily with breakfast., Disp: , Rfl:  .  fluticasone (FLONASE) 50 MCG/ACT nasal spray, Place into both nostrils daily., Disp: , Rfl:  .  ibuprofen (ADVIL,MOTRIN) 200 MG tablet, Take 800 mg by mouth every 6 (six) hours as needed for headache or moderate pain. , Disp: , Rfl:  .  levocetirizine (XYZAL) 5 MG tablet, Take 1 tablet (5 mg total) by mouth every evening., Disp:  30 tablet, Rfl: 2 .  levonorgestrel (MIRENA, 52 MG,) 20 MCG/24HR IUD, Mirena 20 mcg/24 hours (6 yrs) 52 mg intrauterine device  Take 1 device by intrauterine route., Disp: , Rfl:  .  levothyroxine (SYNTHROID) 100 MCG tablet, Take 1 tablet (100 mcg total) by mouth daily before breakfast., Disp: 90 tablet, Rfl: 0 .  magnesium oxide (MAG-OX) 400 MG tablet, Take 400 mg by mouth daily., Disp: , Rfl:  .  morphine (MSIR) 15 MG tablet, Take 15 mg by mouth 4 (four) times daily as needed for severe pain. , Disp: , Rfl:  .  Multiple Minerals-Vitamins (CALCIUM-MAGNESIUM-ZINC-D3 PO), Take 1 tablet by mouth in the morning, at noon, and at bedtime., Disp: , Rfl:  .  progesterone (PROMETRIUM) 200 MG capsule, TAKE 1 CAPSULE BY MOUTH ONCE DAILY IN THE EVENING FOR 10 DAYS, Disp: , Rfl:  .  promethazine (PHENERGAN) 25  MG tablet, Take 1 tablet (25 mg total) by mouth every 6 (six) hours as needed for nausea or vomiting., Disp: 12 tablet, Rfl: 0 .  RYVENT 6 MG TABS, Take 1 tablet by mouth twice daily, Disp: 30 tablet, Rfl: 0 .  SUDOGEST 30 MG tablet, TAKE 1 TABLET BY MOUTH TWICE DAILY AS NEEDED FOR CONGESTION, Disp: 90 tablet, Rfl: 0 .  thiamine 100 MG tablet, Take 100 mg by mouth daily., Disp: , Rfl:  .  traMADol (ULTRAM) 50 MG tablet, Take 1 tablet (50 mg total) by mouth every 6 (six) hours as needed., Disp: 120 tablet, Rfl: 0   Allergies  Allergen Reactions  . Doxycycline Anaphylaxis, Diarrhea and Nausea And Vomiting    headache  . Oxycodone-Acetaminophen Hives and Nausea And Vomiting  . Prochlorperazine Edisylate Other (See Comments)    Hyper and jitteriness  . Amoxicillin Rash and Other (See Comments)    Has patient had a PCN reaction causing immediate rash, facial/tongue/throat swelling, SOB or lightheadedness with hypotension: Unknown Has patient had a PCN reaction causing severe rash involving mucus membranes or skin necrosis: Unknown Has patient had a PCN reaction that required hospitalization: No Has patient had a PCN reaction occurring within the last 10 years: Yes If all of the above answers are "NO", then may proceed with Cephalosporin use.   . Compazine Other (See Comments)    Hyper/jitters/blotches  . Eszopiclone Other (See Comments)    Metallic taste and became un effective   . Keppra [Levetiracetam] Other (See Comments)    hyperactive  . Latex   . Levetiracetam Other (See Comments)    Jitters/hyper  . Neurontin [Gabapentin] Nausea And Vomiting  . Propoxyphene Hives  . Trileptal [Oxcarbazepine] Other (See Comments)    REACTION: immune system suppressed  . Bactrim [Sulfamethoxazole-Trimethoprim] Rash  . Bupropion Rash  . Codeine Rash  . Hydrocodone-Acetaminophen Rash  . Moxifloxacin Swelling and Rash  . Propoxyphene N-Acetaminophen Nausea And Vomiting and Rash  . Vicodin  [Hydrocodone-Acetaminophen] Rash     Review of Systems  Constitutional: Negative.   Respiratory: Negative.   Cardiovascular: Negative.   Gastrointestinal: Positive for abdominal pain and vomiting.  Psychiatric/Behavioral: Negative.   All other systems reviewed and are negative.    Today's Vitals   11/10/19 1445  BP: 124/86  Pulse: 81  Temp: 97.9 F (36.6 C)  TempSrc: Oral  Weight: (!) 375 lb 9.6 oz (170.4 kg)  Height: 6' (1.829 m)  PainSc: 4   PainLoc: Abdomen   Body mass index is 50.94 kg/m.  Wt Readings from Last 3 Encounters:  11/10/19 (!) 375  lb 9.6 oz (170.4 kg)  10/07/19 (!) 382 lb 12.8 oz (173.6 kg)  08/13/19 (!) 384 lb (174.2 kg)   Objective:  Physical Exam Vitals and nursing note reviewed.  Constitutional:      Appearance: Normal appearance. She is well-developed. She is obese.  HENT:     Head: Normocephalic and atraumatic.  Cardiovascular:     Rate and Rhythm: Normal rate and regular rhythm.     Heart sounds: Normal heart sounds.  Pulmonary:     Breath sounds: Normal breath sounds.  Abdominal:     General: Bowel sounds are normal.     Palpations: Abdomen is soft.     Tenderness: There is abdominal tenderness in the right upper quadrant and epigastric area.     Comments: Difficult to assess organomegaly due to body habitus  Skin:    General: Skin is warm.  Neurological:     General: No focal deficit present.     Mental Status: She is alert and oriented to person, place, and time.         Assessment And Plan:     1. RUQ pain Comments: 2016 abdominal ultrasound results reviewed in full detail. She agrees to repeat study and encouraged to avoid fried foods. Her sx are suggestive of gallbladder disease. - US Abdomen Limited RUQ; Future - CMP14+EGFR  2. Epigastric pain Comments: I will check amylase/lipase levels.  - Amylase - Lipase  3. Hepatic steatosis Comments: Chronic. Encouraged to follow a low carb diet. Importance of regular exercise  and dietary compliance was also  discussed with the patient.  - CMP14+EGFR  4. Class 3 severe obesity due to excess calories with serious comorbidity and body mass index (BMI) of 50.0 to 59.9 in adult [facility])   She was congratulated on her 7 pound weight loss since her last visit. She is encouraged to strive for BMI less than 45 to decrease cardiac risk. Advised to aim for at least 150 minutes of exercise per week.   Patient was given opportunity to ask questions. Patient verbalized understanding of the plan and was able to repeat key elements of the plan. All questions were answered to their satisfaction.  Maximino Greenland, MD   I, Maximino Greenland, MD, have reviewed all documentation for this visit. The documentation on 11/10/19 for the exam, diagnosis, procedures, and orders are all accurate and complete.  THE PATIENT IS ENCOURAGED TO PRACTICE SOCIAL DISTANCING DUE TO THE COVID-19 PANDEMIC.

## 2019-11-10 NOTE — Patient Instructions (Signed)
Gallbladder Eating Plan If you have a gallbladder condition, you may have trouble digesting fats. Eating a low-fat diet can help reduce your symptoms, and may be helpful before and after having surgery to remove your gallbladder (cholecystectomy). Your health care provider may recommend that you work with a diet and nutrition specialist (dietitian) to help you reduce the amount of fat in your diet. What are tips for following this plan? General guidelines  Limit your fat intake to less than 30% of your total daily calories. If you eat around 1,800 calories each day, this is less than 60 grams (g) of fat per day.  Fat is an important part of a healthy diet. Eating a low-fat diet can make it hard to maintain a healthy body weight. Ask your dietitian how much fat, calories, and other nutrients you need each day.  Eat small, frequent meals throughout the day instead of three large meals.  Drink at least 8-10 cups of fluid a day. Drink enough fluid to keep your urine clear or pale yellow.  Limit alcohol intake to no more than 1 drink a day for nonpregnant women and 2 drinks a day for men. One drink equals 12 oz of beer, 5 oz of wine, or 1 oz of hard liquor. Reading food labels  Check Nutrition Facts on food labels for the amount of fat per serving. Choose foods with less than 3 grams of fat per serving. Shopping  Choose nonfat and low-fat healthy foods. Look for the words "nonfat," "low fat," or "fat free."  Avoid buying processed or prepackaged foods. Cooking  Cook using low-fat methods, such as baking, broiling, grilling, or boiling.  Cook with small amounts of healthy fats, such as olive oil, grapeseed oil, canola oil, or sunflower oil. What foods are recommended?   All fresh, frozen, or canned fruits and vegetables.  Whole grains.  Low-fat or non-fat (skim) milk and yogurt.  Lean meat, skinless poultry, fish, eggs, and beans.  Low-fat protein supplement powders or  drinks.  Spices and herbs. What foods are not recommended?  High-fat foods. These include baked goods, fast food, fatty cuts of meat, ice cream, french toast, sweet rolls, pizza, cheese bread, foods covered with butter, creamy sauces, or cheese.  Fried foods. These include french fries, tempura, battered fish, breaded chicken, fried breads, and sweets.  Foods with strong odors.  Foods that cause bloating and gas. Summary  A low-fat diet can be helpful if you have a gallbladder condition, or before and after gallbladder surgery.  Limit your fat intake to less than 30% of your total daily calories. This is about 60 g of fat if you eat 1,800 calories each day.  Eat small, frequent meals throughout the day instead of three large meals. This information is not intended to replace advice given to you by your health care provider. Make sure you discuss any questions you have with your health care provider. Document Revised: 07/17/2018 Document Reviewed: 05/03/2016 Elsevier Patient Education  2020 [employer]  

## 2019-11-11 LAB — CMP14+EGFR
ALT: 25 IU/L (ref 0–32)
AST: 21 IU/L (ref 0–40)
Albumin/Globulin Ratio: 1.3 (ref 1.2–2.2)
Albumin: 4.3 g/dL (ref 3.8–4.9)
Alkaline Phosphatase: 122 IU/L — ABNORMAL HIGH (ref 48–121)
BUN/Creatinine Ratio: 19 (ref 9–23)
BUN: 16 mg/dL (ref 6–24)
Bilirubin Total: 0.3 mg/dL (ref 0.0–1.2)
CO2: 28 mmol/L (ref 20–29)
Calcium: 9.5 mg/dL (ref 8.7–10.2)
Chloride: 102 mmol/L (ref 96–106)
Creatinine, Ser: 0.85 mg/dL (ref 0.57–1.00)
GFR calc Af Amer: 91 mL/min/{1.73_m2} (ref >59)
GFR calc non Af Amer: 79 mL/min/{1.73_m2} (ref >59)
Globulin, Total: 3.3 g/dL (ref 1.5–4.5)
Glucose: 139 mg/dL — ABNORMAL HIGH (ref 65–99)
Potassium: 4.3 mmol/L (ref 3.5–5.2)
Sodium: 143 mmol/L (ref 134–144)
Total Protein: 7.6 g/dL (ref 6.0–8.5)

## 2019-11-11 LAB — AMYLASE: Amylase: 27 U/L — ABNORMAL LOW (ref 31–110)

## 2019-11-11 LAB — LIPASE: Lipase: 15 U/L (ref 14–72)

## 2019-11-18 ENCOUNTER — Ambulatory Visit
Admission: RE | Admit: 2019-11-18 | Discharge: 2019-11-18 | Disposition: A | Discharge status: Home / Self Care | Payer: Medicare Other | Source: Ambulatory Visit | Attending: Internal Medicine | Admitting: Internal Medicine

## 2019-11-18 DIAGNOSIS — R1011 Right upper quadrant pain: Secondary | ICD-10-CM | POA: Diagnosis not present

## 2019-12-10 ENCOUNTER — Other Ambulatory Visit: Payer: Self-pay | Admitting: Internal Medicine

## 2019-12-11 DIAGNOSIS — H0100B Unspecified blepharitis left eye, upper and lower eyelids: Secondary | ICD-10-CM | POA: Diagnosis not present

## 2019-12-11 DIAGNOSIS — H02834 Dermatochalasis of left upper eyelid: Secondary | ICD-10-CM | POA: Diagnosis not present

## 2019-12-11 DIAGNOSIS — H0100A Unspecified blepharitis right eye, upper and lower eyelids: Secondary | ICD-10-CM | POA: Diagnosis not present

## 2019-12-11 DIAGNOSIS — H02831 Dermatochalasis of right upper eyelid: Secondary | ICD-10-CM | POA: Diagnosis not present

## 2019-12-11 DIAGNOSIS — H16223 Keratoconjunctivitis sicca, not specified as Sjogren's, bilateral: Secondary | ICD-10-CM | POA: Diagnosis not present

## 2020-01-15 ENCOUNTER — Other Ambulatory Visit: Payer: Self-pay | Admitting: Nurse Practitioner

## 2020-01-25 ENCOUNTER — Encounter: Payer: Self-pay | Admitting: Internal Medicine

## 2020-01-25 ENCOUNTER — Ambulatory Visit (INDEPENDENT_AMBULATORY_CARE_PROVIDER_SITE_OTHER): Payer: Medicare Other | Admitting: Internal Medicine

## 2020-01-25 ENCOUNTER — Other Ambulatory Visit: Payer: Self-pay

## 2020-01-25 VITALS — BP 130/78 | HR 80 | Temp 98.2°F | Ht 67.0 in | Wt 381.0 lb

## 2020-01-25 DIAGNOSIS — R7309 Other abnormal glucose: Secondary | ICD-10-CM | POA: Diagnosis not present

## 2020-01-25 DIAGNOSIS — E039 Hypothyroidism, unspecified: Secondary | ICD-10-CM

## 2020-01-25 DIAGNOSIS — F5101 Primary insomnia: Secondary | ICD-10-CM

## 2020-01-25 DIAGNOSIS — Z0000 Encounter for general adult medical examination without abnormal findings: Secondary | ICD-10-CM

## 2020-01-25 DIAGNOSIS — Z6841 Body Mass Index (BMI) 40.0 and over, adult: Secondary | ICD-10-CM

## 2020-01-25 DIAGNOSIS — Z1211 Encounter for screening for malignant neoplasm of colon: Secondary | ICD-10-CM

## 2020-01-25 DIAGNOSIS — E559 Vitamin D deficiency, unspecified: Secondary | ICD-10-CM

## 2020-01-25 DIAGNOSIS — Z2821 Immunization not carried out because of patient refusal: Secondary | ICD-10-CM

## 2020-01-25 NOTE — Progress Notes (Signed)
I,[name] Wright,acting as a Education administrator for [name] Greenland, MD.,have documented all relevant documentation on the behalf of [name] Greenland, MD,as directed by  [name] Greenland, MD while in the presence of [name] Greenland, MD.  This visit occurred during the SARS-CoV-2 public health emergency.  Safety protocols were in place, including screening questions prior to the visit, additional usage of staff PPE, and extensive cleaning of exam room while observing appropriate contact time as indicated for disinfecting solutions.  Subjective:     Patient ID: [name] Wright , female    DOB: Feb 26, 1966 , 54 y.o.   MRN: [id number]   Chief Complaint  Patient presents with  . Annual Exam    HPI  Patient is here for full physical exam. She states that she is compliant with medications. She has no concerns at this time. Patient declines both COVID and influenza vaccines. She is seen by Dr Gertie Fey at [facility] for her GYN care.     Past Medical History:  Diagnosis Date  . Acute cystitis   . Allergic rhinitis   . Childhood asthma    no problems as adult - no inhaler  . Chronic insomnia   . Chronic pain due to trauma 02/2001   closed head trauma - Followed by Dr [facility]  . Complication of anesthesia   . Dyspepsia   . Head trauma 02/24/2001   closed  . History of kidney stones    passed stone - no surgery required  . Hypothyroidism   . PONV (postoperative nausea and vomiting)   . Pre-diabetes   . SVD (spontaneous vaginal delivery)    fetal demise at 5 months     Family History  Problem Relation Age of Onset  . Heart Problems Father   . Cancer Maternal Grandmother        ovarion  . Multiple sclerosis Mother      Current Outpatient Medications:  .  albuterol (PROAIR HFA) 108 (90 Base) MCG/ACT inhaler, Inhale 2 puffs into the lungs every 4 (four) hours as needed for wheezing or shortness of breath., Disp: 1 Inhaler, Rfl: 1 .   baclofen (LIORESAL) 10 MG tablet, Take 5-10 mg by mouth every 8 (eight) hours as needed for muscle spasms. , Disp: , Rfl:  .  cetirizine (ZYRTEC) 10 MG tablet, Take 10 mg by mouth daily., Disp: , Rfl:  .  Cholecalciferol (VITAMIN D) 50 MCG (2000 UT) CAPS, Take by mouth daily., Disp: , Rfl:  .  diazepam (VALIUM) 10 MG tablet, Take 10 mg by mouth 4 (four) times daily as needed for anxiety. , Disp: , Rfl:  .  diphenhydrAMINE (BENADRYL ALLERGY) 25 mg capsule, Take 25 mg by mouth every 6 (six) hours as needed for allergies. , Disp: , Rfl:  .  ferrous sulfate 325 (65 FE) MG tablet, Take 325 mg by mouth daily with breakfast., Disp: , Rfl:  .  fluticasone (FLONASE) 50 MCG/ACT nasal spray, Place into both nostrils daily., Disp: , Rfl:  .  ibuprofen (ADVIL,MOTRIN) 200 MG tablet, Take 800 mg by mouth every 6 (six) hours as needed for headache or moderate pain. , Disp: , Rfl:  .  levonorgestrel (MIRENA, 52 MG,) 20 MCG/24HR IUD, Mirena 20 mcg/24 hours (6 yrs) 52 mg intrauterine device  Take 1 device by intrauterine route., Disp: , Rfl:  .  levothyroxine (SYNTHROID) 100 MCG tablet, TAKE 1 TABLET BY MOUTH BEFORE BREAKFAST, Disp: 90 tablet, Rfl: 0 .  meclizine (ANTIVERT) 25 MG tablet, 1/2 tab daily prn, Disp: , Rfl:  .  morphine (MSIR) 15 MG tablet, Take 15 mg by mouth 4 (four) times daily as needed for severe pain. , Disp: , Rfl:  .  Multiple Minerals-Vitamins (CALCIUM-MAGNESIUM-ZINC-D3 PO), Take 1 tablet by mouth in the morning, at noon, and at bedtime., Disp: , Rfl:  .  promethazine (PHENERGAN) 25 MG tablet, Take 1 tablet (25 mg total) by mouth every 6 (six) hours as needed for nausea or vomiting., Disp: 12 tablet, Rfl: 0 .  RYVENT 6 MG TABS, Take 1 tablet by mouth twice daily, Disp: 30 tablet, Rfl: 0 .  SUDOGEST MAXIMUM STRENGTH 30 MG tablet, TAKE 1 TABLET BY MOUTH TWICE DAILY AS NEEDED FOR CONGESTION, Disp: 90 tablet, Rfl: 0 .  traMADol (ULTRAM) 50 MG tablet, Take 1 tablet (50 mg total) by mouth every 6 (six)  hours as needed., Disp: 120 tablet, Rfl: 0 .  levocetirizine (XYZAL) 5 MG tablet, TAKE 1 TABLET BY MOUTH ONCE DAILY IN THE EVENING, Disp: 90 tablet, Rfl: 1 .  RESTASIS 0.05 % ophthalmic emulsion, 1 drop 2 (two) times daily., Disp: , Rfl:    Allergies  Allergen Reactions  . Doxycycline Anaphylaxis, Diarrhea and Nausea And Vomiting    headache  . Oxycodone-Acetaminophen Hives and Nausea And Vomiting  . Prochlorperazine Edisylate Other (See Comments)    Hyper and jitteriness  . Amoxicillin Rash and Other (See Comments)    Has patient had a PCN reaction causing immediate rash, facial/tongue/throat swelling, SOB or lightheadedness with hypotension: Unknown Has patient had a PCN reaction causing severe rash involving mucus membranes or skin necrosis: Unknown Has patient had a PCN reaction that required hospitalization: No Has patient had a PCN reaction occurring within the last 10 years: Yes If all of the above answers are "NO", then may proceed with Cephalosporin use.   . Compazine Other (See Comments)    Hyper/jitters/blotches  . Eszopiclone Other (See Comments)    Metallic taste and became un effective   . Keppra [Levetiracetam] Other (See Comments)    hyperactive  . Latex   . Levetiracetam Other (See Comments)    Jitters/hyper  . Neurontin [Gabapentin] Nausea And Vomiting  . Propoxyphene Hives  . Trileptal [Oxcarbazepine] Other (See Comments)    REACTION: immune system suppressed  . Bactrim [Sulfamethoxazole-Trimethoprim] Rash  . Bupropion Rash  . Codeine Rash  . Hydrocodone-Acetaminophen Rash  . Moxifloxacin Swelling and Rash  . Propoxyphene N-Acetaminophen Nausea And Vomiting and Rash  . Vicodin [Hydrocodone-Acetaminophen] Rash      The patient states she uses IUD for birth control. Last LMP was No LMP recorded. (Menstrual status: IUD).. Negative for Dysmenorrhea. Negative for: breast discharge, breast lump(s), breast pain and breast self exam. Associated symptoms include  abnormal vaginal bleeding. Pertinent negatives include abnormal bleeding (hematology), anxiety, decreased libido, depression, difficulty falling sleep, dyspareunia, history of infertility, nocturia, sexual dysfunction, sleep disturbances, urinary incontinence, urinary urgency, vaginal discharge and vaginal itching. Diet regular.The patient states her exercise level is  minimal.  . The patient's tobacco use is:  Social History   Tobacco Use  Smoking Status Never Smoker  Smokeless Tobacco Never Used  . She has been exposed to passive smoke. The patient's alcohol use is:  Social History   Substance and Sexual Activity  Alcohol Use No   Comment: "1 drink/year"    Review of Systems  Constitutional: Negative.   HENT: Negative.   Eyes: Negative.   Respiratory: Negative.  Cardiovascular: Negative.   Gastrointestinal: Negative.   Endocrine: Negative.   Genitourinary: Negative.   Musculoskeletal: Negative.   Skin: Negative.   Allergic/Immunologic: Negative.   Neurological: Negative.   Hematological: Negative.   Psychiatric/Behavioral: Negative.      Today's Vitals   01/25/20 1108  BP: 130/78  Pulse: 80  Temp: 98.2 F (36.8 C)  TempSrc: Oral  Weight: (!) 381 lb (172.8 kg)  Height: 5\' 7"  (1.702 m)   Body mass index is 59.67 kg/m.   Objective:  Physical Exam Vitals and nursing note reviewed.  Constitutional:      Appearance: Normal appearance. She is obese.  HENT:     Head: Normocephalic and atraumatic.     Right Ear: Tympanic membrane, ear canal and external ear normal.     Left Ear: Tympanic membrane, ear canal and external ear normal.     Nose:     Comments: Deferred, masked    Mouth/Throat:     Comments: Deferred, masked Eyes:     Extraocular Movements: Extraocular movements intact.     Conjunctiva/sclera: Conjunctivae normal.     Pupils: Pupils are equal, round, and reactive to light.  Cardiovascular:     Rate and Rhythm: Normal rate and regular rhythm.      Pulses: Normal pulses.     Heart sounds: Normal heart sounds.  Pulmonary:     Effort: Pulmonary effort is normal.     Breath sounds: Normal breath sounds.  Chest:     Breasts: Tanner Score is 5.        Right: Normal.        Left: Normal.  Abdominal:     General: Bowel sounds are normal.     Palpations: Abdomen is soft.     Comments: Obese, soft.   Genitourinary:    Comments: deferred Musculoskeletal:        General: Normal range of motion.     Cervical back: Normal range of motion and neck supple.  Skin:    General: Skin is warm and dry.  Neurological:     General: No focal deficit present.     Mental Status: She is alert and oriented to person, place, and time.  Psychiatric:        Mood and Affect: Mood normal.        Behavior: Behavior normal.         Assessment And Plan:     1. Encounter for annual physical exam Comments: A full exam was performed. Importance of monthly self breast exams was discussed with the patient. PATIENT IS ADVISED TO GET 30-45 MINUTES REGULAR EXERCISE NO LESS THAN FOUR TO FIVE DAYS PER WEEK - BOTH WEIGHTBEARING EXERCISES AND AEROBIC ARE RECOMMENDED.  PATIENT IS ADVISED TO FOLLOW A HEALTHY DIET WITH AT LEAST SIX FRUITS/VEGGIES PER DAY, DECREASE INTAKE OF RED MEAT, AND TO INCREASE FISH INTAKE TO TWO DAYS PER WEEK.  MEATS/FISH SHOULD NOT BE FRIED, BAKED OR BROILED IS PREFERABLE.  I SUGGEST WEARING SPF 50 SUNSCREEN ON EXPOSED PARTS AND ESPECIALLY WHEN IN THE DIRECT SUNLIGHT FOR AN EXTENDED PERIOD OF TIME.  PLEASE AVOID FAST FOOD RESTAURANTS AND INCREASE YOUR WATER INTAKE.   2. Primary hypothyroidism Comments: I will check thyroid panel and adjust meds as needed.  - CBC - Lipid panel  3. Other abnormal glucose Comments: Pt advised of last a1c, blood sugar results. Encouraged to avoid sugary beverages and to incorporate more exercise into her daily routine. I will recheck hba1c today.  - Hemoglobin  A1c  4. Vitamin D deficiency Comments: I will check  a vitamin D level and supplement as needed.  - VITAMIN D 25 Hydroxy (Vit-D Deficiency, Fractures)  5. Primary insomnia Comments: Chronic. Encouraged to create a bedtime routine. She was also given samples of Dayvigo, 5mg  to use nightly as needed.   6. Class 3 severe obesity due to excess calories with serious comorbidity and body mass index (BMI) of 50.0 to 59.9 in adult [facility]) Comments: BMI 59. She agrees to referral to [facility] clinic.  - Amb Ref to Medical Weight Management  7. Screen for colon cancer Comments: She does not wish to have colonoscopy at this time. She does agree to have Cologuard testing.  - Cologuard  8. COVID-19 vaccination refused  9. Influenza vaccine refused       Patient was given opportunity to ask questions. Patient verbalized understanding of the plan and was able to repeat key elements of the plan. All questions were answered to their satisfaction.   [name] Greenland, MD   I, [name] Greenland, MD, have reviewed all documentation for this visit. The documentation on 02/14/20 for the exam, diagnosis, procedures, and orders are all accurate and complete.  THE PATIENT IS ENCOURAGED TO PRACTICE SOCIAL DISTANCING DUE TO THE COVID-19 PANDEMIC.

## 2020-01-25 NOTE — Patient Instructions (Signed)
Health Maintenance, Female Adopting a healthy lifestyle and getting preventive care are important in promoting health and wellness. Ask your health care provider about:  The right schedule for you to have regular tests and exams.  Things you can do on your own to prevent diseases and keep yourself healthy. What should I know about diet, weight, and exercise? Eat a healthy diet   Eat a diet that includes plenty of vegetables, fruits, low-fat dairy products, and lean protein.  Do not eat a lot of foods that are high in solid fats, added sugars, or sodium. Maintain a healthy weight Body mass index (BMI) is used to identify weight problems. It estimates body fat based on height and weight. Your health care provider can help determine your BMI and help you achieve or maintain a healthy weight. Get regular exercise Get regular exercise. This is one of the most important things you can do for your health. Most adults should:  Exercise for at least 150 minutes each week. The exercise should increase your heart rate and make you sweat (moderate-intensity exercise).  Do strengthening exercises at least twice a week. This is in addition to the moderate-intensity exercise.  Spend less time sitting. Even light physical activity can be beneficial. Watch cholesterol and blood lipids Have your blood tested for lipids and cholesterol at 54 years of age, then have this test every 5 years. Have your cholesterol levels checked more often if:  Your lipid or cholesterol levels are high.  You are older than 53 years of age.  You are at high risk for heart disease. What should I know about cancer screening? Depending on your health history and family history, you may need to have cancer screening at various ages. This may include screening for:  Breast cancer.  Cervical cancer.  Colorectal cancer.  Skin cancer.  Lung cancer. What should I know about heart disease, diabetes, and high blood  pressure? Blood pressure and heart disease  High blood pressure causes heart disease and increases the risk of stroke. This is more likely to develop in people who have high blood pressure readings, are of African descent, or are overweight.  Have your blood pressure checked: ? Every 3-5 years if you are 18-39 years of age. ? Every year if you are 40 years old or older. Diabetes Have regular diabetes screenings. This checks your fasting blood sugar level. Have the screening done:  Once every three years after age 40 if you are at a normal weight and have a low risk for diabetes.  More often and at a younger age if you are overweight or have a high risk for diabetes. What should I know about preventing infection? Hepatitis B If you have a higher risk for hepatitis B, you should be screened for this virus. Talk with your health care provider to find out if you are at risk for hepatitis B infection. Hepatitis C Testing is recommended for:  Everyone born from 1945 through 1965.  Anyone with known risk factors for hepatitis C. Sexually transmitted infections (STIs)  Get screened for STIs, including gonorrhea and chlamydia, if: ? You are sexually active and are younger than 53 years of age. ? You are older than 53 years of age and your health care provider tells you that you are at risk for this type of infection. ? Your sexual activity has changed since you were last screened, and you are at increased risk for chlamydia or gonorrhea. Ask your health care provider if   you are at risk.  Ask your health care provider about whether you are at high risk for HIV. Your health care provider may recommend a prescription medicine to help prevent HIV infection. If you choose to take medicine to prevent HIV, you should first get tested for HIV. You should then be tested every 3 months for as long as you are taking the medicine. Pregnancy  If you are about to stop having your period (premenopausal) and  you may become pregnant, seek counseling before you get pregnant.  Take 400 to 800 micrograms (mcg) of folic acid every day if you become pregnant.  Ask for birth control (contraception) if you want to prevent pregnancy. Osteoporosis and menopause Osteoporosis is a disease in which the bones lose minerals and strength with aging. This can result in bone fractures. If you are 65 years old or older, or if you are at risk for osteoporosis and fractures, ask your health care provider if you should:  Be screened for bone loss.  Take a calcium or vitamin D supplement to lower your risk of fractures.  Be given hormone replacement therapy (HRT) to treat symptoms of menopause. Follow these instructions at home: Lifestyle  Do not use any products that contain nicotine or tobacco, such as cigarettes, e-cigarettes, and chewing tobacco. If you need help quitting, ask your health care provider.  Do not use street drugs.  Do not share needles.  Ask your health care provider for help if you need support or information about quitting drugs. Alcohol use  Do not drink alcohol if: ? Your health care provider tells you not to drink. ? You are pregnant, may be pregnant, or are planning to become pregnant.  If you drink alcohol: ? Limit how much you use to 0-1 drink a day. ? Limit intake if you are breastfeeding.  Be aware of how much alcohol is in your drink. In the [place] one drink equals one 12 oz bottle of beer (355 mL), one 5 oz glass of wine (148 mL), or one 1 oz glass of hard liquor (44 mL). General instructions  Schedule regular health, dental, and eye exams.  Stay current with your vaccines.  Tell your health care provider if: ? You often feel depressed. ? You have ever been abused or do not feel safe at home. Summary  Adopting a healthy lifestyle and getting preventive care are important in promoting health and wellness.  Follow your health care provider's instructions about healthy  diet, exercising, and getting tested or screened for diseases.  Follow your health care provider's instructions on monitoring your cholesterol and blood pressure. This information is not intended to replace advice given to you by your health care provider. Make sure you discuss any questions you have with your health care provider. Document Revised: 03/19/2018 Document Reviewed: 03/19/2018 Elsevier Patient Education  2020 [employer]  

## 2020-01-26 LAB — LIPID PANEL
Chol/HDL Ratio: 3.9 ratio (ref 0.0–4.4)
Cholesterol, Total: 181 mg/dL (ref 100–199)
HDL: 46 mg/dL (ref >39)
LDL Chol Calc (NIH): 110 mg/dL — ABNORMAL HIGH (ref 0–99)
Triglycerides: 143 mg/dL (ref 0–149)
VLDL Cholesterol Cal: 25 mg/dL (ref 5–40)

## 2020-01-26 LAB — HEMOGLOBIN A1C
Est. average glucose Bld gHb Est-mCnc: 160 mg/dL
Hgb A1c MFr Bld: 7.2 % — ABNORMAL HIGH (ref 4.8–5.6)

## 2020-01-26 LAB — CBC
Hematocrit: 41.8 % (ref 34.0–46.6)
Hemoglobin: 14.2 g/dL (ref 11.1–15.9)
MCH: 29.6 pg (ref 26.6–33.0)
MCHC: 34 g/dL (ref 31.5–35.7)
MCV: 87 fL (ref 79–97)
Platelets: 337 10*3/uL (ref 150–450)
RBC: 4.79 x10E6/uL (ref 3.77–5.28)
RDW: 12.6 % (ref 11.7–15.4)
WBC: 7.5 10*3/uL (ref 3.4–10.8)

## 2020-01-26 LAB — VITAMIN D 25 HYDROXY (VIT D DEFICIENCY, FRACTURES): Vit D, 25-Hydroxy: 22.5 ng/mL — ABNORMAL LOW (ref 30.0–100.0)

## 2020-02-04 ENCOUNTER — Other Ambulatory Visit: Payer: Self-pay | Admitting: Internal Medicine

## 2020-02-04 ENCOUNTER — Encounter: Payer: Self-pay | Admitting: Internal Medicine

## 2020-02-14 ENCOUNTER — Encounter: Payer: Self-pay | Admitting: Internal Medicine

## 2020-02-15 ENCOUNTER — Other Ambulatory Visit: Payer: Self-pay | Admitting: Internal Medicine

## 2020-02-15 DIAGNOSIS — E1165 Type 2 diabetes mellitus with hyperglycemia: Secondary | ICD-10-CM

## 2020-02-15 MED ORDER — VITAMIN D 125 MCG (5000 UT) PO CAPS: ORAL_CAPSULE | ORAL | 2 refills | Status: DC

## 2020-02-26 ENCOUNTER — Ambulatory Visit: Payer: Medicare Other | Admitting: Registered"

## 2020-03-12 DIAGNOSIS — Z1211 Encounter for screening for malignant neoplasm of colon: Secondary | ICD-10-CM | POA: Diagnosis not present

## 2020-03-12 LAB — COLOGUARD: Cologuard: POSITIVE — AB

## 2020-03-18 ENCOUNTER — Ambulatory Visit: Payer: Medicare Other | Admitting: Registered"

## 2020-03-20 LAB — COLOGUARD: COLOGUARD: POSITIVE — AB

## 2020-03-22 ENCOUNTER — Ambulatory Visit: Payer: Medicare Other

## 2020-03-23 ENCOUNTER — Telehealth: Payer: Self-pay

## 2020-03-23 ENCOUNTER — Encounter: Payer: Self-pay | Admitting: Internal Medicine

## 2020-03-23 DIAGNOSIS — R195 Other fecal abnormalities: Secondary | ICD-10-CM

## 2020-03-23 NOTE — Telephone Encounter (Signed)
The pt was notified that her cologaurd was positive and that the pt needed to see her GI doctor.  The pt said she can't remember the name but that she has had a colonoscopy at [facility] in the past.

## 2020-04-07 ENCOUNTER — Encounter: Payer: Self-pay | Admitting: Internal Medicine

## 2020-04-19 ENCOUNTER — Encounter: Payer: Self-pay | Admitting: Internal Medicine

## 2020-05-06 ENCOUNTER — Encounter (INDEPENDENT_AMBULATORY_CARE_PROVIDER_SITE_OTHER): Payer: Self-pay

## 2020-05-09 DIAGNOSIS — M25551 Pain in right hip: Secondary | ICD-10-CM | POA: Diagnosis not present

## 2020-05-09 DIAGNOSIS — M545 Low back pain, unspecified: Secondary | ICD-10-CM | POA: Diagnosis not present

## 2020-06-03 ENCOUNTER — Encounter: Payer: Self-pay | Admitting: Internal Medicine

## 2020-06-18 ENCOUNTER — Other Ambulatory Visit: Payer: Self-pay | Admitting: Internal Medicine

## 2020-06-23 ENCOUNTER — Encounter: Payer: Self-pay | Admitting: Internal Medicine

## 2020-06-24 ENCOUNTER — Other Ambulatory Visit: Payer: Self-pay

## 2020-06-24 ENCOUNTER — Ambulatory Visit (INDEPENDENT_AMBULATORY_CARE_PROVIDER_SITE_OTHER): Payer: Medicare Other | Admitting: Nurse Practitioner

## 2020-06-24 VITALS — Ht 67.0 in | Wt 372.8 lb

## 2020-06-24 DIAGNOSIS — R1011 Right upper quadrant pain: Secondary | ICD-10-CM | POA: Diagnosis not present

## 2020-06-24 DIAGNOSIS — R1013 Epigastric pain: Secondary | ICD-10-CM

## 2020-06-24 NOTE — Progress Notes (Signed)
Rutherford Nail as a Education administrator for [employer], NP.,have documented all relevant documentation on the behalf of [employer], NP,as directed by  Bary Castilla, NP while in the presence of Bary Castilla, NP. This visit occurred during the SARS-CoV-2 public health emergency.  Safety protocols were in place, including screening questions prior to the visit, additional usage of staff PPE, and extensive cleaning of exam room while observing appropriate contact time as indicated for disinfecting solutions.  Subjective:     Patient ID: [name] Wright , female    DOB: Oct 08, 1965 , 55 y.o.   MRN: [id number]   Chief Complaint  Patient presents with  . Abdominal Pain    HPI  Pt is here today for abdominal pain. She has had this same problem in the past. She has had pain, nausea. She fell first of the year. She bruised her pelvis and had a concussion. There was pain around her back. She has fullness feeling and also after she eats she feels sick/nausous.   Abdominal Pain This is a recurrent problem. The current episode started 1 to 4 weeks ago. The problem occurs daily. The pain is located in the RUQ and generalized abdominal region. The quality of the pain is aching and cramping. The abdominal pain radiates to the epigastric region. Associated symptoms include nausea and vomiting. Pertinent negatives include no fever or headaches. The pain is aggravated by eating and vomiting. Prior diagnostic workup includes GI consult.     Past Medical History:  Diagnosis Date  . Acute cystitis   . Allergic rhinitis   . Childhood asthma    no problems as adult - no inhaler  . Chronic insomnia   . Chronic pain due to trauma 02/2001   closed head trauma - Followed by Dr [facility]  . Complication of anesthesia   . Dyspepsia   . Head trauma 02/24/2001   closed  . History of kidney stones    passed stone - no surgery required  . Hypothyroidism   .  PONV (postoperative nausea and vomiting)   . Pre-diabetes   . SVD (spontaneous vaginal delivery)    fetal demise at 5 months     Family History  Problem Relation Age of Onset  . Heart Problems Father   . Cancer Maternal Grandmother        ovarion  . Multiple sclerosis Mother      Current Outpatient Medications:  .  albuterol (PROAIR HFA) 108 (90 Base) MCG/ACT inhaler, Inhale 2 puffs into the lungs every 4 (four) hours as needed for wheezing or shortness of breath., Disp: 1 Inhaler, Rfl: 1 .  baclofen (LIORESAL) 10 MG tablet, Take 5-10 mg by mouth every 8 (eight) hours as needed for muscle spasms. , Disp: , Rfl:  .  cetirizine (ZYRTEC) 10 MG tablet, Take 10 mg by mouth daily., Disp: , Rfl:  .  Cholecalciferol (VITAMIN D) 125 MCG (5000 UT) CAPS, One capsule po daily, Disp: 90 capsule, Rfl: 2 .  diazepam (VALIUM) 10 MG tablet, Take 10 mg by mouth 4 (four) times daily as needed for anxiety., Disp: , Rfl:  .  diphenhydrAMINE (BENADRYL) 25 mg capsule, Take 25 mg by mouth every 6 (six) hours as needed for allergies. , Disp: , Rfl:  .  fluticasone (FLONASE) 50 MCG/ACT nasal spray, Place into both nostrils daily., Disp: , Rfl:  .  ibuprofen (ADVIL,MOTRIN) 200 MG tablet, Take 800 mg by mouth every 6 (six) hours as needed  for headache or moderate pain. , Disp: , Rfl:  .  levocetirizine (XYZAL) 5 MG tablet, TAKE 1 TABLET BY MOUTH ONCE DAILY IN THE EVENING, Disp: 90 tablet, Rfl: 1 .  levonorgestrel (MIRENA, 52 MG,) 20 MCG/24HR IUD, Mirena 20 mcg/24 hours (6 yrs) 52 mg intrauterine device  Take 1 device by intrauterine route., Disp: , Rfl:  .  levothyroxine (SYNTHROID) 100 MCG tablet, TAKE 1 TABLET BY MOUTH BEFORE BREAKFAST, Disp: 90 tablet, Rfl: 0 .  meclizine (ANTIVERT) 25 MG tablet, 1/2 tab daily prn, Disp: , Rfl:  .  morphine (MSIR) 15 MG tablet, Take 15 mg by mouth 4 (four) times daily as needed for severe pain. , Disp: , Rfl:  .  Multiple Minerals-Vitamins (CALCIUM-MAGNESIUM-ZINC-D3 PO), Take 1  tablet by mouth in the morning, at noon, and at bedtime., Disp: , Rfl:  .  promethazine (PHENERGAN) 25 MG tablet, Take 1 tablet (25 mg total) by mouth every 6 (six) hours as needed for nausea or vomiting., Disp: 12 tablet, Rfl: 0 .  RESTASIS 0.05 % ophthalmic emulsion, 1 drop 2 (two) times daily., Disp: , Rfl:  .  RYVENT 6 MG TABS, Take 1 tablet by mouth twice daily, Disp: 30 tablet, Rfl: 0 .  tiZANidine (ZANAFLEX) 2 MG tablet, Take 1 tablet by mouth 3 (three) times daily., Disp: , Rfl:  .  traMADol (ULTRAM) 50 MG tablet, Take 1 tablet (50 mg total) by mouth every 6 (six) hours as needed., Disp: 120 tablet, Rfl: 0   Allergies  Allergen Reactions  . Doxycycline Anaphylaxis, Diarrhea and Nausea And Vomiting    headache  . Oxycodone-Acetaminophen Hives and Nausea And Vomiting  . Prochlorperazine Edisylate Other (See Comments)    Hyper and jitteriness  . Amoxicillin Rash and Other (See Comments)    Has patient had a PCN reaction causing immediate rash, facial/tongue/throat swelling, SOB or lightheadedness with hypotension: Unknown Has patient had a PCN reaction causing severe rash involving mucus membranes or skin necrosis: Unknown Has patient had a PCN reaction that required hospitalization: No Has patient had a PCN reaction occurring within the last 10 years: Yes If all of the above answers are "NO", then may proceed with Cephalosporin use.   . Compazine Other (See Comments)    Hyper/jitters/blotches  . Eszopiclone Other (See Comments)    Metallic taste and became un effective   . Keppra [Levetiracetam] Other (See Comments)    hyperactive  . Latex   . Levetiracetam Other (See Comments)    Jitters/hyper  . Neurontin [Gabapentin] Nausea And Vomiting  . Propoxyphene Hives  . Trileptal [Oxcarbazepine] Other (See Comments)    REACTION: immune system suppressed  . Bactrim [Sulfamethoxazole-Trimethoprim] Rash  . Bupropion Rash  . Codeine Rash  . Hydrocodone-Acetaminophen Rash  .  Moxifloxacin Swelling and Rash  . Propoxyphene N-Acetaminophen Nausea And Vomiting and Rash  . Vicodin [Hydrocodone-Acetaminophen] Rash     Review of Systems  Constitutional: Negative.  Negative for fatigue and fever.  HENT: Negative.   Respiratory: Negative for cough, chest tightness and shortness of breath.   Cardiovascular: Negative for chest pain and palpitations.  Gastrointestinal: Positive for abdominal pain, nausea and vomiting.  Endocrine: Negative for polydipsia, polyphagia and polyuria.  Musculoskeletal: Negative.   Skin: Negative.   Neurological: Negative for dizziness and headaches.  Psychiatric/Behavioral: Negative.      Today's Vitals   06/24/20 1010  Weight: (!) 372 lb 12.8 oz (169.1 kg)  Height: _0  (1.702 m)  PainSc: 6  Body mass index is 58.39 kg/m.   Objective:  Physical Exam Constitutional:      Appearance: She is well-developed. She is obese.  HENT:     Head: Normocephalic and atraumatic.  Cardiovascular:     Rate and Rhythm: Normal rate and regular rhythm.     Heart sounds: Normal heart sounds. No murmur heard.   Pulmonary:     Effort: Pulmonary effort is normal. No respiratory distress.     Breath sounds: Normal breath sounds. No wheezing.  Abdominal:     General: Bowel sounds are decreased.     Palpations: Abdomen is soft.     Tenderness: There is generalized abdominal tenderness and tenderness in the right upper quadrant and epigastric area. There is rebound. There is no guarding. Negative signs include Murphy's sign.     Comments: Difficult to assess organomegaly due to body habitus  Skin:    Capillary Refill: Capillary refill takes less than 2 seconds.  Neurological:     Mental Status: She is alert and oriented to person, place, and time.  Psychiatric:        Mood and Affect: Mood normal.        Behavior: Behavior normal.         Assessment And Plan:     1. Right upper quadrant abdominal pain -She has having same issues that  she was having before 8/21. Suggested symptoms of gallbladder. [place] of RUQ showed limited visualization of the gallbladder. Will refer to GI for further evaluation.  - CBC no Diff - CMP14+EGFR - Lipase - Amylase - Ambulatory referral to Gastroenterology  2. Epigastric pain -Will check amylase/lipase levels and refer to GI  - Lipase - Amylase   Follow up: If symptoms get worse, patient was instructed to go the ED.   Patient was given opportunity to ask questions. Patient verbalized understanding of the plan and was able to repeat key elements of the plan. All questions were answered to their satisfaction.  Bary Castilla, NP   I, Bary Castilla, NP, have reviewed all documentation for this visit. The documentation on 06/24/20 for the exam, diagnosis, procedures, and orders are all accurate and complete.   IF YOU HAVE BEEN REFERRED TO A SPECIALIST, IT MAY TAKE 1-2 WEEKS TO SCHEDULE/PROCESS THE REFERRAL. IF YOU HAVE NOT HEARD FROM US/SPECIALIST IN TWO WEEKS, PLEASE GIVE [place] A CALL AT [phone].   THE PATIENT IS ENCOURAGED TO PRACTICE SOCIAL DISTANCING DUE TO THE COVID-19 PANDEMIC.

## 2020-06-25 LAB — CMP14+EGFR
ALT: 21 IU/L (ref 0–32)
AST: 22 IU/L (ref 0–40)
Albumin/Globulin Ratio: 1.1 — ABNORMAL LOW (ref 1.2–2.2)
Albumin: 4.1 g/dL (ref 3.8–4.9)
Alkaline Phosphatase: 115 IU/L (ref 44–121)
BUN/Creatinine Ratio: 14 (ref 9–23)
BUN: 10 mg/dL (ref 6–24)
Bilirubin Total: 0.4 mg/dL (ref 0.0–1.2)
CO2: 22 mmol/L (ref 20–29)
Calcium: 9.3 mg/dL (ref 8.7–10.2)
Chloride: 96 mmol/L (ref 96–106)
Creatinine, Ser: 0.73 mg/dL (ref 0.57–1.00)
Globulin, Total: 3.6 g/dL (ref 1.5–4.5)
Glucose: 165 mg/dL — ABNORMAL HIGH (ref 65–99)
Potassium: 4.5 mmol/L (ref 3.5–5.2)
Sodium: 136 mmol/L (ref 134–144)
Total Protein: 7.7 g/dL (ref 6.0–8.5)
eGFR: 98 mL/min/{1.73_m2} (ref >59)

## 2020-06-25 LAB — AMYLASE: Amylase: 25 U/L — ABNORMAL LOW (ref 31–110)

## 2020-06-25 LAB — CBC
Hematocrit: 43.8 % (ref 34.0–46.6)
Hemoglobin: 14.2 g/dL (ref 11.1–15.9)
MCH: 28.5 pg (ref 26.6–33.0)
MCHC: 32.4 g/dL (ref 31.5–35.7)
MCV: 88 fL (ref 79–97)
Platelets: 340 10*3/uL (ref 150–450)
RBC: 4.99 x10E6/uL (ref 3.77–5.28)
RDW: 12.3 % (ref 11.7–15.4)
WBC: 7.6 10*3/uL (ref 3.4–10.8)

## 2020-06-25 LAB — LIPASE: Lipase: 14 U/L (ref 14–72)

## 2020-06-29 ENCOUNTER — Encounter: Payer: Self-pay | Admitting: Physician Assistant

## 2020-07-14 ENCOUNTER — Other Ambulatory Visit: Payer: Self-pay

## 2020-07-14 ENCOUNTER — Encounter: Payer: Self-pay | Admitting: Physician Assistant

## 2020-07-14 ENCOUNTER — Other Ambulatory Visit (INDEPENDENT_AMBULATORY_CARE_PROVIDER_SITE_OTHER): Payer: Medicare Other

## 2020-07-14 ENCOUNTER — Telehealth: Payer: Self-pay | Admitting: Physician Assistant

## 2020-07-14 ENCOUNTER — Ambulatory Visit: Payer: Medicare Other | Admitting: Physician Assistant

## 2020-07-14 VITALS — BP 148/96 | HR 80 | Ht 64.5 in | Wt 374.2 lb

## 2020-07-14 DIAGNOSIS — R1011 Right upper quadrant pain: Secondary | ICD-10-CM

## 2020-07-14 DIAGNOSIS — R195 Other fecal abnormalities: Secondary | ICD-10-CM

## 2020-07-14 DIAGNOSIS — R112 Nausea with vomiting, unspecified: Secondary | ICD-10-CM

## 2020-07-14 LAB — CBC WITH DIFFERENTIAL/PLATELET
Basophils Absolute: 0.1 10*3/uL (ref 0.0–0.1)
Basophils Relative: 1.1 % (ref 0.0–3.0)
Eosinophils Absolute: 0.3 10*3/uL (ref 0.0–0.7)
Eosinophils Relative: 3.4 % (ref 0.0–5.0)
HCT: 40.7 % (ref 36.0–46.0)
Hemoglobin: 13.7 g/dL (ref 12.0–15.0)
Lymphocytes Relative: 25.6 % (ref 12.0–46.0)
Lymphs Abs: 2 10*3/uL (ref 0.7–4.0)
MCHC: 33.6 g/dL (ref 30.0–36.0)
MCV: 88.4 fl (ref 78.0–100.0)
Monocytes Absolute: 0.5 10*3/uL (ref 0.1–1.0)
Monocytes Relative: 6.8 % (ref 3.0–12.0)
Neutro Abs: 5 10*3/uL (ref 1.4–7.7)
Neutrophils Relative %: 63.1 % (ref 43.0–77.0)
Platelets: 283 10*3/uL (ref 150.0–400.0)
RBC: 4.61 Mil/uL (ref 3.87–5.11)
RDW: 13.2 % (ref 11.5–15.5)
WBC: 7.9 10*3/uL (ref 4.0–10.5)

## 2020-07-14 LAB — COMPREHENSIVE METABOLIC PANEL
ALT: 15 U/L (ref 0–35)
AST: 13 U/L (ref 0–37)
Albumin: 3.8 g/dL (ref 3.5–5.2)
Alkaline Phosphatase: 96 U/L (ref 39–117)
BUN: 12 mg/dL (ref 6–23)
CO2: 29 mEq/L (ref 19–32)
Calcium: 9.1 mg/dL (ref 8.4–10.5)
Chloride: 103 mEq/L (ref 96–112)
Creatinine, Ser: 0.68 mg/dL (ref 0.40–1.20)
GFR: 99.36 mL/min (ref >60.00)
Glucose, Bld: 149 mg/dL — ABNORMAL HIGH (ref 70–99)
Potassium: 4.1 mEq/L (ref 3.5–5.1)
Sodium: 139 mEq/L (ref 135–145)
Total Bilirubin: 0.4 mg/dL (ref 0.2–1.2)
Total Protein: 7.5 g/dL (ref 6.0–8.3)

## 2020-07-14 LAB — LIPASE: Lipase: 21 U/L (ref 11.0–59.0)

## 2020-07-14 MED ORDER — PROMETHAZINE HCL 25 MG PO TABS: ORAL_TABLET | ORAL | 0 refills | Status: DC

## 2020-07-14 MED ORDER — TRAMADOL HCL 50 MG PO TABS: 50.0000 mg | ORAL_TABLET | Freq: Four times a day (QID) | ORAL | 0 refills | Status: DC | PRN

## 2020-07-14 NOTE — Telephone Encounter (Signed)
Tramadol refaxed

## 2020-07-14 NOTE — Telephone Encounter (Signed)
[employer] called in stated the first order for pain medication, Tramadol, was not signed. Requesting another order to be sent in.

## 2020-07-14 NOTE — Patient Instructions (Addendum)
If you are age 54 or older, your body mass index should be between 23-30. Your Body mass index is 63.25 kg/m. If this is out of the aforementioned range listed, please consider follow up with your Primary Care Provider.  If you are age 78 or younger, your body mass index should be between 19-25. Your Body mass index is 63.25 kg/m. If this is out of the aformentioned range listed, please consider follow up with your Primary Care Provider.   Your provider has requested that you go to the basement level for lab work before leaving today. Press "B" on the elevator. The lab is located at the first door on the left as you exit the elevator.  You have been scheduled for a CT scan of the abdomen and pelvis at [facility] ([address]---this is in the same building as [employer]).   You are scheduled on 07/25/2020 at 3:00 pm. You should arrive 15 minutes prior to your appointment time for registration. Please follow the written instructions below on the day of your exam:  WARNING: IF YOU ARE ALLERGIC TO IODINE/X-RAY DYE, PLEASE NOTIFY RADIOLOGY IMMEDIATELY AT [phone]! YOU WILL BE GIVEN A 13 HOUR PREMEDICATION PREP.  1) Do not eat or drink anything after 11:00 am (4 hours prior to your test) 2) You have been given 2 bottles of oral contrast to drink. The solution may taste better if refrigerated, but do NOT add ice or any other liquid to this solution. Shake well before drinking.    Drink 1 bottle of contrast @ 1:00 pm (2 hours prior to your exam)  Drink 1 bottle of contrast @ 2:00 pm (1 hour prior to your exam)  You may take any medications as prescribed with a small amount of water, if necessary. If you take any of the following medications: METFORMIN, GLUCOPHAGE, GLUCOVANCE, AVANDAMET, RIOMET, FORTAMET, [place] MET, JANUMET, GLUMETZA or METAGLIP, you MAY be asked to HOLD this medication 48 hours AFTER the exam.  The purpose of you drinking the oral contrast is to aid  in the visualization of your intestinal tract. The contrast solution may cause some diarrhea. Depending on your individual set of symptoms, you may also receive an intravenous injection of x-ray contrast/dye. Plan on being at [facility] for 30 minutes or longer, depending on the type of exam you are having performed.  This test typically takes 30-45 minutes to complete.  If you have any questions regarding your exam or if you need to reschedule, you may call the CT department at [phone] between the hours of 8:00 am and 5:00 pm, Monday-Friday. _____________________________________________________________  Continue Promethazine 25 mg 1/2-1 tablet every 8 hours as needed. Continue Tramadol 50 mg 1 tablet every 6 hours as needed for pain. Both have been sent to your pharmacy.  Follow a low fat diet.  Go to the ER if symptoms get worse.  Follow up pending your CT.  Thank you for entrusting me with your care and choosing [facility].  Amy Esterwood, PA-C

## 2020-07-14 NOTE — Progress Notes (Signed)
Subjective:    Patient ID: [name] Wright, female    DOB: October 14, 1965, 55 y.o.   MRN: [id number]  HPI [name] Wright is a pleasant 55 year old white female, new to GI today, referred by Dr. Bryon Lions for right upper quadrant pain and concerns about her gallbladder. Patient says she has had prior GI evaluation remotely while living in [place] and had colonoscopy done in her 73s. She has history of morbid obesity, asthma and is status post appendectomy.  She also relates history of a closed head injury. She says over the past 4 to 6 weeks says she has been having ongoing pain in her right upper abdomen which she describes as a "cranky side".  Couple of weekends ago she had severe symptoms after eating [employer] pizza.  Within a couple of hours she was having severe right upper quadrant pain radiating around into her back and then up into her shoulder blade on the right.  Ever since then she has had fairly constant nausea somewhat worse after eating.  She has been taking Phenergan on a regular basis.  She says her discomfort/pain in the right upper quadrant has been fairly constant since then and varying in intensity.  She has not had any associated fever or chills, diarrhea no diarrhea melena or hematochezia. She has had symptoms that she feels are secondary to her gallbladder in past years and had undergone evaluation in 2014 and in 2016. No regular EtOH, no regular NSAIDs. She last had imaging with ultrasound in August 2021 which showed the gallbladder to be partially visualized, CBD not seen moderate hepatic steatosis small left hepatic lobe Read as markedly limited exam due to body habitus.  Prior CT in 2016 with diffuse fatty infiltration of the liver, gallbladder only minimally distended no wall thickening and no stones noted no pericholecystic inflammation and no ductal dilation.  Patient also did a recent Cologuard stool DNA test in December 2021 which was positive.  Review of  Systems Pertinent positive and negative review of systems were noted in the above HPI section.  All other review of systems was otherwise negative.  Outpatient Encounter Medications as of 07/14/2020  Medication Sig  . albuterol (PROAIR HFA) 108 (90 Base) MCG/ACT inhaler Inhale 2 puffs into the lungs every 4 (four) hours as needed for wheezing or shortness of breath.  . baclofen (LIORESAL) 10 MG tablet Take 5-10 mg by mouth every 8 (eight) hours as needed for muscle spasms.   . cetirizine (ZYRTEC) 10 MG tablet Take 10 mg by mouth daily.  . Cholecalciferol (VITAMIN D) 125 MCG (5000 UT) CAPS One capsule po daily  . diazepam (VALIUM) 10 MG tablet Take 10 mg by mouth 4 (four) times daily as needed for anxiety.  . diphenhydrAMINE (BENADRYL) 25 mg capsule Take 25 mg by mouth every 6 (six) hours as needed for allergies.   . fluticasone (FLONASE) 50 MCG/ACT nasal spray Place into both nostrils daily.  Marland Kitchen ibuprofen (ADVIL,MOTRIN) 200 MG tablet Take 800 mg by mouth every 6 (six) hours as needed for headache or moderate pain.   Marland Kitchen levocetirizine (XYZAL) 5 MG tablet TAKE 1 TABLET BY MOUTH ONCE DAILY IN THE EVENING  . levonorgestrel (MIRENA, 52 MG,) 20 MCG/24HR IUD by Intrauterine route once.  Marland Kitchen levothyroxine (SYNTHROID) 100 MCG tablet TAKE 1 TABLET BY MOUTH BEFORE BREAKFAST  . meclizine (ANTIVERT) 25 MG tablet 1/2 tab daily prn  . morphine (MSIR) 15 MG tablet Take 15 mg by mouth 4 (four) times daily as  needed for severe pain.   . Multiple Minerals-Vitamins (CALCIUM-MAGNESIUM-ZINC-D3 PO) Take 1 tablet by mouth in the morning, at noon, and at bedtime.  . RESTASIS 0.05 % ophthalmic emulsion 1 drop 2 (two) times daily.  Marland Kitchen RYVENT 6 MG TABS Take 1 tablet by mouth twice daily  . tiZANidine (ZANAFLEX) 2 MG tablet Take 1 tablet by mouth as needed.  . [DISCONTINUED] promethazine (PHENERGAN) 25 MG tablet Take 1 tablet (25 mg total) by mouth every 6 (six) hours as needed for nausea or vomiting.  . [DISCONTINUED] traMADol  (ULTRAM) 50 MG tablet Take 1 tablet (50 mg total) by mouth every 6 (six) hours as needed.  . promethazine (PHENERGAN) 25 MG tablet Take 1/2 - 1 tablet every 8 hours as needed for nausea  . traMADol (ULTRAM) 50 MG tablet Take 1 tablet (50 mg total) by mouth every 6 (six) hours as needed.   No facility-administered encounter medications on file as of 07/14/2020.   Allergies  Allergen Reactions  . Doxycycline Anaphylaxis, Diarrhea and Nausea And Vomiting    headache  . Oxycodone-Acetaminophen Hives and Nausea And Vomiting  . Prochlorperazine Edisylate Other (See Comments)    Hyper and jitteriness  . Amoxicillin Rash and Other (See Comments)    Has patient had a PCN reaction causing immediate rash, facial/tongue/throat swelling, SOB or lightheadedness with hypotension: Unknown Has patient had a PCN reaction causing severe rash involving mucus membranes or skin necrosis: Unknown Has patient had a PCN reaction that required hospitalization: No Has patient had a PCN reaction occurring within the last 10 years: Yes If all of the above answers are "NO", then may proceed with Cephalosporin use.   . Compazine Other (See Comments)    Hyper/jitters/blotches  . Eszopiclone Other (See Comments)    Metallic taste and became un effective   . Keppra [Levetiracetam] Other (See Comments)    hyperactive  . Latex   . Levetiracetam Other (See Comments)    Jitters/hyper  . Neurontin [Gabapentin] Nausea And Vomiting  . Propoxyphene Hives  . Trileptal [Oxcarbazepine] Other (See Comments)    REACTION: immune system suppressed  . Bactrim [Sulfamethoxazole-Trimethoprim] Rash  . Bupropion Rash  . Codeine Rash  . Hydrocodone-Acetaminophen Rash  . Moxifloxacin Swelling and Rash  . Propoxyphene N-Acetaminophen Nausea And Vomiting and Rash  . Vicodin [Hydrocodone-Acetaminophen] Rash   Patient Active Problem List   Diagnosis Date Noted  . Class 3 severe obesity in adult, unspecified BMI, unspecified obesity  type, unspecified whether serious comorbidity present ([place]) 08/13/2019  . Nasal congestion 03/10/2019  . Sinus pressure 03/10/2019  . Hyperglycemia 01/22/2019  . Hypertriglyceridemia 01/22/2019  . Encounter for preventative adult health care exam with abnormal findings 09/01/2015  . Sinusitis, chronic 09/01/2015  . Cough 09/01/2015  . RUQ pain 10/27/2014  . Urinary incontinence 09/17/2013  . Acute sinus infection 10/16/2012  . Elevated BP 07/29/2012  . Fatigue 07/10/2012  . Leg cramps 04/15/2011  . Edema 12/21/2010  . Chronic pain due to trauma 03/15/2007  . ASTHMA, CHILDHOOD 03/15/2007  . INSOMNIA, CHRONIC 11/01/2006  . ALLERGIC RHINITIS 11/01/2006  . DYSPEPSIA, CHRONIC 11/01/2006  . HEAD TRAUMA, CLOSED 11/01/2006   Social History   Socioeconomic History  . Marital status: Married    Spouse name: Not on file  . Number of children: 0  . Years of education: Not on file  . Highest education level: Not on file  Occupational History  . Occupation: disability  Tobacco Use  . Smoking status: Never Smoker  .  Smokeless tobacco: Never Used  Vaping Use  . Vaping Use: Never used  Substance and Sexual Activity  . Alcohol use: No    Comment: "1 drink/year"  . Drug use: Yes    Types: Morphine    Comment: Morphine 15 mg tab 4xdaily prn  . Sexual activity: Not Currently    Birth control/protection: None  Other Topics Concern  . 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 Difficulty of Paying Living Expenses: Not hard at all  Food Insecurity: No Food Insecurity  . Worried About Charity fundraiser in the Last Year: Never true  . Ran Out of Food in the Last Year: Never true  Transportation Needs: No Transportation Needs  . Lack of  Transportation (Medical): No  . Lack of Transportation (Non-Medical): No  Physical Activity: Inactive  . Days of Exercise per Week: 0 days  . Minutes of Exercise per Session: 0 min  Stress: No Stress Concern Present  . Feeling of Stress : Only a little  Social Connections: Not on file  Intimate Partner Violence: Not on file    Ms. Carden's family history includes Heart Problems in her father; Lung cancer in her maternal grandfather; Multiple sclerosis in her mother; Ovarian cancer in her maternal grandmother.      Objective:    Vitals:   07/14/20 1026  BP: (!) 148/96  Pulse: 80    Physical Exam Well-developed well-nourished  WF  in no acute distress.  Height, [id number]  BMI 63  HEENT; nontraumatic normocephalic, EOMI, PE R LA, sclera anicteric. Oropharynx; not examined today Neck; supple, no JVD Cardiovascular; regular rate and rhythm with S1-S2, no murmur rub or gallop Pulmonary; Clear bilaterally Abdomen; soft, morbidly obese, she is tender in the right upper quadrant, and laterally, no rebound  no palpable mass or hepatosplenomegaly, bowel sounds are active Rectal; not done today Skin; benign exam, no jaundice rash or appreciable lesions Extremities; no clubbing cyanosis or edema skin warm and dry Neuro/Psych; alert and oriented x4, grossly nonfocal mood and affect appropriate       Assessment & Plan:   #48  54 year old white female with several week history of right upper quadrant pain with initial episode occurring after eating pizza and developing severe right upper quadrant pain radiating around into the back and into the shoulder blade thereafter associated with nausea.  She has had nausea and persistent pain/discomfort since at times worse postprandially.  Symptoms are very concerning for biliary colic, possible cholecystitis. Prior ultrasound August 2021 very limited by body habitus but no gallstones noted.   consider gastropathy or peptic ulcer  disease  #2 status post appendectomy #3.  Morbid obesity-BMI 63 #4.  Hypothyroidism #5.  Chronic pain syndrome with intermittent narcotic use  #6+ Cologuard stool DNA test  Plan; CBC with differential, c-Met, lipase Schedule for CT of the abdomen and pelvis with contrast Low-fat diet Have refilled Phenergan 25 mg every 6 hours as needed for nausea Tramadol 50 mg every 6 hours as needed for pain #40 and no refills Patient was advised to go to the emergency room should she have any acute worsening of symptoms or prolonged episode that does not resolve. We discussed colonoscopy which will eventually  need to be done within the next few months after her acute right upper quadrant pain is sorted out. Patient will be established with Dr. Lucille Passy PA-C 07/14/2020   Cc: [name], MD

## 2020-07-20 ENCOUNTER — Ambulatory Visit: Payer: Medicare Other | Admitting: Internal Medicine

## 2020-07-25 ENCOUNTER — Other Ambulatory Visit: Payer: Medicare Other

## 2020-07-26 ENCOUNTER — Other Ambulatory Visit: Payer: Self-pay

## 2020-07-26 ENCOUNTER — Ambulatory Visit (INDEPENDENT_AMBULATORY_CARE_PROVIDER_SITE_OTHER)
Admission: RE | Admit: 2020-07-26 | Discharge: 2020-07-26 | Disposition: A | Discharge status: Home / Self Care | Payer: Medicare Other | Source: Ambulatory Visit | Attending: Physician Assistant | Admitting: Physician Assistant

## 2020-07-26 DIAGNOSIS — R112 Nausea with vomiting, unspecified: Secondary | ICD-10-CM | POA: Diagnosis not present

## 2020-07-26 DIAGNOSIS — R1011 Right upper quadrant pain: Secondary | ICD-10-CM | POA: Diagnosis not present

## 2020-07-26 DIAGNOSIS — R1110 Vomiting, unspecified: Secondary | ICD-10-CM | POA: Diagnosis not present

## 2020-07-26 DIAGNOSIS — K760 Fatty (change of) liver, not elsewhere classified: Secondary | ICD-10-CM | POA: Diagnosis not present

## 2020-07-26 MED ORDER — IOHEXOL 300 MG/ML  SOLN
100.0000 mL | Freq: Once | INTRAMUSCULAR | Status: AC | PRN
  Administered 2020-07-26: 100 mL via INTRAVENOUS

## 2020-07-26 NOTE — Progress Notes (Signed)
Addendum: Reviewed and agree with assessment and management plan. CT scan abdomen pelvis is scheduled for today We need to get colonoscopy scheduled as well given positive Cologuard Bernita Beckstrom, Lajuan Lines, MD

## 2020-07-28 ENCOUNTER — Other Ambulatory Visit: Payer: Self-pay

## 2020-07-28 ENCOUNTER — Telehealth: Payer: Self-pay

## 2020-07-28 DIAGNOSIS — R112 Nausea with vomiting, unspecified: Secondary | ICD-10-CM

## 2020-07-28 DIAGNOSIS — R1011 Right upper quadrant pain: Secondary | ICD-10-CM

## 2020-07-28 NOTE — Telephone Encounter (Signed)
[name] Wright July 12 is the first hospital block opening for  Dr Hilarie Fredrickson. Is this okay?

## 2020-07-28 NOTE — Telephone Encounter (Signed)
Patient advised of the plan. She agrees to this. Radiology order entered. Message to radiology schedulers. Case entered for the July opening at [facility]. Will move it when a sooner opening is found.

## 2020-07-28 NOTE — Telephone Encounter (Addendum)
Spoke with the patient. Advised her the CT scan shows a normal-appearing gallbladder, no signs of cholecystitis or gallbladder wall thickening. This is good as it indicates that the gallbladder is most likely not the cause of the symptoms.She has diffuse fatty infiltration of the liver, and a normal pancreas. The study does mention that the IUD is in an abnormal position in the cervix and lower uterine segment. She sees Dr Everlene Farrier at [facility]. I will fax the report to his office. She will make a follow up phone call to his office. She continues to have the same problems. She has make changes in her diet and is very focoused on avoiding fatty rich foods. She continues to have the floating greasy stools that occur urgently. Less nausea, but feels gassy. I will inquire on a date at [facility] for a double procedure.

## 2020-08-01 NOTE — Telephone Encounter (Signed)
I am looking for hospital procedure time to accommodate the EGD and colonoscopy this patient needs EGD for RUQ pain and colonoscopy for + Cologuard in the setting of BMI > 50.  I do not have hospital availability until July 2022 and would like to find a sooner appt.  Dr. Bryan Lemma would you be able to accommodate an EGD and colonoscopy on 08/17/20 during your block.  If not (which is understandable) I will continue to look for hospital time.    Thanks  [employer]

## 2020-08-02 NOTE — Telephone Encounter (Signed)
Spoke with patient to reschedule EGD and colonoscopy at [facility]. Patient states that she will be at [facility] on 08/17/20 and will not be able to reschedule that appointment.   Patient has been scheduled for EGD/colon with Dr. Marlynn Perking at [facility] on Thursday, 08/25/20 at 8:30 AM, arriving at 7 AM. Pre-visit is scheduled for Thursday, 08/18/20 at 8 AM. [place] test is Monday, 08/22/20 at 9:30 AM. Advised patient that I will also send her appointment information via My Chart. Patient verbalized understanding of all information and had no concerns at the end of the call.

## 2020-08-06 ENCOUNTER — Other Ambulatory Visit: Payer: Self-pay | Admitting: Physician Assistant

## 2020-08-08 NOTE — Telephone Encounter (Signed)
Do you want to refill this ? 

## 2020-08-08 NOTE — Telephone Encounter (Signed)
Ok to refill #30/0

## 2020-08-09 ENCOUNTER — Other Ambulatory Visit: Payer: Self-pay | Admitting: Nurse Practitioner

## 2020-08-09 ENCOUNTER — Other Ambulatory Visit: Payer: Self-pay | Admitting: Physician Assistant

## 2020-08-17 ENCOUNTER — Ambulatory Visit: Payer: Medicare Other | Admitting: Internal Medicine

## 2020-08-17 ENCOUNTER — Ambulatory Visit: Payer: Medicare Other

## 2020-08-17 DIAGNOSIS — Z5181 Encounter for therapeutic drug level monitoring: Secondary | ICD-10-CM | POA: Diagnosis not present

## 2020-08-17 DIAGNOSIS — G894 Chronic pain syndrome: Secondary | ICD-10-CM | POA: Diagnosis not present

## 2020-08-17 DIAGNOSIS — Z79891 Long term (current) use of opiate analgesic: Secondary | ICD-10-CM | POA: Diagnosis not present

## 2020-08-17 DIAGNOSIS — R519 Headache, unspecified: Secondary | ICD-10-CM | POA: Diagnosis not present

## 2020-08-18 ENCOUNTER — Ambulatory Visit (AMBULATORY_SURGERY_CENTER): Payer: Self-pay | Admitting: *Deleted

## 2020-08-18 ENCOUNTER — Other Ambulatory Visit: Payer: Self-pay

## 2020-08-18 ENCOUNTER — Encounter: Payer: Self-pay | Admitting: *Deleted

## 2020-08-18 VITALS — Ht 64.5 in | Wt 370.0 lb

## 2020-08-18 DIAGNOSIS — R1011 Right upper quadrant pain: Secondary | ICD-10-CM

## 2020-08-18 DIAGNOSIS — R195 Other fecal abnormalities: Secondary | ICD-10-CM

## 2020-08-18 MED ORDER — SUTAB 1479-225-188 MG PO TABS: 1.0000 | ORAL_TABLET | ORAL | 0 refills | Status: DC

## 2020-08-18 NOTE — Progress Notes (Signed)
Patient & spouse is here in-person for PV. Patient denies any allergies to eggs or soy. Patient denies any problems with anesthesia/sedation. Patient denies any oxygen use at home. Patient denies taking any diet/weight loss medications or blood thinners. Patient is not being treated for MRSA or C-diff. Patient is aware of our care-partner policy and [id number] safety protocol. EMMI education assigned to the patient for the procedure, sent to [place].  Patient denies any changes in medical hx since GI OV except IUD removed.

## 2020-08-22 ENCOUNTER — Other Ambulatory Visit (HOSPITAL_COMMUNITY)
Admission: RE | Admit: 2020-08-22 | Discharge: 2020-08-22 | Disposition: A | Discharge status: Home / Self Care | Payer: Medicare Other | Source: Ambulatory Visit | Attending: Gastroenterology | Admitting: Gastroenterology

## 2020-08-22 ENCOUNTER — Other Ambulatory Visit: Payer: Self-pay

## 2020-08-22 ENCOUNTER — Encounter (HOSPITAL_COMMUNITY): Payer: Self-pay | Admitting: Gastroenterology

## 2020-08-22 ENCOUNTER — Other Ambulatory Visit (HOSPITAL_COMMUNITY): Payer: Medicare Other

## 2020-08-22 DIAGNOSIS — Z01812 Encounter for preprocedural laboratory examination: Secondary | ICD-10-CM | POA: Diagnosis not present

## 2020-08-22 DIAGNOSIS — Z20822 Contact with and (suspected) exposure to covid-19: Secondary | ICD-10-CM | POA: Diagnosis not present

## 2020-08-22 LAB — SARS CORONAVIRUS 2 (TAT 6-24 HRS): SARS Coronavirus 2: NEGATIVE

## 2020-08-23 ENCOUNTER — Ambulatory Visit (HOSPITAL_COMMUNITY)
Admission: RE | Admit: 2020-08-23 | Discharge: 2020-08-23 | Disposition: A | Discharge status: Home / Self Care | Payer: Medicare Other | Source: Ambulatory Visit | Attending: Physician Assistant | Admitting: Physician Assistant

## 2020-08-23 DIAGNOSIS — R1011 Right upper quadrant pain: Secondary | ICD-10-CM | POA: Diagnosis not present

## 2020-08-23 DIAGNOSIS — R112 Nausea with vomiting, unspecified: Secondary | ICD-10-CM

## 2020-08-23 MED ORDER — TECHNETIUM TC 99M MEBROFENIN IV KIT
5.5000 | PACK | Freq: Once | INTRAVENOUS | Status: AC
  Administered 2020-08-23: 5.5 via INTRAVENOUS

## 2020-08-24 ENCOUNTER — Encounter: Payer: Self-pay | Admitting: Internal Medicine

## 2020-08-24 NOTE — Anesthesia Preprocedure Evaluation (Addendum)
Anesthesia Evaluation  Patient identified by MRN, date of birth, ID band Patient awake    Reviewed: Allergy & Precautions, NPO status , Patient's Chart, lab work & pertinent test results  Airway Mallampati: II  TM Distance: >3 FB Neck ROM: Full    Dental no notable dental hx. (+) Teeth Intact, Dental Advisory Given   Pulmonary asthma ,    Pulmonary exam normal breath sounds clear to auscultation       Cardiovascular negative cardio ROS Normal cardiovascular exam Rhythm:Regular Rate:Normal     Neuro/Psych negative neurological ROS  negative psych ROS   GI/Hepatic Neg liver ROS,   Endo/Other  Hypothyroidism Morbid obesity  Renal/GU negative Renal ROS     Musculoskeletal negative musculoskeletal ROS (+)   Abdominal (+) + obese,   Peds  Hematology   Anesthesia Other Findings All: see list  Reproductive/Obstetrics                            Anesthesia Physical Anesthesia Plan  ASA: III  Anesthesia Plan: MAC   Post-op Pain Management:    Induction:   PONV Risk Score and Plan: Treatment may vary due to age or medical condition  Airway Management Planned: Natural Airway  Additional Equipment: None  Intra-op Plan:   Post-operative Plan:   Informed Consent: I have reviewed the patients History and Physical, chart, labs and discussed the procedure including the risks, benefits and alternatives for the proposed anesthesia with the patient or authorized representative who has indicated his/her understanding and acceptance.     Dental advisory given  Plan Discussed with: CRNA  Anesthesia Plan Comments: (Abdominal pain for screening colonoscopy and egd)       Anesthesia Quick Evaluation

## 2020-08-25 ENCOUNTER — Encounter: Payer: Self-pay | Admitting: Internal Medicine

## 2020-08-25 ENCOUNTER — Other Ambulatory Visit: Payer: Self-pay

## 2020-08-25 ENCOUNTER — Ambulatory Visit (HOSPITAL_COMMUNITY)
Admission: RE | Admit: 2020-08-25 | Discharge: 2020-08-25 | Disposition: A | Discharge status: Home / Self Care | Payer: Medicare Other | Attending: Gastroenterology | Admitting: Gastroenterology

## 2020-08-25 ENCOUNTER — Encounter (HOSPITAL_COMMUNITY)
Admission: RE | Disposition: A | Discharge status: Home / Self Care | Payer: Self-pay | Source: Home / Self Care | Attending: Gastroenterology

## 2020-08-25 ENCOUNTER — Ambulatory Visit (HOSPITAL_COMMUNITY): Payer: Medicare Other | Admitting: Anesthesiology

## 2020-08-25 ENCOUNTER — Encounter (HOSPITAL_COMMUNITY): Payer: Self-pay | Admitting: Gastroenterology

## 2020-08-25 DIAGNOSIS — J309 Allergic rhinitis, unspecified: Secondary | ICD-10-CM | POA: Diagnosis not present

## 2020-08-25 DIAGNOSIS — K2990 Gastroduodenitis, unspecified, without bleeding: Secondary | ICD-10-CM | POA: Diagnosis not present

## 2020-08-25 DIAGNOSIS — D125 Benign neoplasm of sigmoid colon: Secondary | ICD-10-CM | POA: Diagnosis not present

## 2020-08-25 DIAGNOSIS — D128 Benign neoplasm of rectum: Secondary | ICD-10-CM | POA: Diagnosis not present

## 2020-08-25 DIAGNOSIS — R1011 Right upper quadrant pain: Secondary | ICD-10-CM | POA: Diagnosis not present

## 2020-08-25 DIAGNOSIS — D124 Benign neoplasm of descending colon: Secondary | ICD-10-CM | POA: Diagnosis not present

## 2020-08-25 DIAGNOSIS — Z6841 Body Mass Index (BMI) 40.0 and over, adult: Secondary | ICD-10-CM | POA: Insufficient documentation

## 2020-08-25 DIAGNOSIS — G894 Chronic pain syndrome: Secondary | ICD-10-CM | POA: Insufficient documentation

## 2020-08-25 DIAGNOSIS — K635 Polyp of colon: Secondary | ICD-10-CM | POA: Diagnosis not present

## 2020-08-25 DIAGNOSIS — D122 Benign neoplasm of ascending colon: Secondary | ICD-10-CM

## 2020-08-25 DIAGNOSIS — K760 Fatty (change of) liver, not elsewhere classified: Secondary | ICD-10-CM | POA: Diagnosis not present

## 2020-08-25 DIAGNOSIS — K2970 Gastritis, unspecified, without bleeding: Secondary | ICD-10-CM

## 2020-08-25 DIAGNOSIS — K6289 Other specified diseases of anus and rectum: Secondary | ICD-10-CM | POA: Insufficient documentation

## 2020-08-25 DIAGNOSIS — K621 Rectal polyp: Secondary | ICD-10-CM

## 2020-08-25 DIAGNOSIS — D123 Benign neoplasm of transverse colon: Secondary | ICD-10-CM | POA: Diagnosis not present

## 2020-08-25 DIAGNOSIS — R110 Nausea: Secondary | ICD-10-CM | POA: Diagnosis not present

## 2020-08-25 DIAGNOSIS — R195 Other fecal abnormalities: Secondary | ICD-10-CM | POA: Insufficient documentation

## 2020-08-25 DIAGNOSIS — K2950 Unspecified chronic gastritis without bleeding: Secondary | ICD-10-CM | POA: Insufficient documentation

## 2020-08-25 HISTORY — PX: COLONOSCOPY WITH PROPOFOL: SHX5780

## 2020-08-25 HISTORY — PX: ESOPHAGOGASTRODUODENOSCOPY (EGD) WITH PROPOFOL: SHX5813

## 2020-08-25 HISTORY — PX: BIOPSY: SHX5522

## 2020-08-25 HISTORY — PX: POLYPECTOMY: SHX5525

## 2020-08-25 SURGERY — ESOPHAGOGASTRODUODENOSCOPY (EGD) WITH PROPOFOL
Anesthesia: Monitor Anesthesia Care

## 2020-08-25 MED ORDER — PROPOFOL 500 MG/50ML IV EMUL
INTRAVENOUS | Status: DC | PRN
  Administered 2020-08-25: 100 ug/kg/min via INTRAVENOUS

## 2020-08-25 MED ORDER — PROPOFOL 1000 MG/100ML IV EMUL
INTRAVENOUS | Status: AC
  Filled 2020-08-25: qty 200

## 2020-08-25 MED ORDER — LIDOCAINE 2% (20 MG/ML) 5 ML SYRINGE
INTRAMUSCULAR | Status: DC | PRN
  Administered 2020-08-25: 100 mg via INTRAVENOUS

## 2020-08-25 MED ORDER — PROPOFOL 10 MG/ML IV BOLUS
INTRAVENOUS | Status: DC | PRN
  Administered 2020-08-25: 50 mg via INTRAVENOUS

## 2020-08-25 MED ORDER — PROPOFOL 500 MG/50ML IV EMUL
INTRAVENOUS | Status: AC
  Filled 2020-08-25: qty 50

## 2020-08-25 MED ORDER — SODIUM CHLORIDE 0.9 % IV SOLN: INTRAVENOUS | Status: DC

## 2020-08-25 MED ORDER — ONDANSETRON HCL 4 MG/2ML IJ SOLN
INTRAMUSCULAR | Status: DC | PRN
  Administered 2020-08-25: 4 mg via INTRAVENOUS

## 2020-08-25 MED ORDER — LACTATED RINGERS IV SOLN: INTRAVENOUS | Status: DC

## 2020-08-25 SURGICAL SUPPLY — 24 items

## 2020-08-25 NOTE — Transfer of Care (Signed)
Immediate Anesthesia Transfer of Care Note  Patient: [name] Wright  Procedure(s) Performed: ESOPHAGOGASTRODUODENOSCOPY (EGD) WITH PROPOFOL (N/A ) COLONOSCOPY WITH PROPOFOL (N/A ) BIOPSY POLYPECTOMY  Patient Location: PACU  Anesthesia Type:MAC  Level of Consciousness: sedated  Airway & Oxygen Therapy: Patient Spontanous Breathing and Patient connected to face mask oxygen  Post-op Assessment: Report given to RN and Post -op Vital signs reviewed and stable  Post vital signs: Reviewed and stable  Last Vitals:  Vitals Value Taken Time  BP 144/83 08/25/20 0918  Temp    Pulse 89 08/25/20 0919  Resp 15 08/25/20 0919  SpO2 99 % 08/25/20 0919  Vitals shown include unvalidated device data.  Last Pain:  Vitals:   08/25/20 0728  TempSrc: Oral  PainSc: 0-No pain         Complications: No complications documented.

## 2020-08-25 NOTE — H&P (Signed)
P  Chief Complaint:    RUQ pain, positive Cologuard  HPI:     Patient is a 55 y.o. female presenting to [facility] long hospital Endoscopy Unit for upper endoscopy for evaluation of RUQ pain along with colonoscopy for evaluation of positive Cologuard test.  She was initially evaluated in the GI clinic 07/14/20 as follows: Patient says she has had prior GI evaluation remotely while living in [place] and had colonoscopy done in her 43s. She has history of morbid obesity, asthma and is status post appendectomy.  She also relates history of a closed head injury. She says over the past 4 to 6 weeks says she has been having ongoing pain in her right upper abdomen which she describes as a "cranky side".  Couple of weekends ago she had severe symptoms after eating [employer] pizza.  Within a couple of hours she was having severe right upper quadrant pain radiating around into her back and then up into her shoulder blade on the right.  Ever since then she has had fairly constant nausea somewhat worse after eating.  She has been taking Phenergan on a regular basis.  She says her discomfort/pain in the right upper quadrant has been fairly constant since then and varying in intensity.  She has not had any associated fever or chills, diarrhea no diarrhea melena or hematochezia. She has had symptoms that she feels are secondary to her gallbladder in past years and had undergone evaluation in 2014 and in 2016. No regular EtOH, no regular NSAIDs. She last had imaging with ultrasound in August 2021 which showed the gallbladder to be partially visualized, CBD not seen moderate hepatic steatosis small left hepatic lobe Read as markedly limited exam due to body habitus.  Prior CT in 2016 with diffuse fatty infiltration of the liver, gallbladder only minimally distended no wall thickening and no stones noted no pericholecystic inflammation and no ductal dilation.  Patient also did a recent Cologuard stool  DNA test in December 2021 which was positive.  Since then, she completed CT abdomen/pelvis which was only notable for hepatic steatosis, but otherwise unremarkable.  HIDA scan yesterday was normal.   Review of systems:     No chest pain, no SOB, no fevers, no urinary sx   Past Medical History:  Diagnosis Date  . Acute cystitis   . Allergic rhinitis   . Childhood asthma    no problems as adult - no inhaler  . Chronic insomnia   . Chronic pain due to trauma 02/2001   closed head trauma - Followed by Dr We[facility]  . Complication of anesthesia   . Dyspepsia   . Head trauma 02/24/2001   closed  . History of kidney stones    passed stone - no surgery required  . Hypothyroidism   . PONV (postoperative nausea and vomiting)   . Pre-diabetes   . SVD (spontaneous vaginal delivery)    fetal demise at 5 months    Patient's surgical history, family medical history, social history, medications and allergies were all reviewed in Epic    Current Facility-Administered Medications  Medication Dose Route Frequency Provider Last Rate Last Admin  . 0.9 %  sodium chloride infusion   Intravenous Continuous Hitoshi Werts V, DO      . lactated ringers infusion   Intravenous Continuous Marily Konczal V, DO        Physical Exam:     Temp (!) 97.5 F (36.4 C) (Oral)  Resp 15   Ht 5\' 6"  (1.676 m)   Wt (!) 166.5 kg   LMP 08/04/2020   SpO2 99%   BMI 59.24 kg/m   GENERAL:  Pleasant female in NAD PSYCH: : Cooperative, normal affect EENT:  conjunctiva pink, mucous membranes moist, neck supple without masses CARDIAC:  RRR, no murmur heard, no peripheral edema PULM: Normal respiratory effort, lungs CTA bilaterally, no wheezing ABDOMEN:  Nondistended, soft, nontender. No obvious masses, no hepatomegaly,  normal bowel sounds SKIN:  turgor, no lesions seen Musculoskeletal:  Normal muscle tone, normal strength NEURO: Alert and oriented x 3, no focal neurologic  deficits   IMPRESSION and PLAN:    1) RUQ pain: - EGD today  2) Positive Cologuard test - Colonoscopy today  3) morbid obesity 4) Chronic pain syndrome with intermittent narcotic use -Procedure to be done at [facility] due to elevated periprocedural risks          Lavena Bullion ,DO, FACG 08/25/2020, 8:29 AM

## 2020-08-25 NOTE — Interval H&P Note (Signed)
History and Physical Interval Note:  08/25/2020 8:32 AM  [name] Wright  has presented today for surgery, with the diagnosis of abominal pain screening colonoscopy.  The various methods of treatment have been discussed with the patient and family. After consideration of risks, benefits and other options for treatment, the patient has consented to  Procedure(s): ESOPHAGOGASTRODUODENOSCOPY (EGD) WITH PROPOFOL (N/A) COLONOSCOPY WITH PROPOFOL (N/A) as a surgical intervention.  The patient's history has been reviewed, patient examined, no change in status, stable for surgery.  I have reviewed the patient's chart and labs.  Questions were answered to the patient's satisfaction.     Dominic Pea Sonika Levins

## 2020-08-25 NOTE — Anesthesia Procedure Notes (Signed)
Date/Time: 08/25/2020 8:26 AM Performed by: Talbot Grumbling, CRNA Oxygen Delivery Method: Simple face mask

## 2020-08-25 NOTE — Op Note (Signed)
[facility] Patient Name: [name] Wright Procedure Date: 08/25/2020 MRN: [id number] Attending MD: Gerrit Heck , MD Date of Birth: 06-08-1965 CSN: [id number] Age: 55 Admit Type: Outpatient Procedure:                Upper GI endoscopy Indications:              Abdominal pain in the right upper quadrant, Nausea                           CT abdomen with hepatic steatosis, but otherwise                            unremarkable. HIDA scan normal. Providers:                Gerrit Heck, MD, Benay Pillow, RN, Benetta Spar, Technician Referring MD:              Medicines:                Monitored Anesthesia Care Complications:            No immediate complications. Estimated Blood Loss:     Estimated blood loss was minimal. Procedure:                Pre-Anesthesia Assessment:                           - Prior to the procedure, a History and Physical                            was performed, and patient medications and                            allergies were reviewed. The patient's tolerance of                            previous anesthesia was also reviewed. The risks                            and benefits of the procedure and the sedation                            options and risks were discussed with the patient.                            All questions were answered, and informed consent                            was obtained. Prior Anticoagulants: The patient has                            taken no previous anticoagulant or antiplatelet  agents. ASA Grade Assessment: III - A patient with                            severe systemic disease. After reviewing the risks                            and benefits, the patient was deemed in                            satisfactory condition to undergo the procedure.                           After obtaining informed consent, the endoscope was                             passed under direct vision. Throughout the                            procedure, the patient's blood pressure, pulse, and                            oxygen saturations were monitored continuously. The                            [id number]-H190 ([id number]) Olympus gastroscope was                            introduced through the mouth, and advanced to the                            second part of duodenum. The upper GI endoscopy was                            accomplished without difficulty. The patient                            tolerated the procedure well. Scope In: Scope Out: Findings:      The examined esophagus was normal.      Diffuse mild inflammation characterized by erythema was found in the       gastric fundus, in the gastric body, at the incisura and in the gastric       antrum. Biopsies were taken with a cold forceps for Helicobacter pylori       testing. Estimated blood loss was minimal.      The examined duodenum was normal. Impression:               - Normal esophagus.                           - Gastritis. Biopsied.                           - Normal examined duodenum. Moderate Sedation:      Not Applicable - Patient had care per Anesthesia. Recommendation:           -  Patient has a contact number available for                            emergencies. The signs and symptoms of potential                            delayed complications were discussed with the                            patient. Return to normal activities tomorrow.                            Written discharge instructions were provided to the                            patient.                           - Resume previous diet.                           - Continue present medications.                           - Await pathology results.                           - Colonoscopy today.                           - Follow-up with Amy Esterwood or Dr. Hilarie Fredrickson in the                            GI clinic at  appointment to be scheduled. Procedure Code(s):        --- Professional ---                           [phone], Esophagogastroduodenoscopy, flexible,                            transoral; with biopsy, single or multiple Diagnosis Code(s):        --- Professional ---                           K29.70, Gastritis, unspecified, without bleeding                           R10.11, Right upper quadrant pain                           R11.0, Nausea CPT copyright 2019 American Medical Association. All rights reserved. The codes documented in this report are preliminary and upon coder review may  be revised to meet current compliance requirements. Gerrit Heck, MD 08/25/2020 9:26:20 AM Number of Addenda: 0

## 2020-08-25 NOTE — Anesthesia Postprocedure Evaluation (Signed)
Anesthesia Post Note  Patient: [name] Wright  Procedure(s) Performed: ESOPHAGOGASTRODUODENOSCOPY (EGD) WITH PROPOFOL (N/A ) COLONOSCOPY WITH PROPOFOL (N/A ) BIOPSY POLYPECTOMY     Patient location during evaluation: Endoscopy Anesthesia Type: MAC Level of consciousness: awake and alert Pain management: pain level controlled Vital Signs Assessment: post-procedure vital signs reviewed and stable Respiratory status: spontaneous breathing, nonlabored ventilation, respiratory function stable and patient connected to nasal cannula oxygen Cardiovascular status: blood pressure returned to baseline and stable Postop Assessment: no apparent nausea or vomiting Anesthetic complications: no   No complications documented.  Last Vitals:  Vitals:   08/25/20 0930 08/25/20 0941  BP: 121/68 (!) 152/67  Pulse: 93 81  Resp: 17 16  Temp:  36.6 C  SpO2: 98% 96%    Last Pain:  Vitals:   08/25/20 0941  TempSrc: Oral  PainSc: 0-No pain                 Barnet Glasgow

## 2020-08-25 NOTE — Op Note (Signed)
[facility] Patient Name: [name] Wright Procedure Date: 08/25/2020 MRN: [id number] Attending MD: Gerrit Heck , MD Date of Birth: Jul 18, 1965 CSN: [id number] Age: 55 Admit Type: Outpatient Procedure:                Colonoscopy Indications:              Positive Cologuard test Providers:                Gerrit Heck, MD, Benay Pillow, RN, Benetta Spar, Technician Referring MD:              Medicines:                Monitored Anesthesia Care Complications:            No immediate complications. Estimated Blood Loss:     Estimated blood loss was minimal. Procedure:                Pre-Anesthesia Assessment:                           - Prior to the procedure, a History and Physical                            was performed, and patient medications and                            allergies were reviewed. The patient's tolerance of                            previous anesthesia was also reviewed. The risks                            and benefits of the procedure and the sedation                            options and risks were discussed with the patient.                            All questions were answered, and informed consent                            was obtained. Prior Anticoagulants: The patient has                            taken no previous anticoagulant or antiplatelet                            agents. ASA Grade Assessment: III - A patient with                            severe systemic disease. After reviewing the risks                            and  benefits, the patient was deemed in                            satisfactory condition to undergo the procedure.                           After obtaining informed consent, the colonoscope                            was passed under direct vision. Throughout the                            procedure, the patient's blood pressure, pulse, and                            oxygen  saturations were monitored continuously. The                            [id number]-HQ190L ([id number]) Olympus colonoscope was                            introduced through the anus and advanced to the the                            cecum, identified by appendiceal orifice and                            ileocecal valve. The colonoscopy was performed                            without difficulty. The patient tolerated the                            procedure well. The quality of the bowel                            preparation was good. The ileocecal valve,                            appendiceal orifice, and rectum were photographed. Scope In: 8:43:33 AM Scope Out: 9:15:23 AM Scope Withdrawal Time: 0 hours 26 minutes 22 seconds  Total Procedure Duration: 0 hours 31 minutes 49 seconds  Findings:      The perianal and digital rectal examinations were normal.      Nine sessile polyps were found in the sigmoid colon (5), descending       colon (1), transverse colon (2), and ascending colon (1). The polyps       were 3 to 6 mm in size. These polyps were removed with a cold snare.       Resection and retrieval were complete. Estimated blood loss was minimal.      Two sessile polyps were found in the sigmoid colon. The polyps were 10       to 12 mm in size. These polyps were removed with a cold snare. Resection       and retrieval were complete.  Estimated blood loss was minimal.      A 3 mm polyp was found in the rectum. The polyp was sessile. The polyp       was removed with a cold snare. Resection and retrieval were complete.       Estimated blood loss was minimal.      Anal papilla(e) were hypertrophied. Impression:               - Nine 3 to 6 mm polyps in the sigmoid colon, in                            the descending colon, in the transverse colon and                            in the ascending colon, removed with a cold snare.                            Resected and retrieved.                            - Two 10 to 12 mm polyps in the sigmoid colon,                            removed with a cold snare. Resected and retrieved.                           - One 3 mm polyp in the rectum, removed with a cold                            snare. Resected and retrieved.                           - Anal papilla(e) were hypertrophied. Moderate Sedation:      Not Applicable - Patient had care per Anesthesia. Recommendation:           - Patient has a contact number available for                            emergencies. The signs and symptoms of potential                            delayed complications were discussed with the                            patient. Return to normal activities tomorrow.                            Written discharge instructions were provided to the                            patient.                           - Resume previous diet.                           -  Continue present medications.                           - Await pathology results.                           - Repeat colonoscopy for surveillance based on                            pathology results. Procedure Code(s):        --- Professional ---                           [phone], Colonoscopy, flexible; with removal of                            tumor(s), polyp(s), or other lesion(s) by snare                            technique Diagnosis Code(s):        --- Professional ---                           K63.5, Polyp of colon                           K62.1, Rectal polyp                           K62.89, Other specified diseases of anus and rectum                           R19.5, Other fecal abnormalities CPT copyright 2019 American Medical Association. All rights reserved. The codes documented in this report are preliminary and upon coder review may  be revised to meet current compliance requirements. Gerrit Heck, MD 08/25/2020 9:31:08 AM Number of Addenda: 0

## 2020-08-25 NOTE — Discharge Instructions (Signed)
YOU HAD AN ENDOSCOPIC PROCEDURE TODAY: Refer to the procedure report and other information in the discharge instructions given to you for any specific questions about what was found during the examination. If this information does not answer your questions, please call [place] office at [phone] to clarify.  ° °YOU SHOULD EXPECT: Some feelings of bloating in the abdomen. Passage of more gas than usual. Walking can help get rid of the air that was put into your GI tract during the procedure and reduce the bloating. If you had a lower endoscopy (such as a colonoscopy or flexible sigmoidoscopy) you may notice spotting of blood in your stool or on the toilet paper. Some abdominal soreness may be present for a day or two, also. ° °DIET: Your first meal following the procedure should be a light meal and then it is ok to progress to your normal diet. A half-sandwich or bowl of soup is an example of a good first meal. Heavy or fried foods are harder to digest and may make you feel nauseous or bloated. Drink plenty of fluids but you should avoid alcoholic beverages for 24 hours. If you had a esophageal dilation, please see attached instructions for diet.   ° °ACTIVITY: Your care partner should take you home directly after the procedure. You should plan to take it easy, moving slowly for the rest of the day. You can resume normal activity the day after the procedure however YOU SHOULD NOT DRIVE, use power tools, machinery or perform tasks that involve climbing or major physical exertion for 24 hours (because of the sedation medicines used during the test).  ° °SYMPTOMS TO REPORT IMMEDIATELY: °A gastroenterologist can be reached at any hour. Please call [phone]  for any of the following symptoms:  °Following lower endoscopy (colonoscopy, flexible sigmoidoscopy) °Excessive amounts of blood in the stool  °Significant tenderness, worsening of abdominal pains  °Swelling of the abdomen that is new, acute  °Fever of 100° or  higher  °Following upper endoscopy (EGD, EUS, ERCP, esophageal dilation) °Vomiting of blood or coffee ground material  °New, significant abdominal pain  °New, significant chest pain or pain under the shoulder blades  °Painful or persistently difficult swallowing  °New shortness of breath  °Black, tarry-looking or red, bloody stools ° °FOLLOW UP:  °If any biopsies were taken you will be contacted by phone or by letter within the next 1-3 weeks. Call [phone]  if you have not heard about the biopsies in 3 weeks.  °Please also call with any specific questions about appointments or follow up tests. ° °

## 2020-08-26 ENCOUNTER — Encounter: Payer: Self-pay | Admitting: Internal Medicine

## 2020-08-26 LAB — SURGICAL PATHOLOGY

## 2020-09-01 ENCOUNTER — Encounter: Payer: Self-pay | Admitting: Gastroenterology

## 2020-09-08 ENCOUNTER — Ambulatory Visit: Payer: Medicare Other | Admitting: Internal Medicine

## 2020-09-08 ENCOUNTER — Ambulatory Visit: Payer: Medicare Other

## 2020-09-21 ENCOUNTER — Other Ambulatory Visit: Payer: Self-pay

## 2020-09-21 ENCOUNTER — Other Ambulatory Visit (HOSPITAL_COMMUNITY): Payer: Self-pay

## 2020-10-05 ENCOUNTER — Other Ambulatory Visit: Payer: Self-pay

## 2020-10-05 ENCOUNTER — Ambulatory Visit (INDEPENDENT_AMBULATORY_CARE_PROVIDER_SITE_OTHER): Payer: Medicare Other | Admitting: Internal Medicine

## 2020-10-05 ENCOUNTER — Encounter: Payer: Self-pay | Admitting: Internal Medicine

## 2020-10-05 ENCOUNTER — Ambulatory Visit (INDEPENDENT_AMBULATORY_CARE_PROVIDER_SITE_OTHER): Payer: Medicare Other

## 2020-10-05 VITALS — BP 136/98 | HR 92 | Temp 97.9°F | Ht 65.0 in | Wt 371.8 lb

## 2020-10-05 VITALS — BP 140/90 | HR 92 | Temp 97.9°F | Ht 65.0 in | Wt 371.8 lb

## 2020-10-05 DIAGNOSIS — Z6841 Body Mass Index (BMI) 40.0 and over, adult: Secondary | ICD-10-CM

## 2020-10-05 DIAGNOSIS — E1165 Type 2 diabetes mellitus with hyperglycemia: Secondary | ICD-10-CM

## 2020-10-05 DIAGNOSIS — K2930 Chronic superficial gastritis without bleeding: Secondary | ICD-10-CM | POA: Diagnosis not present

## 2020-10-05 DIAGNOSIS — I10 Essential (primary) hypertension: Secondary | ICD-10-CM

## 2020-10-05 DIAGNOSIS — E039 Hypothyroidism, unspecified: Secondary | ICD-10-CM

## 2020-10-05 DIAGNOSIS — Z1231 Encounter for screening mammogram for malignant neoplasm of breast: Secondary | ICD-10-CM | POA: Diagnosis not present

## 2020-10-05 DIAGNOSIS — Z0000 Encounter for general adult medical examination without abnormal findings: Secondary | ICD-10-CM | POA: Diagnosis not present

## 2020-10-05 DIAGNOSIS — R413 Other amnesia: Secondary | ICD-10-CM

## 2020-10-05 DIAGNOSIS — Z23 Encounter for immunization: Secondary | ICD-10-CM | POA: Diagnosis not present

## 2020-10-05 LAB — POCT UA - MICROALBUMIN
Albumin/Creatinine Ratio, Urine, POC: <30
Creatinine, POC: 50 mg/dL
Microalbumin Ur, POC: 10 mg/L

## 2020-10-05 LAB — POCT URINALYSIS DIPSTICK
Bilirubin, UA: NEGATIVE
Blood, UA: NEGATIVE
Glucose, UA: NEGATIVE
Ketones, UA: NEGATIVE
Leukocytes, UA: NEGATIVE
Nitrite, UA: NEGATIVE
Protein, UA: NEGATIVE
Spec Grav, UA: 1.01 (ref 1.010–1.025)
Urobilinogen, UA: 0.2 E.U./dL
pH, UA: 6 (ref 5.0–8.0)

## 2020-10-05 MED ORDER — TETANUS-DIPHTH-ACELL PERTUSSIS 5-2-15.5 LF-MCG/0.5 IM SUSP: 0.5000 mL | Freq: Once | INTRAMUSCULAR | 0 refills | Status: AC

## 2020-10-05 MED ORDER — LOSARTAN POTASSIUM 25 MG PO TABS: 25.0000 mg | ORAL_TABLET | Freq: Every day | ORAL | 11 refills | Status: DC

## 2020-10-05 MED ORDER — FAMOTIDINE 20 MG PO TABS: 20.0000 mg | ORAL_TABLET | Freq: Two times a day (BID) | ORAL | 1 refills | Status: DC

## 2020-10-05 NOTE — Progress Notes (Signed)
This visit occurred during the SARS-CoV-2 public health emergency.  Safety protocols were in place, including screening questions prior to the visit, additional usage of staff PPE, and extensive cleaning of exam room while observing appropriate contact time as indicated for disinfecting solutions.  Subjective:   [name] Wright is a 55 y.o. female who presents for Medicare Annual (Subsequent) preventive examination.  Review of Systems     Cardiac Risk Factors include: obesity (BMI >30kg/m2);sedentary lifestyle     Objective:    Today's Vitals   10/05/20 1007 10/05/20 1013  BP: (!) 150/90 140/90  Pulse: 92   Temp: 97.9 F (36.6 C)   TempSrc: Oral   SpO2: 98%   Weight: (!) 371 lb 12.8 oz (168.6 kg)   Height: 5' 5"  (1.651 m)   PainSc:  2    Body mass index is 61.87 kg/m.  Advanced Directives 10/05/2020 08/25/2020 08/13/2019 06/12/2019 08/12/2018 04/10/2017 10/19/2014  Does Patient Have a Medical Advance Directive? No Yes No No No No No  Type of Advance Directive - Living will - - - - -  Would patient like information on creating a medical advance directive? No - Patient declined - No - Patient declined No - Patient declined - Yes (MAU/Ambulatory/Procedural Areas - Information given) -    Current Medications (verified) Outpatient Encounter Medications as of 10/05/2020  Medication Sig   albuterol (PROAIR HFA) 108 (90 Base) MCG/ACT inhaler Inhale 2 puffs into the lungs every 4 (four) hours as needed for wheezing or shortness of breath.   baclofen (LIORESAL) 10 MG tablet Take 5-10 mg by mouth every 8 (eight) hours as needed for muscle spasms.    diphenhydrAMINE (BENADRYL) 25 mg capsule Take 25 mg by mouth every 6 (six) hours as needed for allergies.    fluticasone (FLONASE) 50 MCG/ACT nasal spray Place 1 spray into both nostrils in the morning.   ibuprofen (ADVIL,MOTRIN) 200 MG tablet Take 600 mg by mouth every 8 (eight) hours as needed for headache or moderate pain.   levothyroxine  (SYNTHROID) 100 MCG tablet TAKE 1 TABLET BY MOUTH BEFORE BREAKFAST (Patient taking differently: Take 100 mcg by mouth daily before breakfast.)   meclizine (ANTIVERT) 25 MG tablet Take 12.5-25 mg by mouth 2 (two) times daily as needed for dizziness (vertigo).   methocarbamol (ROBAXIN) 500 MG tablet Take 500 mg by mouth every 6 (six) hours as needed for muscle spasms.   morphine (MSIR) 15 MG tablet Take 15 mg by mouth 4 (four) times daily as needed for severe pain.    Multiple Vitamins-Minerals (MAXIMUM DAILY GREEN PO) Take 1 Package by mouth 3 (three) times a week. It Works! Greens   promethazine (PHENERGAN) 25 MG tablet TAKE 1/2 TO 1 (ONE-HALF TO ONE) TABLET BY MOUTH EVERY 8 HOURS AS NEEDED FOR NAUSEA (Patient taking differently: Take 25 mg by mouth every 8 (eight) hours as needed for nausea or vomiting.)   RESTASIS 0.05 % ophthalmic emulsion Place 1 drop into both eyes 2 (two) times daily.   SUDOGEST MAXIMUM STRENGTH 30 MG tablet TAKE 1 TABLET BY MOUTH TWICE DAILY AS NEEDED FOR CONGESTION (Patient taking differently: Take 30 mg by mouth 2 (two) times daily as needed for congestion.)   Tdap (ADACEL) 08-08-13.5 LF-MCG/0.5 injection Inject 0.5 mLs into the muscle once for 1 dose.   traMADol (ULTRAM) 50 MG tablet TAKE 1 TABLET BY MOUTH EVERY 6 HOURS AS NEEDED (Patient taking differently: Take 50 mg by mouth in the morning, at noon, and at bedtime.)  Sodium Sulfate-Mag Sulfate-KCl (SUTAB) [phone] MG TABS Take 1 kit by mouth as directed. (Patient not taking: Reported on 10/05/2020)   No facility-administered encounter medications on file as of 10/05/2020.    Allergies (verified) Doxycycline, Oxycodone-acetaminophen, Prochlorperazine edisylate, Amoxicillin, Compazine, Eszopiclone, Keppra [levetiracetam], Latex, Levetiracetam, Neurontin [gabapentin], Propoxyphene, Tape, Trileptal [oxcarbazepine], Bactrim [sulfamethoxazole-trimethoprim], Bupropion, Codeine, Hydrocodone-acetaminophen, Moxifloxacin,  Propoxyphene n-acetaminophen, and Vicodin [hydrocodone-acetaminophen]   History: Past Medical History:  Diagnosis Date   Acute cystitis    Allergic rhinitis    Childhood asthma    no problems as adult - no inhaler   Chronic insomnia    Chronic pain due to trauma 02/2001   closed head trauma - Followed by Dr [facility]   Complication of anesthesia    Dyspepsia    Head trauma 02/24/2001   closed   History of kidney stones    passed stone - no surgery required   Hypothyroidism    PONV (postoperative nausea and vomiting)    Pre-diabetes    SVD (spontaneous vaginal delivery)    fetal demise at 5 months   Past Surgical History:  Procedure Laterality Date   APPENDECTOMY     BIOPSY  08/25/2020   Procedure: BIOPSY;  Surgeon: Lavena Bullion, DO;  Location: [facility];  Service: Gastroenterology;;   COLONOSCOPY WITH PROPOFOL N/A 08/25/2020   Procedure: COLONOSCOPY WITH PROPOFOL;  Surgeon: Lavena Bullion, DO;  Location: [facility];  Service: Gastroenterology;  Laterality: N/A;   DILATATION & CURETTAGE/HYSTEROSCOPY WITH MYOSURE N/A 04/15/2017   Procedure: DILATATION & CURETTAGE/HYSTEROSCOPY WITH MYOSURE POLYPECTOMY;  Surgeon: Everlene Farrier, MD;  Location: [facility];  Service: Gynecology;  Laterality: N/A;   DILATATION & CURETTAGE/HYSTEROSCOPY WITH MYOSURE N/A 06/18/2019   Procedure: DILATATION & CURETTAGE/HYSTEROSCOPY WITH  MYOSURE;  Surgeon: Everlene Farrier, MD;  Location: [facility];  Service: Gynecology;  Laterality: N/A;   ESOPHAGOGASTRODUODENOSCOPY (EGD) WITH PROPOFOL N/A 08/25/2020   Procedure: ESOPHAGOGASTRODUODENOSCOPY (EGD) WITH PROPOFOL;  Surgeon: Lavena Bullion, DO;  Location: [facility];  Service: Gastroenterology;  Laterality: N/A;   EYE SURGERY     cataracts removed   INCONTINENCE SURGERY     PILONIDAL CYST EXCISION     x2   POLYPECTOMY  08/25/2020   Procedure: POLYPECTOMY;  Surgeon: Lavena Bullion, DO;  Location: [facility];   Service: Gastroenterology;;   repair of toe laceration     dermabond to right big toe right foot   repair of torn ligament right leg     patient denies this surgery   right foot fracture     cast only   TONSILLECTOMY     WISDOM TOOTH EXTRACTION     Family History  Problem Relation Age of Onset   Heart Problems Father    Ovarian cancer Maternal Grandmother    Multiple sclerosis Mother    Lung cancer Maternal Grandfather    Social History   Socioeconomic History   Marital status: Married    Spouse name: Not on file   Number of children: 0   Years of education: Not on file   Highest education level: Not on file  Occupational History   Occupation: disability  Tobacco Use   Smoking status: Never   Smokeless tobacco: Never  Vaping Use   Vaping Use: Never used  Substance and Sexual Activity   Alcohol use: No    Comment: "1 drink/year"   Drug use: Yes    Types: Morphine    Comment: Morphine 15 mg tab 4xdaily prn  Sexual activity: Not Currently    Birth control/protection: None  Other Topics Concern   Not on file  Social History Narrative   [employer], BS, 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   Married- '94- 8 years divorced, Married '03   No children   Retired- disability from closed head injury with focus, cognition   Social Determinants of Radio broadcast assistant Strain: Low Risk    Difficulty of Paying Living Expenses: Not hard at all  Food Insecurity: No Food Insecurity   Worried About Charity fundraiser in the Last Year: Never true   Arboriculturist in the Last Year: Never true  Transportation Needs: No Transportation Needs   Lack of Transportation (Medical): No   Lack of Transportation (Non-Medical): No  Physical Activity: Inactive   Days of Exercise per Week: 0 days   Minutes of Exercise per Session: 0 min  Stress: No Stress Concern Present   Feeling of Stress : Not at all  Social Connections: Not on file    Tobacco Counseling Counseling given: Not  Answered   Clinical Intake:  Pre-visit preparation completed: Yes  Pain : 0-10 Pain Score: 2  Pain Type: Chronic pain Pain Location: Abdomen Pain Orientation: Right Pain Descriptors / Indicators: Discomfort Pain Onset: More than a month ago Pain Frequency: Constant     Nutritional Status: BMI > 30  Obese Nutritional Risks: Nausea/ vomitting/ diarrhea (nausea regularly) Diabetes: No  How often do you need to have someone help you when you read instructions, pamphlets, or other written materials from your doctor or pharmacy?: 1 - Never What is the last grade level you completed in school?: BS degree  Diabetic? no  Interpreter Needed?: No  Information entered by :: NAllen LPN   Activities of Daily Living In your present state of health, do you have any difficulty performing the following activities: 10/05/2020  Hearing? N  Vision? N  Difficulty concentrating or making decisions? N  Walking or climbing stairs? Y  Comment SOB  Dressing or bathing? N  Doing errands, shopping? N  Preparing Food and eating ? N  Using the Toilet? N  In the past six months, have you accidently leaked urine? Y  Do you have problems with loss of bowel control? N  Managing your Medications? N  Managing your Finances? N  Housekeeping or managing your Housekeeping? N  Some recent data might be hidden    Patient Care Team: [name], MD as PCP - General (Internal Medicine) Biagio Borg, MD (Internal Medicine)  Indicate any recent Medical Services you may have received from other than [facility] providers in the past year (date may be approximate).     Assessment:   This is a routine wellness examination for [name] Wright.  Hearing/Vision screen Vision Screening - Comments:: Regular eye exams, [facility]  Dietary issues and exercise activities discussed: Current Exercise Habits: The patient does not participate in regular exercise at present   Goals Addressed              This Visit's Progress    Patient Stated       10/05/2020, wants to weigh 200 pounds        Depression Screen PHQ 2/9 Scores 10/05/2020 08/13/2019 03/10/2019 08/12/2018 05/06/2018 03/12/2018 02/25/2018  PHQ - 2 Score 0 0 0 0 0 0 0  PHQ- 9 Score - 0 - 0 - - -    Fall Risk Fall Risk  10/05/2020 08/13/2019 03/10/2019 01/22/2019 08/12/2018  Falls in the past year? 1 0 0 0 0  Comment slid on ice - - - -  Number falls in past yr: 0 - - - -  Injury with Fall? 1 - - - -  Comment slight concussion - - - -  Risk for fall due to : Medication side effect Medication side effect - - Medication side effect  Follow up Falls evaluation completed;Education provided;Falls prevention discussed Falls evaluation completed;Education provided;Falls prevention discussed - - Falls prevention discussed    FALL RISK PREVENTION PERTAINING TO THE HOME:  Any stairs in or around the home? No  If so, are there any without handrails? N/a Home free of loose throw rugs in walkways, pet beds, electrical cords, etc? Yes  Adequate lighting in your home to reduce risk of falls? Yes   ASSISTIVE DEVICES UTILIZED TO PREVENT FALLS:  Life alert? No  Use of a cane, walker or w/c? No  Grab bars in the bathroom? No  Shower chair or bench in shower? Yes  Elevated toilet seat or a handicapped toilet? No   TIMED UP AND GO:  Was the test performed? No .    Gait slow and steady without use of assistive device  Cognitive Function:     6CIT Screen 10/05/2020 08/13/2019 08/12/2018  What Year? 0 points 0 points 0 points  What month? 0 points 0 points 0 points  What time? 0 points 0 points 0 points  Count back from 20 0 points 2 points 0 points  Months in reverse 0 points 0 points 0 points  Repeat phrase 4 points 0 points 0 points  Total Score 4 2 0    Immunizations Immunization History  Administered Date(s) Administered   Td 09/26/2001    TDAP status: Due, Education has been provided regarding the importance of this vaccine.  Advised may receive this vaccine at local pharmacy or Health Dept. Aware to provide a copy of the vaccination record if obtained from local pharmacy or Health Dept. Verbalized acceptance and understanding.  Flu Vaccine status: Declined, Education has been provided regarding the importance of this vaccine but patient still declined. Advised may receive this vaccine at local pharmacy or Health Dept. Aware to provide a copy of the vaccination record if obtained from local pharmacy or Health Dept. Verbalized acceptance and understanding.  Pneumococcal vaccine status: Declined,  Education has been provided regarding the importance of this vaccine but patient still declined. Advised may receive this vaccine at local pharmacy or Health Dept. Aware to provide a copy of the vaccination record if obtained from local pharmacy or Health Dept. Verbalized acceptance and understanding.   Covid-19 vaccine status: Declined, Education has been provided regarding the importance of this vaccine but patient still declined. Advised may receive this vaccine at local pharmacy or Health Dept.or vaccine clinic. Aware to provide a copy of the vaccination record if obtained from local pharmacy or Health Dept. Verbalized acceptance and understanding.  Qualifies for Shingles Vaccine? Yes   Zostavax completed No   Shingrix Completed?: No.    Education has been provided regarding the importance of this vaccine. Patient has been advised to call insurance company to determine out of pocket expense if they have not yet received this vaccine. Advised may also receive vaccine at local pharmacy or Health Dept. Verbalized acceptance and understanding.  Screening Tests Health Maintenance  Topic Date Due   URINE MICROALBUMIN  Never done   HIV Screening  Never done   MAMMOGRAM  Never done  COVID-19 Vaccine (1) 10/21/2020 (Originally 01/06/1971)   Zoster Vaccines- Shingrix (1 of 2) 01/05/2021 (Originally 01/05/1986)   Pneumococcal Vaccine  58-11 Years old (1 - PCV) 10/05/2021 (Originally [birth year]1973)   TETANUS/TDAP  10/05/2021 (Originally 09/27/2011)   INFLUENZA VACCINE  11/07/2020   PAP SMEAR-Modifier  08/12/2022   COLONOSCOPY (Pts 45-65yr Insurance coverage will need to be confirmed)  08/26/2030   Hepatitis C Screening  Completed   HPV VACCINES  Aged Out    Health Maintenance  Health Maintenance Due  Topic Date Due   URINE MICROALBUMIN  Never done   HIV Screening  Never done   MAMMOGRAM  Never done    Colorectal cancer screening: Type of screening: Colonoscopy. Completed 08/25/2020. Repeat every 1 years  Mammogram status: Ordered today. Pt provided with contact info and advised to call to schedule appt.   Bone Density status: n/a  Lung Cancer Screening: (Low Dose CT Chest recommended if Age 54-80years, 30 pack-year currently smoking OR have quit w/in 15years.) does not qualify.   Lung Cancer Screening Referral: no  Additional Screening:  Hepatitis C Screening: does qualify; Completed 10/07/2019  Vision Screening: Recommended annual ophthalmology exams for early detection of glaucoma and other disorders of the eye. Is the patient up to date with their annual eye exam?  Yes  Who is the provider or what is the name of the [facility] which the patient [facility]  If pt is not established with a provider, would they like to be referred to a provider to establish care? No .   Dental Screening: Recommended annual dental exams for proper oral hygiene  Community Resource Referral / Chronic Care Management: CRR required this visit?  No   CCM required this visit?  No      Plan:     I have personally reviewed and noted the following in the patient's chart:   Medical and social history Use of alcohol, tobacco or illicit drugs  Current medications and supplements including opioid prescriptions.  Functional ability and status Nutritional status Physical activity Advanced  directives List of other physicians Hospitalizations, surgeries, and ER visits in previous 12 months Vitals Screenings to include cognitive, depression, and falls Referrals and appointments  In addition, I have reviewed and discussed with patient certain preventive protocols, quality metrics, and best practice recommendations. A written personalized care plan for preventive services as well as general preventive health recommendations were provided to patient.     NKellie Simmering LPN   [id number]  Nurse Notes:

## 2020-10-05 NOTE — Progress Notes (Signed)
I,Katawbba Wiggins,acting as a Education administrator for Maximino Greenland, MD.,have documented all relevant documentation on the behalf of Maximino Greenland, MD,as directed by  Maximino Greenland, MD while in the presence of Maximino Greenland, MD.  This visit occurred during the SARS-CoV-2 public health emergency.  Safety protocols were in place, including screening questions prior to the visit, additional usage of staff PPE, and extensive cleaning of exam room while observing appropriate contact time as indicated for disinfecting solutions.  Subjective:     Patient ID: [name] Wright , female    DOB: 15-Apr-1965 , 55 y.o.   MRN: [id number]   Chief Complaint  Patient presents with   Hypothyroidism     HPI  She is here today for a thyroid check. She reports compliance with meds. She has not had any issues with her thyroid meds.     Past Medical History:  Diagnosis Date   Acute cystitis    Allergic rhinitis    Childhood asthma    no problems as adult - no inhaler   Chronic insomnia    Chronic pain due to trauma 02/2001   closed head trauma - Followed by [facility]   Complication of anesthesia    Dyspepsia    Head trauma 02/24/2001   closed   History of kidney stones    passed stone - no surgery required   Hypothyroidism    PONV (postoperative nausea and vomiting)    Pre-diabetes    SVD (spontaneous vaginal delivery)    fetal demise at 5 months     Family History  Problem Relation Age of Onset   Heart Problems Father    Ovarian cancer Maternal Grandmother    Multiple sclerosis Mother    Lung cancer Maternal Grandfather      Current Outpatient Medications:    famotidine (PEPCID) 20 MG tablet, Take 1 tablet (20 mg total) by mouth 2 (two) times daily., Disp: 60 tablet, Rfl: 1   losartan (COZAAR) 25 MG tablet, Take 1 tablet (25 mg total) by mouth daily., Disp: 30 tablet, Rfl: 11   albuterol (PROAIR HFA) 108 (90 Base) MCG/ACT inhaler, Inhale 2 puffs into the  lungs every 4 (four) hours as needed for wheezing or shortness of breath., Disp: 1 Inhaler, Rfl: 1   baclofen (LIORESAL) 10 MG tablet, Take 5-10 mg by mouth every 8 (eight) hours as needed for muscle spasms. , Disp: , Rfl:    baclofen (LIORESAL) 10 MG tablet, Take by mouth., Disp: , Rfl:    diphenhydrAMINE (BENADRYL) 25 mg capsule, Take 25 mg by mouth every 6 (six) hours as needed for allergies. , Disp: , Rfl:    fluticasone (FLONASE) 50 MCG/ACT nasal spray, Place 1 spray into both nostrils in the morning., Disp: , Rfl:    ibuprofen (ADVIL,MOTRIN) 200 MG tablet, Take 600 mg by mouth every 8 (eight) hours as needed for headache or moderate pain., Disp: , Rfl:    levothyroxine (SYNTHROID) 100 MCG tablet, TAKE 1 TABLET BY MOUTH BEFORE BREAKFAST (Patient taking differently: Take 100 mcg by mouth daily before breakfast.), Disp: 90 tablet, Rfl: 0   meclizine (ANTIVERT) 25 MG tablet, Take 12.5-25 mg by mouth 2 (two) times daily as needed for dizziness (vertigo)., Disp: , Rfl:    methocarbamol (ROBAXIN) 500 MG tablet, Take 500 mg by mouth every 6 (six) hours as needed for muscle spasms., Disp: , Rfl:    morphine (MSIR) 15 MG tablet, Take 15 mg  by mouth 4 (four) times daily as needed for severe pain. , Disp: , Rfl:    Multiple Vitamins-Minerals (MAXIMUM DAILY GREEN PO), Take 1 Package by mouth 3 (three) times a week. It [employer]! Greens, Disp: , Rfl:    promethazine (PHENERGAN) 25 MG tablet, TAKE 1/2 TO 1 (ONE-HALF TO ONE) TABLET BY MOUTH EVERY 8 HOURS AS NEEDED FOR NAUSEA, Disp: 40 tablet, Rfl: 0   promethazine (PHENERGAN) 25 MG tablet, Take by mouth., Disp: , Rfl:    RESTASIS 0.05 % ophthalmic emulsion, Place 1 drop into both eyes 2 (two) times daily., Disp: , Rfl:    Sodium Sulfate-Mag Sulfate-KCl (SUTAB) [phone] MG TABS, Take 1 kit by mouth as directed. (Patient not taking: No sig reported), Disp: 24 tablet, Rfl: 0   SUDOGEST MAXIMUM STRENGTH 30 MG tablet, TAKE 1 TABLET BY MOUTH TWICE DAILY AS NEEDED FOR  CONGESTION (Patient not taking: Reported on 10/24/2020), Disp: 90 tablet, Rfl: 0   traMADol (ULTRAM) 50 MG tablet, TAKE 1 TABLET BY MOUTH EVERY 6 HOURS AS NEEDED (Patient not taking: Reported on 10/24/2020), Disp: 30 tablet, Rfl: 0   Allergies  Allergen Reactions   Doxycycline Anaphylaxis, Diarrhea and Nausea And Vomiting    headache   Oxycodone-Acetaminophen Hives and Nausea And Vomiting   Prochlorperazine Edisylate Other (See Comments)    Hyper and jitteriness   Amoxicillin Rash and Other (See Comments)    Has patient had a PCN reaction causing immediate rash, facial/tongue/throat swelling, SOB or lightheadedness with hypotension: Unknown Has patient had a PCN reaction causing severe rash involving mucus membranes or skin necrosis: Unknown Has patient had a PCN reaction that required hospitalization: No Has patient had a PCN reaction occurring within the last 10 years: Yes If all of the above answers are "NO", then may proceed with Cephalosporin use.    Compazine Other (See Comments)    Hyper/jitters/blotches   Eszopiclone Other (See Comments)    Metallic taste and became un effective    Keppra [Levetiracetam] Other (See Comments)    hyperactive   Latex    Levetiracetam Other (See Comments)    Jitters/hyper   Neurontin [Gabapentin] Nausea And Vomiting   Propoxyphene Hives   Tape     PAPER TAPE-skin burns/severe irritation  PATIENT DOES NOT TOLERATE PAPER TAPE   Trileptal [Oxcarbazepine] Other (See Comments)    REACTION: immune system suppressed   Bactrim [Sulfamethoxazole-Trimethoprim] Rash   Bupropion Rash   Codeine Rash   Hydrocodone-Acetaminophen Rash   Moxifloxacin Swelling and Rash   Propoxyphene N-Acetaminophen Nausea And Vomiting and Rash   Vicodin [Hydrocodone-Acetaminophen] Rash     Review of Systems  Constitutional: Negative.   Respiratory: Negative.    Cardiovascular: Negative.   Gastrointestinal: Negative.   Neurological:        She c/o having memory  issues. She denies change in her appetite/sleep habits. She has no new mood issues. She is not sure what could be contributing to her symptoms.   Psychiatric/Behavioral: Negative.    All other systems reviewed and are negative.   Today's Vitals   10/05/20 1045 10/05/20 1051  BP: (!) 150/90 (!) 136/98  Pulse: 92   Temp: 97.9 F (36.6 C)   TempSrc: Oral   Weight: (!) 371 lb 12.8 oz (168.6 kg)   Height: _0  (1.651 m)    Body mass index is 61.87 kg/m.  Wt Readings from Last 3 Encounters:  10/24/20 (!) 368 lb 6.4 oz (167.1 kg)  10/18/20 (!) 370 lb (167.8 kg)  10/11/20 (!) 369 lb (167.4 kg)    BP Readings from Last 3 Encounters:  10/24/20 132/78  10/18/20 132/84  10/11/20 132/80     Objective:  Physical Exam Vitals and nursing note reviewed.  Constitutional:      Appearance: Normal appearance. She is obese.  HENT:     Head: Normocephalic and atraumatic.     Nose:     Comments: Masked     Mouth/Throat:     Comments: Masked  Cardiovascular:     Rate and Rhythm: Normal rate and regular rhythm.     Heart sounds: Normal heart sounds.  Pulmonary:     Effort: Pulmonary effort is normal.     Breath sounds: Normal breath sounds.  Musculoskeletal:     Cervical back: Normal range of motion.  Skin:    General: Skin is warm.  Neurological:     General: No focal deficit present.     Mental Status: She is alert.  Psychiatric:        Mood and Affect: Mood normal.        Behavior: Behavior normal.        Assessment And Plan:     1. Primary hypothyroidism Comments: I will check thyroid panel and adjust meds as needed.  - TSH - T4, Free  2. Uncontrolled type 2 diabetes mellitus with hyperglycemia (HCC) Comments: Chronic, previous hba1c 7.2 (Oct '21) reviewed. I stressed need to start meds, she declines at this time. I will recheck hba1c today. - Hemoglobin A1c - Lipid panel  3. Essential hypertension, benign Comments: Pt advised of elevated BP readings. Advised to  follow low sodium diet. I will start her on losartan 70m daily. She will f/u in 2-3 weeks for nurse visit.   4. Chronic superficial gastritis without bleeding Comments: Chronic. I will send rx famotidine. Advised to take as directed. She is also followed by GI.   5. Memory changes Comments: I will check labs as listed below. I am concerned has low vitamin B12 level. I will make further recommendations once her labs are available for review. - RPR - Vitamin B12  6. Class 3 severe obesity due to excess calories with serious comorbidity and body mass index (BMI) of 60.0 to 69.9 in adult ([facility]  She is encouraged to initially strive for BMI less than 55 to decrease cardiac risk. Advised to aim for at least 150 minutes of exercise per week. She is reminded this includes chair exercises.   Patient was given opportunity to ask questions. Patient verbalized understanding of the plan and was able to repeat key elements of the plan. All questions were answered to their satisfaction.   I, RMaximino Greenland MD, have reviewed all documentation for this visit. The documentation on 10/05/20 for the exam, diagnosis, procedures, and orders are all accurate and complete.  IF YOU HAVE BEEN REFERRED TO A SPECIALIST, IT MAY TAKE 1-2 WEEKS TO SCHEDULE/PROCESS THE REFERRAL. IF YOU HAVE NOT HEARD FROM US/SPECIALIST IN TWO WEEKS, PLEASE GIVE [facility] CALL AT [phone].   THE PATIENT IS ENCOURAGED TO PRACTICE SOCIAL DISTANCING DUE TO THE COVID-19 PANDEMIC.

## 2020-10-05 NOTE — Patient Instructions (Signed)

## 2020-10-05 NOTE — Patient Instructions (Signed)
[name] Wright , Thank you for taking time to come for your Medicare Wellness Visit. I appreciate your ongoing commitment to your health goals. Please review the following plan we discussed and let me know if I can assist you in the future.   Screening recommendations/referrals: Colonoscopy: completed 08/25/2020, due 08/25/2021 Mammogram: ordered today Bone Density: n/a Recommended yearly ophthalmology/optometry visit for glaucoma screening and checkup Recommended yearly dental visit for hygiene and checkup  Vaccinations: Influenza vaccine: decline Pneumococcal vaccine: decline Tdap vaccine: sent to pharmacy Shingles vaccine: discussed  Covid-19:  decline  Advanced directives: Advance directive discussed with you today. Even though you declined this today please call our office should you change your mind and we can give you the proper paperwork for you to fill out.  Conditions/risks identified: none  Next appointment: Follow up in one year for your annual wellness visit.   Preventive Care 40-64 Years, Female Preventive care refers to lifestyle choices and visits with your health care provider that can promote health and wellness. What does preventive care include? A yearly physical exam. This is also called an annual well check. Dental exams once or twice a year. Routine eye exams. Ask your health care provider how often you should have your eyes checked. Personal lifestyle choices, including: Daily care of your teeth and gums. Regular physical activity. Eating a healthy diet. Avoiding tobacco and drug use. Limiting alcohol use. Practicing safe sex. Taking low-dose aspirin daily starting at age 63. Taking vitamin and mineral supplements as recommended by your health care provider. What happens during an annual well check? The services and screenings done by your health care provider during your annual well check will depend on your age, overall health, lifestyle risk factors, and  family history of disease. Counseling  Your health care provider may ask you questions about your: Alcohol use. Tobacco use. Drug use. Emotional well-being. Home and relationship well-being. Sexual activity. Eating habits. Work and work Statistician. Method of birth control. Menstrual cycle. Pregnancy history. Screening  You may have the following tests or measurements: Height, weight, and BMI. Blood pressure. Lipid and cholesterol levels. These may be checked every 5 years, or more frequently if you are over 13 years old. Skin check. Lung cancer screening. You may have this screening every year starting at age 47 if you have a 30-pack-year history of smoking and currently smoke or have quit within the past 15 years. Fecal occult blood test (FOBT) of the stool. You may have this test every year starting at age 43. Flexible sigmoidoscopy or colonoscopy. You may have a sigmoidoscopy every 5 years or a colonoscopy every 10 years starting at age 32. Hepatitis C blood test. Hepatitis B blood test. Sexually transmitted disease (STD) testing. Diabetes screening. This is done by checking your blood sugar (glucose) after you have not eaten for a while (fasting). You may have this done every 1-3 years. Mammogram. This may be done every 1-2 years. Talk to your health care provider about when you should start having regular mammograms. This may depend on whether you have a family history of breast cancer. BRCA-related cancer screening. This may be done if you have a family history of breast, ovarian, tubal, or peritoneal cancers. Pelvic exam and Pap test. This may be done every 3 years starting at age 71. Starting at age 10, this may be done every 5 years if you have a Pap test in combination with an HPV test. Bone density scan. This is done to screen for osteoporosis.  You may have this scan if you are at high risk for osteoporosis. Discuss your test results, treatment options, and if necessary,  the need for more tests with your health care provider. Vaccines  Your health care provider may recommend certain vaccines, such as: Influenza vaccine. This is recommended every year. Tetanus, diphtheria, and acellular pertussis (Tdap, Td) vaccine. You may need a Td booster every 10 years. Zoster vaccine. You may need this after age 28. Pneumococcal 13-valent conjugate (PCV13) vaccine. You may need this if you have certain conditions and were not previously vaccinated. Pneumococcal polysaccharide (PPSV23) vaccine. You may need one or two doses if you smoke cigarettes or if you have certain conditions. Talk to your health care provider about which screenings and vaccines you need and how often you need them. This information is not intended to replace advice given to you by your health care provider. Make sure you discuss any questions you have with your health care provider. Document Released: 04/22/2015 Document Revised: 12/14/2015 Document Reviewed: 01/25/2015 Elsevier Interactive Patient Education  2017 [place] Prevention in the Home Falls can cause injuries. They can happen to people of all ages. There are many things you can do to make your home safe and to help prevent falls. What can I do on the outside of my home? Regularly fix the edges of walkways and driveways and fix any cracks. Remove anything that might make you trip as you walk through a door, such as a raised step or threshold. Trim any bushes or trees on the path to your home. Use bright outdoor lighting. Clear any walking paths of anything that might make someone trip, such as rocks or tools. Regularly check to see if handrails are loose or broken. Make sure that both sides of any steps have handrails. Any raised decks and porches should have guardrails on the edges. Have any leaves, snow, or ice cleared regularly. Use sand or salt on walking paths during winter. Clean up any spills in your garage right  away. This includes oil or grease spills. What can I do in the bathroom? Use night lights. Install grab bars by the toilet and in the tub and shower. Do not use towel bars as grab bars. Use non-skid mats or decals in the tub or shower. If you need to sit down in the shower, use a plastic, non-slip stool. Keep the floor dry. Clean up any water that spills on the floor as soon as it happens. Remove soap buildup in the tub or shower regularly. Attach bath mats securely with double-sided non-slip rug tape. Do not have throw rugs and other things on the floor that can make you trip. What can I do in the bedroom? Use night lights. Make sure that you have a light by your bed that is easy to reach. Do not use any sheets or blankets that are too big for your bed. They should not hang down onto the floor. Have a firm chair that has side arms. You can use this for support while you get dressed. Do not have throw rugs and other things on the floor that can make you trip. What can I do in the kitchen? Clean up any spills right away. Avoid walking on wet floors. Keep items that you use a lot in easy-to-reach places. If you need to reach something above you, use a strong step stool that has a grab bar. Keep electrical cords out of the way. Do not use  floor polish or wax that makes floors slippery. If you must use wax, use non-skid floor wax. Do not have throw rugs and other things on the floor that can make you trip. What can I do with my stairs? Do not leave any items on the stairs. Make sure that there are handrails on both sides of the stairs and use them. Fix handrails that are broken or loose. Make sure that handrails are as long as the stairways. Check any carpeting to make sure that it is firmly attached to the stairs. Fix any carpet that is loose or worn. Avoid having throw rugs at the top or bottom of the stairs. If you do have throw rugs, attach them to the floor with carpet tape. Make sure  that you have a light switch at the top of the stairs and the bottom of the stairs. If you do not have them, ask someone to add them for you. What else can I do to help prevent falls? Wear shoes that: Do not have high heels. Have rubber bottoms. Are comfortable and fit you well. Are closed at the toe. Do not wear sandals. If you use a stepladder: Make sure that it is fully opened. Do not climb a closed stepladder. Make sure that both sides of the stepladder are locked into place. Ask someone to hold it for you, if possible. Clearly mark and make sure that you can see: Any grab bars or handrails. First and last steps. Where the edge of each step is. Use tools that help you move around (mobility aids) if they are needed. These include: Canes. Walkers. Scooters. Crutches. Turn on the lights when you go into a dark area. Replace any light bulbs as soon as they burn out. Set up your furniture so you have a clear path. Avoid moving your furniture around. If any of your floors are uneven, fix them. If there are any pets around you, be aware of where they are. Review your medicines with your doctor. Some medicines can make you feel dizzy. This can increase your chance of falling. Ask your doctor what other things that you can do to help prevent falls. This information is not intended to replace advice given to you by your health care provider. Make sure you discuss any questions you have with your health care provider. Document Released: 01/20/2009 Document Revised: 09/01/2015 Document Reviewed: 04/30/2014 Elsevier Interactive Patient Education  2017 [employer].

## 2020-10-05 NOTE — Addendum Note (Signed)
Addended by: Glenna Durand E on: 10/05/2020 11:42 AM   Modules accepted: Orders

## 2020-10-06 LAB — HEMOGLOBIN A1C
Est. average glucose Bld gHb Est-mCnc: 174 mg/dL
Hgb A1c MFr Bld: 7.7 % — ABNORMAL HIGH (ref 4.8–5.6)

## 2020-10-06 LAB — T4, FREE: Free T4: 1.47 ng/dL (ref 0.82–1.77)

## 2020-10-06 LAB — LIPID PANEL
Chol/HDL Ratio: 3.6 ratio (ref 0.0–4.4)
Cholesterol, Total: 178 mg/dL (ref 100–199)
HDL: 49 mg/dL (ref >39)
LDL Chol Calc (NIH): 109 mg/dL — ABNORMAL HIGH (ref 0–99)
Triglycerides: 113 mg/dL (ref 0–149)
VLDL Cholesterol Cal: 20 mg/dL (ref 5–40)

## 2020-10-06 LAB — RPR: RPR Ser Ql: NONREACTIVE

## 2020-10-06 LAB — VITAMIN B12: Vitamin B-12: 332 pg/mL (ref 232–1245)

## 2020-10-06 LAB — TSH: TSH: 1.46 u[IU]/mL (ref 0.450–4.500)

## 2020-10-08 ENCOUNTER — Other Ambulatory Visit: Payer: Self-pay | Admitting: Physician Assistant

## 2020-10-11 ENCOUNTER — Ambulatory Visit (INDEPENDENT_AMBULATORY_CARE_PROVIDER_SITE_OTHER): Payer: Medicare Other

## 2020-10-11 ENCOUNTER — Other Ambulatory Visit: Payer: Self-pay

## 2020-10-11 ENCOUNTER — Encounter: Payer: Self-pay | Admitting: Nurse Practitioner

## 2020-10-11 ENCOUNTER — Ambulatory Visit (INDEPENDENT_AMBULATORY_CARE_PROVIDER_SITE_OTHER): Payer: Medicare Other | Admitting: Nurse Practitioner

## 2020-10-11 VITALS — BP 132/80 | HR 92 | Temp 97.9°F | Ht 65.0 in | Wt 369.0 lb

## 2020-10-11 VITALS — BP 132/80 | HR 89 | Temp 98.3°F | Ht 65.0 in | Wt 369.4 lb

## 2020-10-11 DIAGNOSIS — Z6841 Body Mass Index (BMI) 40.0 and over, adult: Secondary | ICD-10-CM

## 2020-10-11 DIAGNOSIS — H6692 Otitis media, unspecified, left ear: Secondary | ICD-10-CM

## 2020-10-11 DIAGNOSIS — E538 Deficiency of other specified B group vitamins: Secondary | ICD-10-CM | POA: Diagnosis not present

## 2020-10-11 DIAGNOSIS — R0981 Nasal congestion: Secondary | ICD-10-CM

## 2020-10-11 LAB — POCT RAPID STREP A (OFFICE): Rapid Strep A Screen: NEGATIVE

## 2020-10-11 LAB — POC COVID19 BINAXNOW: SARS Coronavirus 2 Ag: NEGATIVE

## 2020-10-11 MED ORDER — CIPROFLOXACIN HCL 500 MG PO TABS: 500.0000 mg | ORAL_TABLET | Freq: Two times a day (BID) | ORAL | 0 refills | Status: AC

## 2020-10-11 MED ORDER — CYANOCOBALAMIN 1000 MCG/ML IJ SOLN
1000.0000 ug | Freq: Once | INTRAMUSCULAR | Status: AC
Start: 2020-10-11 — End: 2020-10-11
  Administered 2020-10-11: 1000 ug via INTRAMUSCULAR

## 2020-10-11 NOTE — Progress Notes (Addendum)
I,[name],acting as a Education administrator for [employer], NP.,have documented all relevant documentation on the behalf of [employer], NP,as directed by  Bary Castilla, NP while in the presence of Bary Castilla, NP.  This visit occurred during the SARS-CoV-2 public health emergency.  Safety protocols were in place, including screening questions prior to the visit, additional usage of staff PPE, and extensive cleaning of exam room while observing appropriate contact time as indicated for disinfecting solutions.  Subjective:     Patient ID: [name] Wright , female    DOB: 01-25-1966 , 55 y.o.   MRN: [id number]   Chief Complaint  Patient presents with   Otalgia    HPI  Patient is here for ear pain. She also has some sinus congestion. It hurts when she swallows. She is not vaccinated with COVID because she cant get the vaccine. No fever, no SOB, no chest pain. Tenderness on her sinuses.  Otalgia  There is pain in the left ear. This is a new problem. The current episode started in the past 7 days. The problem occurs constantly. The problem has been gradually worsening. There has been no fever. The pain is at a severity of 5/10. The pain is mild. Associated symptoms include ear discharge and a sore throat. Pertinent negatives include no coughing or headaches. She has tried NSAIDs for the symptoms. The treatment provided mild relief.    Past Medical History:  Diagnosis Date   Acute cystitis    Allergic rhinitis    Childhood asthma    no problems as adult - no inhaler   Chronic insomnia    Chronic pain due to trauma 02/2001   closed head trauma - Followed [facility]   Complication of anesthesia    Dyspepsia    Head trauma 02/24/2001   closed   History of kidney stones    passed stone - no surgery required   Hypothyroidism    PONV (postoperative nausea and vomiting)    Pre-diabetes    SVD (spontaneous vaginal delivery)    fetal  demise at 5 months     Family History  Problem Relation Age of Onset   Heart Problems Father    Ovarian cancer Maternal Grandmother    Multiple sclerosis Mother    Lung cancer Maternal Grandfather      Current Outpatient Medications:    ciprofloxacin (CIPRO) 500 MG tablet, Take 1 tablet (500 mg total) by mouth 2 (two) times daily for 10 days., Disp: 20 tablet, Rfl: 0   albuterol (PROAIR HFA) 108 (90 Base) MCG/ACT inhaler, Inhale 2 puffs into the lungs every 4 (four) hours as needed for wheezing or shortness of breath., Disp: 1 Inhaler, Rfl: 1   baclofen (LIORESAL) 10 MG tablet, Take 5-10 mg by mouth every 8 (eight) hours as needed for muscle spasms. , Disp: , Rfl:    baclofen (LIORESAL) 10 MG tablet, Take by mouth., Disp: , Rfl:    diphenhydrAMINE (BENADRYL) 25 mg capsule, Take 25 mg by mouth every 6 (six) hours as needed for allergies. , Disp: , Rfl:    famotidine (PEPCID) 20 MG tablet, Take 1 tablet (20 mg total) by mouth 2 (two) times daily., Disp: 60 tablet, Rfl: 1   fluticasone (FLONASE) 50 MCG/ACT nasal spray, Place 1 spray into both nostrils in the morning., Disp: , Rfl:    ibuprofen (ADVIL,MOTRIN) 200 MG tablet, Take 600 mg by mouth every 8 (eight) hours as needed for headache or  moderate pain., Disp: , Rfl:    levothyroxine (SYNTHROID) 100 MCG tablet, TAKE 1 TABLET BY MOUTH BEFORE BREAKFAST (Patient taking differently: Take 100 mcg by mouth daily before breakfast.), Disp: 90 tablet, Rfl: 0   losartan (COZAAR) 25 MG tablet, Take 1 tablet (25 mg total) by mouth daily., Disp: 30 tablet, Rfl: 11   meclizine (ANTIVERT) 25 MG tablet, Take 12.5-25 mg by mouth 2 (two) times daily as needed for dizziness (vertigo)., Disp: , Rfl:    methocarbamol (ROBAXIN) 500 MG tablet, Take 500 mg by mouth every 6 (six) hours as needed for muscle spasms., Disp: , Rfl:    methocarbamol (ROBAXIN) 500 MG tablet, Take by mouth., Disp: , Rfl:    morphine (MSIR) 15 MG tablet, Take 15 mg by mouth 4 (four) times  daily as needed for severe pain. , Disp: , Rfl:    Multiple Vitamins-Minerals (MAXIMUM DAILY GREEN PO), Take 1 Package by mouth 3 (three) times a week. It Works! Greens, Disp: , Rfl:    promethazine (PHENERGAN) 25 MG tablet, TAKE 1/2 TO 1 (ONE-HALF TO ONE) TABLET BY MOUTH EVERY 8 HOURS AS NEEDED FOR NAUSEA, Disp: 40 tablet, Rfl: 0   promethazine (PHENERGAN) 25 MG tablet, Take by mouth., Disp: , Rfl:    RESTASIS 0.05 % ophthalmic emulsion, Place 1 drop into both eyes 2 (two) times daily., Disp: , Rfl:    Sodium Sulfate-Mag Sulfate-KCl (SUTAB) [phone] MG TABS, Take 1 kit by mouth as directed. (Patient not taking: Reported on 10/05/2020), Disp: 24 tablet, Rfl: 0   SUDOGEST MAXIMUM STRENGTH 30 MG tablet, TAKE 1 TABLET BY MOUTH TWICE DAILY AS NEEDED FOR CONGESTION (Patient taking differently: Take 30 mg by mouth 2 (two) times daily as needed for congestion.), Disp: 90 tablet, Rfl: 0   traMADol (ULTRAM) 50 MG tablet, TAKE 1 TABLET BY MOUTH EVERY 6 HOURS AS NEEDED (Patient taking differently: Take 50 mg by mouth in the morning, at noon, and at bedtime.), Disp: 30 tablet, Rfl: 0   Allergies  Allergen Reactions   Doxycycline Anaphylaxis, Diarrhea and Nausea And Vomiting    headache   Oxycodone-Acetaminophen Hives and Nausea And Vomiting   Prochlorperazine Edisylate Other (See Comments)    Hyper and jitteriness   Amoxicillin Rash and Other (See Comments)    Has patient had a PCN reaction causing immediate rash, facial/tongue/throat swelling, SOB or lightheadedness with hypotension: Unknown Has patient had a PCN reaction causing severe rash involving mucus membranes or skin necrosis: Unknown Has patient had a PCN reaction that required hospitalization: No Has patient had a PCN reaction occurring within the last 10 years: Yes If all of the above answers are "NO", then may proceed with Cephalosporin use.    Compazine Other (See Comments)    Hyper/jitters/blotches   Eszopiclone Other (See Comments)     Metallic taste and became un effective    Keppra [Levetiracetam] Other (See Comments)    hyperactive   Latex    Levetiracetam Other (See Comments)    Jitters/hyper   Neurontin [Gabapentin] Nausea And Vomiting   Propoxyphene Hives   Tape     PAPER TAPE-skin burns/severe irritation  PATIENT DOES NOT TOLERATE PAPER TAPE   Trileptal [Oxcarbazepine] Other (See Comments)    REACTION: immune system suppressed   Bactrim [Sulfamethoxazole-Trimethoprim] Rash   Bupropion Rash   Codeine Rash   Hydrocodone-Acetaminophen Rash   Moxifloxacin Swelling and Rash   Propoxyphene N-Acetaminophen Nausea And Vomiting and Rash   Vicodin [Hydrocodone-Acetaminophen] Rash  Review of Systems  Constitutional: Negative.  Negative for chills, fatigue and fever.  HENT:  Positive for ear discharge, ear pain, sinus pressure, sinus pain and sore throat.   Eyes:  Negative for pain.  Respiratory: Negative.  Negative for cough, choking, shortness of breath and wheezing.   Cardiovascular: Negative.  Negative for chest pain and palpitations.  Gastrointestinal: Negative.   Musculoskeletal:  Negative for arthralgias and myalgias.  Neurological: Negative.  Negative for numbness and headaches.    Today's Vitals   10/11/20 1402  BP: 132/80  Pulse: 92  Temp: 97.9 F (36.6 C)  TempSrc: Oral  Weight: (!) 369 lb (167.4 kg)  Height: 5' 5" (1.651 m)   Body mass index is 61.4 kg/m.  Wt Readings from Last 3 Encounters:  10/11/20 (!) 369 lb (167.4 kg)  10/11/20 (!) 369 lb 6.4 oz (167.6 kg)  10/05/20 (!) 371 lb 12.8 oz (168.6 kg)    Objective:  Physical Exam Constitutional:      Appearance: Normal appearance.  HENT:     Head: Normocephalic and atraumatic.     Nose: No congestion or rhinorrhea.     Mouth/Throat:     Mouth: Mucous membranes are dry.  Cardiovascular:     Rate and Rhythm: Normal rate and regular rhythm.     Pulses: Normal pulses.     Heart sounds: Normal heart sounds. No murmur  heard. Pulmonary:     Effort: Pulmonary effort is normal. No respiratory distress.     Breath sounds: Normal breath sounds. No wheezing.  Skin:    General: Skin is warm and dry.     Capillary Refill: Capillary refill takes less than 2 seconds.  Neurological:     Mental Status: She is alert.        Assessment And Plan:     1. Acute otitis media, left - ciprofloxacin (CIPRO) 500 MG tablet; Take 1 tablet (500 mg total) by mouth 2 (two) times daily for 10 days.  Dispense: 20 tablet; Refill: 0 -Patient has taken Cipro before without any reactions.   2. Nasal sinus congestion - ciprofloxacin (CIPRO) 500 MG tablet; Take 1 tablet (500 mg total) by mouth 2 (two) times daily for 10 days.  Dispense: 20 tablet; Refill: 0 -Advised patient to take OTC medications as needed for sinus congestion such as Mucinex.  -Advised patient to stay well hydrated with lots of water.  - POC COVID-19 - POCT rapid strep A - Novel Coronavirus, NAA ([facility])   3. Class 3 severe obesity due to excess calories with serious comorbidity and body mass index (BMI) of 60.0 to 69.9 in adult [facility])  -Advised patient on a healthy diet including avoiding fast food and red meats. Increase the intake of lean meats including grilled chicken and [place].  Drink a lot of water. Decrease intake of fatty foods. Exercise for 30-45 min. 4-5 a week to decrease the risk of cardiac event.   The patient was encouraged to call or send a message through [place] for any questions or concerns.   Follow up: if symptoms persist or do not get better.   Side effects and appropriate use of all the medication(s) were discussed with the patient today. Patient advised to use the medication(s) as directed by their healthcare provider. The patient was encouraged to read, review, and understand all associated package inserts and contact our office with any questions or concerns. The patient accepts the risks of the treatment plan and had an opportunity to  ask  questions.   Patient was given opportunity to ask questions. Patient verbalized understanding of the plan and was able to repeat key elements of the plan. All questions were answered to their satisfaction.  Raman Ghumman, DNP   I, Raman Ghumman have reviewed all documentation for this visit. The documentation on 08/18/20 for the exam, diagnosis, procedures, and orders are all accurate and complete.    IF YOU HAVE BEEN REFERRED TO A SPECIALIST, IT MAY TAKE 1-2 WEEKS TO SCHEDULE/PROCESS THE REFERRAL. IF YOU HAVE NOT HEARD FROM US/SPECIALIST IN TWO WEEKS, PLEASE GIVE [place] A CALL AT [phone].   THE PATIENT IS ENCOURAGED TO PRACTICE SOCIAL DISTANCING DUE TO THE COVID-19 PANDEMIC.

## 2020-10-11 NOTE — Patient Instructions (Signed)
Earache, Adult An earache, or ear pain, can be caused by many things, including: An infection. Ear wax buildup. Ear pressure. Something in the ear that should not be there (foreign body). A sore throat. Tooth problems. Jaw problems. Treatment of the earache will depend on the cause. If the cause is not clear or cannot be determined, you may need to watch your symptoms until your earache goes away or until a cause is found. Follow these instructions at home: Medicines Take or apply over-the-counter and prescription medicines only as told by your health care provider. If you were prescribed an antibiotic medicine, use it as told by your health care provider. Do not stop using the antibiotic even if you start to feel better. Do not put anything in your ear other than medicine that is prescribed by your health care provider. Managing pain If directed, apply heat to the affected area as often as told by your health care provider. Use the heat source that your health care provider recommends, such as a moist heat pack or a heating pad. Place a towel between your skin and the heat source. Leave the heat on for 20-30 minutes. Remove the heat if your skin turns bright red. This is especially important if you are unable to feel pain, heat, or cold. You may have a greater risk of getting burned. If directed, put ice on the affected area as often as told by your health care provider. To do this:   Put ice in a plastic bag. Place a towel between your skin and the bag. Leave the ice on for 20 minutes, 2-3 times a day. General instructions Pay attention to any changes in your symptoms. Try resting in an upright position instead of lying down. This may help to reduce pressure in your ear and relieve pain. Chew gum if it helps to relieve your ear pain. Treat any allergies as told by your health care provider. Drink enough fluid to keep your urine pale yellow. It is up to you to get the results of any  tests that were done. Ask your health care provider, or the department that is doing the tests, when your results will be ready. Keep all follow-up visits as told by your health care provider. This is important. Contact a health care provider if: Your pain does not improve within 2 days. Your earache gets worse. You have new symptoms. You have a fever. Get help right away if you: Have a severe headache. Have a stiff neck. Have trouble swallowing. Have redness or swelling behind your ear. Have fluid or blood coming from your ear. Have hearing loss. Feel dizzy. Summary An earache, or ear pain, can be caused by many things. Treatment of the earache will depend on the cause. Follow recommendations from your health care provider to treat your ear pain. If the cause is not clear or cannot be determined, you may need to watch your symptoms until your earache goes away or until a cause is found. Keep all follow-up visits as told by your health care provider. This is important. This information is not intended to replace advice given to you by your health care provider. Make sure you discuss any questions you have with your health care provider. Document Revised: 11/01/2018 Document Reviewed: 11/01/2018 Elsevier Patient Education  2022 Elsevier Inc.  

## 2020-10-12 LAB — SARS-COV-2, NAA 2 DAY TAT

## 2020-10-12 LAB — NOVEL CORONAVIRUS, NAA: SARS-CoV-2, NAA: NOT DETECTED

## 2020-10-14 ENCOUNTER — Encounter: Payer: Self-pay | Admitting: Internal Medicine

## 2020-10-18 ENCOUNTER — Ambulatory Visit (INDEPENDENT_AMBULATORY_CARE_PROVIDER_SITE_OTHER): Payer: Medicare Other

## 2020-10-18 ENCOUNTER — Other Ambulatory Visit: Payer: Self-pay

## 2020-10-18 VITALS — BP 132/84 | HR 86 | Temp 98.3°F | Ht 65.0 in | Wt 370.0 lb

## 2020-10-18 DIAGNOSIS — E538 Deficiency of other specified B group vitamins: Secondary | ICD-10-CM | POA: Diagnosis not present

## 2020-10-18 MED ORDER — CYANOCOBALAMIN 1000 MCG/ML IJ SOLN
1000.0000 ug | Freq: Once | INTRAMUSCULAR | Status: AC
  Administered 2020-10-18: 1000 ug via INTRAMUSCULAR

## 2020-10-18 NOTE — Progress Notes (Signed)
Pt is here today for b12 injection and BPC. Pt said she has been feeling really dizzy. The dizziness she said started a little after starting med on 10/05/2020. She says the dizzy spells hit her after waking up from bed, getting up too fast. Pt took upon herself to spilt the pill in half and take it once a day. Being she thinks that is what is causing the dizziness. During this time she also has been taking an antibiotic for her ear infection. (Cipro). She started the half pill 4 days ago, stating she feels a lot better with dizzy feeling. She did not take any medications before coming to appt today. She did take the losartan last night between 8/9. She stated she also has stopped taking her fluid pill  days ago.  BP Readings from Last 3 Encounters:  10/18/20 132/84  10/11/20 132/80  10/11/20 132/80    Pt will come back as followed for next f/u appt.

## 2020-10-24 ENCOUNTER — Other Ambulatory Visit: Payer: Self-pay

## 2020-10-24 ENCOUNTER — Ambulatory Visit (INDEPENDENT_AMBULATORY_CARE_PROVIDER_SITE_OTHER): Payer: Medicare Other

## 2020-10-24 VITALS — BP 132/78 | HR 94 | Temp 98.3°F | Ht 65.0 in | Wt 368.4 lb

## 2020-10-24 DIAGNOSIS — E538 Deficiency of other specified B group vitamins: Secondary | ICD-10-CM | POA: Diagnosis not present

## 2020-10-24 MED ORDER — CYANOCOBALAMIN 1000 MCG/ML IJ SOLN
1000.0000 ug | Freq: Once | INTRAMUSCULAR | Status: AC
  Administered 2020-10-24: 1000 ug via INTRAMUSCULAR

## 2020-10-24 NOTE — Progress Notes (Signed)
Pt here for weekly B12 injection per Dr Baird Cancer.   B12 1027mcg given IM, and pt tolerated injection well.  Next B12 injection scheduled for 10/31/2020.

## 2020-10-31 ENCOUNTER — Other Ambulatory Visit: Payer: Self-pay

## 2020-10-31 ENCOUNTER — Ambulatory Visit (INDEPENDENT_AMBULATORY_CARE_PROVIDER_SITE_OTHER): Payer: Medicare Other

## 2020-10-31 VITALS — BP 132/78 | HR 68 | Temp 97.3°F | Ht 65.0 in | Wt 369.2 lb

## 2020-10-31 DIAGNOSIS — E538 Deficiency of other specified B group vitamins: Secondary | ICD-10-CM

## 2020-10-31 MED ORDER — CYANOCOBALAMIN 1000 MCG/ML IJ SOLN
1000.0000 ug | Freq: Once | INTRAMUSCULAR | Status: AC
  Administered 2020-10-31: 1000 ug via INTRAMUSCULAR

## 2020-10-31 NOTE — Progress Notes (Signed)
Pt here for weekly B12 injection per   B12 1038mg given IM, and pt tolerated injection well.  Next B12 injection scheduled for 11/07/2020.

## 2020-11-07 ENCOUNTER — Other Ambulatory Visit: Payer: Self-pay

## 2020-11-07 ENCOUNTER — Ambulatory Visit: Payer: Medicare Other

## 2020-11-07 VITALS — BP 134/78 | HR 90 | Temp 98.1°F | Ht 65.0 in | Wt 371.0 lb

## 2020-11-07 DIAGNOSIS — E538 Deficiency of other specified B group vitamins: Secondary | ICD-10-CM

## 2020-11-09 ENCOUNTER — Other Ambulatory Visit: Payer: Self-pay | Admitting: Internal Medicine

## 2020-11-29 ENCOUNTER — Ambulatory Visit: Payer: Medicare Other | Admitting: Internal Medicine

## 2020-12-07 ENCOUNTER — Other Ambulatory Visit: Payer: Self-pay

## 2020-12-07 ENCOUNTER — Ambulatory Visit (INDEPENDENT_AMBULATORY_CARE_PROVIDER_SITE_OTHER): Payer: Medicare Other | Admitting: Internal Medicine

## 2020-12-07 ENCOUNTER — Encounter: Payer: Self-pay | Admitting: Internal Medicine

## 2020-12-07 VITALS — BP 134/80 | HR 85 | Temp 98.1°F | Ht 63.6 in | Wt 368.4 lb

## 2020-12-07 DIAGNOSIS — D126 Benign neoplasm of colon, unspecified: Secondary | ICD-10-CM | POA: Diagnosis not present

## 2020-12-07 DIAGNOSIS — E538 Deficiency of other specified B group vitamins: Secondary | ICD-10-CM | POA: Diagnosis not present

## 2020-12-07 DIAGNOSIS — Z79899 Other long term (current) drug therapy: Secondary | ICD-10-CM

## 2020-12-07 DIAGNOSIS — E1165 Type 2 diabetes mellitus with hyperglycemia: Secondary | ICD-10-CM

## 2020-12-07 DIAGNOSIS — Z6841 Body Mass Index (BMI) 40.0 and over, adult: Secondary | ICD-10-CM

## 2020-12-07 MED ORDER — CYANOCOBALAMIN 1000 MCG/ML IJ SOLN
1000.0000 ug | Freq: Once | INTRAMUSCULAR | Status: AC
  Administered 2020-12-07: 1000 ug via INTRAMUSCULAR

## 2020-12-07 NOTE — Progress Notes (Signed)
I,Yamilka Roman [employer] as a Education administrator for Maximino Greenland, MD.,have documented all relevant documentation on the behalf of Maximino Greenland, MD,as directed by  Maximino Greenland, MD while in the presence of Maximino Greenland, MD.  This visit occurred during the SARS-CoV-2 public health emergency.  Safety protocols were in place, including screening questions prior to the visit, additional usage of staff PPE, and extensive cleaning of exam room while observing appropriate contact time as indicated for disinfecting solutions.  Subjective:     Patient ID: [name] Wright , female    DOB: 1966/01/21 , 55 y.o.   MRN: [id number]   Chief Complaint  Patient presents with   Diabetes   Hypertension    HPI  Patient presents today for a diabetes f/u. She was recently started on ozempic. Pt admits to not receiving a dose she stated she forgot to take the lid off the needle before administering it.   Diabetes She presents for her follow-up diabetic visit. She has type 2 diabetes mellitus. Pertinent negatives for diabetes include no polydipsia, no polyphagia and no polyuria.  Hypertension    Past Medical History:  Diagnosis Date   Acute cystitis    Allergic rhinitis    Childhood asthma    no problems as adult - no inhaler   Chronic insomnia    Chronic pain due to trauma 02/2001   closed head trauma - [facility]   Complication of anesthesia    Dyspepsia    Head trauma 02/24/2001   closed   History of kidney stones    passed stone - no surgery required   Hypothyroidism    PONV (postoperative nausea and vomiting)    Pre-diabetes    SVD (spontaneous vaginal delivery)    fetal demise at 5 months     Family History  Problem Relation Age of Onset   Heart Problems Father    Ovarian cancer Maternal Grandmother    Multiple sclerosis Mother    Lung cancer Maternal Grandfather      Current Outpatient Medications:    albuterol (PROAIR HFA)  108 (90 Base) MCG/ACT inhaler, Inhale 2 puffs into the lungs every 4 (four) hours as needed for wheezing or shortness of breath., Disp: 1 Inhaler, Rfl: 1   baclofen (LIORESAL) 10 MG tablet, Take 5-10 mg by mouth every 8 (eight) hours as needed for muscle spasms. , Disp: , Rfl:    baclofen (LIORESAL) 10 MG tablet, Take by mouth., Disp: , Rfl:    diphenhydrAMINE (BENADRYL) 25 mg capsule, Take 25 mg by mouth every 6 (six) hours as needed for allergies. , Disp: , Rfl:    famotidine (PEPCID) 20 MG tablet, Take 1 tablet (20 mg total) by mouth 2 (two) times daily., Disp: 60 tablet, Rfl: 1   fluticasone (FLONASE) 50 MCG/ACT nasal spray, Place 1 spray into both nostrils in the morning., Disp: , Rfl:    ibuprofen (ADVIL,MOTRIN) 200 MG tablet, Take 600 mg by mouth every 8 (eight) hours as needed for headache or moderate pain., Disp: , Rfl:    levothyroxine (SYNTHROID) 100 MCG tablet, TAKE 1 TABLET BY MOUTH BEFORE BREAKFAST, Disp: 90 tablet, Rfl: 0   losartan (COZAAR) 25 MG tablet, Take 1 tablet (25 mg total) by mouth daily., Disp: 30 tablet, Rfl: 11   meclizine (ANTIVERT) 25 MG tablet, Take 12.5-25 mg by mouth 2 (two) times daily as needed for dizziness (vertigo)., Disp: , Rfl:    methocarbamol (  ROBAXIN) 500 MG tablet, Take 500 mg by mouth every 6 (six) hours as needed for muscle spasms., Disp: , Rfl:    morphine (MSIR) 15 MG tablet, Take 15 mg by mouth 4 (four) times daily as needed for severe pain. , Disp: , Rfl:    Multiple Vitamins-Minerals (MAXIMUM DAILY GREEN PO), Take 1 Package by mouth 3 (three) times a week. It Works! Greens, Disp: , Rfl:    promethazine (PHENERGAN) 25 MG tablet, TAKE 1/2 TO 1 (ONE-HALF TO ONE) TABLET BY MOUTH EVERY 8 HOURS AS NEEDED FOR NAUSEA, Disp: 40 tablet, Rfl: 0   promethazine (PHENERGAN) 25 MG tablet, Take by mouth., Disp: , Rfl:    RESTASIS 0.05 % ophthalmic emulsion, Place 1 drop into both eyes 2 (two) times daily., Disp: , Rfl:    Sodium Sulfate-Mag Sulfate-KCl (SUTAB)  [id number] MG TABS, Take 1 kit by mouth as directed., Disp: 24 tablet, Rfl: 0   SUDOGEST MAXIMUM STRENGTH 30 MG tablet, TAKE 1 TABLET BY MOUTH TWICE DAILY AS NEEDED FOR CONGESTION (Patient not taking: Reported on 12/07/2020), Disp: 90 tablet, Rfl: 0   traMADol (ULTRAM) 50 MG tablet, TAKE 1 TABLET BY MOUTH EVERY 6 HOURS AS NEEDED (Patient not taking: No sig reported), Disp: 30 tablet, Rfl: 0   Allergies  Allergen Reactions   Doxycycline Anaphylaxis, Diarrhea and Nausea And Vomiting    headache   Oxycodone-Acetaminophen Hives and Nausea And Vomiting   Prochlorperazine Edisylate Other (See Comments)    Hyper and jitteriness   Amoxicillin Rash and Other (See Comments)    Has patient had a PCN reaction causing immediate rash, facial/tongue/throat swelling, SOB or lightheadedness with hypotension: Unknown Has patient had a PCN reaction causing severe rash involving mucus membranes or skin necrosis: Unknown Has patient had a PCN reaction that required hospitalization: No Has patient had a PCN reaction occurring within the last 10 years: Yes If all of the above answers are "NO", then may proceed with Cephalosporin use.    Compazine Other (See Comments)    Hyper/jitters/blotches   Eszopiclone Other (See Comments)    Metallic taste and became un effective    Keppra [Levetiracetam] Other (See Comments)    hyperactive   Latex    Levetiracetam Other (See Comments)    Jitters/hyper   Neurontin [Gabapentin] Nausea And Vomiting   Propoxyphene Hives   Tape     PAPER TAPE-skin burns/severe irritation  PATIENT DOES NOT TOLERATE PAPER TAPE   Trileptal [Oxcarbazepine] Other (See Comments)    REACTION: immune system suppressed   Bactrim [Sulfamethoxazole-Trimethoprim] Rash   Bupropion Rash   Codeine Rash   Hydrocodone-Acetaminophen Rash   Moxifloxacin Swelling and Rash   Propoxyphene N-Acetaminophen Nausea And Vomiting and Rash   Vicodin [Hydrocodone-Acetaminophen] Rash     Review of Systems   Constitutional: Negative.   Respiratory: Negative.    Cardiovascular: Negative.   Gastrointestinal: Negative.   Endocrine: Negative for polydipsia, polyphagia and polyuria.  Neurological: Negative.   Psychiatric/Behavioral: Negative.      Today's Vitals   12/07/20 1501  BP: 134/80  Pulse: 85  Temp: 98.1 F (36.7 C)  Weight: (!) 368 lb 6.4 oz (167.1 kg)  Height: 5' 3.6" (1.615 m)   Body mass index is 64.03 kg/m.  Wt Readings from Last 3 Encounters:  12/07/20 (!) 368 lb 6.4 oz (167.1 kg)  11/07/20 (!) 371 lb (168.3 kg)  10/31/20 (!) 369 lb 3.2 oz (167.5 kg)     Objective:  Physical Exam Vitals and nursing note  reviewed.  Constitutional:      Appearance: Normal appearance. She is obese.  HENT:     Head: Normocephalic and atraumatic.     Nose:     Comments: Masked     Mouth/Throat:     Comments: Masked  Eyes:     Extraocular Movements: Extraocular movements intact.  Cardiovascular:     Rate and Rhythm: Normal rate and regular rhythm.     Heart sounds: Normal heart sounds.  Pulmonary:     Effort: Pulmonary effort is normal.     Breath sounds: Normal breath sounds.  Musculoskeletal:     Cervical back: Normal range of motion.  Skin:    General: Skin is warm.  Neurological:     General: No focal deficit present.     Mental Status: She is alert.  Psychiatric:        Mood and Affect: Mood normal.        Behavior: Behavior normal.        Assessment And Plan:     1. Uncontrolled type 2 diabetes mellitus with hyperglycemia (HCC) Comments: Chronic, I will check a1c at next visit. I will increase Ozempic to 0.33m weekly. I will also refer her to CCM for med assistance.  - [place] 2. B12 deficiency - AMB Ref[facility] - cyanocobalamin ((VITAMIN B-12)) injection 1,000 mcg  3. Tubular adenoma of colon Comments: Colonoscopy results reviewed in detail. I will refer her for genetics evaluation per GI. -  Ambulatory referral to Genetics - [place] 4. Class 3 severe obesity due to excess calories with serious comorbidity and body mass index (BMI) of 60.0 to 69.9 in adult ([facility] Comments: BMI 64. She was congratulated on her 3 lbs weight loss. She is encouraged to increase her daily activity.   5. Polypharmacy - [facility]  The [facility] process and benefits of testing was discussed with the patient. After the discussion, the patient made an informed consent to testing and the sample was collected and mailed to the external lab.   Patient was given opportunity to ask questions. Patient verbalized understanding of the plan and was able to repeat key elements of the plan. All questions were answered to their satisfaction.   I, RMaximino Greenland MD, have reviewed all documentation for this visit. The documentation on 12/07/20 for the exam, diagnosis, procedures, and orders are all accurate and complete.   IF YOU HAVE BEEN REFERRED TO A SPECIALIST, IT MAY TAKE 1-2 WEEKS TO SCHEDULE/PROCESS THE REFERRAL. IF YOU HAVE NOT HEARD FROM US/SPECIALIST IN TWO WEEKS, PLEASE GIVE [place] CALL AT [phone] X [phone].   THE PATIENT IS ENCOURAGED TO PRACTICE SOCIAL DISTANCING DUE TO THE COVID-19 PANDEMIC.

## 2020-12-07 NOTE — Patient Instructions (Signed)
Diabetes Mellitus and Nutrition, Adult When you have diabetes, or diabetes mellitus, it is very important to have healthy eating habits because your blood sugar (glucose) levels are greatly affected by what you eat and drink. Eating healthy foods in the right amounts, at about the same times every day, can help you:  Control your blood glucose.  Lower your risk of heart disease.  Improve your blood pressure.  Reach or maintain a healthy weight. What can affect my meal plan? Every person with diabetes is different, and each person has different needs for a meal plan. Your health care provider may recommend that you work with a dietitian to make a meal plan that is best for you. Your meal plan may vary depending on factors such as:  The calories you need.  The medicines you take.  Your weight.  Your blood glucose, blood pressure, and cholesterol levels.  Your activity level.  Other health conditions you have, such as heart or kidney disease. How do carbohydrates affect me? Carbohydrates, also called carbs, affect your blood glucose level more than any other type of food. Eating carbs naturally raises the amount of glucose in your blood. Carb counting is a method for keeping track of how many carbs you eat. Counting carbs is important to keep your blood glucose at a healthy level, especially if you use insulin or take certain oral diabetes medicines. It is important to know how many carbs you can safely have in each meal. This is different for every person. Your dietitian can help you calculate how many carbs you should have at each meal and for each snack. How does alcohol affect me? Alcohol can cause a sudden decrease in blood glucose (hypoglycemia), especially if you use insulin or take certain oral diabetes medicines. Hypoglycemia can be a life-threatening condition. Symptoms of hypoglycemia, such as sleepiness, dizziness, and confusion, are similar to symptoms of having too much  alcohol.  Do not drink alcohol if: ? Your health care provider tells you not to drink. ? You are pregnant, may be pregnant, or are planning to become pregnant.  If you drink alcohol: ? Do not drink on an empty stomach. ? Limit how much you use to:  0-1 drink a day for women.  0-2 drinks a day for men. ? Be aware of how much alcohol is in your drink. In the [place] one drink equals one 12 oz bottle of beer (355 mL), one 5 oz glass of wine (148 mL), or one 1 oz glass of hard liquor (44 mL). ? Keep yourself hydrated with water, diet soda, or unsweetened iced tea.  Keep in mind that regular soda, juice, and other mixers may contain a lot of sugar and must be counted as carbs. What are tips for following this plan? Reading food labels  Start by checking the serving size on the "Nutrition Facts" label of packaged foods and drinks. The amount of calories, carbs, fats, and other nutrients listed on the label is based on one serving of the item. Many items contain more than one serving per package.  Check the total grams (g) of carbs in one serving. You can calculate the number of servings of carbs in one serving by dividing the total carbs by 15. For example, if a food has 30 g of total carbs per serving, it would be equal to 2 servings of carbs.  Check the number of grams (g) of saturated fats and trans fats in one serving. Choose foods that have   a low amount or none of these fats.  Check the number of milligrams (mg) of salt (sodium) in one serving. Most people should limit total sodium intake to less than 2,300 mg per day.  Always check the nutrition information of foods labeled as "low-fat" or "nonfat." These foods may be higher in added sugar or refined carbs and should be avoided.  Talk to your dietitian to identify your daily goals for nutrients listed on the label. Shopping  Avoid buying canned, pre-made, or processed foods. These foods tend to be high in fat, sodium, and added  sugar.  Shop around the outside edge of the grocery store. This is where you will most often find fresh fruits and vegetables, bulk grains, fresh meats, and fresh dairy. Cooking  Use low-heat cooking methods, such as baking, instead of high-heat cooking methods like deep frying.  Cook using healthy oils, such as olive, canola, or sunflower oil.  Avoid cooking with butter, cream, or high-fat meats. Meal planning  Eat meals and snacks regularly, preferably at the same times every day. Avoid going long periods of time without eating.  Eat foods that are high in fiber, such as fresh fruits, vegetables, beans, and whole grains. Talk with your dietitian about how many servings of carbs you can eat at each meal.  Eat 4-6 oz (112-168 g) of lean protein each day, such as lean meat, chicken, fish, eggs, or tofu. One ounce (oz) of lean protein is equal to: ? 1 oz (28 g) of meat, chicken, or fish. ? 1 egg. ?  cup (62 g) of tofu.  Eat some foods each day that contain healthy fats, such as avocado, nuts, seeds, and fish.   What foods should I eat? Fruits Berries. Apples. Oranges. Peaches. Apricots. Plums. Grapes. Mango. Papaya. Pomegranate. Kiwi. Cherries. Vegetables Lettuce. Spinach. Leafy greens, including kale, chard, collard greens, and mustard greens. Beets. Cauliflower. Cabbage. Broccoli. Carrots. Green beans. Tomatoes. Peppers. Onions. Cucumbers. Brussels sprouts. Grains Whole grains, such as whole-wheat or whole-grain bread, crackers, tortillas, cereal, and pasta. Unsweetened oatmeal. Quinoa. Brown or wild rice. Meats and other proteins Seafood. Poultry without skin. Lean cuts of poultry and beef. Tofu. Nuts. Seeds. Dairy Low-fat or fat-free dairy products such as milk, yogurt, and cheese. The items listed above may not be a complete list of foods and beverages you can eat. Contact a dietitian for more information. What foods should I avoid? Fruits Fruits canned with  syrup. Vegetables Canned vegetables. Frozen vegetables with butter or cream sauce. Grains Refined white flour and flour products such as bread, pasta, snack foods, and cereals. Avoid all processed foods. Meats and other proteins Fatty cuts of meat. Poultry with skin. Breaded or fried meats. Processed meat. Avoid saturated fats. Dairy Full-fat yogurt, cheese, or milk. Beverages Sweetened drinks, such as soda or iced tea. The items listed above may not be a complete list of foods and beverages you should avoid. Contact a dietitian for more information. Questions to ask a health care provider  Do I need to meet with a diabetes educator?  Do I need to meet with a dietitian?  What number can I call if I have questions?  When are the best times to check my blood glucose? Where to find more information:  American Diabetes Association: diabetes.org  Academy of Nutrition and Dietetics: www.eatright.org  National Institute of Diabetes and Digestive and Kidney Diseases: www.niddk.nih.gov  Association of Diabetes Care and Education Specialists: www.diabeteseducator.org Summary  It is important to have healthy eating   habits because your blood sugar (glucose) levels are greatly affected by what you eat and drink.  A healthy meal plan will help you control your blood glucose and maintain a healthy lifestyle.  Your health care provider may recommend that you work with a dietitian to make a meal plan that is best for you.  Keep in mind that carbohydrates (carbs) and alcohol have immediate effects on your blood glucose levels. It is important to count carbs and to use alcohol carefully. This information is not intended to replace advice given to you by your health care provider. Make sure you discuss any questions you have with your health care provider. Document Revised: 03/03/2019 Document Reviewed: 03/03/2019 Elsevier Patient Education  2021 Elsevier Inc.  

## 2020-12-08 ENCOUNTER — Telehealth: Payer: Self-pay | Admitting: *Deleted

## 2020-12-08 NOTE — Chronic Care Management (AMB) (Signed)
  Chronic Care Management   Outreach Note  12/08/2020 Name: [name] Wright MRN: [id number] DOB: Nov 07, 1965  [name] Wright is a 55 y.o. year old female who is a primary care patient of [name], MD. I reached out to [name] Wright by phone today in response to a referral sent by [name] Wright's PCP, Dr. Baird Cancer.      An unsuccessful telephone outreach was attempted today. The patient was referred to the case management team for assistance with care management and care coordination.   Follow Up Plan: A HIPAA compliant phone message was left for the patient providing contact information and requesting a return call. The care management team will reach out to the patient again over the next 7 days.  If patient returns call to provider office, please advise to call [facility] at [phone].  [place] Management  Direct Dial: [phone]

## 2020-12-13 ENCOUNTER — Telehealth: Payer: Self-pay

## 2020-12-14 NOTE — Chronic Care Management (AMB) (Signed)
  Chronic Care Management   Outreach Note  12/14/2020 Name: ASHIYA BRUTUS MRN: [id number] DOB: 04-01-1966  MAEBRIE BORJAS is a 55 y.o. year old female who is a primary care patient of [name], MD. I reached out to Amedeo Plenty by phone today in response to a referral sent by Ms. Cornelia Copa Daffin's PCP, Dr. Baird Cancer.      A second unsuccessful telephone outreach was attempted today. The patient was referred to the case management team for assistance with care management and care coordination.   Follow Up Plan: A HIPAA compliant phone message was left for the patient providing contact information and requesting a return call. The care management team will reach out to the patient again over the next 7 days. If patient returns call to provider office, please advise to call [facility] at [phone].  [place] Management  Direct Dial: [phone]

## 2020-12-21 NOTE — Chronic Care Management (AMB) (Signed)
    Chronic Care Management Pharmacy Assistant   Name: MILIANNA KEIBLER  MRN: [id number] DOB: 08-Jan-1966  Reason for Encounter: Act X Pharmocogenic Study/ Patient Assistance Coordination  12/13/2020- The ActX pharmacogenomics process and benefits of testing was discussed with the patient. After the discussion, the patient made an informed consent to testing and the sample was collected and mailed to the external lab.  Dr. Baird Cancer also requested patient assistance application for Ozempic with [employer] patient assistance program. Application filled out and signed by patient in office, patient aware to return income documentation to office in order to complete patient assistance process. Dr. Baird Cancer signed application, awaiting patient income to fax.    Pattricia Boss, [place] Pharmacist Assistant [phone]

## 2020-12-21 NOTE — Chronic Care Management (AMB) (Signed)
  Chronic Care Management   Outreach Note  12/21/2020 Name: [name] Wright MRN: [id number] DOB: 01-13-1966  [name] Wright is a 55 y.o. year old female who is a primary care patient of [name] Chard, MD. I reached out to [name] Wright by phone today in response to a referral sent by [name] Wright's PCP, Dr. Baird Cancer.      Third unsuccessful telephone outreach was attempted today. The patient was referred to the case management team for assistance with care management and care coordination. The patient's primary care provider has been notified of our unsuccessful attempts to make or maintain contact with the patient. The care management team is pleased to engage with this patient at any time in the future should he/she be interested in assistance from the care management team.   Follow Up Plan: We have been unable to make contact with the patient. The care management team is available to follow up with the patient after provider conversation with the patient regarding recommendation for care management engagement and subsequent re-referral to the care management team. A HIPAA compliant phone message was left for the patient providing contact information and requesting a return call.   [place] Management  Direct Dial: [phone]

## 2020-12-27 ENCOUNTER — Encounter: Payer: Self-pay | Admitting: Internal Medicine

## 2021-01-02 ENCOUNTER — Ambulatory Visit (INDEPENDENT_AMBULATORY_CARE_PROVIDER_SITE_OTHER): Payer: Medicare Other

## 2021-01-02 ENCOUNTER — Other Ambulatory Visit: Payer: Self-pay

## 2021-01-02 VITALS — BP 142/80 | HR 104 | Temp 98.0°F | Ht 63.6 in | Wt 368.4 lb

## 2021-01-02 DIAGNOSIS — E538 Deficiency of other specified B group vitamins: Secondary | ICD-10-CM | POA: Diagnosis not present

## 2021-01-02 MED ORDER — CYANOCOBALAMIN 1000 MCG/ML IJ SOLN
1000.0000 ug | Freq: Once | INTRAMUSCULAR | Status: AC
  Administered 2021-01-02: 1000 ug via INTRAMUSCULAR

## 2021-01-02 NOTE — Progress Notes (Signed)
Patient presents today for a vitamin B12 injection YL,RMA

## 2021-01-04 ENCOUNTER — Encounter: Payer: Self-pay | Admitting: Nurse Practitioner

## 2021-01-04 ENCOUNTER — Telehealth (INDEPENDENT_AMBULATORY_CARE_PROVIDER_SITE_OTHER): Payer: Medicare Other | Admitting: Nurse Practitioner

## 2021-01-04 ENCOUNTER — Encounter: Payer: Self-pay | Admitting: Internal Medicine

## 2021-01-04 ENCOUNTER — Other Ambulatory Visit: Payer: Self-pay

## 2021-01-04 VITALS — Ht 63.6 in | Wt 368.0 lb

## 2021-01-04 DIAGNOSIS — E1165 Type 2 diabetes mellitus with hyperglycemia: Secondary | ICD-10-CM

## 2021-01-04 DIAGNOSIS — K219 Gastro-esophageal reflux disease without esophagitis: Secondary | ICD-10-CM | POA: Diagnosis not present

## 2021-01-04 DIAGNOSIS — E559 Vitamin D deficiency, unspecified: Secondary | ICD-10-CM

## 2021-01-04 MED ORDER — VITAMIN D (ERGOCALCIFEROL) 1.25 MG (50000 UNIT) PO CAPS
50000.0000 [IU] | ORAL_CAPSULE | ORAL | 0 refills | Status: DC
Start: 2021-01-04 — End: 2022-03-02

## 2021-01-04 MED ORDER — FAMOTIDINE 20 MG PO TABS: 20.0000 mg | ORAL_TABLET | Freq: Two times a day (BID) | ORAL | 1 refills | Status: DC

## 2021-01-04 NOTE — Patient Instructions (Signed)
Diabetes Mellitus and Nutrition, Adult When you have diabetes, or diabetes mellitus, it is very important to have healthy eating habits because your blood sugar (glucose) levels are greatly affected by what you eat and drink. Eating healthy foods in the right amounts, at about the same times every day, can help you:  Control your blood glucose.  Lower your risk of heart disease.  Improve your blood pressure.  Reach or maintain a healthy weight. What can affect my meal plan? Every person with diabetes is different, and each person has different needs for a meal plan. Your health care provider may recommend that you work with a dietitian to make a meal plan that is best for you. Your meal plan may vary depending on factors such as:  The calories you need.  The medicines you take.  Your weight.  Your blood glucose, blood pressure, and cholesterol levels.  Your activity level.  Other health conditions you have, such as heart or kidney disease. How do carbohydrates affect me? Carbohydrates, also called carbs, affect your blood glucose level more than any other type of food. Eating carbs naturally raises the amount of glucose in your blood. Carb counting is a method for keeping track of how many carbs you eat. Counting carbs is important to keep your blood glucose at a healthy level, especially if you use insulin or take certain oral diabetes medicines. It is important to know how many carbs you can safely have in each meal. This is different for every person. Your dietitian can help you calculate how many carbs you should have at each meal and for each snack. How does alcohol affect me? Alcohol can cause a sudden decrease in blood glucose (hypoglycemia), especially if you use insulin or take certain oral diabetes medicines. Hypoglycemia can be a life-threatening condition. Symptoms of hypoglycemia, such as sleepiness, dizziness, and confusion, are similar to symptoms of having too much  alcohol.  Do not drink alcohol if: ? Your health care provider tells you not to drink. ? You are pregnant, may be pregnant, or are planning to become pregnant.  If you drink alcohol: ? Do not drink on an empty stomach. ? Limit how much you use to:  0-1 drink a day for women.  0-2 drinks a day for men. ? Be aware of how much alcohol is in your drink. In the [place] one drink equals one 12 oz bottle of beer (355 mL), one 5 oz glass of wine (148 mL), or one 1 oz glass of hard liquor (44 mL). ? Keep yourself hydrated with water, diet soda, or unsweetened iced tea.  Keep in mind that regular soda, juice, and other mixers may contain a lot of sugar and must be counted as carbs. What are tips for following this plan? Reading food labels  Start by checking the serving size on the "Nutrition Facts" label of packaged foods and drinks. The amount of calories, carbs, fats, and other nutrients listed on the label is based on one serving of the item. Many items contain more than one serving per package.  Check the total grams (g) of carbs in one serving. You can calculate the number of servings of carbs in one serving by dividing the total carbs by 15. For example, if a food has 30 g of total carbs per serving, it would be equal to 2 servings of carbs.  Check the number of grams (g) of saturated fats and trans fats in one serving. Choose foods that have   a low amount or none of these fats.  Check the number of milligrams (mg) of salt (sodium) in one serving. Most people should limit total sodium intake to less than 2,300 mg per day.  Always check the nutrition information of foods labeled as "low-fat" or "nonfat." These foods may be higher in added sugar or refined carbs and should be avoided.  Talk to your dietitian to identify your daily goals for nutrients listed on the label. Shopping  Avoid buying canned, pre-made, or processed foods. These foods tend to be high in fat, sodium, and added  sugar.  Shop around the outside edge of the grocery store. This is where you will most often find fresh fruits and vegetables, bulk grains, fresh meats, and fresh dairy. Cooking  Use low-heat cooking methods, such as baking, instead of high-heat cooking methods like deep frying.  Cook using healthy oils, such as olive, canola, or sunflower oil.  Avoid cooking with butter, cream, or high-fat meats. Meal planning  Eat meals and snacks regularly, preferably at the same times every day. Avoid going long periods of time without eating.  Eat foods that are high in fiber, such as fresh fruits, vegetables, beans, and whole grains. Talk with your dietitian about how many servings of carbs you can eat at each meal.  Eat 4-6 oz (112-168 g) of lean protein each day, such as lean meat, chicken, fish, eggs, or tofu. One ounce (oz) of lean protein is equal to: ? 1 oz (28 g) of meat, chicken, or fish. ? 1 egg. ?  cup (62 g) of tofu.  Eat some foods each day that contain healthy fats, such as avocado, nuts, seeds, and fish.   What foods should I eat? Fruits Berries. Apples. Oranges. Peaches. Apricots. Plums. Grapes. Mango. Papaya. Pomegranate. Kiwi. Cherries. Vegetables Lettuce. Spinach. Leafy greens, including kale, chard, collard greens, and mustard greens. Beets. Cauliflower. Cabbage. Broccoli. Carrots. Green beans. Tomatoes. Peppers. Onions. Cucumbers. Brussels sprouts. Grains Whole grains, such as whole-wheat or whole-grain bread, crackers, tortillas, cereal, and pasta. Unsweetened oatmeal. Quinoa. Brown or wild rice. Meats and other proteins Seafood. Poultry without skin. Lean cuts of poultry and beef. Tofu. Nuts. Seeds. Dairy Low-fat or fat-free dairy products such as milk, yogurt, and cheese. The items listed above may not be a complete list of foods and beverages you can eat. Contact a dietitian for more information. What foods should I avoid? Fruits Fruits canned with  syrup. Vegetables Canned vegetables. Frozen vegetables with butter or cream sauce. Grains Refined white flour and flour products such as bread, pasta, snack foods, and cereals. Avoid all processed foods. Meats and other proteins Fatty cuts of meat. Poultry with skin. Breaded or fried meats. Processed meat. Avoid saturated fats. Dairy Full-fat yogurt, cheese, or milk. Beverages Sweetened drinks, such as soda or iced tea. The items listed above may not be a complete list of foods and beverages you should avoid. Contact a dietitian for more information. Questions to ask a health care provider  Do I need to meet with a diabetes educator?  Do I need to meet with a dietitian?  What number can I call if I have questions?  When are the best times to check my blood glucose? Where to find more information:  American Diabetes Association: diabetes.org  Academy of Nutrition and Dietetics: www.eatright.org  National Institute of Diabetes and Digestive and Kidney Diseases: www.niddk.nih.gov  Association of Diabetes Care and Education Specialists: www.diabeteseducator.org Summary  It is important to have healthy eating   habits because your blood sugar (glucose) levels are greatly affected by what you eat and drink.  A healthy meal plan will help you control your blood glucose and maintain a healthy lifestyle.  Your health care provider may recommend that you work with a dietitian to make a meal plan that is best for you.  Keep in mind that carbohydrates (carbs) and alcohol have immediate effects on your blood glucose levels. It is important to count carbs and to use alcohol carefully. This information is not intended to replace advice given to you by your health care provider. Make sure you discuss any questions you have with your health care provider. Document Revised: 03/03/2019 Document Reviewed: 03/03/2019 Elsevier Patient Education  2021 Elsevier Inc.  

## 2021-01-04 NOTE — Progress Notes (Signed)
Virtual Visit via 100%    This visit type was conducted due to national recommendations for restrictions regarding the COVID-19 Pandemic (e.g. social distancing) in an effort to limit this patient's exposure and mitigate transmission in our community.  Due to her co-morbid illnesses, this patient is at least at moderate risk for complications without adequate follow up.  This format is felt to be most appropriate for this patient at this time.  All issues noted in this document were discussed and addressed.  A limited physical exam was performed with this format.    This visit type was conducted due to national recommendations for restrictions regarding the COVID-19 Pandemic (e.g. social distancing) in an effort to limit this patient's exposure and mitigate transmission in our community.  Patients identity confirmed using two different identifiers.  This format is felt to be most appropriate for this patient at this time.  All issues noted in this document were discussed and addressed.  No physical exam was performed (except for noted visual exam findings with Video Visits).    Date:  01/04/2021   ID:  [name] Wright, DOB 01/04/1966, MRN [id number]  Patient Location:  Home   Provider location:   Office   Chief Complaint:  She feels   History of Present Illness:    [name] Wright is a 55 y.o. female who presents via video conferencing for a telehealth visit today.    The patient does not have symptoms concerning for COVID-19 infection (fever, chills, cough, or new shortness of breath).   She was having some issues with taking ozempic with the side-effects. She was on the .25 before and had no issues then she went up to 0.50 and started to have some side-effects such as N/V. She would like to go back on 0.25 and she how she does that and if she cant tolerate that then we are going to discontinue. Patient agrees to plan. No other questions asked.   Allergic Reaction    Past  Medical History:  Diagnosis Date   Acute cystitis    Allergic rhinitis    Childhood asthma    no problems as adult - no inhaler   Chronic insomnia    Chronic pain due to trauma 02/2001   closed head trauma - Followed by Dr [facility]   Complication of anesthesia    Dyspepsia    Head trauma 02/24/2001   closed   History of kidney stones    passed stone - no surgery required   Hypothyroidism    PONV (postoperative nausea and vomiting)    Pre-diabetes    SVD (spontaneous vaginal delivery)    fetal demise at 5 months   Past Surgical History:  Procedure Laterality Date   APPENDECTOMY     BIOPSY  08/25/2020   Procedure: BIOPSY;  Surgeon: Lavena Bullion, DO;  Location: [facility];  Service: Gastroenterology;;   COLONOSCOPY WITH PROPOFOL N/A 08/25/2020   Procedure: COLONOSCOPY WITH PROPOFOL;  Surgeon: Lavena Bullion, DO;  Location: [facility];  Service: Gastroenterology;  Laterality: N/A;   DILATATION & CURETTAGE/HYSTEROSCOPY WITH MYOSURE N/A 04/15/2017   Procedure: DILATATION & CURETTAGE/HYSTEROSCOPY WITH MYOSURE POLYPECTOMY;  Surgeon: Everlene Farrier, MD;  Location: [facility];  Service: Gynecology;  Laterality: N/A;   DILATATION & CURETTAGE/HYSTEROSCOPY WITH MYOSURE N/A 06/18/2019   Procedure: DILATATION & CURETTAGE/HYSTEROSCOPY WITH  MYOSURE;  Surgeon: Everlene Farrier, MD;  Location: [facility];  Service: Gynecology;  Laterality: N/A;   ESOPHAGOGASTRODUODENOSCOPY (EGD)  WITH PROPOFOL N/A 08/25/2020   Procedure: ESOPHAGOGASTRODUODENOSCOPY (EGD) WITH PROPOFOL;  Surgeon: Lavena Bullion, DO;  Location: [facility];  Service: Gastroenterology;  Laterality: N/A;   EYE SURGERY     cataracts removed   INCONTINENCE SURGERY     PILONIDAL CYST EXCISION     x2   POLYPECTOMY  08/25/2020   Procedure: POLYPECTOMY;  Surgeon: Lavena Bullion, DO;  Location: [facility];  Service: Gastroenterology;;   repair of toe laceration     dermabond to right big toe right  foot   repair of torn ligament right leg     patient denies this surgery   right foot fracture     cast only   TONSILLECTOMY     WISDOM TOOTH EXTRACTION       Current Meds  Medication Sig   albuterol (PROAIR HFA) 108 (90 Base) MCG/ACT inhaler Inhale 2 puffs into the lungs every 4 (four) hours as needed for wheezing or shortness of breath.   baclofen (LIORESAL) 10 MG tablet Take 5-10 mg by mouth every 8 (eight) hours as needed for muscle spasms.    baclofen (LIORESAL) 10 MG tablet Take by mouth.   diphenhydrAMINE (BENADRYL) 25 mg capsule Take 25 mg by mouth every 6 (six) hours as needed for allergies.    famotidine (PEPCID) 20 MG tablet Take 1 tablet (20 mg total) by mouth 2 (two) times daily.   fluticasone (FLONASE) 50 MCG/ACT nasal spray Place 1 spray into both nostrils in the morning.   ibuprofen (ADVIL,MOTRIN) 200 MG tablet Take 600 mg by mouth every 8 (eight) hours as needed for headache or moderate pain.   levothyroxine (SYNTHROID) 100 MCG tablet TAKE 1 TABLET BY MOUTH BEFORE BREAKFAST   losartan (COZAAR) 25 MG tablet Take 1 tablet (25 mg total) by mouth daily.   meclizine (ANTIVERT) 25 MG tablet Take 12.5-25 mg by mouth 2 (two) times daily as needed for dizziness (vertigo).   methocarbamol (ROBAXIN) 500 MG tablet Take 500 mg by mouth every 6 (six) hours as needed for muscle spasms.   morphine (MSIR) 15 MG tablet Take 15 mg by mouth 4 (four) times daily as needed for severe pain.    Multiple Vitamins-Minerals (MAXIMUM DAILY GREEN PO) Take 1 Package by mouth 3 (three) times a week. It Works! Greens   promethazine (PHENERGAN) 25 MG tablet TAKE 1/2 TO 1 (ONE-HALF TO ONE) TABLET BY MOUTH EVERY 8 HOURS AS NEEDED FOR NAUSEA   promethazine (PHENERGAN) 25 MG tablet Take by mouth.   RESTASIS 0.05 % ophthalmic emulsion Place 1 drop into both eyes 2 (two) times daily.   Sodium Sulfate-Mag Sulfate-KCl (SUTAB) [phone] MG TABS Take 1 kit by mouth as directed.   SUDOGEST MAXIMUM STRENGTH 30 MG  tablet TAKE 1 TABLET BY MOUTH TWICE DAILY AS NEEDED FOR CONGESTION     Allergies:   Doxycycline, Oxycodone-acetaminophen, Prochlorperazine edisylate, Amoxicillin, Compazine, Eszopiclone, Keppra [levetiracetam], Latex, Levetiracetam, Neurontin [gabapentin], Propoxyphene, Tape, Trileptal [oxcarbazepine], Bactrim [sulfamethoxazole-trimethoprim], Bupropion, Codeine, Hydrocodone-acetaminophen, Moxifloxacin, Propoxyphene n-acetaminophen, and Vicodin [hydrocodone-acetaminophen]   Social History   Tobacco Use   Smoking status: Never   Smokeless tobacco: Never  Vaping Use   Vaping Use: Never used  Substance Use Topics   Alcohol use: No    Comment: "1 drink/year"   Drug use: Yes    Types: Morphine    Comment: Morphine 15 mg tab 4xdaily prn     Family Hx: The patient's family history includes Heart Problems in her father; Lung cancer in her  maternal grandfather; Multiple sclerosis in her mother; Ovarian cancer in her maternal grandmother.  ROS:   Please see the history of present illness.    ROS  All other systems reviewed and are negative.   Labs/Other Tests and Data Reviewed:    Recent Labs: 07/14/2020: ALT 15; BUN 12; Creatinine, Ser 0.68; Hemoglobin 13.7; Platelets 283.0; Potassium 4.1; Sodium 139 10/05/2020: TSH 1.460   Recent Lipid Panel Lab Results  Component Value Date/Time   CHOL 178 10/05/2020 11:22 AM   TRIG 113 10/05/2020 11:22 AM   HDL 49 10/05/2020 11:22 AM   CHOLHDL 3.6 10/05/2020 11:22 AM   CHOLHDL 4 09/01/2015 10:47 AM   LDLCALC 109 (H) 10/05/2020 11:22 AM    Wt Readings from Last 3 Encounters:  01/04/21 (!) 368 lb (166.9 kg)  01/02/21 (!) 368 lb 6.2 oz (167.1 kg)  12/07/20 (!) 368 lb 6.4 oz (167.1 kg)     Exam:    Vital Signs:  Ht 5' 3.6" (1.615 m)   Wt (!) 368 lb (166.9 kg)   LMP  (LMP Unknown)   BMI 63.96 kg/m     Physical Exam  ASSESSMENT & PLAN:    1. Uncontrolled type 2 diabetes mellitus with hyperglycemia ([place]) -Patient was taking 0.50 of  ozempic but started to have symptoms of N/V. She wants to go back to 0.25 weekly and I have advised her if she continues to have issues and SE, we will have to discontinue. Patient agrees to the plan.   2. Vitamin D deficiency - Vitamin D (25 hydroxy) - Vitamin D, Ergocalciferol, (DRISDOL) 1.25 MG (50000 UNIT) CAPS capsule; Take 1 capsule (50,000 Units total) by mouth every 7 (seven) days.  Dispense: 12 capsule; Refill: 0  3. Gastroesophageal reflux disease without esophagitis - famotidine (PEPCID) 20 MG tablet; Take 1 tablet (20 mg total) by mouth 2 (two) times daily.  Dispense: 60 tablet; Refill: 1  Follow up: if symptoms persist or do not get better.   The patient was encouraged to call or send a message through [place] for any questions or concerns.   Side effects and appropriate use of all the medication(s) were discussed with the patient today. Patient advised to use the medication(s) as directed by their healthcare provider. The patient was encouraged to read, review, and understand all associated package inserts and contact our office with any questions or concerns. The patient accepts the risks of the treatment plan and had an opportunity to ask questions.   Staying healthy and adopting a healthy lifestyle for your overall health is important. You should eat 7 or more servings of fruits and vegetables per day. You should drink Wright of water to keep yourself hydrated and your kidneys healthy. This includes about 65-80+ fluid ounces of water. Limit your intake of animal fats especially for elevated cholesterol. Avoid highly processed food and limit your salt intake if you have hypertension. Avoid foods high in saturated/Trans fats. Along with a healthy diet it is also very important to maintain time for yourself to maintain a healthy mental health with low stress levels. You should get atleast 150 min of moderate intensity exercise weekly for a healthy heart. Along with eating right and  exercising, aim for at least 7-9 hours of sleep daily.  Eat more whole grains which includes barley, wheat berries, oats, brown rice and whole wheat pasta. Use healthy plant oils which include olive, soy, corn, sunflower and peanut. Limit your caffeine and sugary drinks. Limit your intake of fast  foods. Limit milk and dairy products to one or two daily servings.   "I discussed the limitations of evaluation and management by telemedicine and the availability of in person appointments. The patient expressed understanding and agreed to proceed"      Patient was given opportunity to ask questions. Patient verbalized understanding of the plan and was able to repeat key elements of the plan. All questions were answered to their satisfaction.  Raman Andrya Roppolo, DNP   I, Raman Aminat [place] have reviewed all documentation for this visit. The documentation on 01/04/21 for the exam, diagnosis, procedures, and orders are all accurate and complete.   COVID-19 Education: The signs and symptoms of COVID-19 were discussed with the patient and how to seek care for testing (follow up with PCP or arrange E-visit).  The importance of social distancing was discussed today.  Patient Risk:   After full review of this patients clinical status, I feel that they are at least moderate risk at this time.  Time:   Today, I have spent 15 minutes/ seconds with the patient with telehealth technology discussing above diagnoses.     Medication Adjustments/Labs and Tests Ordered: Current medicines are reviewed at length with the patient today.  Concerns regarding medicines are outlined above.   Tests Ordered: No orders of the defined types were placed in this encounter.   Medication Changes: No orders of the defined types were placed in this encounter.   Disposition:  Follow up prn  Signed, Bary Castilla, NP

## 2021-01-17 DIAGNOSIS — Z79891 Long term (current) use of opiate analgesic: Secondary | ICD-10-CM | POA: Diagnosis not present

## 2021-01-17 DIAGNOSIS — R52 Pain, unspecified: Secondary | ICD-10-CM | POA: Diagnosis not present

## 2021-01-17 DIAGNOSIS — G894 Chronic pain syndrome: Secondary | ICD-10-CM | POA: Diagnosis not present

## 2021-01-17 DIAGNOSIS — R519 Headache, unspecified: Secondary | ICD-10-CM | POA: Diagnosis not present

## 2021-01-17 DIAGNOSIS — Z5181 Encounter for therapeutic drug level monitoring: Secondary | ICD-10-CM | POA: Diagnosis not present

## 2021-02-03 ENCOUNTER — Telehealth: Payer: Self-pay | Admitting: Genetic Counselor

## 2021-02-03 NOTE — Telephone Encounter (Signed)
Scheduled appt per 9/9 referral. Pt had called in requesting to sch appt from past referral. Scheduled appt, pt is aware. She also said she is active in [place] and will get the appt details from there.

## 2021-02-06 ENCOUNTER — Encounter: Payer: Medicare Other | Admitting: Internal Medicine

## 2021-02-07 ENCOUNTER — Ambulatory Visit (INDEPENDENT_AMBULATORY_CARE_PROVIDER_SITE_OTHER): Payer: Medicare Other | Admitting: Internal Medicine

## 2021-02-07 ENCOUNTER — Other Ambulatory Visit: Payer: Self-pay

## 2021-02-07 ENCOUNTER — Encounter: Payer: Self-pay | Admitting: Internal Medicine

## 2021-02-07 VITALS — BP 122/70 | HR 80 | Temp 98.1°F | Ht 63.6 in | Wt 374.6 lb

## 2021-02-07 DIAGNOSIS — G4459 Other complicated headache syndrome: Secondary | ICD-10-CM

## 2021-02-07 DIAGNOSIS — I10 Essential (primary) hypertension: Secondary | ICD-10-CM | POA: Diagnosis not present

## 2021-02-07 DIAGNOSIS — E039 Hypothyroidism, unspecified: Secondary | ICD-10-CM | POA: Diagnosis not present

## 2021-02-07 DIAGNOSIS — Z2821 Immunization not carried out because of patient refusal: Secondary | ICD-10-CM

## 2021-02-07 DIAGNOSIS — E538 Deficiency of other specified B group vitamins: Secondary | ICD-10-CM | POA: Diagnosis not present

## 2021-02-07 DIAGNOSIS — E7800 Pure hypercholesterolemia, unspecified: Secondary | ICD-10-CM

## 2021-02-07 DIAGNOSIS — E1165 Type 2 diabetes mellitus with hyperglycemia: Secondary | ICD-10-CM

## 2021-02-07 DIAGNOSIS — Z6841 Body Mass Index (BMI) 40.0 and over, adult: Secondary | ICD-10-CM

## 2021-02-07 DIAGNOSIS — Z0000 Encounter for general adult medical examination without abnormal findings: Secondary | ICD-10-CM

## 2021-02-07 LAB — POCT URINALYSIS DIPSTICK
Bilirubin, UA: NEGATIVE
Blood, UA: NEGATIVE
Glucose, UA: NEGATIVE
Ketones, UA: NEGATIVE
Leukocytes, UA: NEGATIVE
Nitrite, UA: NEGATIVE
Protein, UA: NEGATIVE
Spec Grav, UA: 1.01 (ref 1.010–1.025)
Urobilinogen, UA: 0.2 E.U./dL
pH, UA: 6.5 (ref 5.0–8.0)

## 2021-02-07 LAB — POCT UA - MICROALBUMIN
Albumin/Creatinine Ratio, Urine, POC: <30
Creatinine, POC: 200 mg/dL
Microalbumin Ur, POC: 10 mg/L

## 2021-02-07 MED ORDER — OZEMPIC (0.25 OR 0.5 MG/DOSE) 2 MG/1.5ML ~~LOC~~ SOPN: 0.2500 mg | PEN_INJECTOR | SUBCUTANEOUS | 3 refills | Status: DC

## 2021-02-07 MED ORDER — CYANOCOBALAMIN 1000 MCG/ML IJ SOLN
1000.0000 ug | Freq: Once | INTRAMUSCULAR | Status: AC
  Administered 2021-02-07: 1000 ug via INTRAMUSCULAR

## 2021-02-07 MED ORDER — LOSARTAN POTASSIUM 25 MG PO TABS: 25.0000 mg | ORAL_TABLET | Freq: Every day | ORAL | 3 refills | Status: DC

## 2021-02-07 NOTE — Progress Notes (Signed)
This visit occurred during the SARS-CoV-2 public health emergency.  Safety protocols were in place, including screening questions prior to the visit, additional usage of staff PPE, and extensive cleaning of exam room while observing appropriate contact time as indicated for disinfecting solutions.  Subjective:     Patient ID: [name] Wright , female    DOB: [birth year] , 55 y.o.   MRN: [id number]   Chief Complaint  Patient presents with   Annual Exam   Diabetes   Hypertension    HPI  Patient is here for full physical exam. She states that she is compliant with medications. She has no concerns at this time. Patient declines both COVID and influenza vaccines. She is seen by Dr Gertie Fey at [facility] at [facility] for her GYN care.   Diabetes She presents for her follow-up diabetic visit. She has type 2 diabetes mellitus. There are no hypoglycemic associated symptoms. Pertinent negatives for diabetes include no blurred vision and no chest pain. Pertinent negatives for diabetic complications include no CVA, heart disease, peripheral neuropathy or PVD. Risk factors for coronary artery disease include diabetes mellitus, sedentary lifestyle and obesity.  Hypertension The problem is controlled. Pertinent negatives include no blurred vision or chest pain. Risk factors for coronary artery disease include diabetes mellitus, dyslipidemia, obesity and sedentary lifestyle. The current treatment provides moderate improvement. Compliance problems include exercise.  There is no history of CVA or PVD.    Past Medical History:  Diagnosis Date   Acute cystitis    Allergic rhinitis    Childhood asthma    no problems as adult - no inhaler   Chronic insomnia    Chronic pain due to trauma 02/2001   closed head trauma - Followed by Dr Weyman [facility]   Complication of anesthesia    Dyspepsia    Family history of ovarian cancer    Head trauma 02/24/2001   closed    History of kidney stones    passed stone - no surgery required   Hypothyroidism    PONV (postoperative nausea and vomiting)    Pre-diabetes    SVD (spontaneous vaginal delivery)    fetal demise at 60 months     Family History  Problem Relation Age of Onset   Multiple sclerosis Mother 9   Bladder Cancer Mother 23   Heart Problems Father    Dementia Paternal Aunt    Ovarian cancer Maternal Grandmother    Lung cancer Maternal Grandfather    Tuberculosis Paternal Grandmother      Current Outpatient Medications:    albuterol (PROAIR HFA) 108 (90 Base) MCG/ACT inhaler, Inhale 2 puffs into the lungs every 4 (four) hours as needed for wheezing or shortness of breath., Disp: 1 Inhaler, Rfl: 1   baclofen (LIORESAL) 10 MG tablet, Take by mouth., Disp: , Rfl:    butalbital-acetaminophen-caffeine (FIORICET) 50-325-40 MG tablet, Take 1 tablet by mouth every 4 (four) hours as needed., Disp: , Rfl:    diphenhydrAMINE (BENADRYL) 25 mg capsule, Take 25 mg by mouth every 6 (six) hours as needed for allergies. , Disp: , Rfl:    famotidine (PEPCID) 20 MG tablet, Take 1 tablet (20 mg total) by mouth 2 (two) times daily., Disp: 60 tablet, Rfl: 1   fluticasone (FLONASE) 50 MCG/ACT nasal spray, Place 1 spray into both nostrils in the morning., Disp: , Rfl:    levothyroxine (SYNTHROID) 100 MCG tablet, TAKE 1 TABLET BY MOUTH BEFORE BREAKFAST, Disp: 90 tablet, Rfl: 0  meclizine (ANTIVERT) 25 MG tablet, Take 12.5-25 mg by mouth 2 (two) times daily as needed for dizziness (vertigo)., Disp: , Rfl:    methocarbamol (ROBAXIN) 750 MG tablet, Take 500 mg by mouth every 6 (six) hours as needed for muscle spasms. 1 at bedtime, Disp: , Rfl:    morphine (MSIR) 15 MG tablet, Take 15 mg by mouth 4 (four) times daily as needed for severe pain. , Disp: , Rfl:    Multiple Vitamins-Minerals (MAXIMUM DAILY GREEN PO), Take 1 Package by mouth 3 (three) times a week. It [employer]! Greens, Disp: , Rfl:    promethazine (PHENERGAN) 25 MG  tablet, TAKE 1/2 TO 1 (ONE-HALF TO ONE) TABLET BY MOUTH EVERY 8 HOURS AS NEEDED FOR NAUSEA, Disp: 40 tablet, Rfl: 0   RESTASIS 0.05 % ophthalmic emulsion, Place 1 drop into both eyes 2 (two) times daily., Disp: , Rfl:    SUDOGEST MAXIMUM STRENGTH 30 MG tablet, TAKE 1 TABLET BY MOUTH TWICE DAILY AS NEEDED FOR CONGESTION, Disp: 90 tablet, Rfl: 0   traMADol (ULTRAM) 50 MG tablet, TAKE 1 TABLET BY MOUTH EVERY 6 HOURS AS NEEDED, Disp: 30 tablet, Rfl: 0   Vitamin D, Ergocalciferol, (DRISDOL) 1.25 MG (50000 UNIT) CAPS capsule, Take 1 capsule (50,000 Units total) by mouth every 7 (seven) days., Disp: 12 capsule, Rfl: 0   losartan (COZAAR) 25 MG tablet, Take 1 tablet (25 mg total) by mouth daily. 1/2 tab daily, Disp: 45 tablet, Rfl: 3   Semaglutide,0.25 or 0.5MG /DOS, (OZEMPIC, 0.25 OR 0.5 MG/DOSE,) 2 MG/1.5ML SOPN, Inject 0.25 mg into the skin once a week., Disp: 1.5 mL, Rfl: 3   Allergies  Allergen Reactions   Doxycycline Anaphylaxis, Diarrhea and Nausea And Vomiting    headache   Oxycodone-Acetaminophen Hives and Nausea And Vomiting   Prochlorperazine Edisylate Other (See Comments)    Hyper and jitteriness   Amoxicillin Rash and Other (See Comments)    Has patient had a PCN reaction causing immediate rash, facial/tongue/throat swelling, SOB or lightheadedness with hypotension: Unknown Has patient had a PCN reaction causing severe rash involving mucus membranes or skin necrosis: Unknown Has patient had a PCN reaction that required hospitalization: No Has patient had a PCN reaction occurring within the last 10 years: Yes If all of the above answers are "NO", then may proceed with Cephalosporin use.    Compazine Other (See Comments)    Hyper/jitters/blotches   Eszopiclone Other (See Comments)    Metallic taste and became un effective    Keppra [Levetiracetam] Other (See Comments)    hyperactive   Latex    Levetiracetam Other (See Comments)    Jitters/hyper   Neurontin [Gabapentin] Nausea And  Vomiting   Propoxyphene Hives   Tape     PAPER TAPE-skin burns/severe irritation  PATIENT DOES NOT TOLERATE PAPER TAPE   Trileptal [Oxcarbazepine] Other (See Comments)    REACTION: immune system suppressed   Bactrim [Sulfamethoxazole-Trimethoprim] Rash   Bupropion Rash   Codeine Rash   Hydrocodone-Acetaminophen Rash   Moxifloxacin Swelling and Rash   Propoxyphene N-Acetaminophen Nausea And Vomiting and Rash   Vicodin [Hydrocodone-Acetaminophen] Rash      The patient states she uses none for birth control. Last LMP was No LMP recorded.. Negative for Dysmenorrhea. Negative for: breast discharge, breast lump(s), breast pain and breast self exam. Associated symptoms include abnormal vaginal bleeding. Pertinent negatives include abnormal bleeding (hematology), anxiety, decreased libido, depression, difficulty falling sleep, dyspareunia, history of infertility, nocturia, sexual dysfunction, sleep disturbances, urinary incontinence, urinary urgency,  vaginal discharge and vaginal itching. Diet regular.The patient states her exercise level is  minimal.   . The patient's tobacco use is:  Social History   Tobacco Use  Smoking Status Never  Smokeless Tobacco Never  . She has been exposed to passive smoke. The patient's alcohol use is:  Social History   Substance and Sexual Activity  Alcohol Use No   Comment: "1 drink/year"   Review of Systems  Constitutional: Negative.   HENT: Negative.    Eyes: Negative.  Negative for blurred vision.  Respiratory: Negative.    Cardiovascular: Negative.  Negative for chest pain.  Endocrine: Negative.   Genitourinary: Negative.   Musculoskeletal: Negative.   Skin: Negative.   Allergic/Immunologic: Negative.   Neurological: Negative.   Hematological: Negative.   Psychiatric/Behavioral: Negative.      Today's Vitals   02/07/21 1108  BP: 122/70  Pulse: 80  Temp: 98.1 F (36.7 C)  Weight: (!) 374 lb 9.6 oz (169.9 kg)  Height: 5' 3.6" (1.615 m)    Body mass index is 65.11 kg/m.   Wt Readings from Last 3 Encounters:  02/07/21 (!) 374 lb 9.6 oz (169.9 kg)  01/04/21 (!) 368 lb (166.9 kg)  01/02/21 (!) 368 lb 6.2 oz (167.1 kg)   BP Readings from Last 3 Encounters:  02/07/21 122/70  01/02/21 (!) 142/80  12/07/20 134/80     Objective:  Physical Exam Vitals and nursing note reviewed.  Constitutional:      Appearance: Normal appearance. She is obese.  HENT:     Head: Normocephalic and atraumatic.     Right Ear: Tympanic membrane, ear canal and external ear normal.     Left Ear: Tympanic membrane, ear canal and external ear normal.     Nose:     Comments: MASKED     Mouth/Throat:     Comments: MASKED  Eyes:     Extraocular Movements: Extraocular movements intact.     Conjunctiva/sclera: Conjunctivae normal.     Pupils: Pupils are equal, round, and reactive to light.  Cardiovascular:     Rate and Rhythm: Normal rate and regular rhythm.     Pulses: Normal pulses.          Dorsalis pedis pulses are 2+ on the right side and 2+ on the left side.     Heart sounds: Normal heart sounds.  Pulmonary:     Effort: Pulmonary effort is normal.     Breath sounds: Normal breath sounds.  Chest:  Breasts:    Tanner Score is 5.     Right: Normal.     Left: Normal.  Abdominal:     General: Bowel sounds are normal.     Palpations: Abdomen is soft.     Comments: Obese, soft. Difficult to assess organomegaly due to body habitus   Genitourinary:    Comments: deferred Musculoskeletal:        General: Normal range of motion.     Cervical back: Normal range of motion and neck supple.  Feet:     Right foot:     Protective Sensation: 5 sites tested.  5 sites sensed.     Skin integrity: Dry skin present.     Toenail Condition: Right toenails are normal.     Left foot:     Protective Sensation: 5 sites tested.  5 sites sensed.     Skin integrity: Dry skin present.     Toenail Condition: Left toenails are normal.  Skin:    General:  Skin is warm and dry.  Neurological:     General: No focal deficit present.     Mental Status: She is alert and oriented to person, place, and time.  Psychiatric:        Mood and Affect: Mood normal.        Behavior: Behavior normal.        Assessment And Plan:     1. Encounter for annual physical exam Comments: A full exam was performed. Importance of monthly self breast exams was discussed with the patient.  PATIENT IS ADVISED TO GET 30-45 MINUTES REGULAR EXERCISE NO LESS THAN FOUR TO FIVE DAYS PER WEEK - BOTH WEIGHTBEARING EXERCISES AND AEROBIC ARE RECOMMENDED.  PATIENT IS ADVISED TO FOLLOW A HEALTHY DIET WITH AT LEAST SIX FRUITS/VEGGIES PER DAY, DECREASE INTAKE OF RED MEAT, AND TO INCREASE FISH INTAKE TO TWO DAYS PER WEEK.  MEATS/FISH SHOULD NOT BE FRIED, BAKED OR BROILED IS PREFERABLE.  IT IS ALSO IMPORTANT TO CUT BACK ON YOUR SUGAR INTAKE. PLEASE AVOID ANYTHING WITH ADDED SUGAR, CORN SYRUP OR OTHER SWEETENERS. IF YOU MUST USE A SWEETENER, YOU CAN TRY STEVIA. IT IS ALSO IMPORTANT TO AVOID ARTIFICIALLY SWEETENERS AND DIET BEVERAGES. LASTLY, I SUGGEST WEARING SPF 50 SUNSCREEN ON EXPOSED PARTS AND ESPECIALLY WHEN IN THE DIRECT SUNLIGHT FOR AN EXTENDED PERIOD OF TIME.  PLEASE AVOID FAST FOOD RESTAURANTS AND INCREASE YOUR WATER INTAKE.   2. Uncontrolled type 2 diabetes mellitus with hyperglycemia ([place]) Comments: Diabetic foot exam was performed. I DISCUSSED WITH THE PATIENT AT LENGTH REGARDING THE GOALS OF GLYCEMIC CONTROL AND POSSIBLE LONG-TERM COMPLICATIONS.  I  ALSO STRESSED THE IMPORTANCE OF COMPLIANCE WITH HOME GLUCOSE MONITORING, DIETARY RESTRICTIONS INCLUDING AVOIDANCE OF SUGARY DRINKS/PROCESSED FOODS,  ALONG WITH REGULAR EXERCISE.  I  ALSO STRESSED THE IMPORTANCE OF ANNUAL EYE EXAMS, SELF FOOT CARE AND COMPLIANCE WITH OFFICE VISITS.  - POCT Urinalysis Dipstick (81002) - POCT UA - Microalbumin - CBC - CMP14+EGFR - Hemoglobin A1c  3. Essential hypertension, benign Comments: Chronic,  she is on ARB therapy - losartan w/o any issues. EKG not performed, last one performed June 2022. Encouraged to follow low sodium diet.  4. Other complicated headache syndrome Comments: She has h/o TBI. She is followed by [facility]   5. Pure hypercholesterolemia Comments: She does not wish to start statin therapy at this time.  She is encouraged to live a heart healthy lifestyle. - TSH  6. Primary hypothyroidism Comments: I will check TSH and adjust meds as needed.  - TSH  7. B12 deficiency Comments: I will check vitamin B12 level today. She was also given vitamin B12 IM x 1.  - Vitamin B12 - cyanocobalamin ((VITAMIN B-12)) injection 1,000 mcg  8. Class 3 severe obesity due to excess calories with serious comorbidity and body mass index (BMI) of 60.0 to 69.9 in adult [facility]) Comments: She has gained 6 lbs since her last visit. She is encouraged to gradually increase her daily activity, aiming for at least 150 minutes of exercise/week.   9. Influenza vaccination declined  10. COVID-19 vaccine dose declined  Patient was given opportunity to ask questions. Patient verbalized understanding of the plan and was able to repeat key elements of the plan. All questions were answered to their satisfaction.   I, Maximino Greenland, MD, have reviewed all documentation for this visit. The documentation on 02/07/21 for the exam, diagnosis, procedures, and orders are all accurate and complete.   THE PATIENT IS ENCOURAGED TO PRACTICE SOCIAL DISTANCING  DUE TO THE COVID-19 PANDEMIC.

## 2021-02-07 NOTE — Patient Instructions (Addendum)
Voltaren gel - apply to affected area two to three times daily as needed Magnesium glycinate, use as directed nightly  Health Maintenance, Female Adopting a healthy lifestyle and getting preventive care are important in promoting health and wellness. Ask your health care provider about: The right schedule for you to have regular tests and exams. Things you can do on your own to prevent diseases and keep yourself healthy. What should I know about diet, weight, and exercise? Eat a healthy diet  Eat a diet that includes plenty of vegetables, fruits, low-fat dairy products, and lean protein. Do not eat a lot of foods that are high in solid fats, added sugars, or sodium. Maintain a healthy weight Body mass index (BMI) is used to identify weight problems. It estimates body fat based on height and weight. Your health care provider can help determine your BMI and help you achieve or maintain a healthy weight. Get regular exercise Get regular exercise. This is one of the most important things you can do for your health. Most adults should: Exercise for at least 150 minutes each week. The exercise should increase your heart rate and make you sweat (moderate-intensity exercise). Do strengthening exercises at least twice a week. This is in addition to the moderate-intensity exercise. Spend less time sitting. Even light physical activity can be beneficial. Watch cholesterol and blood lipids Have your blood tested for lipids and cholesterol at 54 years of age, then have this test every 5 years. Have your cholesterol levels checked more often if: Your lipid or cholesterol levels are high. You are older than 54 years of age. You are at high risk for heart disease. What should I know about cancer screening? Depending on your health history and family history, you may need to have cancer screening at various ages. This may include screening for: Breast cancer. Cervical cancer. Colorectal cancer. Skin  cancer. Lung cancer. What should I know about heart disease, diabetes, and high blood pressure? Blood pressure and heart disease High blood pressure causes heart disease and increases the risk of stroke. This is more likely to develop in people who have high blood pressure readings, are of African descent, or are overweight. Have your blood pressure checked: Every 3-5 years if you are 24-53 years of age. Every year if you are 64 years old or older. Diabetes Have regular diabetes screenings. This checks your fasting blood sugar level. Have the screening done: Once every three years after age 46 if you are at a normal weight and have a low risk for diabetes. More often and at a younger age if you are overweight or have a high risk for diabetes. What should I know about preventing infection? Hepatitis B If you have a higher risk for hepatitis B, you should be screened for this virus. Talk with your health care provider to find out if you are at risk for hepatitis B infection. Hepatitis C Testing is recommended for: Everyone born from [birth year] through 1964. Anyone with known risk factors for hepatitis C. Sexually transmitted infections (STIs) Get screened for STIs, including gonorrhea and chlamydia, if: You are sexually active and are younger than 54 years of age. You are older than 54 years of age and your health care provider tells you that you are at risk for this type of infection. Your sexual activity has changed since you were last screened, and you are at increased risk for chlamydia or gonorrhea. Ask your health care provider if you are at risk. Ask your  health care provider about whether you are at high risk for HIV. Your health care provider may recommend a prescription medicine to help prevent HIV infection. If you choose to take medicine to prevent HIV, you should first get tested for HIV. You should then be tested every 3 months for as long as you are taking the medicine. Pregnancy If  you are about to stop having your period (premenopausal) and you may become pregnant, seek counseling before you get pregnant. Take 400 to 800 micrograms (mcg) of folic acid every day if you become pregnant. Ask for birth control (contraception) if you want to prevent pregnancy. Osteoporosis and menopause Osteoporosis is a disease in which the bones lose minerals and strength with aging. This can result in bone fractures. If you are 49 years old or older, or if you are at risk for osteoporosis and fractures, ask your health care provider if you should: Be screened for bone loss. Take a calcium or vitamin D supplement to lower your risk of fractures. Be given hormone replacement therapy (HRT) to treat symptoms of menopause. Follow these instructions at home: Lifestyle Do not use any products that contain nicotine or tobacco, such as cigarettes, e-cigarettes, and chewing tobacco. If you need help quitting, ask your health care provider. Do not use street drugs. Do not share needles. Ask your health care provider for help if you need support or information about quitting drugs. Alcohol use Do not drink alcohol if: Your health care provider tells you not to drink. You are pregnant, may be pregnant, or are planning to become pregnant. If you drink alcohol: Limit how much you use to 0-1 drink a day. Limit intake if you are breastfeeding. Be aware of how much alcohol is in your drink. In the [place] one drink equals one 12 oz bottle of beer (355 mL), one 5 oz glass of wine (148 mL), or one 1 oz glass of hard liquor (44 mL). General instructions Schedule regular health, dental, and eye exams. Stay current with your vaccines. Tell your health care provider if: You often feel depressed. You have ever been abused or do not feel safe at home. Summary Adopting a healthy lifestyle and getting preventive care are important in promoting health and wellness. Follow your health care provider's  instructions about healthy diet, exercising, and getting tested or screened for diseases. Follow your health care provider's instructions on monitoring your cholesterol and blood pressure. This information is not intended to replace advice given to you by your health care provider. Make sure you discuss any questions you have with your health care provider. Document Revised: 06/03/2020 Document Reviewed: 03/19/2018 Elsevier Patient Education  2022 [employer].

## 2021-02-08 LAB — HEMOGLOBIN A1C
Est. average glucose Bld gHb Est-mCnc: 146 mg/dL
Hgb A1c MFr Bld: 6.7 % — ABNORMAL HIGH (ref 4.8–5.6)

## 2021-02-08 LAB — CMP14+EGFR
ALT: 26 IU/L (ref 0–32)
AST: 29 IU/L (ref 0–40)
Albumin/Globulin Ratio: 1.1 — ABNORMAL LOW (ref 1.2–2.2)
Albumin: 4 g/dL (ref 3.8–4.9)
Alkaline Phosphatase: 118 IU/L (ref 44–121)
BUN/Creatinine Ratio: 16 (ref 9–23)
BUN: 11 mg/dL (ref 6–24)
Bilirubin Total: 0.5 mg/dL (ref 0.0–1.2)
CO2: 24 mmol/L (ref 20–29)
Calcium: 9.3 mg/dL (ref 8.7–10.2)
Chloride: 98 mmol/L (ref 96–106)
Creatinine, Ser: 0.7 mg/dL (ref 0.57–1.00)
Globulin, Total: 3.5 g/dL (ref 1.5–4.5)
Glucose: 156 mg/dL — ABNORMAL HIGH (ref 70–99)
Potassium: 4.3 mmol/L (ref 3.5–5.2)
Sodium: 137 mmol/L (ref 134–144)
Total Protein: 7.5 g/dL (ref 6.0–8.5)
eGFR: 103 mL/min/{1.73_m2} (ref >59)

## 2021-02-08 LAB — TSH: TSH: 1.19 u[IU]/mL (ref 0.450–4.500)

## 2021-02-08 LAB — CBC
Hematocrit: 41.8 % (ref 34.0–46.6)
Hemoglobin: 14.3 g/dL (ref 11.1–15.9)
MCH: 29.7 pg (ref 26.6–33.0)
MCHC: 34.2 g/dL (ref 31.5–35.7)
MCV: 87 fL (ref 79–97)
Platelets: 312 10*3/uL (ref 150–450)
RBC: 4.82 x10E6/uL (ref 3.77–5.28)
RDW: 12.6 % (ref 11.7–15.4)
WBC: 7.9 10*3/uL (ref 3.4–10.8)

## 2021-02-08 LAB — VITAMIN B12: Vitamin B-12: 590 pg/mL (ref 232–1245)

## 2021-02-14 ENCOUNTER — Other Ambulatory Visit: Payer: Self-pay | Admitting: Genetic Counselor

## 2021-02-14 DIAGNOSIS — D122 Benign neoplasm of ascending colon: Secondary | ICD-10-CM

## 2021-02-15 ENCOUNTER — Other Ambulatory Visit: Payer: Self-pay

## 2021-02-15 ENCOUNTER — Inpatient Hospital Stay: Payer: Medicare Other | Attending: Genetic Counselor | Admitting: Genetic Counselor

## 2021-02-15 ENCOUNTER — Inpatient Hospital Stay: Payer: Medicare Other

## 2021-02-15 DIAGNOSIS — D122 Benign neoplasm of ascending colon: Secondary | ICD-10-CM

## 2021-02-15 DIAGNOSIS — D124 Benign neoplasm of descending colon: Secondary | ICD-10-CM

## 2021-02-15 DIAGNOSIS — D123 Benign neoplasm of transverse colon: Secondary | ICD-10-CM

## 2021-02-15 DIAGNOSIS — Z86010 Personal history of colonic polyps: Secondary | ICD-10-CM | POA: Diagnosis not present

## 2021-02-15 DIAGNOSIS — D125 Benign neoplasm of sigmoid colon: Secondary | ICD-10-CM | POA: Diagnosis not present

## 2021-02-15 DIAGNOSIS — Z8041 Family history of malignant neoplasm of ovary: Secondary | ICD-10-CM | POA: Diagnosis not present

## 2021-02-15 LAB — GENETIC SCREENING ORDER

## 2021-02-16 ENCOUNTER — Encounter: Payer: Self-pay | Admitting: Genetic Counselor

## 2021-02-16 DIAGNOSIS — Z8041 Family history of malignant neoplasm of ovary: Secondary | ICD-10-CM | POA: Insufficient documentation

## 2021-02-16 NOTE — Progress Notes (Signed)
REFERRING PROVIDER: [name], [address] [place],  [place] [address]  PRIMARY PROVIDER:  [name], MD  PRIMARY REASON FOR VISIT:  1. Family history of ovarian cancer   2. Adenomatous polyp of ascending colon   3. Adenomatous polyp of descending colon   4. Adenomatous polyp of sigmoid colon   5. Adenomatous polyp of transverse colon      HISTORY OF PRESENT ILLNESS:   [name] Wright, a 55 y.o. female, was seen for a [facility] cancer genetics consultation at the request of Dr. Baird Cancer due to a personal history of polyposis and family history of ovarian cancer.  [name] Wright presents to clinic today to discuss the possibility of a hereditary predisposition to cancer, genetic testing, and to further clarify her future cancer risks, as well as potential cancer risks for family members.   In May 2022, at the age of 60, [name] Wright was diagnosed with polyposis of the colon. The treatment plan included removing the polyps and sending to genetics.     CANCER HISTORY:  Oncology History   No history exists.     RISK FACTORS:  Menarche was at age 43.  First live birth at age N/A.  OCP use for approximately 11 years.  Ovaries intact: yes.  Hysterectomy: no.  Menopausal status: premenopausal.  HRT use: 0 years. Colonoscopy: yes; abnormal. Mammogram within the last year: yes. Number of breast biopsies: 0. Up to date with pelvic exams: yes. Any excessive radiation exposure in the past: no  Past Medical History:  Diagnosis Date   Acute cystitis    Allergic rhinitis    Childhood asthma    no problems as adult - no inhaler   Chronic insomnia    Chronic pain due to trauma 02/2001   closed head trauma - Followed by Dr [facility]   Complication of anesthesia    Dyspepsia    Family history of ovarian cancer    Head trauma 02/24/2001   closed   History of kidney stones    passed stone - no surgery required   Hypothyroidism     PONV (postoperative nausea and vomiting)    Pre-diabetes    SVD (spontaneous vaginal delivery)    fetal demise at 5 months    Past Surgical History:  Procedure Laterality Date   APPENDECTOMY     BIOPSY  08/25/2020   Procedure: BIOPSY;  Surgeon: Lavena Bullion, DO;  Location: [facility];  Service: Gastroenterology;;   COLONOSCOPY WITH PROPOFOL N/A 08/25/2020   Procedure: COLONOSCOPY WITH PROPOFOL;  Surgeon: Lavena Bullion, DO;  Location: [facility];  Service: Gastroenterology;  Laterality: N/A;   DILATATION & CURETTAGE/HYSTEROSCOPY WITH MYOSURE N/A 04/15/2017   Procedure: DILATATION & CURETTAGE/HYSTEROSCOPY WITH MYOSURE POLYPECTOMY;  Surgeon: Everlene Farrier, MD;  Location: [facility];  Service: Gynecology;  Laterality: N/A;   DILATATION & CURETTAGE/HYSTEROSCOPY WITH MYOSURE N/A 06/18/2019   Procedure: DILATATION & CURETTAGE/HYSTEROSCOPY WITH  MYOSURE;  Surgeon: Everlene Farrier, MD;  Location: [facility];  Service: Gynecology;  Laterality: N/A;   ESOPHAGOGASTRODUODENOSCOPY (EGD) WITH PROPOFOL N/A 08/25/2020   Procedure: ESOPHAGOGASTRODUODENOSCOPY (EGD) WITH PROPOFOL;  Surgeon: Lavena Bullion, DO;  Location: [facility];  Service: Gastroenterology;  Laterality: N/A;   EYE SURGERY     cataracts removed   INCONTINENCE SURGERY     PILONIDAL CYST EXCISION     x2   POLYPECTOMY  08/25/2020   Procedure: POLYPECTOMY;  Surgeon: Lavena Bullion, DO;  Location: [facility];  Service: Gastroenterology;;   repair of toe laceration     dermabond to right big toe right foot   repair of torn ligament right leg     patient denies this surgery   right foot fracture     cast only   TONSILLECTOMY     WISDOM TOOTH EXTRACTION      Social History   Socioeconomic History   Marital status: Married    Spouse name: Not on file   Number of children: 0   Years of education: Not on file   Highest education level: Not on file  Occupational History   Occupation: disability  Tobacco Use   Smoking  status: Never   Smokeless tobacco: Never  Vaping Use   Vaping Use: Never used  Substance and Sexual Activity   Alcohol use: No    Comment: "1 drink/year"   Drug use: Yes    Types: Morphine    Comment: Morphine 15 mg tab 4xdaily prn   Sexual activity: Not Currently    Birth control/protection: None  Other Topics Concern   Not on file  Social History Narrative   [employer], BS, 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   Married- '94- 8 years divorced, Married '03   No children   Retired- disability from closed head injury with focus, cognition   Social Determinants of Radio broadcast assistant Strain: Low Risk    Difficulty of Paying Living Expenses: Not hard at all  Food Insecurity: No Food Insecurity   Worried About Charity fundraiser in the Last Year: Never true   Arboriculturist in the Last Year: Never true  Transportation Needs: No Transportation Needs   Lack of Transportation (Medical): No   Lack of Transportation (Non-Medical): No  Physical Activity: Inactive   Days of Exercise per Week: 0 days   Minutes of Exercise per Session: 0 min  Stress: No Stress Concern Present   Feeling of Stress : Not at all  Social Connections: Not on file     FAMILY HISTORY:  We obtained a detailed, 4-generation family history.  Significant diagnoses are listed below: Family History  Problem Relation Age of Onset   Multiple sclerosis Mother 1   Bladder Cancer Mother 34   Heart Problems Father    Dementia Paternal Aunt    Ovarian cancer Maternal Grandmother    Lung cancer Maternal Grandfather    Tuberculosis Paternal Grandmother      The patient does not have children.  She has a brother who is cancer free.  Both parents are deceased.  The patient's father died of heart problems.  He had a sister who had dementia.  His parents are deceased.  His father died of old age at 65 and his mother died of TB.  They were first cousins.  The patient's mother had bladder cancer at 55.  She has a brother  who is cancer free.  Her parent are deceased.  Her mother had ovarian cancer.  [name] Wright is unaware of previous family history of genetic testing for hereditary cancer risks. Patient's maternal ancestors are of [place] descent, and paternal ancestors are of English descent. There is no reported Ashkenazi Jewish ancestry. There is no known consanguinity.  GENETIC COUNSELING ASSESSMENT: [name] Wright is a 55 y.o. female with a personal history of polyposis and family history of ovarian cancer which is somewhat suggestive of a hereditary cancer syndrome and predisposition to cancer given the number of colon polyps she has and  the family history of ovarian cancer. We, therefore, discussed and recommended the following at today's visit.   DISCUSSION: We discussed that, in general, most cancer is not inherited in families, but instead is sporadic or familial. Sporadic cancers occur by chance and typically happen at older ages (>50 years) as this type of cancer is caused by genetic changes acquired during an individual's lifetime. Some families have more cancers than would be expected by chance; however, the ages or types of cancer are not consistent with a known genetic mutation or known genetic mutations have been ruled out. This type of familial cancer is thought to be due to a combination of multiple genetic, environmental, hormonal, and lifestyle factors. While this combination of factors likely increases the risk of cancer, the exact source of this risk is not currently identifiable or testable.  We also discussed that many people have colon polyps, and most people will have fewer than 10.  When an individual has 10 or more colon polyps in their lifetime, it increases the likelihood of a hereditary syndrome causing the polyps, and an increased risk for colon cancer.  We also discussed that up to 20% of ovarian cancer is hereditary, with most cases associated with BRCA mutations.  There are other genes  that can be associated with hereditary ovarian cancer syndromes.  These include BRIP1, RAD51C, RAD51D and others.  We discussed that testing is beneficial for several reasons including knowing how to follow individuals after completing their treatment, identifying whether potential treatment options such as PARP inhibitors would be beneficial, and understand if other family members could be at risk for cancer and allow them to undergo genetic testing.   We reviewed the characteristics, features and inheritance patterns of hereditary cancer syndromes. We also discussed genetic testing, including the appropriate family members to test, the process of testing, insurance coverage and turn-around-time for results. We discussed the implications of a negative, positive, carrier and/or variant of uncertain significant result. We recommended [name] Wright pursue genetic testing for the CancerNext-Expanded+RNAinsight gene panel.   The CancerNext-Expanded gene panel offered by [facility] and includes sequencing and rearrangement analysis for the following 77 genes: AIP, ALK, APC*, ATM*, AXIN2, BAP1, BARD1, BLM, BMPR1A, BRCA1*, BRCA2*, BRIP1*, CDC73, CDH1*, CDK4, CDKN1B, CDKN2A, CHEK2*, CTNNA1, DICER1, FANCC, FH, FLCN, GALNT12, KIF1B, LZTR1, MAX, MEN1, MET, MLH1*, MSH2*, MSH3, MSH6*, MUTYH*, NBN, NF1*, NF2, NTHL1, PALB2*, PHOX2B, PMS2*, POT1, PRKAR1A, PTCH1, PTEN*, RAD51C*, RAD51D*, RB1, RECQL, RET, SDHA, SDHAF2, SDHB, SDHC, SDHD, SMAD4, SMARCA4, SMARCB1, SMARCE1, STK11, SUFU, TMEM127, TP53*, TSC1, TSC2, VHL and XRCC2 (sequencing and deletion/duplication); EGFR, EGLN1, HOXB13, KIT, MITF, PDGFRA, POLD1, and POLE (sequencing only); EPCAM and GREM1 (deletion/duplication only). DNA and RNA analyses performed for * genes.   Based on [name] Wright's personal history of polyposis and family history of cancer, she meets medical criteria for genetic testing. Despite that she meets criteria, she may still have an out of pocket  cost. We discussed that if her out of pocket cost for testing is over $100, the laboratory will call and confirm whether she wants to proceed with testing.  If the out of pocket cost of testing is less than $100 she will be billed by the genetic testing laboratory.   PLAN: After considering the risks, benefits, and limitations, [name] Wright provided informed consent to pursue genetic testing and the blood sample was sent to [facility] for analysis of the CancerNext-Expanded+RNAinsight. Results should be available within approximately 2-3 weeks' time, at which point they will be  disclosed by telephone to [name] Wright, as will any additional recommendations warranted by these results. [name] Wright will receive a summary of her genetic counseling visit and a copy of her results once available. This information will also be available in Epic.   Lastly, we encouraged [name] Wright to remain in contact with cancer genetics annually so that we can continuously update the family history and inform her of any changes in cancer genetics and testing that may be of benefit for this family.   [name] Wright questions were answered to her satisfaction today. Our contact information was provided should additional questions or concerns arise. Thank you for the referral and allowing [place] to share in the care of your patient.   Cree Napoli P. Florene Glen, [place], [facility] Licensed, Insurance risk surveyor [email or web address] phone: [phone]  The patient was seen for a total of 40 minutes in face-to-face genetic counseling.  The patient was seen alone.  This patient was discussed with Drs. Magrinat, Lindi Adie and/or Burr Medico who agrees with the above.    _______________________________________________________________________ For Office Staff:  Number of people involved in session: 1 Was an Intern/ student involved with case: no

## 2021-02-26 ENCOUNTER — Telehealth: Payer: Medicare Other | Admitting: Emergency Medicine

## 2021-02-26 DIAGNOSIS — B9789 Other viral agents as the cause of diseases classified elsewhere: Secondary | ICD-10-CM

## 2021-02-26 DIAGNOSIS — J329 Chronic sinusitis, unspecified: Secondary | ICD-10-CM

## 2021-02-26 MED ORDER — AZELASTINE HCL 0.1 % NA SOLN: 1.0000 | Freq: Two times a day (BID) | NASAL | 12 refills | Status: DC

## 2021-02-26 NOTE — Progress Notes (Signed)
I have spent 5 minutes in review of e-visit questionnaire, review and updating patient chart, medical decision making and response to patient.   Vallarie Fei, PA-C    

## 2021-02-26 NOTE — Progress Notes (Signed)
E-Visit for Sinus Problems  We are sorry that you are not feeling well.  Here is how we plan to help!  Based on what you have shared with me it looks like you have sinusitis.  Sinusitis is inflammation and infection in the sinus cavities of the head.  Based on your presentation (length of symptoms less than 5 days) I believe you most likely have Acute Viral Sinusitis.This is an infection most likely caused by a virus. There is not specific treatment for viral sinusitis other than to help you with the symptoms until the infection runs its course.  You may use an oral decongestant such as Mucinex D or if you have glaucoma or high blood pressure use plain Mucinex. Saline nasal spray help and can safely be used as often as needed for congestion, I have prescribed: Azelastine nasal spray 2 sprays in each nostril twice a day  Some authorities believe that zinc sprays or the use of Echinacea may shorten the course of your symptoms.  Sinus infections are not as easily transmitted as other respiratory infection, however we still recommend that you avoid close contact with loved ones, especially the very young and elderly.  Remember to wash your hands thoroughly throughout the day as this is the number one way to prevent the spread of infection!  Home Care: Only take medications as instructed by your medical team. Do not take these medications with alcohol. A steam or ultrasonic humidifier can help congestion.  You can place a towel over your head and breathe in the steam from hot water coming from a faucet. Avoid close contacts especially the very young and the elderly. Cover your mouth when you cough or sneeze. Always remember to wash your hands.  Get Help Right Away If: You develop worsening fever or sinus pain. You develop a severe head ache or visual changes. Your symptoms persist after you have completed your treatment plan.  Make sure you Understand these instructions. Will watch your  condition. Will get help right away if you are not doing well or get worse.   Thank you for choosing an e-visit.  Your e-visit answers were reviewed by a board certified advanced clinical practitioner to complete your personal care plan. Depending upon the condition, your plan could have included both over the counter or prescription medications.  Please review your pharmacy choice. Make sure the pharmacy is open so you can pick up prescription now. If there is a problem, you may contact your provider through [employer] and have the prescription routed to another pharmacy.  Your safety is important to [place]. If you have drug allergies check your prescription carefully.   For the next 24 hours you can use MyChart to ask questions about today's visit, request a non-urgent call back, or ask for a work or school excuse. You will get an email in the next two days asking about your experience. I hope that your e-visit has been valuable and will speed your recovery.

## 2021-02-28 ENCOUNTER — Telehealth: Payer: Medicare Other | Admitting: Family Medicine

## 2021-02-28 DIAGNOSIS — J0140 Acute pansinusitis, unspecified: Secondary | ICD-10-CM

## 2021-02-28 MED ORDER — AMOXICILLIN-POT CLAVULANATE 875-125 MG PO TABS: 1.0000 | ORAL_TABLET | Freq: Two times a day (BID) | ORAL | 0 refills | Status: AC

## 2021-02-28 NOTE — Progress Notes (Signed)

## 2021-03-08 ENCOUNTER — Ambulatory Visit: Payer: Self-pay | Admitting: Genetic Counselor

## 2021-03-08 ENCOUNTER — Telehealth: Payer: Self-pay | Admitting: Genetic Counselor

## 2021-03-08 ENCOUNTER — Encounter: Payer: Self-pay | Admitting: Genetic Counselor

## 2021-03-08 DIAGNOSIS — Z1379 Encounter for other screening for genetic and chromosomal anomalies: Secondary | ICD-10-CM

## 2021-03-08 NOTE — Telephone Encounter (Signed)
Revealed negative genetic testing.  Discussed that we do not know why she has polyposis or why there is cancer in the family. It could be due to a different gene that we are not testing, or maybe our current technology may not be able to pick something up.  It will be important for her to keep in contact with genetics to keep up with whether additional testing may be needed.  Two VUS identified that will not change medical management.

## 2021-03-08 NOTE — Progress Notes (Signed)
HPI:  Ms. Dragovich was previously seen in the [facility] clinic due to a personal history of cancer and family history of cancer and concerns regarding a hereditary predisposition to cancer. Please refer to our prior cancer genetics clinic note for more information regarding our discussion, assessment and recommendations, at the time. Ms. Eisenhower recent genetic test results were disclosed to her, as were recommendations warranted by these results. These results and recommendations are discussed in more detail below.  CANCER HISTORY:  Oncology History   No history exists.    FAMILY HISTORY:  We obtained a detailed, 4-generation family history.  Significant diagnoses are listed below: Family History  Problem Relation Age of Onset   Multiple sclerosis Mother 41   Bladder Cancer Mother 33   Heart Problems Father    Dementia Paternal Aunt    Ovarian cancer Maternal Grandmother    Lung cancer Maternal Grandfather    Tuberculosis Paternal Grandmother       The patient does not have children.  She has a brother who is cancer free.  Both parents are deceased.   The patient's father died of heart problems.  He had a sister who had dementia.  His parents are deceased.  His father died of old age at 57 and his mother died of TB.  They were first cousins.   The patient's mother had bladder cancer at 78.  She has a brother who is cancer free.  Her parent are deceased.  Her mother had ovarian cancer.   Ms. Bruski is unaware of previous family history of genetic testing for hereditary cancer risks. Patient's maternal ancestors are of [place] descent, and paternal ancestors are of English descent. There is no reported Ashkenazi Jewish ancestry. There is no known consanguinity  GENETIC TEST RESULTS: Genetic testing reported out on March 08, 2021 through the CancerNext-Expanded+RNAinsight cancer panel found no pathogenic mutations. The CancerNext-Expanded gene panel offered by  [facility] and includes sequencing and rearrangement analysis for the following 77 genes: AIP, ALK, APC*, ATM*, AXIN2, BAP1, BARD1, BLM, BMPR1A, BRCA1*, BRCA2*, BRIP1*, CDC73, CDH1*, CDK4, CDKN1B, CDKN2A, CHEK2*, CTNNA1, DICER1, FANCC, FH, FLCN, GALNT12, KIF1B, LZTR1, MAX, MEN1, MET, MLH1*, MSH2*, MSH3, MSH6*, MUTYH*, NBN, NF1*, NF2, NTHL1, PALB2*, PHOX2B, PMS2*, POT1, PRKAR1A, PTCH1, PTEN*, RAD51C*, RAD51D*, RB1, RECQL, RET, SDHA, SDHAF2, SDHB, SDHC, SDHD, SMAD4, SMARCA4, SMARCB1, SMARCE1, STK11, SUFU, TMEM127, TP53*, TSC1, TSC2, VHL and XRCC2 (sequencing and deletion/duplication); EGFR, EGLN1, HOXB13, KIT, MITF, PDGFRA, POLD1, and POLE (sequencing only); EPCAM and GREM1 (deletion/duplication only). DNA and RNA analyses performed for * genes. The test report has been scanned into EPIC and is located under the Molecular Pathology section of the Results Review tab.  A portion of the result report is included below for reference.     We discussed with Ms. Abee that because current genetic testing is not perfect, it is possible there may be a gene mutation in one of these genes that current testing cannot detect, but that chance is small.  We also discussed, that there could be another gene that has not yet been discovered, or that we have not yet tested, that is responsible for the cancer diagnoses in the family. It is also possible there is a hereditary cause for the cancer in the family that Ms. Fasching did not inherit and therefore was not identified in her testing.  Therefore, it is important to remain in touch with cancer genetics in the future so that we can continue to offer Ms. Mortimer Fries  the most up to date genetic testing.   Genetic testing did identify two Variants of uncertain significance (VUS) - one in the ATM gene called p.Q2397H, and a second in the GALNT12 gene called p.A551V.  At this time, it is unknown if these variants are associated with increased cancer risk or if they are normal  findings, but most variants such as these get reclassified to being inconsequential. They should not be used to make medical management decisions. With time, we suspect the lab will determine the significance of these variants, if any. If we do learn more about them, we will try to contact Ms. Renfrow to discuss it further. However, it is important to stay in touch with [place] periodically and keep the address and phone number up to date.  ADDITIONAL GENETIC TESTING: We discussed with Ms. Persky that her genetic testing was fairly extensive.  If there are genes identified to increase cancer risk that can be analyzed in the future, we would be happy to discuss and coordinate this testing at that time.    CANCER SCREENING RECOMMENDATIONS: Ms. Diss test result is considered negative (normal).  This means that we have not identified a hereditary cause for her personal history of polyposis and family history of cancer at this time. Most cancers happen by chance and this negative test suggests that her cancer may fall into this category.    While reassuring, this does not definitively rule out a hereditary predisposition to cancer. It is still possible that there could be genetic mutations that are undetectable by current technology. There could be genetic mutations in genes that have not been tested or identified to increase cancer risk.  Therefore, it is recommended she continue to follow the cancer management and screening guidelines provided by her primary healthcare provider.   An individual's cancer risk and medical management are not determined by genetic test results alone. Overall cancer risk assessment incorporates additional factors, including personal medical history, family history, and any available genetic information that may result in a personalized plan for cancer prevention and surveillance  This negative genetic test simply tells [place] that we cannot yet define why Ms. Ellwood has had  an increased number of colorectal polyps. Ms. Zukowski's medical management and screening should be based on the prospect that she will likely form more colon polyps in the future and should, therefore, undergo more frequent colonoscopy screening at intervals determined by her GI providers.  We also recommended that Ms. Goding have an upper endoscopy periodically.  RECOMMENDATIONS FOR FAMILY MEMBERS:  Individuals in this family might be at some increased risk of developing cancer, over the general population risk, simply due to the family history of cancer.  We recommended women in this family have a yearly mammogram beginning at age 42, or 2 years younger than the earliest onset of cancer, an annual clinical breast exam, and perform monthly breast self-exams. Women in this family should also have a gynecological exam as recommended by their primary provider. All family members should be referred for colonoscopy starting at age 33.  FOLLOW-UP: Lastly, we discussed with Ms. Cerros that cancer genetics is a rapidly advancing field and it is possible that new genetic tests will be appropriate for her and/or her family members in the future. We encouraged her to remain in contact with cancer genetics on an annual basis so we can update her personal and family histories and let her know of advances in cancer genetics that may benefit this family.  Our contact number was provided. Ms. Mckeel questions were answered to her satisfaction, and she knows she is welcome to call us at anytime with additional questions or concerns.   Roma Kayser, [place], [facility] Licensed, Certified Genetic Counselor [email or web address]

## 2021-03-21 DIAGNOSIS — R7309 Other abnormal glucose: Secondary | ICD-10-CM | POA: Diagnosis not present

## 2021-03-21 DIAGNOSIS — H16223 Keratoconjunctivitis sicca, not specified as Sjogren's, bilateral: Secondary | ICD-10-CM | POA: Diagnosis not present

## 2021-03-21 DIAGNOSIS — H57813 Brow ptosis, bilateral: Secondary | ICD-10-CM | POA: Diagnosis not present

## 2021-03-21 DIAGNOSIS — H02831 Dermatochalasis of right upper eyelid: Secondary | ICD-10-CM | POA: Diagnosis not present

## 2021-03-21 DIAGNOSIS — Z961 Presence of intraocular lens: Secondary | ICD-10-CM | POA: Diagnosis not present

## 2021-03-21 DIAGNOSIS — H26493 Other secondary cataract, bilateral: Secondary | ICD-10-CM | POA: Diagnosis not present

## 2021-03-21 DIAGNOSIS — H527 Unspecified disorder of refraction: Secondary | ICD-10-CM | POA: Diagnosis not present

## 2021-03-21 DIAGNOSIS — H02834 Dermatochalasis of left upper eyelid: Secondary | ICD-10-CM | POA: Diagnosis not present

## 2021-03-21 DIAGNOSIS — H0100B Unspecified blepharitis left eye, upper and lower eyelids: Secondary | ICD-10-CM | POA: Diagnosis not present

## 2021-03-21 DIAGNOSIS — D23112 Other benign neoplasm of skin of right lower eyelid, including canthus: Secondary | ICD-10-CM | POA: Diagnosis not present

## 2021-03-21 DIAGNOSIS — H0100A Unspecified blepharitis right eye, upper and lower eyelids: Secondary | ICD-10-CM | POA: Diagnosis not present

## 2021-03-21 DIAGNOSIS — H52223 Regular astigmatism, bilateral: Secondary | ICD-10-CM | POA: Diagnosis not present

## 2021-03-21 LAB — HM DIABETES EYE EXAM

## 2021-03-30 ENCOUNTER — Encounter: Payer: Self-pay | Admitting: Internal Medicine

## 2021-04-04 ENCOUNTER — Telehealth: Payer: Medicare Other | Admitting: Family

## 2021-04-04 DIAGNOSIS — J019 Acute sinusitis, unspecified: Secondary | ICD-10-CM

## 2021-04-05 NOTE — Progress Notes (Signed)
See previous evisits.   Evelina Dun, FNP

## 2021-04-05 NOTE — Progress Notes (Signed)
Based on what you shared with me, I feel your condition warrants further evaluation and I recommend that you be seen in a face to face visit.   Given your symptoms are recurrent, you need to be seen in person for further evaluation.    NOTE: There will be NO CHARGE for this eVisit   If you are having a true medical emergency please call 911.      For an urgent face to face visit, [facility] has six urgent care centers for your convenience:     [facility] [place] [facility] [place] Get Driving Directions [phone] [address] [place], [place] [address]    [facility]) Get Driving Directions [phone] [place], [place] [address]  [facility] [place] ([place]) Get Driving Directions [phone] [address] [place],  [place]  [address]  [facility] Get Driving Directions [phone] [address] [place], [place], [place] [address]   [facility] Get Driving Directions  [phone] [address] [place], [place] [address]   [facility] [place] Get Driving Directions [phone] [address], [place], [place] [address]  Your MyChart E-visit questionnaire answers were reviewed by a board certified advanced clinical practitioner to complete your personal care plan based on your specific symptoms.  Thank you for using e-Visits.

## 2021-04-10 ENCOUNTER — Telehealth: Payer: Medicare Other | Admitting: Physician Assistant

## 2021-04-10 ENCOUNTER — Encounter: Payer: Self-pay | Admitting: Physician Assistant

## 2021-04-10 DIAGNOSIS — J329 Chronic sinusitis, unspecified: Secondary | ICD-10-CM | POA: Diagnosis not present

## 2021-04-10 MED ORDER — LEVOFLOXACIN 500 MG PO TABS: 500.0000 mg | ORAL_TABLET | Freq: Every day | ORAL | 0 refills | Status: AC

## 2021-04-10 NOTE — Progress Notes (Signed)
Virtual Visit Consent   [name] Wright, you are scheduled for a virtual visit with a [facility] provider today.     Just as with appointments in the office, your consent must be obtained to participate.  Your consent will be active for this visit and any virtual visit you may have with one of our providers in the next 365 days.     If you have a MyChart account, a copy of this consent can be sent to you electronically.  All virtual visits are billed to your insurance company just like a traditional visit in the office.    As this is a virtual visit, video technology does not allow for your provider to perform a traditional examination.  This may limit your provider's ability to fully assess your condition.  If your provider identifies any concerns that need to be evaluated in person or the need to arrange testing (such as labs, EKG, etc.), we will make arrangements to do so.     Although advances in technology are sophisticated, we cannot ensure that it will always work on either your end or our end.  If the connection with a video visit is poor, the visit may have to be switched to a telephone visit.  With either a video or telephone visit, we are not always able to ensure that we have a secure connection.     I need to obtain your verbal consent now.   Are you willing to proceed with your visit today?    [name] Wright has provided verbal consent on 04/10/2021 for a virtual visit (video or telephone).   Leeanne Rio, [place]   Date: 04/10/2021 10:10 AM   Virtual Visit via Video Note   I, Leeanne Rio, connected with  [name] Wright  ([id number], 03-08-1966) on 04/10/21 at 10:00 AM EST by a video-enabled telemedicine application and verified that I am speaking with the correct person using two identifiers.  Location: Patient: Virtual Visit Location Patient: Home Provider: Virtual Visit Location Provider: Home Office   I discussed the limitations of evaluation  and management by telemedicine and the availability of in person appointments. The patient expressed understanding and agreed to proceed.    History of Present Illness: [name] Wright is a 56 y.o. who identifies as a female who was assigned female at birth, and is being seen today for concern for persistent sinusitis and now with an ear infection. Notes being diagnosed about 2.5 weeks ago with sinusitis and started on a round of Augmentin which she took as directed until completed. Noted substantial improvement in her symptoms while on antibiotic but without full resolution. Since stopping antibiotic symptoms have returned with a vengeance. Notes sinus pain and now ear pain. Possible intermittent low-grade fever but she is not certain. Notes she usually has to have a round of Levaquin for her sinuses as she has had ongoing issues with sinusitis since she was younger.   HPI: HPI  Problems:  Patient Active Problem List   Diagnosis Date Noted   Genetic testing 03/08/2021   Family history of ovarian cancer 02/16/2021   Uncontrolled type 2 diabetes mellitus with hyperglycemia ([place]) 02/07/2021   Essential hypertension, benign 02/07/2021   Gastritis and gastroduodenitis    Nausea without vomiting    Adenomatous polyp of ascending colon    Adenomatous polyp of transverse colon    Adenomatous polyp of sigmoid colon    Rectal polyp    Adenomatous polyp of descending  colon    Class 3 severe obesity due to excess calories with serious comorbidity and body mass index (BMI) of 60.0 to 69.9 in adult [facility]) 08/13/2019   Nasal congestion 03/10/2019   Sinus pressure 03/10/2019   Hyperglycemia 01/22/2019   Hypertriglyceridemia 01/22/2019   Encounter for preventative adult health care exam with abnormal findings 09/01/2015   Sinusitis, chronic 09/01/2015   Cough 09/01/2015   RUQ pain 10/27/2014   Urinary incontinence 09/17/2013   Acute sinus infection 10/16/2012   Elevated BP 07/29/2012   Fatigue  07/10/2012   Leg cramps 04/15/2011   Edema 12/21/2010   Chronic pain due to trauma 03/15/2007   ASTHMA, CHILDHOOD 03/15/2007   INSOMNIA, CHRONIC 11/01/2006   ALLERGIC RHINITIS 11/01/2006   DYSPEPSIA, CHRONIC 11/01/2006   HEAD TRAUMA, CLOSED 11/01/2006    Allergies:  Allergies  Allergen Reactions   Doxycycline Anaphylaxis, Diarrhea and Nausea And Vomiting    headache   Oxycodone-Acetaminophen Hives and Nausea And Vomiting   Prochlorperazine Edisylate Other (See Comments)    Hyper and jitteriness   Amoxicillin Rash and Other (See Comments)    Has patient had a PCN reaction causing immediate rash, facial/tongue/throat swelling, SOB or lightheadedness with hypotension: Unknown Has patient had a PCN reaction causing severe rash involving mucus membranes or skin necrosis: Unknown Has patient had a PCN reaction that required hospitalization: No Has patient had a PCN reaction occurring within the last 10 years: Yes If all of the above answers are "NO", then may proceed with Cephalosporin use.    Compazine Other (See Comments)    Hyper/jitters/blotches   Eszopiclone Other (See Comments)    Metallic taste and became un effective    Keppra [Levetiracetam] Other (See Comments)    hyperactive   Latex    Levetiracetam Other (See Comments)    Jitters/hyper   Neurontin [Gabapentin] Nausea And Vomiting   Propoxyphene Hives   Tape     PAPER TAPE-skin burns/severe irritation  PATIENT DOES NOT TOLERATE PAPER TAPE   Trileptal [Oxcarbazepine] Other (See Comments)    REACTION: immune system suppressed   Bactrim [Sulfamethoxazole-Trimethoprim] Rash   Bupropion Rash   Codeine Rash   Hydrocodone-Acetaminophen Rash   Moxifloxacin Swelling and Rash   Propoxyphene N-Acetaminophen Nausea And Vomiting and Rash   Vicodin [Hydrocodone-Acetaminophen] Rash   Medications:  Current Outpatient Medications:    levofloxacin (LEVAQUIN) 500 MG tablet, Take 1 tablet (500 mg total) by mouth daily for 7  days., Disp: 7 tablet, Rfl: 0   albuterol (PROAIR HFA) 108 (90 Base) MCG/ACT inhaler, Inhale 2 puffs into the lungs every 4 (four) hours as needed for wheezing or shortness of breath., Disp: 1 Inhaler, Rfl: 1   azelastine (ASTELIN) 0.1 % nasal spray, Place 1 spray into both nostrils 2 (two) times daily. Use in each nostril as directed, Disp: 30 mL, Rfl: 12   baclofen (LIORESAL) 10 MG tablet, Take by mouth., Disp: , Rfl:    butalbital-acetaminophen-caffeine (FIORICET) 50-325-40 MG tablet, Take 1 tablet by mouth every 4 (four) hours as needed., Disp: , Rfl:    diphenhydrAMINE (BENADRYL) 25 mg capsule, Take 25 mg by mouth every 6 (six) hours as needed for allergies. , Disp: , Rfl:    famotidine (PEPCID) 20 MG tablet, Take 1 tablet (20 mg total) by mouth 2 (two) times daily., Disp: 60 tablet, Rfl: 1   fluticasone (FLONASE) 50 MCG/ACT nasal spray, Place 1 spray into both nostrils in the morning., Disp: , Rfl:    levothyroxine (SYNTHROID) 100 MCG  tablet, TAKE 1 TABLET BY MOUTH BEFORE BREAKFAST, Disp: 90 tablet, Rfl: 0   losartan (COZAAR) 25 MG tablet, Take 1 tablet (25 mg total) by mouth daily. 1/2 tab daily, Disp: 45 tablet, Rfl: 3   meclizine (ANTIVERT) 25 MG tablet, Take 12.5-25 mg by mouth 2 (two) times daily as needed for dizziness (vertigo)., Disp: , Rfl:    methocarbamol (ROBAXIN) 750 MG tablet, Take 500 mg by mouth every 6 (six) hours as needed for muscle spasms. 1 at bedtime, Disp: , Rfl:    morphine (MSIR) 15 MG tablet, Take 15 mg by mouth 4 (four) times daily as needed for severe pain. , Disp: , Rfl:    Multiple Vitamins-Minerals (MAXIMUM DAILY GREEN PO), Take 1 Package by mouth 3 (three) times a week. It Works! Greens, Disp: , Rfl:    promethazine (PHENERGAN) 25 MG tablet, TAKE 1/2 TO 1 (ONE-HALF TO ONE) TABLET BY MOUTH EVERY 8 HOURS AS NEEDED FOR NAUSEA, Disp: 40 tablet, Rfl: 0   RESTASIS 0.05 % ophthalmic emulsion, Place 1 drop into both eyes 2 (two) times daily., Disp: , Rfl:     Semaglutide,0.25 or 0.5MG /DOS, (OZEMPIC, 0.25 OR 0.5 MG/DOSE,) 2 MG/1.5ML SOPN, Inject 0.25 mg into the skin once a week., Disp: 1.5 mL, Rfl: 3   SUDOGEST MAXIMUM STRENGTH 30 MG tablet, TAKE 1 TABLET BY MOUTH TWICE DAILY AS NEEDED FOR CONGESTION, Disp: 90 tablet, Rfl: 0   traMADol (ULTRAM) 50 MG tablet, TAKE 1 TABLET BY MOUTH EVERY 6 HOURS AS NEEDED, Disp: 30 tablet, Rfl: 0   Vitamin D, Ergocalciferol, (DRISDOL) 1.25 MG (50000 UNIT) CAPS capsule, Take 1 capsule (50,000 Units total) by mouth every 7 (seven) days., Disp: 12 capsule, Rfl: 0  Observations/Objective: Patient is well-developed, well-nourished in no acute distress.  Resting comfortably at home.  Head is normocephalic, atraumatic.  No labored breathing. Speech is clear and coherent with logical content.  Patient is alert and oriented at baseline.   Assessment and Plan: 1. Recurrent sinusitis - levofloxacin (LEVAQUIN) 500 MG tablet; Take 1 tablet (500 mg total) by mouth daily for 7 days.  Dispense: 7 tablet; Refill: 0  Multiple drug allergies/intolerances. Has been on Levaquin multiple times in the past for similar issues. Will Rx Levaquin 500 mg daily x 7 days. Supportive measures and OTC medications discussed. Strict UC precautions reviewed with patient.   Follow Up Instructions: I discussed the assessment and treatment plan with the patient. The patient was provided an opportunity to ask questions and all were answered. The patient agreed with the plan and demonstrated an understanding of the instructions.  A copy of instructions were sent to the patient via MyChart unless otherwise noted below.   The patient was advised to call back or seek an in-person evaluation if the symptoms worsen or if the condition fails to improve as anticipated.  Time:  I spent 12 minutes with the patient via telehealth technology discussing the above problems/concerns.    Leeanne Rio, PA-C

## 2021-04-10 NOTE — Patient Instructions (Signed)
[name] Wright, thank you for joining Leeanne Rio, PA-C for today's virtual visit.  While this provider is not your primary care provider (PCP), if your PCP is located in our provider database this encounter information will be shared with them immediately following your visit.  Consent: (Patient) [name] Wright provided verbal consent for this virtual visit at the beginning of the encounter.  Current Medications:  Current Outpatient Medications:    levofloxacin (LEVAQUIN) 500 MG tablet, Take 1 tablet (500 mg total) by mouth daily for 7 days., Disp: 7 tablet, Rfl: 0   albuterol (PROAIR HFA) 108 (90 Base) MCG/ACT inhaler, Inhale 2 puffs into the lungs every 4 (four) hours as needed for wheezing or shortness of breath., Disp: 1 Inhaler, Rfl: 1   azelastine (ASTELIN) 0.1 % nasal spray, Place 1 spray into both nostrils 2 (two) times daily. Use in each nostril as directed, Disp: 30 mL, Rfl: 12   baclofen (LIORESAL) 10 MG tablet, Take by mouth., Disp: , Rfl:    butalbital-acetaminophen-caffeine (FIORICET) 50-325-40 MG tablet, Take 1 tablet by mouth every 4 (four) hours as needed., Disp: , Rfl:    diphenhydrAMINE (BENADRYL) 25 mg capsule, Take 25 mg by mouth every 6 (six) hours as needed for allergies. , Disp: , Rfl:    famotidine (PEPCID) 20 MG tablet, Take 1 tablet (20 mg total) by mouth 2 (two) times daily., Disp: 60 tablet, Rfl: 1   fluticasone (FLONASE) 50 MCG/ACT nasal spray, Place 1 spray into both nostrils in the morning., Disp: , Rfl:    levothyroxine (SYNTHROID) 100 MCG tablet, TAKE 1 TABLET BY MOUTH BEFORE BREAKFAST, Disp: 90 tablet, Rfl: 0   losartan (COZAAR) 25 MG tablet, Take 1 tablet (25 mg total) by mouth daily. 1/2 tab daily, Disp: 45 tablet, Rfl: 3   meclizine (ANTIVERT) 25 MG tablet, Take 12.5-25 mg by mouth 2 (two) times daily as needed for dizziness (vertigo)., Disp: , Rfl:    methocarbamol (ROBAXIN) 750 MG tablet, Take 500 mg by mouth every 6 (six) hours as  needed for muscle spasms. 1 at bedtime, Disp: , Rfl:    morphine (MSIR) 15 MG tablet, Take 15 mg by mouth 4 (four) times daily as needed for severe pain. , Disp: , Rfl:    Multiple Vitamins-Minerals (MAXIMUM DAILY GREEN PO), Take 1 Package by mouth 3 (three) times a week. It Works! Greens, Disp: , Rfl:    promethazine (PHENERGAN) 25 MG tablet, TAKE 1/2 TO 1 (ONE-HALF TO ONE) TABLET BY MOUTH EVERY 8 HOURS AS NEEDED FOR NAUSEA, Disp: 40 tablet, Rfl: 0   RESTASIS 0.05 % ophthalmic emulsion, Place 1 drop into both eyes 2 (two) times daily., Disp: , Rfl:    Semaglutide,0.25 or 0.5MG /DOS, (OZEMPIC, 0.25 OR 0.5 MG/DOSE,) 2 MG/1.5ML SOPN, Inject 0.25 mg into the skin once a week., Disp: 1.5 mL, Rfl: 3   SUDOGEST MAXIMUM STRENGTH 30 MG tablet, TAKE 1 TABLET BY MOUTH TWICE DAILY AS NEEDED FOR CONGESTION, Disp: 90 tablet, Rfl: 0   traMADol (ULTRAM) 50 MG tablet, TAKE 1 TABLET BY MOUTH EVERY 6 HOURS AS NEEDED, Disp: 30 tablet, Rfl: 0   Vitamin D, Ergocalciferol, (DRISDOL) 1.25 MG (50000 UNIT) CAPS capsule, Take 1 capsule (50,000 Units total) by mouth every 7 (seven) days., Disp: 12 capsule, Rfl: 0   Medications ordered in this encounter:  Meds ordered this encounter  Medications   levofloxacin (LEVAQUIN) 500 MG tablet    Sig: Take 1 tablet (500 mg total) by mouth daily  for 7 days.    Dispense:  7 tablet    Refill:  0    Order Specific Question:   Supervising Provider    Answer:   Sabra Heck, BRIAN [[id number]]     *If you need refills on other medications prior to your next appointment, please contact your pharmacy*  Follow-Up: Call back or seek an in-person evaluation if the symptoms worsen or if the condition fails to improve as anticipated.  Other Instructions Please take antibiotic as directed.  Increase fluid intake.  Use Saline nasal spray.  Take a daily multivitamin. Continue Flonase spray.  Place a humidifier in the bedroom.  Please call or return clinic if symptoms are not  improving.  Sinusitis Sinusitis is redness, soreness, and swelling (inflammation) of the paranasal sinuses. Paranasal sinuses are air pockets within the bones of your face (beneath the eyes, the middle of the forehead, or above the eyes). In healthy paranasal sinuses, mucus is able to drain out, and air is able to circulate through them by way of your nose. However, when your paranasal sinuses are inflamed, mucus and air can become trapped. This can allow bacteria and other germs to grow and cause infection. Sinusitis can develop quickly and last only a short time (acute) or continue over a long period (chronic). Sinusitis that lasts for more than 12 weeks is considered chronic.  CAUSES  Causes of sinusitis include: Allergies. Structural abnormalities, such as displacement of the cartilage that separates your nostrils (deviated septum), which can decrease the air flow through your nose and sinuses and affect sinus drainage. Functional abnormalities, such as when the small hairs (cilia) that line your sinuses and help remove mucus do not work properly or are not present. SYMPTOMS  Symptoms of acute and chronic sinusitis are the same. The primary symptoms are pain and pressure around the affected sinuses. Other symptoms include: Upper toothache. Earache. Headache. Bad breath. Decreased sense of smell and taste. A cough, which worsens when you are lying flat. Fatigue. Fever. Thick drainage from your nose, which often is green and may contain pus (purulent). Swelling and warmth over the affected sinuses. DIAGNOSIS  Your caregiver will perform a physical exam. During the exam, your caregiver may: Look in your nose for signs of abnormal growths in your nostrils (nasal polyps). Tap over the affected sinus to check for signs of infection. View the inside of your sinuses (endoscopy) with a special imaging device with a light attached (endoscope), which is inserted into your sinuses. If your  caregiver suspects that you have chronic sinusitis, one or more of the following tests may be recommended: Allergy tests. Nasal culture A sample of mucus is taken from your nose and sent to a lab and screened for bacteria. Nasal cytology A sample of mucus is taken from your nose and examined by your caregiver to determine if your sinusitis is related to an allergy. TREATMENT  Most cases of acute sinusitis are related to a viral infection and will resolve on their own within 10 days. Sometimes medicines are prescribed to help relieve symptoms (pain medicine, decongestants, nasal steroid sprays, or saline sprays).  However, for sinusitis related to a bacterial infection, your caregiver will prescribe antibiotic medicines. These are medicines that will help kill the bacteria causing the infection.  Rarely, sinusitis is caused by a fungal infection. In theses cases, your caregiver will prescribe antifungal medicine. For some cases of chronic sinusitis, surgery is needed. Generally, these are cases in which sinusitis recurs more than 3  times per year, despite other treatments. HOME CARE INSTRUCTIONS  Drink Wright of water. Water helps thin the mucus so your sinuses can drain more easily. Use a humidifier. Inhale steam 3 to 4 times a day (for example, sit in the bathroom with the shower running). Apply a warm, moist washcloth to your face 3 to 4 times a day, or as directed by your caregiver. Use saline nasal sprays to help moisten and clean your sinuses. Take over-the-counter or prescription medicines for pain, discomfort, or fever only as directed by your caregiver. SEEK IMMEDIATE MEDICAL CARE IF: You have increasing pain or severe headaches. You have nausea, vomiting, or drowsiness. You have swelling around your face. You have vision problems. You have a stiff neck. You have difficulty breathing. MAKE SURE YOU:  Understand these instructions. Will watch your condition. Will get help right away  if you are not doing well or get worse. Document Released: 03/26/2005 Document Revised: 06/18/2011 Document Reviewed: 04/10/2011 [facility] Patient Information 2014 [place], [place].    If you have been instructed to have an in-person evaluation today at a local Urgent Care facility, please use the link below. It will take you to a list of all of our available [facility], including address, phone number and hours of operation. Please do not delay care.  [facility]  If you or a family member do not have a primary care provider, use the link below to schedule a visit and establish care. When you choose a [facility] primary care physician or advanced practice provider, you gain a long-term partner in health. Find a Primary Care Provider  Learn more about [facility] in-office and virtual care options: [facility]

## 2021-04-11 ENCOUNTER — Encounter: Payer: Self-pay | Admitting: Internal Medicine

## 2021-04-19 ENCOUNTER — Encounter: Payer: Self-pay | Admitting: Internal Medicine

## 2021-04-20 ENCOUNTER — Encounter: Payer: Self-pay | Admitting: Nurse Practitioner

## 2021-04-20 ENCOUNTER — Ambulatory Visit (INDEPENDENT_AMBULATORY_CARE_PROVIDER_SITE_OTHER): Payer: Medicare Other | Admitting: Nurse Practitioner

## 2021-04-20 ENCOUNTER — Other Ambulatory Visit: Payer: Self-pay

## 2021-04-20 VITALS — BP 122/80 | HR 94 | Temp 98.4°F | Ht 63.6 in | Wt 377.4 lb

## 2021-04-20 DIAGNOSIS — R059 Cough, unspecified: Secondary | ICD-10-CM | POA: Diagnosis not present

## 2021-04-20 DIAGNOSIS — R200 Anesthesia of skin: Secondary | ICD-10-CM | POA: Diagnosis not present

## 2021-04-20 DIAGNOSIS — M542 Cervicalgia: Secondary | ICD-10-CM | POA: Diagnosis not present

## 2021-04-20 DIAGNOSIS — H9203 Otalgia, bilateral: Secondary | ICD-10-CM

## 2021-04-20 DIAGNOSIS — R0789 Other chest pain: Secondary | ICD-10-CM

## 2021-04-20 DIAGNOSIS — Z79899 Other long term (current) drug therapy: Secondary | ICD-10-CM | POA: Diagnosis not present

## 2021-04-20 DIAGNOSIS — E1165 Type 2 diabetes mellitus with hyperglycemia: Secondary | ICD-10-CM | POA: Diagnosis not present

## 2021-04-20 DIAGNOSIS — R519 Headache, unspecified: Secondary | ICD-10-CM | POA: Diagnosis not present

## 2021-04-20 DIAGNOSIS — G894 Chronic pain syndrome: Secondary | ICD-10-CM | POA: Diagnosis not present

## 2021-04-20 MED ORDER — PEN NEEDLES 32G X 6 MM MISC: 1.0000 | 1 refills | Status: DC

## 2021-04-20 MED ORDER — AZELASTINE HCL 0.1 % NA SOLN: 2.0000 | Freq: Two times a day (BID) | NASAL | 2 refills | Status: DC

## 2021-04-20 NOTE — Progress Notes (Signed)
I,[name] Wright,acting as a Education administrator for [employer], FNP.,have documented all relevant documentation on the behalf of [name] Brine, FNP,as directed by  [name] Brine, FNP while in the presence of [name] Wright, [name] Wright.   This visit occurred during the SARS-CoV-2 public health emergency.  Safety protocols were in place, including screening questions prior to the visit, additional usage of staff PPE, and extensive cleaning of exam room while observing appropriate contact time as indicated for disinfecting solutions.  Subjective:     Patient ID: [name] Wright , female    DOB: 03/18/1966 , 56 y.o.   MRN: [id number]   Chief Complaint  Patient presents with   Ear Pain    HPI  Patient presents today for ear pain in both her ears. She stated she was diagnosed with a sinus infection a few weeks ago and was given some antibiotics.  She did an evisit with cone and treated with antibiotics for an ear infection. She went to urgent care for as well for ear infection.   Wt Readings from Last 3 Encounters: 04/20/21 : (!) 377 lb 6.4 oz (171.2 kg) 02/07/21 : (!) 374 lb 9.6 oz (169.9 kg) 01/04/21 : (!) 368 lb (166.9 kg)   .   Otalgia  There is pain in both ears. This is a recurrent problem. The current episode started more than 1 month ago. The problem occurs constantly. The problem has been unchanged. There has been no fever. Associated symptoms include coughing. Pertinent negatives include no ear discharge or headaches. She has tried antibiotics for the symptoms. The treatment provided no relief.    Past Medical History:  Diagnosis Date   Acute cystitis    Allergic rhinitis    Childhood asthma    no problems as adult - no inhaler   Chronic insomnia    Chronic pain due to trauma 02/2001   closed head trauma - Followed by Dr [facility]   Complication of anesthesia    Dyspepsia    Family history of ovarian cancer    Head trauma 02/24/2001   closed    History of kidney stones    passed stone - no surgery required   Hypothyroidism    PONV (postoperative nausea and vomiting)    Pre-diabetes    SVD (spontaneous vaginal delivery)    fetal demise at 32 months     Family History  Problem Relation Age of Onset   Multiple sclerosis Mother 80   Bladder Cancer Mother 30   Heart Problems Father    Dementia Paternal Aunt    Ovarian cancer Maternal Grandmother    Lung cancer Maternal Grandfather    Tuberculosis Paternal Grandmother      Current Outpatient Medications:    albuterol (PROAIR HFA) 108 (90 Base) MCG/ACT inhaler, Inhale 2 puffs into the lungs every 4 (four) hours as needed for wheezing or shortness of breath., Disp: 1 Inhaler, Rfl: 1   baclofen (LIORESAL) 10 MG tablet, Take by mouth., Disp: , Rfl:    butalbital-acetaminophen-caffeine (FIORICET) 50-325-40 MG tablet, Take 1 tablet by mouth every 4 (four) hours as needed., Disp: , Rfl:    diphenhydrAMINE (BENADRYL) 25 mg capsule, Take 25 mg by mouth every 6 (six) hours as needed for allergies. , Disp: , Rfl:    famotidine (PEPCID) 20 MG tablet, Take 1 tablet (20 mg total) by mouth 2 (two) times daily., Disp: 60 tablet, Rfl: 1   fluticasone (FLONASE) 50 MCG/ACT nasal spray, Place 1 spray  into both nostrils in the morning., Disp: , Rfl:    Insulin Pen Needle (PEN NEEDLES) 32G X 6 MM MISC, 1 each by Does not apply route once a week., Disp: 30 each, Rfl: 1   levothyroxine (SYNTHROID) 100 MCG tablet, TAKE 1 TABLET BY MOUTH BEFORE BREAKFAST, Disp: 90 tablet, Rfl: 0   losartan (COZAAR) 25 MG tablet, Take 1 tablet (25 mg total) by mouth daily. 1/2 tab daily, Disp: 45 tablet, Rfl: 3   meclizine (ANTIVERT) 25 MG tablet, Take 12.5-25 mg by mouth 2 (two) times daily as needed for dizziness (vertigo)., Disp: , Rfl:    methocarbamol (ROBAXIN) 750 MG tablet, Take 500 mg by mouth every 6 (six) hours as needed for muscle spasms. 1 at bedtime, Disp: , Rfl:    morphine (MSIR) 15 MG tablet, Take 15 mg by  mouth 4 (four) times daily as needed for severe pain. , Disp: , Rfl:    Multiple Vitamins-Minerals (MAXIMUM DAILY GREEN PO), Take 1 Package by mouth 3 (three) times a week. It Works! Greens, Disp: , Rfl:    promethazine (PHENERGAN) 25 MG tablet, TAKE 1/2 TO 1 (ONE-HALF TO ONE) TABLET BY MOUTH EVERY 8 HOURS AS NEEDED FOR NAUSEA, Disp: 40 tablet, Rfl: 0   RESTASIS 0.05 % ophthalmic emulsion, Place 1 drop into both eyes 2 (two) times daily., Disp: , Rfl:    Semaglutide,0.25 or 0.5MG /DOS, (OZEMPIC, 0.25 OR 0.5 MG/DOSE,) 2 MG/1.5ML SOPN, Inject 0.25 mg into the skin once a week., Disp: 1.5 mL, Rfl: 3   SUDOGEST MAXIMUM STRENGTH 30 MG tablet, TAKE 1 TABLET BY MOUTH TWICE DAILY AS NEEDED FOR CONGESTION, Disp: 90 tablet, Rfl: 0   traMADol (ULTRAM) 50 MG tablet, TAKE 1 TABLET BY MOUTH EVERY 6 HOURS AS NEEDED, Disp: 30 tablet, Rfl: 0   Vitamin D, Ergocalciferol, (DRISDOL) 1.25 MG (50000 UNIT) CAPS capsule, Take 1 capsule (50,000 Units total) by mouth every 7 (seven) days., Disp: 12 capsule, Rfl: 0   azelastine (ASTELIN) 0.1 % nasal spray, Place 2 sprays into both nostrils 2 (two) times daily. Use in each nostril as directed, Disp: 30 mL, Rfl: 2   Allergies  Allergen Reactions   Doxycycline Anaphylaxis, Diarrhea and Nausea And Vomiting    headache   Oxycodone-Acetaminophen Hives and Nausea And Vomiting   Prochlorperazine Edisylate Other (See Comments)    Hyper and jitteriness   Amoxicillin Rash and Other (See Comments)    Has patient had a PCN reaction causing immediate rash, facial/tongue/throat swelling, SOB or lightheadedness with hypotension: Unknown Has patient had a PCN reaction causing severe rash involving mucus membranes or skin necrosis: Unknown Has patient had a PCN reaction that required hospitalization: No Has patient had a PCN reaction occurring within the last 10 years: Yes If all of the above answers are "NO", then may proceed with Cephalosporin use.    Compazine Other (See Comments)     Hyper/jitters/blotches   Eszopiclone Other (See Comments)    Metallic taste and became un effective    Keppra [Levetiracetam] Other (See Comments)    hyperactive   Latex    Levetiracetam Other (See Comments)    Jitters/hyper   Neurontin [Gabapentin] Nausea And Vomiting   Propoxyphene Hives   Tape     PAPER TAPE-skin burns/severe irritation  PATIENT DOES NOT TOLERATE PAPER TAPE   Trileptal [Oxcarbazepine] Other (See Comments)    REACTION: immune system suppressed   Bactrim [Sulfamethoxazole-Trimethoprim] Rash   Bupropion Rash   Codeine Rash   Hydrocodone-Acetaminophen Rash  Moxifloxacin Swelling and Rash   Propoxyphene N-Acetaminophen Nausea And Vomiting and Rash   Vicodin [Hydrocodone-Acetaminophen] Rash     Review of Systems  Constitutional: Negative.   HENT:  Positive for ear pain. Negative for ear discharge.   Respiratory:  Positive for cough.   Cardiovascular: Negative.   Neurological:  Negative for dizziness and headaches.  Psychiatric/Behavioral: Negative.      Today's Vitals   04/20/21 1527  BP: 122/80  Pulse: 94  Temp: 98.4 F (36.9 C)  Weight: (!) 377 lb 6.4 oz (171.2 kg)  Height: 5' 3.6" (1.615 m)   Body mass index is 65.6 kg/m.   Objective:  Physical Exam Vitals reviewed.  Constitutional:      General: She is not in acute distress.    Appearance: Normal appearance. She is obese.  Cardiovascular:     Rate and Rhythm: Normal rate and regular rhythm.     Pulses: Normal pulses.     Heart sounds: Normal heart sounds. No murmur heard. Pulmonary:     Effort: Pulmonary effort is normal.  Neurological:     Mental Status: She is alert.        Assessment And Plan:     1. Otalgia of both ears Comments: She has had persistent ear pain and treated for ear infections and sinus infections over time. Will also have her to use azestaline for her sinus issues - Ambulatory referral to ENT - azelastine (ASTELIN) 0.1 % nasal spray; Place 2 sprays into both  nostrils 2 (two) times daily. Use in each nostril as directed  Dispense: 30 mL; Refill: 2  2. Uncontrolled type 2 diabetes mellitus with hyperglycemia (HCC) - Insulin Pen Needle (PEN NEEDLES) 32G X 6 MM MISC; 1 each by Does not apply route once a week.  Dispense: 30 each; Refill: 1  3. Chest tightness Comments: has used albuterol nebs in the past      Patient was given opportunity to ask questions. Patient verbalized understanding of the plan and was able to repeat key elements of the plan. All questions were answered to their satisfaction.  [name] Brine, FNP   I, [name] Brine, FNP, have reviewed all documentation for this visit. The documentation on 04/20/21 for the exam, diagnosis, procedures, and orders are all accurate and complete.   IF YOU HAVE BEEN REFERRED TO A SPECIALIST, IT MAY TAKE 1-2 WEEKS TO SCHEDULE/PROCESS THE REFERRAL. IF YOU HAVE NOT HEARD FROM US/SPECIALIST IN TWO WEEKS, PLEASE GIVE [place] A CALL AT [phone].   THE PATIENT IS ENCOURAGED TO PRACTICE SOCIAL DISTANCING DUE TO THE COVID-19 PANDEMIC.

## 2021-05-01 DIAGNOSIS — M25522 Pain in left elbow: Secondary | ICD-10-CM | POA: Diagnosis not present

## 2021-05-08 ENCOUNTER — Other Ambulatory Visit: Payer: Self-pay | Admitting: Internal Medicine

## 2021-05-08 ENCOUNTER — Other Ambulatory Visit: Payer: Self-pay

## 2021-05-08 MED ORDER — ALBUTEROL SULFATE 0.63 MG/3ML IN NEBU: 1.0000 | INHALATION_SOLUTION | Freq: Four times a day (QID) | RESPIRATORY_TRACT | 1 refills | Status: DC | PRN

## 2021-05-15 ENCOUNTER — Ambulatory Visit: Payer: Medicare Other | Admitting: Internal Medicine

## 2021-05-15 ENCOUNTER — Other Ambulatory Visit: Payer: Self-pay

## 2021-05-15 ENCOUNTER — Encounter: Payer: Self-pay | Admitting: Internal Medicine

## 2021-05-15 ENCOUNTER — Telehealth (INDEPENDENT_AMBULATORY_CARE_PROVIDER_SITE_OTHER): Payer: Medicare Other | Admitting: Internal Medicine

## 2021-05-15 VITALS — Ht 63.6 in | Wt 368.0 lb

## 2021-05-15 DIAGNOSIS — R0981 Nasal congestion: Secondary | ICD-10-CM

## 2021-05-15 DIAGNOSIS — J069 Acute upper respiratory infection, unspecified: Secondary | ICD-10-CM | POA: Diagnosis not present

## 2021-05-15 NOTE — Progress Notes (Signed)
Virtual Visit via Video which failed and changed to Phone visit   This visit type was conducted due to national recommendations for restrictions regarding the COVID-19 Pandemic (e.g. social distancing) in an effort to limit this patient's exposure and mitigate transmission in our community.  Due to her co-morbid illnesses, this patient is at least at moderate risk for complications without adequate follow up.  This format is felt to be most appropriate for this patient at this time.  All issues noted in this document were discussed and addressed.  A limited physical exam was performed with this format.    This visit type was conducted due to national recommendations for restrictions regarding the COVID-19 Pandemic (e.g. social distancing) in an effort to limit this patient's exposure and mitigate transmission in our community.  Patients identity confirmed using two different identifiers.  This format is felt to be most appropriate for this patient at this time.  All issues noted in this document were discussed and addressed.  No physical exam was performed (except for noted visual exam findings with Video Visits).    Date:  05/18/2021   ID:  [name] Wright, DOB 02/25/1966, MRN [id number]  Patient Location:  Home  Provider location:   Office  Chief Complaint:  "I think I have pneumonia"  History of Present Illness:    [name] Wright is a 56 y.o. female who presents via video conferencing for a telehealth visit today.    The patient does have symptoms concerning for COVID-19 infection (fever, chills, cough, or new shortness of breath).   She presents today for virtual visit. She prefers this method of contact due to COVID-19 pandemic.  She presents for further evaluation of cough and shortness of breath. She states cough is productive of yellowish phlegm.  Other sx include headaches, sinus congestion and body aches. She denies fever/chills. She states her sx started about five days  ago. She denies ill contacts. She states home COVID tests have been negative.       Past Medical History:  Diagnosis Date   Acute cystitis    Allergic rhinitis    Childhood asthma    no problems as adult - no inhaler   Chronic insomnia    Chronic pain due to trauma 02/2001   closed head trauma - Followed by [facility]   Complication of anesthesia    Dyspepsia    Family history of ovarian cancer    Head trauma 02/24/2001   closed   History of kidney stones    passed stone - no surgery required   Hypothyroidism    PONV (postoperative nausea and vomiting)    Pre-diabetes    SVD (spontaneous vaginal delivery)    fetal demise at 5 months   Past Surgical History:  Procedure Laterality Date   APPENDECTOMY     BIOPSY  08/25/2020   Procedure: BIOPSY;  Surgeon: Lavena Bullion, DO;  Location: [facility];  Service: Gastroenterology;;   COLONOSCOPY WITH PROPOFOL N/A 08/25/2020   Procedure: COLONOSCOPY WITH PROPOFOL;  Surgeon: Lavena Bullion, DO;  Location: [facility];  Service: Gastroenterology;  Laterality: N/A;   DILATATION & CURETTAGE/HYSTEROSCOPY WITH MYOSURE N/A 04/15/2017   Procedure: DILATATION & CURETTAGE/HYSTEROSCOPY WITH MYOSURE POLYPECTOMY;  Surgeon: Everlene Farrier, MD;  Location: [facility];  Service: Gynecology;  Laterality: N/A;   DILATATION & CURETTAGE/HYSTEROSCOPY WITH MYOSURE N/A 06/18/2019   Procedure: DILATATION & CURETTAGE/HYSTEROSCOPY WITH  MYOSURE;  Surgeon: Everlene Farrier, MD;  Location:  [facility];  Service: Gynecology;  Laterality: N/A;   ESOPHAGOGASTRODUODENOSCOPY (EGD) WITH PROPOFOL N/A 08/25/2020   Procedure: ESOPHAGOGASTRODUODENOSCOPY (EGD) WITH PROPOFOL;  Surgeon: Lavena Bullion, DO;  Location: [facility];  Service: Gastroenterology;  Laterality: N/A;   EYE SURGERY     cataracts removed   INCONTINENCE SURGERY     PILONIDAL CYST EXCISION     x2   POLYPECTOMY  08/25/2020   Procedure: POLYPECTOMY;  Surgeon: Lavena Bullion, DO;  Location: [facility];  Service: Gastroenterology;;   repair of toe laceration     dermabond to right big toe right foot   repair of torn ligament right leg     patient denies this surgery   right foot fracture     cast only   TONSILLECTOMY     WISDOM TOOTH EXTRACTION       Current Meds  Medication Sig   albuterol (ACCUNEB) 0.63 MG/3ML nebulizer solution Take 3 mLs (0.63 mg total) by nebulization every 6 (six) hours as needed for wheezing.   albuterol (PROAIR HFA) 108 (90 Base) MCG/ACT inhaler Inhale 2 puffs into the lungs every 4 (four) hours as needed for wheezing or shortness of breath.   azelastine (ASTELIN) 0.1 % nasal spray Place 2 sprays into both nostrils 2 (two) times daily. Use in each nostril as directed   baclofen (LIORESAL) 10 MG tablet Take by mouth.   butalbital-acetaminophen-caffeine (FIORICET) 50-325-40 MG tablet Take 1 tablet by mouth every 4 (four) hours as needed.   diphenhydrAMINE (BENADRYL) 25 mg capsule Take 25 mg by mouth every 6 (six) hours as needed for allergies.    famotidine (PEPCID) 20 MG tablet Take 1 tablet (20 mg total) by mouth 2 (two) times daily.   fluticasone (FLONASE) 50 MCG/ACT nasal spray Place 1 spray into both nostrils in the morning.   Insulin Pen Needle (PEN NEEDLES) 32G X 6 MM MISC 1 each by Does not apply route once a week.   levothyroxine (SYNTHROID) 100 MCG tablet TAKE 1 TABLET BY MOUTH BEFORE BREAKFAST   losartan (COZAAR) 25 MG tablet Take 1 tablet (25 mg total) by mouth daily. 1/2 tab daily   meclizine (ANTIVERT) 25 MG tablet Take 12.5-25 mg by mouth 2 (two) times daily as needed for dizziness (vertigo).   methocarbamol (ROBAXIN) 750 MG tablet Take 500 mg by mouth every 6 (six) hours as needed for muscle spasms. 1 at bedtime   morphine (MSIR) 15 MG tablet Take 15 mg by mouth 4 (four) times daily as needed for severe pain.    Multiple Vitamins-Minerals (MAXIMUM DAILY GREEN PO) Take 1 Package by mouth 3 (three) times a week. It  Works! Greens   promethazine (PHENERGAN) 25 MG tablet TAKE 1/2 TO 1 (ONE-HALF TO ONE) TABLET BY MOUTH EVERY 8 HOURS AS NEEDED FOR NAUSEA   RESTASIS 0.05 % ophthalmic emulsion Place 1 drop into both eyes 2 (two) times daily.   Semaglutide,0.25 or 0.5MG /DOS, (OZEMPIC, 0.25 OR 0.5 MG/DOSE,) 2 MG/1.5ML SOPN Inject 0.25 mg into the skin once a week.   SUDOGEST MAXIMUM STRENGTH 30 MG tablet TAKE 1 TABLET BY MOUTH TWICE DAILY AS NEEDED FOR CONGESTION   traMADol (ULTRAM) 50 MG tablet TAKE 1 TABLET BY MOUTH EVERY 6 HOURS AS NEEDED   Vitamin D, Ergocalciferol, (DRISDOL) 1.25 MG (50000 UNIT) CAPS capsule Take 1 capsule (50,000 Units total) by mouth every 7 (seven) days.     Allergies:   Doxycycline, Oxycodone-acetaminophen, Prochlorperazine edisylate, Amoxicillin, Compazine, Eszopiclone, Keppra [levetiracetam], Latex, Levetiracetam, Neurontin [  gabapentin], Propoxyphene, Tape, Trileptal [oxcarbazepine], Bactrim [sulfamethoxazole-trimethoprim], Bupropion, Codeine, Hydrocodone-acetaminophen, Moxifloxacin, Propoxyphene n-acetaminophen, and Vicodin [hydrocodone-acetaminophen]   Social History   Tobacco Use   Smoking status: Never   Smokeless tobacco: Never  Vaping Use   Vaping Use: Never used  Substance Use Topics   Alcohol use: No    Comment: "1 drink/year"   Drug use: Yes    Types: Morphine    Comment: Morphine 15 mg tab 4xdaily prn     Family Hx: The patient's family history includes Bladder Cancer (age of onset: 64) in her mother; Dementia in her paternal aunt; Heart Problems in her father; Lung cancer in her maternal grandfather; Multiple sclerosis (age of onset: 12) in her mother; Ovarian cancer in her maternal grandmother; Tuberculosis in her paternal grandmother.  ROS:   Please see the history of present illness.    Review of Systems  Constitutional: Negative.   HENT:  Positive for congestion.   Respiratory:  Positive for cough.   Cardiovascular: Negative.   Gastrointestinal: Negative.    Neurological: Negative.   Psychiatric/Behavioral: Negative.     All other systems reviewed and are negative.   Labs/Other Tests and Data Reviewed:    Recent Labs: 02/07/2021: ALT 26; BUN 11; Creatinine, Ser 0.70; Hemoglobin 14.3; Platelets 312; Potassium 4.3; Sodium 137; TSH 1.190   Recent Lipid Panel Lab Results  Component Value Date/Time   CHOL 178 10/05/2020 11:22 AM   TRIG 113 10/05/2020 11:22 AM   HDL 49 10/05/2020 11:22 AM   CHOLHDL 3.6 10/05/2020 11:22 AM   CHOLHDL 4 09/01/2015 10:47 AM   LDLCALC 109 (H) 10/05/2020 11:22 AM    Wt Readings from Last 3 Encounters:  05/15/21 (!) 368 lb (166.9 kg)  04/20/21 (!) 377 lb 6.4 oz (171.2 kg)  02/07/21 (!) 374 lb 9.6 oz (169.9 kg)     Exam:    Vital Signs:  Ht 5' 3.6" (1.615 m)    Wt (!) 368 lb (166.9 kg)    LMP 04/19/2021    BMI 63.96 kg/m     Physical Exam Vitals and nursing note reviewed.  Constitutional:      Appearance: Normal appearance.  HENT:     Head: Normocephalic and atraumatic.  Eyes:     Extraocular Movements: Extraocular movements intact.  Pulmonary:     Effort: Pulmonary effort is normal.     Comments: Some coughing during exam/visit Musculoskeletal:     Cervical back: Normal range of motion.  Neurological:     Mental Status: She is alert and oriented to person, place, and time.  Psychiatric:        Mood and Affect: Affect normal.    ASSESSMENT & PLAN:    1. Upper respiratory tract infection, unspecified type Comments: Advised to schedule PCR testing at [facility], likely viral. No abx needed at this time. If sx persist, will consider CXR.  2. Sinus congestion Comments: Again, advised to schedule PCR testing and to avoid dairy. May benefit from [facility] AD   COVID-19 Education: The signs and symptoms of COVID-19 were discussed with the patient and how to seek care for testing (follow up with PCP or arrange E-visit).  The importance of social distancing was discussed today.  Patient Risk:   After full  review of this patients clinical status, I feel that they are at least moderate risk at this time.  Time:   Today, I have spent 13 minutes/ seconds with the patient with telehealth technology discussing above diagnoses.  Medication Adjustments/Labs and Tests Ordered: Current medicines are reviewed at length with the patient today.  Concerns regarding medicines are outlined above.   Tests Ordered: No orders of the defined types were placed in this encounter.   Medication Changes: No orders of the defined types were placed in this encounter.   Disposition:  Follow up prn  Signed, Maximino Greenland, MD

## 2021-05-16 ENCOUNTER — Other Ambulatory Visit: Payer: Self-pay

## 2021-05-16 ENCOUNTER — Other Ambulatory Visit: Payer: Medicare Other

## 2021-05-16 DIAGNOSIS — R0981 Nasal congestion: Secondary | ICD-10-CM | POA: Diagnosis not present

## 2021-05-17 ENCOUNTER — Other Ambulatory Visit: Payer: Self-pay | Admitting: Internal Medicine

## 2021-05-17 ENCOUNTER — Ambulatory Visit: Payer: Medicare Other | Admitting: Internal Medicine

## 2021-05-17 ENCOUNTER — Encounter: Payer: Self-pay | Admitting: Internal Medicine

## 2021-05-17 DIAGNOSIS — R051 Acute cough: Secondary | ICD-10-CM

## 2021-05-17 LAB — SARS-COV-2, NAA 2 DAY TAT

## 2021-05-17 LAB — NOVEL CORONAVIRUS, NAA: SARS-CoV-2, NAA: NOT DETECTED

## 2021-05-18 ENCOUNTER — Other Ambulatory Visit: Payer: Self-pay

## 2021-05-18 ENCOUNTER — Ambulatory Visit
Admission: RE | Admit: 2021-05-18 | Discharge: 2021-05-18 | Disposition: A | Discharge status: Home / Self Care | Payer: Medicare Other | Source: Ambulatory Visit | Attending: Internal Medicine | Admitting: Internal Medicine

## 2021-05-18 DIAGNOSIS — R0602 Shortness of breath: Secondary | ICD-10-CM | POA: Diagnosis not present

## 2021-05-18 DIAGNOSIS — R059 Cough, unspecified: Secondary | ICD-10-CM | POA: Diagnosis not present

## 2021-05-18 DIAGNOSIS — R051 Acute cough: Secondary | ICD-10-CM

## 2021-05-21 ENCOUNTER — Encounter: Payer: Self-pay | Admitting: Internal Medicine

## 2021-05-21 DIAGNOSIS — R059 Cough, unspecified: Secondary | ICD-10-CM | POA: Diagnosis not present

## 2021-05-22 ENCOUNTER — Other Ambulatory Visit: Payer: Self-pay | Admitting: Internal Medicine

## 2021-05-22 MED ORDER — PREDNISONE 10 MG (21) PO TBPK: ORAL_TABLET | ORAL | 0 refills | Status: DC

## 2021-05-24 ENCOUNTER — Encounter: Payer: Self-pay | Admitting: Internal Medicine

## 2021-05-24 ENCOUNTER — Other Ambulatory Visit: Payer: Self-pay | Admitting: Internal Medicine

## 2021-05-24 DIAGNOSIS — R059 Cough, unspecified: Secondary | ICD-10-CM

## 2021-05-24 MED ORDER — ALBUTEROL SULFATE HFA 108 (90 BASE) MCG/ACT IN AERS: 2.0000 | INHALATION_SPRAY | RESPIRATORY_TRACT | 1 refills | Status: DC | PRN

## 2021-05-29 ENCOUNTER — Encounter: Payer: Self-pay | Admitting: Internal Medicine

## 2021-05-30 ENCOUNTER — Other Ambulatory Visit: Payer: Self-pay | Admitting: Internal Medicine

## 2021-06-02 DIAGNOSIS — J301 Allergic rhinitis due to pollen: Secondary | ICD-10-CM | POA: Diagnosis not present

## 2021-06-02 DIAGNOSIS — J3089 Other allergic rhinitis: Secondary | ICD-10-CM | POA: Diagnosis not present

## 2021-06-02 DIAGNOSIS — J3081 Allergic rhinitis due to animal (cat) (dog) hair and dander: Secondary | ICD-10-CM | POA: Diagnosis not present

## 2021-06-02 DIAGNOSIS — J4530 Mild persistent asthma, uncomplicated: Secondary | ICD-10-CM | POA: Diagnosis not present

## 2021-06-02 DIAGNOSIS — H1045 Other chronic allergic conjunctivitis: Secondary | ICD-10-CM | POA: Diagnosis not present

## 2021-06-07 ENCOUNTER — Other Ambulatory Visit: Payer: Self-pay

## 2021-06-07 ENCOUNTER — Ambulatory Visit (INDEPENDENT_AMBULATORY_CARE_PROVIDER_SITE_OTHER): Payer: Medicare Other | Admitting: Internal Medicine

## 2021-06-07 ENCOUNTER — Encounter: Payer: Self-pay | Admitting: Internal Medicine

## 2021-06-07 VITALS — BP 120/96 | HR 88 | Temp 98.9°F | Ht 63.6 in | Wt 379.2 lb

## 2021-06-07 DIAGNOSIS — R600 Localized edema: Secondary | ICD-10-CM

## 2021-06-07 DIAGNOSIS — Z6841 Body Mass Index (BMI) 40.0 and over, adult: Secondary | ICD-10-CM

## 2021-06-07 DIAGNOSIS — Z2821 Immunization not carried out because of patient refusal: Secondary | ICD-10-CM | POA: Diagnosis not present

## 2021-06-07 DIAGNOSIS — E538 Deficiency of other specified B group vitamins: Secondary | ICD-10-CM | POA: Diagnosis not present

## 2021-06-07 DIAGNOSIS — I10 Essential (primary) hypertension: Secondary | ICD-10-CM

## 2021-06-07 DIAGNOSIS — E1165 Type 2 diabetes mellitus with hyperglycemia: Secondary | ICD-10-CM | POA: Diagnosis not present

## 2021-06-07 DIAGNOSIS — Z8709 Personal history of other diseases of the respiratory system: Secondary | ICD-10-CM

## 2021-06-07 MED ORDER — LOSARTAN POTASSIUM 25 MG PO TABS
25.0000 mg | ORAL_TABLET | Freq: Every day | ORAL | 3 refills | Status: DC
Start: 2021-06-07 — End: 2022-02-27

## 2021-06-07 MED ORDER — CYANOCOBALAMIN 1000 MCG/ML IJ SOLN
1000.0000 ug | Freq: Once | INTRAMUSCULAR | Status: AC
  Administered 2021-06-07: 1000 ug via INTRAMUSCULAR

## 2021-06-07 NOTE — Patient Instructions (Signed)
Vitamin B12 Deficiency ?Vitamin B12 deficiency occurs when the body does not have enough vitamin B12, which is an important vitamin. The body needs this vitamin: ?To make red blood cells. ?To make DNA. This is the genetic material inside cells. ?To help the nerves work properly so they can carry messages from the brain to the body. ?Vitamin B12 deficiency can cause various health problems, such as a low red blood cell count (anemia) or nerve damage. ?What are the causes? ?This condition may be caused by: ?Not eating enough foods that contain vitamin B12. ?Not having enough stomach acid and digestive fluids to properly absorb vitamin B12 from the food that you eat. ?Certain digestive system diseases that make it hard to absorb vitamin B12. These diseases include Crohn's disease, chronic pancreatitis, and cystic fibrosis. ?A condition in which the body does not make enough of a protein (intrinsic factor), resulting in too few red blood cells (pernicious anemia). ?Having a surgery in which part of the stomach or small intestine is removed. ?Taking certain medicines that make it hard for the body to absorb vitamin B12. These medicines include: ?Heartburn medicines (antacids and proton pump inhibitors). ?Certain antibiotic medicines. ?Some medicines that are used to treat diabetes, tuberculosis, gout, or high cholesterol. ?What increases the risk? ?The following factors may make you more likely to develop a B12 deficiency: ?Being older than age 50. ?Eating a vegetarian or vegan diet, especially while you are pregnant. ?Eating a poor diet while you are pregnant. ?Taking certain medicines. ?Having alcoholism. ?What are the signs or symptoms? ?In some cases, there are no symptoms of this condition. If the condition leads to anemia or nerve damage, various symptoms can occur, such as: ?Weakness. ?Fatigue. ?Loss of appetite. ?Weight loss. ?Numbness or tingling in your hands and feet. ?Redness and burning of the  tongue. ?Confusion or memory problems. ?Depression. ?Sensory problems, such as color blindness, ringing in the ears, or loss of taste. ?Diarrhea or constipation. ?Trouble walking. ?If anemia is severe, symptoms can include: ?Shortness of breath. ?Dizziness. ?Rapid heart rate (tachycardia). ?How is this diagnosed? ?This condition may be diagnosed with a blood test to measure the level of vitamin B12 in your blood. You may also have other tests, including: ?A group of tests that measure certain characteristics of blood cells (complete blood count, CBC). ?A blood test to measure intrinsic factor. ?A procedure where a thin tube with a camera on the end is used to look into your stomach or intestines (endoscopy). ?Other tests may be needed to discover the cause of B12 deficiency. ?How is this treated? ?Treatment for this condition depends on the cause. This condition may be treated by: ?Changing your eating and drinking habits, such as: ?Eating more foods that contain vitamin B12. ?Drinking less alcohol or no alcohol. ?Getting vitamin B12 injections. ?Taking vitamin B12 supplements. Your health care provider will tell you which dosage is best for you. ?Follow these instructions at home: ?Eating and drinking ? ?Eat lots of healthy foods that contain vitamin B12, including: ?Meats and poultry. This includes beef, pork, chicken, turkey, and organ meats, such as liver. ?Seafood. This includes clams, rainbow trout, salmon, tuna, and haddock. ?Eggs. ?Cereal and dairy products that are fortified. This means that vitamin B12 has been added to the food. Check the label on the package to see if the food is fortified. ?The items listed above may not be a complete list of recommended foods and beverages. Contact a dietitian for more information. ?General instructions ?Get any   injections that are prescribed by your health care provider. ?Take supplements only as told by your health care provider. Follow the directions carefully. ?Do  not drink alcohol if your health care provider tells you not to. In some cases, you may only be asked to limit alcohol use. ?Keep all follow-up visits as told by your health care provider. This is important. ?Contact a health care provider if: ?Your symptoms come back. ?Get help right away if you: ?Develop shortness of breath. ?Have a rapid heart rate. ?Have chest pain. ?Become dizzy or lose consciousness. ?Summary ?Vitamin B12 deficiency occurs when the body does not have enough vitamin B12. ?The main causes of vitamin B12 deficiency include dietary deficiency, digestive diseases, pernicious anemia, and having a surgery in which part of the stomach or small intestine is removed. ?In some cases, there are no symptoms of this condition. If the condition leads to anemia or nerve damage, various symptoms can occur, such as weakness, shortness of breath, and numbness. ?Treatment may include getting vitamin B12 injections or taking vitamin B12 supplements. Eat lots of healthy foods that contain vitamin B12. ?This information is not intended to replace advice given to you by your health care provider. Make sure you discuss any questions you have with your health care provider. ?Document Revised: 09/21/2020 Document Reviewed: 12/03/2017 ?Elsevier Patient Education ? 2022 [employer] ? ?

## 2021-06-07 NOTE — Progress Notes (Signed)
I,Katawbba Wiggins,acting as a Education administrator for Maximino Greenland, MD.,have documented all relevant documentation on the behalf of Maximino Greenland, MD,as directed by  Maximino Greenland, MD while in the presence of Maximino Greenland, MD.  This visit occurred during the SARS-CoV-2 public health emergency.  Safety protocols were in place, including screening questions prior to the visit, additional usage of staff PPE, and extensive cleaning of exam room while observing appropriate contact time as indicated for disinfecting solutions.  Subjective:     Patient ID: [name] Wright , female    DOB: 08-26-1965 , 56 y.o.   MRN: [id number]   Chief Complaint  Patient presents with   Diabetes   Hypertension    HPI  The patient is here today for a diabetes and blood pressure f/u. She reports compliance with meds. Since her last visit, she has been evaluated by Allergy - she has now started immunotherapy.   Diabetes She presents for her follow-up diabetic visit. She has type 2 diabetes mellitus. There are no hypoglycemic associated symptoms. Pertinent negatives for diabetes include no blurred vision, no polydipsia, no polyphagia and no polyuria. There are no hypoglycemic complications. Risk factors for coronary artery disease include diabetes mellitus, dyslipidemia, hypertension, sedentary lifestyle and obesity. An ACE inhibitor/angiotensin II receptor blocker is being taken. Eye exam is current.  Hypertension This is a chronic problem. The current episode started more than 1 month ago. The problem is unchanged. Pertinent negatives include no blurred vision, neck pain or orthopnea. Compliance problems include exercise.     Past Medical History:  Diagnosis Date   Acute cystitis    Allergic rhinitis    Childhood asthma    no problems as adult - no inhaler   Chronic insomnia    Chronic pain due to trauma 02/2001   closed head trauma - Followed by Dr [facility]   Complication of  anesthesia    Dyspepsia    Family history of ovarian cancer    Head trauma 02/24/2001   closed   History of kidney stones    passed stone - no surgery required   Hypothyroidism    PONV (postoperative nausea and vomiting)    Pre-diabetes    SVD (spontaneous vaginal delivery)    fetal demise at 42 months     Family History  Problem Relation Age of Onset   Multiple sclerosis Mother 20   Bladder Cancer Mother 59   Heart Problems Father    Dementia Paternal Aunt    Ovarian cancer Maternal Grandmother    Lung cancer Maternal Grandfather    Tuberculosis Paternal Grandmother      Current Outpatient Medications:    albuterol (PROAIR HFA) 108 (90 Base) MCG/ACT inhaler, Inhale 2 puffs into the lungs every 4 (four) hours as needed for wheezing or shortness of breath., Disp: 1 each, Rfl: 1   azelastine (ASTELIN) 0.1 % nasal spray, Place 2 sprays into both nostrils 2 (two) times daily. Use in each nostril as directed, Disp: 30 mL, Rfl: 2   baclofen (LIORESAL) 10 MG tablet, Take by mouth., Disp: , Rfl:    butalbital-acetaminophen-caffeine (FIORICET) 50-325-40 MG tablet, Take 1 tablet by mouth every 4 (four) hours as needed., Disp: , Rfl:    diphenhydrAMINE (BENADRYL) 25 mg capsule, Take 25 mg by mouth every 6 (six) hours as needed for allergies. , Disp: , Rfl:    famotidine (PEPCID) 20 MG tablet, Take 1 tablet (20 mg total) by mouth 2 (  two) times daily., Disp: 60 tablet, Rfl: 1   fluticasone (FLONASE) 50 MCG/ACT nasal spray, Place 1 spray into both nostrils in the morning., Disp: , Rfl:    Insulin Pen Needle (PEN NEEDLES) 32G X 6 MM MISC, 1 each by Does not apply route once a week., Disp: 30 each, Rfl: 1   levothyroxine (SYNTHROID) 100 MCG tablet, TAKE 1 TABLET BY MOUTH BEFORE BREAKFAST, Disp: 90 tablet, Rfl: 0   meclizine (ANTIVERT) 25 MG tablet, Take 12.5-25 mg by mouth 2 (two) times daily as needed for dizziness (vertigo)., Disp: , Rfl:    methocarbamol (ROBAXIN) 750 MG tablet, Take 500 mg by  mouth every 6 (six) hours as needed for muscle spasms. 1 at bedtime, Disp: , Rfl:    morphine (MSIR) 15 MG tablet, Take 15 mg by mouth 4 (four) times daily as needed for severe pain. , Disp: , Rfl:    Multiple Vitamins-Minerals (MAXIMUM DAILY GREEN PO), Take 1 Package by mouth 3 (three) times a week. [employer]! Greens, Disp: , Rfl:    predniSONE (STERAPRED UNI-PAK 21 TAB) 10 MG (21) TBPK tablet, Use as directed, dispense as 6 day dose pack, Disp: 21 tablet, Rfl: 0   promethazine (PHENERGAN) 25 MG tablet, TAKE 1/2 TO 1 (ONE-HALF TO ONE) TABLET BY MOUTH EVERY 8 HOURS AS NEEDED FOR NAUSEA, Disp: 40 tablet, Rfl: 0   RESTASIS 0.05 % ophthalmic emulsion, Place 1 drop into both eyes 2 (two) times daily., Disp: , Rfl:    Semaglutide,0.25 or 0.5MG/DOS, (OZEMPIC, 0.25 OR 0.5 MG/DOSE,) 2 MG/1.5ML SOPN, Inject 0.25 mg into the skin once a week., Disp: 1.5 mL, Rfl: 3   SUDOGEST MAXIMUM STRENGTH 30 MG tablet, TAKE 1 TABLET BY MOUTH TWICE DAILY AS NEEDED FOR CONGESTION, Disp: 90 tablet, Rfl: 0   traMADol (ULTRAM) 50 MG tablet, TAKE 1 TABLET BY MOUTH EVERY 6 HOURS AS NEEDED, Disp: 30 tablet, Rfl: 0   Vitamin D, Ergocalciferol, (DRISDOL) 1.25 MG (50000 UNIT) CAPS capsule, Take 1 capsule (50,000 Units total) by mouth every 7 (seven) days., Disp: 12 capsule, Rfl: 0   losartan (COZAAR) 25 MG tablet, Take 1 tablet (25 mg total) by mouth daily., Disp: 90 tablet, Rfl: 3   Allergies  Allergen Reactions   Doxycycline Anaphylaxis, Diarrhea and Nausea And Vomiting    headache   Oxycodone-Acetaminophen Hives and Nausea And Vomiting   Prochlorperazine Edisylate Other (See Comments)    Hyper and jitteriness   Amoxicillin Rash and Other (See Comments)    Has patient had a PCN reaction causing immediate rash, facial/tongue/throat swelling, SOB or lightheadedness with hypotension: Unknown Has patient had a PCN reaction causing severe rash involving mucus membranes or skin necrosis: Unknown Has patient had a PCN reaction that  required hospitalization: No Has patient had a PCN reaction occurring within the last 10 years: Yes If all of the above answers are "NO", then may proceed with Cephalosporin use.    Compazine Other (See Comments)    Hyper/jitters/blotches   Eszopiclone Other (See Comments)    Metallic taste and became un effective    Keppra [Levetiracetam] Other (See Comments)    hyperactive   Latex    Levetiracetam Other (See Comments)    Jitters/hyper   Neurontin [Gabapentin] Nausea And Vomiting   Propoxyphene Hives   Tape     PAPER TAPE-skin burns/severe irritation  PATIENT DOES NOT TOLERATE PAPER TAPE   Trileptal [Oxcarbazepine] Other (See Comments)    REACTION: immune system suppressed   Bactrim [Sulfamethoxazole-Trimethoprim]  Rash   Bupropion Rash   Codeine Rash   Hydrocodone-Acetaminophen Rash   Moxifloxacin Swelling and Rash   Propoxyphene N-Acetaminophen Nausea And Vomiting and Rash   Vicodin [Hydrocodone-Acetaminophen] Rash     Review of Systems  Constitutional: Negative.   Eyes:  Negative for blurred vision.  Respiratory: Negative.    Cardiovascular:  Positive for leg swelling. Negative for orthopnea.  Gastrointestinal: Negative.   Endocrine: Negative for polydipsia, polyphagia and polyuria.  Musculoskeletal:  Negative for neck pain.  Neurological: Negative.   Psychiatric/Behavioral: Negative.    All other systems reviewed and are negative.   Today's Vitals   06/07/21 1109 06/07/21 1124  BP: (!) 124/96 (!) 120/96  Pulse: 88   Temp: 98.9 F (37.2 C)   Weight: (!) 379 lb 3.2 oz (172 kg)   Height: 5' 3.6" (1.615 m)    Body mass index is 65.91 kg/m.  Wt Readings from Last 3 Encounters:  06/07/21 (!) 379 lb 3.2 oz (172 kg)  05/15/21 (!) 368 lb (166.9 kg)  04/20/21 (!) 377 lb 6.4 oz (171.2 kg)    BP Readings from Last 3 Encounters:  06/07/21 (!) 120/96  04/20/21 122/80  02/07/21 122/70    Objective:  Physical Exam Vitals and nursing note reviewed.   Constitutional:      Appearance: Normal appearance. She is obese.  HENT:     Head: Normocephalic and atraumatic.     Nose:     Comments: Masked     Mouth/Throat:     Comments: Masked  Eyes:     Extraocular Movements: Extraocular movements intact.  Cardiovascular:     Rate and Rhythm: Normal rate and regular rhythm.     Heart sounds: Normal heart sounds.  Pulmonary:     Effort: Pulmonary effort is normal.     Breath sounds: Normal breath sounds.  Musculoskeletal:     Cervical back: Normal range of motion.     Right lower leg: Edema present.     Left lower leg: Edema present.  Skin:    General: Skin is warm.  Neurological:     General: No focal deficit present.     Mental Status: She is alert.  Psychiatric:        Mood and Affect: Mood normal.        Behavior: Behavior normal.     Assessment And Plan:     1. Uncontrolled type 2 diabetes mellitus with hyperglycemia (HCC) Comments: Chronic, I will check labs as below. I will adjust meds as needed. She is encouraged to follow dietary guidelines as discussed w/ previous visits.  - BMP8+EGFR - Hemoglobin A1c  2. Essential hypertension, benign Comments: Uncontrolled. I will increase Losartan to 67m, one full tablet daily. Again, encouraged to increase daily activity.  - losartan (COZAAR) 25 MG tablet; Take 1 tablet (25 mg total) by mouth daily.  Dispense: 90 tablet; Refill: 3  3. B12 deficiency - cyanocobalamin ((VITAMIN B-12)) injection 1,000 mcg - Vitamin B12  4. Lower extremity edema Comments: She is advised to wear compression hose and elevate both legs when seated.  - Brain natriuretic peptide ((15400  5. Class 3 severe obesity due to excess calories with serious comorbidity and body mass index (BMI) of 60.0 to 69.9 in adult ([facility] Comments: BMI 65. She was made aware of 11 lb weight gain since her last visit, likely partially related to recent steroid use. Again stressed importance of regular exerc  6. Varicella  zoster virus (VZV) vaccination declined  7. COVID-19 vaccination declined  8. History of frequent URI Comments: She is encouraged to change out her showerhead and check home for mold.   Patient was given opportunity to ask questions. Patient verbalized understanding of the plan and was able to repeat key elements of the plan. All questions were answered to their satisfaction.   I, Maximino Greenland, MD, have reviewed all documentation for this visit. The documentation on 06/11/21 for the exam, diagnosis, procedures, and orders are all accurate and complete.   IF YOU HAVE BEEN REFERRED TO A SPECIALIST, IT MAY TAKE 1-2 WEEKS TO SCHEDULE/PROCESS THE REFERRAL. IF YOU HAVE NOT HEARD FROM US/SPECIALIST IN TWO WEEKS, PLEASE GIVE [place] A CALL AT [phone] X [phone].   THE PATIENT IS ENCOURAGED TO PRACTICE SOCIAL DISTANCING DUE TO THE COVID-19 PANDEMIC.

## 2021-06-08 LAB — BRAIN NATRIURETIC PEPTIDE: BNP: 32 pg/mL (ref 0.0–100.0)

## 2021-06-08 LAB — BMP8+EGFR
BUN/Creatinine Ratio: 20 (ref 9–23)
BUN: 13 mg/dL (ref 6–24)
CO2: 26 mmol/L (ref 20–29)
Calcium: 8.9 mg/dL (ref 8.7–10.2)
Chloride: 102 mmol/L (ref 96–106)
Creatinine, Ser: 0.64 mg/dL (ref 0.57–1.00)
Glucose: 174 mg/dL — ABNORMAL HIGH (ref 70–99)
Potassium: 4.3 mmol/L (ref 3.5–5.2)
Sodium: 140 mmol/L (ref 134–144)
eGFR: 105 mL/min/{1.73_m2} (ref >59)

## 2021-06-08 LAB — HEMOGLOBIN A1C
Est. average glucose Bld gHb Est-mCnc: 171 mg/dL
Hgb A1c MFr Bld: 7.6 % — ABNORMAL HIGH (ref 4.8–5.6)

## 2021-06-08 LAB — VITAMIN B12: Vitamin B-12: 349 pg/mL (ref 232–1245)

## 2021-06-15 DIAGNOSIS — J3089 Other allergic rhinitis: Secondary | ICD-10-CM | POA: Diagnosis not present

## 2021-06-15 DIAGNOSIS — J301 Allergic rhinitis due to pollen: Secondary | ICD-10-CM | POA: Diagnosis not present

## 2021-06-15 DIAGNOSIS — J3081 Allergic rhinitis due to animal (cat) (dog) hair and dander: Secondary | ICD-10-CM | POA: Diagnosis not present

## 2021-06-18 DIAGNOSIS — R059 Cough, unspecified: Secondary | ICD-10-CM | POA: Diagnosis not present

## 2021-06-20 DIAGNOSIS — J3089 Other allergic rhinitis: Secondary | ICD-10-CM | POA: Diagnosis not present

## 2021-06-20 DIAGNOSIS — J341 Cyst and mucocele of nose and nasal sinus: Secondary | ICD-10-CM | POA: Diagnosis not present

## 2021-06-20 DIAGNOSIS — J329 Chronic sinusitis, unspecified: Secondary | ICD-10-CM | POA: Diagnosis not present

## 2021-06-28 ENCOUNTER — Other Ambulatory Visit: Payer: Self-pay | Admitting: Internal Medicine

## 2021-06-30 DIAGNOSIS — J3081 Allergic rhinitis due to animal (cat) (dog) hair and dander: Secondary | ICD-10-CM | POA: Diagnosis not present

## 2021-06-30 DIAGNOSIS — J3089 Other allergic rhinitis: Secondary | ICD-10-CM | POA: Diagnosis not present

## 2021-06-30 DIAGNOSIS — J019 Acute sinusitis, unspecified: Secondary | ICD-10-CM | POA: Diagnosis not present

## 2021-06-30 DIAGNOSIS — J301 Allergic rhinitis due to pollen: Secondary | ICD-10-CM | POA: Diagnosis not present

## 2021-06-30 DIAGNOSIS — H1045 Other chronic allergic conjunctivitis: Secondary | ICD-10-CM | POA: Diagnosis not present

## 2021-07-11 LAB — HM MAMMOGRAPHY: HM Mammogram: NORMAL (ref 0–4)

## 2021-07-12 LAB — HM PAP SMEAR

## 2021-07-18 ENCOUNTER — Encounter: Payer: Self-pay | Admitting: Internal Medicine

## 2021-08-07 ENCOUNTER — Encounter: Payer: Self-pay | Admitting: Internal Medicine

## 2021-08-07 ENCOUNTER — Other Ambulatory Visit: Payer: Self-pay | Admitting: Internal Medicine

## 2021-08-07 MED ORDER — OZEMPIC (0.25 OR 0.5 MG/DOSE) 2 MG/1.5ML ~~LOC~~ SOPN: 0.5000 mg | PEN_INJECTOR | SUBCUTANEOUS | 3 refills | Status: DC

## 2021-09-06 ENCOUNTER — Other Ambulatory Visit: Payer: Self-pay | Admitting: Internal Medicine

## 2021-09-18 ENCOUNTER — Ambulatory Visit: Payer: Medicare Other | Admitting: Internal Medicine

## 2021-09-22 ENCOUNTER — Encounter: Payer: Self-pay | Admitting: Internal Medicine

## 2021-09-26 ENCOUNTER — Other Ambulatory Visit: Payer: Medicare Other

## 2021-10-02 ENCOUNTER — Other Ambulatory Visit: Payer: Medicare Other

## 2021-10-02 DIAGNOSIS — E039 Hypothyroidism, unspecified: Secondary | ICD-10-CM

## 2021-10-03 LAB — TSH: TSH: 1.22 u[IU]/mL (ref 0.450–4.500)

## 2021-10-18 ENCOUNTER — Ambulatory Visit (INDEPENDENT_AMBULATORY_CARE_PROVIDER_SITE_OTHER): Payer: Medicare Other

## 2021-10-18 ENCOUNTER — Ambulatory Visit: Payer: Medicare Other | Admitting: Internal Medicine

## 2021-10-18 ENCOUNTER — Ambulatory Visit (INDEPENDENT_AMBULATORY_CARE_PROVIDER_SITE_OTHER): Payer: Medicare Other | Admitting: Nurse Practitioner

## 2021-10-18 ENCOUNTER — Encounter: Payer: Self-pay | Admitting: Nurse Practitioner

## 2021-10-18 VITALS — BP 150/90 | HR 96 | Temp 98.5°F | Ht 66.0 in | Wt 382.0 lb

## 2021-10-18 VITALS — BP 146/90 | HR 96 | Temp 98.5°F | Ht 66.0 in | Wt 382.4 lb

## 2021-10-18 DIAGNOSIS — R059 Cough, unspecified: Secondary | ICD-10-CM | POA: Diagnosis not present

## 2021-10-18 DIAGNOSIS — Z0000 Encounter for general adult medical examination without abnormal findings: Secondary | ICD-10-CM

## 2021-10-18 DIAGNOSIS — Z6841 Body Mass Index (BMI) 40.0 and over, adult: Secondary | ICD-10-CM

## 2021-10-18 DIAGNOSIS — K219 Gastro-esophageal reflux disease without esophagitis: Secondary | ICD-10-CM

## 2021-10-18 DIAGNOSIS — E538 Deficiency of other specified B group vitamins: Secondary | ICD-10-CM

## 2021-10-18 DIAGNOSIS — M7989 Other specified soft tissue disorders: Secondary | ICD-10-CM

## 2021-10-18 DIAGNOSIS — I10 Essential (primary) hypertension: Secondary | ICD-10-CM

## 2021-10-18 DIAGNOSIS — J40 Bronchitis, not specified as acute or chronic: Secondary | ICD-10-CM | POA: Diagnosis not present

## 2021-10-18 DIAGNOSIS — E1165 Type 2 diabetes mellitus with hyperglycemia: Secondary | ICD-10-CM

## 2021-10-18 DIAGNOSIS — R0600 Dyspnea, unspecified: Secondary | ICD-10-CM | POA: Diagnosis not present

## 2021-10-18 MED ORDER — FAMOTIDINE 20 MG PO TABS: 20.0000 mg | ORAL_TABLET | Freq: Two times a day (BID) | ORAL | 1 refills | Status: DC

## 2021-10-18 MED ORDER — FUROSEMIDE 80 MG PO TABS: 80.0000 mg | ORAL_TABLET | Freq: Every day | ORAL | 0 refills | Status: DC | PRN

## 2021-10-18 NOTE — Progress Notes (Signed)
Subjective:   [name] Wright is a 56 y.o. female who presents for Medicare Annual (Subsequent) preventive examination.  Review of Systems     Cardiac Risk Factors include: advanced age (>2mn, >>53women);diabetes mellitus;hypertension;obesity (BMI >30kg/m2)     Objective:    Today's Vitals   10/18/21 1440 10/18/21 1507  BP: (!) 150/90 (!) 146/90  Pulse: 96   Temp: 98.5 F (36.9 C)   TempSrc: Oral   SpO2: 98%   Weight: (!) 382 lb 6.4 oz (173.5 kg)   Height: '5\' 6"'$  (1.676 m)    Body mass index is 61.72 kg/m.     10/18/2021    2:54 PM 10/05/2020   10:21 AM 08/25/2020    7:25 AM 08/13/2019    9:23 AM 06/12/2019   10:35 AM 08/12/2018    9:20 AM 04/10/2017   12:19 PM  Advanced Directives  Does Patient Have a Medical Advance Directive? No No Yes No No No No  Type of Advance Directive   Living will      Would patient like information on creating a medical advance directive? No - Patient declined No - Patient declined  No - Patient declined No - Patient declined  Yes (MAU/Ambulatory/Procedural Areas - Information given)    Current Medications (verified) Outpatient Encounter Medications as of 10/18/2021  Medication Sig   albuterol (PROAIR HFA) 108 (90 Base) MCG/ACT inhaler Inhale 2 puffs into the lungs every 4 (four) hours as needed for wheezing or shortness of breath.   azelastine (ASTELIN) 0.1 % nasal spray Place 2 sprays into both nostrils 2 (two) times daily. Use in each nostril as directed   baclofen (LIORESAL) 10 MG tablet Take by mouth.   butalbital-acetaminophen-caffeine (FIORICET) 50-325-40 MG tablet Take 1 tablet by mouth every 4 (four) hours as needed.   diphenhydrAMINE (BENADRYL) 25 mg capsule Take 25 mg by mouth every 6 (six) hours as needed for allergies.    fluticasone (FLONASE) 50 MCG/ACT nasal spray Place 1 spray into both nostrils in the morning.   Insulin Pen Needle (PEN NEEDLES) 32G X 6 MM MISC 1 each by Does not apply route once a week.   levothyroxine  (SYNTHROID) 100 MCG tablet TAKE 1 TABLET BY MOUTH BEFORE BREAKFAST   Magnesium 400 MG TABS Take 1 tablet by mouth daily.   meclizine (ANTIVERT) 25 MG tablet Take 12.5-25 mg by mouth 2 (two) times daily as needed for dizziness (vertigo).   methocarbamol (ROBAXIN) 750 MG tablet Take 500 mg by mouth every 6 (six) hours as needed for muscle spasms. 1 at bedtime   morphine (MSIR) 15 MG tablet Take 15 mg by mouth 4 (four) times daily as needed for severe pain.    Multiple Vitamins-Minerals (MAXIMUM DAILY GREEN PO) Take 1 Package by mouth 3 (three) times a week. It Works! Greens   promethazine (PHENERGAN) 25 MG tablet TAKE 1/2 TO 1 (ONE-HALF TO ONE) TABLET BY MOUTH EVERY 8 HOURS AS NEEDED FOR NAUSEA   RESTASIS 0.05 % ophthalmic emulsion Place 1 drop into both eyes 2 (two) times daily.   Semaglutide,0.25 or 0.'5MG'$ /DOS, (OZEMPIC, 0.25 OR 0.5 MG/DOSE,) 2 MG/1.5ML SOPN Inject 0.5 mg into the skin once a week.   SUDOGEST MAXIMUM STRENGTH 30 MG tablet TAKE 1 TABLET BY MOUTH TWICE DAILY AS NEEDED FOR CONGESTION   traMADol (ULTRAM) 50 MG tablet TAKE 1 TABLET BY MOUTH EVERY 6 HOURS AS NEEDED   Vitamin D, Ergocalciferol, (DRISDOL) 1.25 MG (50000 UNIT) CAPS capsule Take 1 capsule (50,000  Units total) by mouth every 7 (seven) days.   famotidine (PEPCID) 20 MG tablet Take 1 tablet (20 mg total) by mouth 2 (two) times daily.   losartan (COZAAR) 25 MG tablet Take 1 tablet (25 mg total) by mouth daily.   predniSONE (STERAPRED UNI-PAK 21 TAB) 10 MG (21) TBPK tablet Use as directed, dispense as 6 day dose pack (Patient not taking: Reported on 10/18/2021)   No facility-administered encounter medications on file as of 10/18/2021.    Allergies (verified) Doxycycline, Oxycodone-acetaminophen, Prochlorperazine edisylate, Amoxicillin, Compazine, Eszopiclone, Keppra [levetiracetam], Latex, Levetiracetam, Neurontin [gabapentin], Propoxyphene, Tape, Trileptal [oxcarbazepine], Bactrim [sulfamethoxazole-trimethoprim], Bupropion,  Codeine, Hydrocodone-acetaminophen, Moxifloxacin, Propoxyphene n-acetaminophen, and Vicodin [hydrocodone-acetaminophen]   History: Past Medical History:  Diagnosis Date   Acute cystitis    Allergic rhinitis    Childhood asthma    no problems as adult - no inhaler   Chronic insomnia    Chronic pain due to trauma 02/2001   closed head trauma - Followed by Dr [facility]   Complication of anesthesia    Dyspepsia    Family history of ovarian cancer    Head trauma 02/24/2001   closed   History of kidney stones    passed stone - no surgery required   Hypothyroidism    PONV (postoperative nausea and vomiting)    Pre-diabetes    SVD (spontaneous vaginal delivery)    fetal demise at 5 months   Past Surgical History:  Procedure Laterality Date   APPENDECTOMY     BIOPSY  08/25/2020   Procedure: BIOPSY;  Surgeon: Lavena Bullion, DO;  Location: [facility];  Service: Gastroenterology;;   COLONOSCOPY WITH PROPOFOL N/A 08/25/2020   Procedure: COLONOSCOPY WITH PROPOFOL;  Surgeon: Lavena Bullion, DO;  Location: [facility];  Service: Gastroenterology;  Laterality: N/A;   DILATATION & CURETTAGE/HYSTEROSCOPY WITH MYOSURE N/A 04/15/2017   Procedure: DILATATION & CURETTAGE/HYSTEROSCOPY WITH MYOSURE POLYPECTOMY;  Surgeon: Everlene Farrier, MD;  Location: [facility];  Service: Gynecology;  Laterality: N/A;   DILATATION & CURETTAGE/HYSTEROSCOPY WITH MYOSURE N/A 06/18/2019   Procedure: DILATATION & CURETTAGE/HYSTEROSCOPY WITH  MYOSURE;  Surgeon: Everlene Farrier, MD;  Location: [place];  Service: Gynecology;  Laterality: N/A;   ESOPHAGOGASTRODUODENOSCOPY (EGD) WITH PROPOFOL N/A 08/25/2020   Procedure: ESOPHAGOGASTRODUODENOSCOPY (EGD) WITH PROPOFOL;  Surgeon: Lavena Bullion, DO;  Location: [facility];  Service: Gastroenterology;  Laterality: N/A;   EYE SURGERY     cataracts removed   INCONTINENCE SURGERY     PILONIDAL CYST EXCISION     x2   POLYPECTOMY  08/25/2020    Procedure: POLYPECTOMY;  Surgeon: Lavena Bullion, DO;  Location: [facility];  Service: Gastroenterology;;   repair of toe laceration     dermabond to right big toe right foot   repair of torn ligament right leg     patient denies this surgery   right foot fracture     cast only   TONSILLECTOMY     WISDOM TOOTH EXTRACTION     Family History  Problem Relation Age of Onset   Multiple sclerosis Mother 46   Bladder Cancer Mother 56   Heart Problems Father    Dementia Paternal Aunt    Ovarian cancer Maternal Grandmother    Lung cancer Maternal Grandfather    Tuberculosis Paternal Grandmother    Social History   Socioeconomic History   Marital status: Married    Spouse name: Not on file   Number of children: 0   Years of education:  Not on file   Highest education level: Not on file  Occupational History   Occupation: disability  Tobacco Use   Smoking status: Never   Smokeless tobacco: Never  Vaping Use   Vaping Use: Never used  Substance and Sexual Activity   Alcohol use: No    Comment: "1 drink/year"   Drug use: Yes    Types: Morphine    Comment: Morphine 15 mg tab 4xdaily prn   Sexual activity: Not Currently    Birth control/protection: None  Other Topics Concern   Not on file  Social History Narrative   [employer], BS, 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   Married- '94- 8 years divorced, Married '03   No children   Retired- disability from closed head injury with focus, cognition   Social Determinants of Health   Financial Resource Strain: Low Risk  (10/18/2021)   Overall Financial Resource Strain (CARDIA)    Difficulty of Paying Living Expenses: Not hard at all  Food Insecurity: No Food Insecurity (10/18/2021)   Hunger Vital Sign    Worried About Running Out of Food in the Last Year: Never true    Ran Out of Food in the Last Year: Never true  Transportation Needs: No Transportation Needs (10/18/2021)   PRAPARE - Hydrologist (Medical): No     Lack of Transportation (Non-Medical): No  Physical Activity: Inactive (10/18/2021)   Exercise Vital Sign    Days of Exercise per Week: 0 days    Minutes of Exercise per Session: 0 min  Stress: No Stress Concern Present (10/18/2021)   [place]    Feeling of Stress : Not at all  Social Connections: Not on file    Tobacco Counseling Counseling given: Not Answered   Clinical Intake:  Pre-visit preparation completed: Yes  Pain : No/denies pain     Nutritional Status: BMI > 30  Obese Nutritional Risks: None Diabetes: Yes  How often do you need to have someone help you when you read instructions, pamphlets, or other written materials from your doctor or pharmacy?: 1 - Never What is the last grade level you completed in school?: bachelors degree  Diabetic? Yes Nutrition Risk Assessment:  Has the patient had any N/V/D within the last 2 months?  No  Does the patient have any non-healing wounds?  No  Has the patient had any unintentional weight loss or weight gain?  Yes   Diabetes:  Is the patient diabetic?  Yes  If diabetic, was a CBG obtained today?  No  Did the patient bring in their glucometer from home?  No  How often do you monitor your CBG's? Does not.   Financial Strains and Diabetes Management:  Are you having any financial strains with the device, your supplies or your medication? Yes .  Does the patient want to be seen by Chronic Care Management for management of their diabetes?  Yes  Would the patient like to be referred to a Nutritionist or for Diabetic Management?  No   Diabetic Exams:  Diabetic Eye Exam: Completed 03/21/2021 Diabetic Foot Exam: Completed 02/07/2021   Interpreter Needed?: No  Information entered by :: NAllen LPN   Activities of Daily Living    10/18/2021    2:55 PM  In your present state of health, do you have any difficulty performing the following activities:   Hearing? 0  Vision? 0  Difficulty concentrating or making decisions? 1  Comment  had head injury  Walking or climbing stairs? 1  Dressing or bathing? 0  Doing errands, shopping? 0  Preparing Food and eating ? N  Using the Toilet? N  In the past six months, have you accidently leaked urine? Y  Do you have problems with loss of bowel control? N  Managing your Medications? N  Managing your Finances? N  Housekeeping or managing your Housekeeping? N    Patient Care Team: [name], MD as PCP - General (Internal Medicine) Biagio Borg, MD (Internal Medicine)  Indicate any recent Medical Services you may have received from other than [facility] providers in the past year (date may be approximate).     Assessment:   This is a routine wellness examination for [place].  Hearing/Vision screen Vision Screening - Comments:: Regular eye exams, [facility]  Dietary issues and exercise activities discussed: Current Exercise Habits: The patient does not participate in regular exercise at present   Goals Addressed             This Visit's Progress    Patient Stated       10/18/2021, wants to lose weight       Depression Screen    10/18/2021    2:55 PM 10/05/2020   10:23 AM 08/13/2019    9:24 AM 03/10/2019    1:31 PM 08/12/2018    9:22 AM 05/06/2018   10:20 AM 03/12/2018   10:23 AM  PHQ 2/9 Scores  PHQ - 2 Score 0 0 0 0 0 0 0  PHQ- 9 Score   0  0      Fall Risk    10/18/2021    2:54 PM 10/05/2020   10:22 AM 08/13/2019    9:24 AM 03/10/2019    1:31 PM 01/22/2019   10:37 AM  [place] in the past year? 0 1 0 0 0  Comment  slid on ice     Number falls in past yr: 0 0     Injury with Fall? 0 1     Comment  slight concussion     Risk for fall due to : Medication side effect Medication side effect Medication side effect    Follow up Falls evaluation completed;Education provided;Falls prevention discussed Falls evaluation completed;Education provided;Falls  prevention discussed Falls evaluation completed;Education provided;Falls prevention discussed      FALL RISK PREVENTION PERTAINING TO THE HOME:  Any stairs in or around the home? Yes  If so, are there any without handrails? No  Home free of loose throw rugs in walkways, pet beds, electrical cords, etc? Yes  Adequate lighting in your home to reduce risk of falls? Yes   ASSISTIVE DEVICES UTILIZED TO PREVENT FALLS:  Life alert? No  Use of a cane, walker or w/c? No  Grab bars in the bathroom? No  Shower chair or bench in shower? Yes  Elevated toilet seat or a handicapped toilet? No   TIMED UP AND GO:  Was the test performed? No .    Gait slow and steady without use of assistive device  Cognitive Function:        10/18/2021    2:57 PM 10/05/2020   10:24 AM 08/13/2019    9:27 AM 08/12/2018    9:24 AM  6CIT Screen  What Year? 0 points 0 points 0 points 0 points  What month? 0 points 0 points 0 points 0 points  What time? 0 points 0 points 0 points 0 points  Count back from 20 0 points 0 points 2 points 0 points  Months in reverse 0 points 0 points 0 points 0 points  Repeat phrase 2 points 4 points 0 points 0 points  Total Score 2 points 4 points 2 points 0 points    Immunizations Immunization History  Administered Date(s) Administered   Td 09/26/2001    TDAP status: Due, Education has been provided regarding the importance of this vaccine. Advised may receive this vaccine at local pharmacy or Health Dept. Aware to provide a copy of the vaccination record if obtained from local pharmacy or Health Dept. Verbalized acceptance and understanding.  Flu Vaccine status: Declined, Education has been provided regarding the importance of this vaccine but patient still declined. Advised may receive this vaccine at local pharmacy or Health Dept. Aware to provide a copy of the vaccination record if obtained from local pharmacy or Health Dept. Verbalized acceptance and  understanding.  Pneumococcal vaccine status: Up to date  Covid-19 vaccine status: Declined, Education has been provided regarding the importance of this vaccine but patient still declined. Advised may receive this vaccine at local pharmacy or Health Dept.or vaccine clinic. Aware to provide a copy of the vaccination record if obtained from local pharmacy or Health Dept. Verbalized acceptance and understanding.  Qualifies for Shingles Vaccine? Yes   Zostavax completed No   Shingrix Completed?: No.    Education has been provided regarding the importance of this vaccine. Patient has been advised to call insurance company to determine out of pocket expense if they have not yet received this vaccine. Advised may also receive vaccine at local pharmacy or Health Dept. Verbalized acceptance and understanding.  Screening Tests Health Maintenance  Topic Date Due   HIV Screening  Never done   MAMMOGRAM  Never done   COVID-19 Vaccine (1) 11/03/2021 (Originally 07/06/1966)   Zoster Vaccines- Shingrix (1 of 2) 01/18/2022 (Originally 01/05/1986)   TETANUS/TDAP  10/19/2022 (Originally 09/27/2011)   INFLUENZA VACCINE  11/07/2021   HEMOGLOBIN A1C  12/08/2021   FOOT EXAM  02/07/2022   OPHTHALMOLOGY EXAM  03/21/2022   PAP SMEAR-Modifier  08/12/2022   COLONOSCOPY (Pts 45-1yr Insurance coverage will need to be confirmed)  08/26/2030   Hepatitis C Screening  Completed   HPV VACCINES  Aged Out    Health Maintenance  Health Maintenance Due  Topic Date Due   HIV Screening  Never done   MAMMOGRAM  Never done    Colorectal cancer screening: Type of screening: Colonoscopy. Completed 08/25/2020. Repeat every 1 years  Mammogram status: Completed 07/2021. Repeat every year  Bone Density status: n/a  Lung Cancer Screening: (Low Dose CT Chest recommended if Age 55-80years, 30 pack-year currently smoking OR have quit w/in 15years.) does not qualify.   Lung Cancer Screening Referral: no  Additional  Screening:  Hepatitis C Screening: does qualify; Completed 10/07/2019  Vision Screening: Recommended annual ophthalmology exams for early detection of glaucoma and other disorders of the eye. Is the patient up to date with their annual eye exam?  Yes  Who is the provider or what is the name of the office in which the patient attends annual [facility] exams[facility] If pt is not established with a provider, would they like to be referred to a provider to establish care? No .   Dental Screening: Recommended annual dental exams for proper oral hygiene  Community Resource Referral / Chronic Care Management: CRR required this visit?  No   CCM required this visit?  No      Plan:     I have personally reviewed and noted the following in the patient's chart:   Medical and social history Use of alcohol, tobacco or illicit drugs  Current medications and supplements including opioid prescriptions.  Functional ability and status Nutritional status Physical activity Advanced directives List of other physicians Hospitalizations, surgeries, and ER visits in previous 12 months Vitals Screenings to include cognitive, depression, and falls Referrals and appointments  In addition, I have reviewed and discussed with patient certain preventive protocols, quality metrics, and best practice recommendations. A written personalized care plan for preventive services as well as general preventive health recommendations were provided to patient.     [name], LPN   12/10/2838   Nurse Notes: retaining fluid, high cost of ozempic

## 2021-10-18 NOTE — Patient Instructions (Signed)
[name] Wright , Thank you for taking time to come for your Medicare Wellness Visit. I appreciate your ongoing commitment to your health goals. Please review the following plan we discussed and let me know if I can assist you in the future.   Screening recommendations/referrals: Colonoscopy: completed 08/25/2020, due 08/25/2021 Mammogram: completed 07/2021 per patient Bone Density: n/a Recommended yearly ophthalmology/optometry visit for glaucoma screening and checkup Recommended yearly dental visit for hygiene and checkup  Vaccinations: Influenza vaccine: decline Pneumococcal vaccine: n/a Tdap vaccine: decline Shingles vaccine: decline  Covid-19:  decline  Advanced directives: Advance directive discussed with you today. Even though you declined this today please call our office should you change your mind and we can give you the proper paperwork for you to fill out.  Conditions/risks identified: swelling, high cost of ozempic  Next appointment: Follow up in one year for your annual wellness visit.   Preventive Care 40-64 Years, Female Preventive care refers to lifestyle choices and visits with your health care provider that can promote health and wellness. What does preventive care include? A yearly physical exam. This is also called an annual well check. Dental exams once or twice a year. Routine eye exams. Ask your health care provider how often you should have your eyes checked. Personal lifestyle choices, including: Daily care of your teeth and gums. Regular physical activity. Eating a healthy diet. Avoiding tobacco and drug use. Limiting alcohol use. Practicing safe sex. Taking low-dose aspirin daily starting at age 82. Taking vitamin and mineral supplements as recommended by your health care provider. What happens during an annual well check? The services and screenings done by your health care provider during your annual well check will depend on your age, overall health,  lifestyle risk factors, and family history of disease. Counseling  Your health care provider may ask you questions about your: Alcohol use. Tobacco use. Drug use. Emotional well-being. Home and relationship well-being. Sexual activity. Eating habits. Work and work Statistician. Method of birth control. Menstrual cycle. Pregnancy history. Screening  You may have the following tests or measurements: Height, weight, and BMI. Blood pressure. Lipid and cholesterol levels. These may be checked every 5 years, or more frequently if you are over 65 years old. Skin check. Lung cancer screening. You may have this screening every year starting at age 4 if you have a 30-pack-year history of smoking and currently smoke or have quit within the past 15 years. Fecal occult blood test (FOBT) of the stool. You may have this test every year starting at age 85. Flexible sigmoidoscopy or colonoscopy. You may have a sigmoidoscopy every 5 years or a colonoscopy every 10 years starting at age 73. Hepatitis C blood test. Hepatitis B blood test. Sexually transmitted disease (STD) testing. Diabetes screening. This is done by checking your blood sugar (glucose) after you have not eaten for a while (fasting). You may have this done every 1-3 years. Mammogram. This may be done every 1-2 years. Talk to your health care provider about when you should start having regular mammograms. This may depend on whether you have a family history of breast cancer. BRCA-related cancer screening. This may be done if you have a family history of breast, ovarian, tubal, or peritoneal cancers. Pelvic exam and Pap test. This may be done every 3 years starting at age 70. Starting at age 29, this may be done every 5 years if you have a Pap test in combination with an HPV test. Bone density scan. This is done  to screen for osteoporosis. You may have this scan if you are at high risk for osteoporosis. Discuss your test results, treatment  options, and if necessary, the need for more tests with your health care provider. Vaccines  Your health care provider may recommend certain vaccines, such as: Influenza vaccine. This is recommended every year. Tetanus, diphtheria, and acellular pertussis (Tdap, Td) vaccine. You may need a Td booster every 10 years. Zoster vaccine. You may need this after age 41. Pneumococcal 13-valent conjugate (PCV13) vaccine. You may need this if you have certain conditions and were not previously vaccinated. Pneumococcal polysaccharide (PPSV23) vaccine. You may need one or two doses if you smoke cigarettes or if you have certain conditions. Talk to your health care provider about which screenings and vaccines you need and how often you need them. This information is not intended to replace advice given to you by your health care provider. Make sure you discuss any questions you have with your health care provider. Document Released: 04/22/2015 Document Revised: 12/14/2015 Document Reviewed: 01/25/2015 Elsevier Interactive Patient Education  2017 [employer] Prevention in the Home Falls can cause injuries. They can happen to people of all ages. There are many things you can do to make your home safe and to help prevent falls. What can I do on the outside of my home? Regularly fix the edges of walkways and driveways and fix any cracks. Remove anything that might make you trip as you walk through a door, such as a raised step or threshold. Trim any bushes or trees on the path to your home. Use bright outdoor lighting. Clear any walking paths of anything that might make someone trip, such as rocks or tools. Regularly check to see if handrails are loose or broken. Make sure that both sides of any steps have handrails. Any raised decks and porches should have guardrails on the edges. Have any leaves, snow, or ice cleared regularly. Use sand or salt on walking paths during winter. Clean up any  spills in your garage right away. This includes oil or grease spills. What can I do in the bathroom? Use night lights. Install grab bars by the toilet and in the tub and shower. Do not use towel bars as grab bars. Use non-skid mats or decals in the tub or shower. If you need to sit down in the shower, use a plastic, non-slip stool. Keep the floor dry. Clean up any water that spills on the floor as soon as it happens. Remove soap buildup in the tub or shower regularly. Attach bath mats securely with double-sided non-slip rug tape. Do not have throw rugs and other things on the floor that can make you trip. What can I do in the bedroom? Use night lights. Make sure that you have a light by your bed that is easy to reach. Do not use any sheets or blankets that are too big for your bed. They should not hang down onto the floor. Have a firm chair that has side arms. You can use this for support while you get dressed. Do not have throw rugs and other things on the floor that can make you trip. What can I do in the kitchen? Clean up any spills right away. Avoid walking on wet floors. Keep items that you use a lot in easy-to-reach places. If you need to reach something above you, use a strong step stool that has a grab bar. Keep electrical cords out of the  way. Do not use floor polish or wax that makes floors slippery. If you must use wax, use non-skid floor wax. Do not have throw rugs and other things on the floor that can make you trip. What can I do with my stairs? Do not leave any items on the stairs. Make sure that there are handrails on both sides of the stairs and use them. Fix handrails that are broken or loose. Make sure that handrails are as long as the stairways. Check any carpeting to make sure that it is firmly attached to the stairs. Fix any carpet that is loose or worn. Avoid having throw rugs at the top or bottom of the stairs. If you do have throw rugs, attach them to the floor  with carpet tape. Make sure that you have a light switch at the top of the stairs and the bottom of the stairs. If you do not have them, ask someone to add them for you. What else can I do to help prevent falls? Wear shoes that: Do not have high heels. Have rubber bottoms. Are comfortable and fit you well. Are closed at the toe. Do not wear sandals. If you use a stepladder: Make sure that it is fully opened. Do not climb a closed stepladder. Make sure that both sides of the stepladder are locked into place. Ask someone to hold it for you, if possible. Clearly mark and make sure that you can see: Any grab bars or handrails. First and last steps. Where the edge of each step is. Use tools that help you move around (mobility aids) if they are needed. These include: Canes. Walkers. Scooters. Crutches. Turn on the lights when you go into a dark area. Replace any light bulbs as soon as they burn out. Set up your furniture so you have a clear path. Avoid moving your furniture around. If any of your floors are uneven, fix them. If there are any pets around you, be aware of where they are. Review your medicines with your doctor. Some medicines can make you feel dizzy. This can increase your chance of falling. Ask your doctor what other things that you can do to help prevent falls. This information is not intended to replace advice given to you by your health care provider. Make sure you discuss any questions you have with your health care provider. Document Released: 01/20/2009 Document Revised: 09/01/2015 Document Reviewed: 04/30/2014 Elsevier Interactive Patient Education  2017 [employer].

## 2021-10-18 NOTE — Progress Notes (Signed)
I,[name],acting as a Education administrator for [employer], FNP.,have documented all relevant documentation on the behalf of [name], FNP,as directed by  [name], FNP while in the presence of [name], [place].  Subjective:     Patient ID: [name] Wright , female    DOB: 15-Apr-1965 , 56 y.o.   MRN: [id number]   Chief Complaint  Patient presents with   Hypertension    HPI  The patient is here today for a diabetes and blood pressure f/u. She reports compliance with meds. 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 She has reached the donut hole and does not qualify for patient assistance.    Wt Readings from Last 3 Encounters: 10/18/21 : (!) 382 lb (173.3 kg) 10/18/21 : (!) 382 lb 6.4 oz (173.5 kg) 06/07/21 : (!) 379 lb 3.2 oz (172 kg)  Ozempic 0.5 mg but has been 0.25 mg x 2 weeks due to not being able to pay.   Hypertension This is a chronic problem. The current episode started more than 1 month ago. The problem is unchanged. Pertinent negatives include no blurred vision, neck pain or orthopnea. Risk factors for coronary artery disease include obesity, sedentary lifestyle and diabetes mellitus. Past treatments include diuretics. Compliance problems include exercise.  There is no history of chronic renal disease.  Diabetes She presents for her follow-up diabetic visit. She has type 2 diabetes mellitus. There are no hypoglycemic associated symptoms. Pertinent negatives for diabetes include no blurred vision, no polydipsia, no polyphagia and no polyuria. There are no hypoglycemic complications. Risk factors for coronary artery disease include diabetes mellitus, dyslipidemia, hypertension, sedentary lifestyle and obesity. An ACE inhibitor/angiotensin II receptor blocker is being taken. Eye exam is current.     Past Medical History:  Diagnosis Date   Acute cystitis    Allergic rhinitis    Childhood  asthma    no problems as adult - no inhaler   Chronic insomnia    Chronic pain due to trauma 02/2001   closed head trauma - Followed by Dr [facility]   Complication of anesthesia    Dyspepsia    Family history of ovarian cancer    Head trauma 02/24/2001   closed   History of kidney stones    passed stone - no surgery required   Hypothyroidism    PONV (postoperative nausea and vomiting)    Pre-diabetes    SVD (spontaneous vaginal delivery)    fetal demise at 55 months     Family History  Problem Relation Age of Onset   Multiple sclerosis Mother 72   Bladder Cancer Mother 68   Heart Problems Father    Dementia Paternal Aunt    Ovarian cancer Maternal Grandmother    Lung cancer Maternal Grandfather    Tuberculosis Paternal Grandmother      Current Outpatient Medications:    albuterol (PROAIR HFA) 108 (90 Base) MCG/ACT inhaler, Inhale 2 puffs into the lungs every 4 (four) hours as needed for wheezing or shortness of breath., Disp: 1 each, Rfl: 1   azelastine (ASTELIN) 0.1 % nasal spray, Place 2 sprays into both nostrils 2 (two) times daily. Use in each nostril as directed, Disp: 30 mL, Rfl: 2   baclofen (LIORESAL) 10 MG tablet, Take by mouth., Disp: , Rfl:    butalbital-acetaminophen-caffeine (FIORICET) 50-325-40 MG tablet, Take 1 tablet by mouth every 4 (  four) hours as needed., Disp: , Rfl:    diphenhydrAMINE (BENADRYL) 25 mg capsule, Take 25 mg by mouth every 6 (six) hours as needed for allergies. , Disp: , Rfl:    famotidine (PEPCID) 20 MG tablet, Take 1 tablet (20 mg total) by mouth 2 (two) times daily., Disp: 60 tablet, Rfl: 1   fluticasone (FLONASE) 50 MCG/ACT nasal spray, Place 1 spray into both nostrils in the morning., Disp: , Rfl:    furosemide (LASIX) 80 MG tablet, Take 1 tablet (80 mg total) by mouth daily as needed for fluid., Disp: 90 tablet, Rfl: 0   Insulin Pen Needle (PEN NEEDLES) 32G X 6 MM MISC, 1 each by Does not apply route once a  week., Disp: 30 each, Rfl: 1   levothyroxine (SYNTHROID) 100 MCG tablet, TAKE 1 TABLET BY MOUTH BEFORE BREAKFAST, Disp: 90 tablet, Rfl: 0   losartan (COZAAR) 25 MG tablet, Take 1 tablet (25 mg total) by mouth daily., Disp: 90 tablet, Rfl: 3   Magnesium 400 MG TABS, Take 1 tablet by mouth daily., Disp: , Rfl:    meclizine (ANTIVERT) 25 MG tablet, Take 12.5-25 mg by mouth 2 (two) times daily as needed for dizziness (vertigo)., Disp: , Rfl:    methocarbamol (ROBAXIN) 750 MG tablet, Take 500 mg by mouth every 6 (six) hours as needed for muscle spasms. 1 at bedtime, Disp: , Rfl:    morphine (MSIR) 15 MG tablet, Take 15 mg by mouth 4 (four) times daily as needed for severe pain. , Disp: , Rfl:    Multiple Vitamins-Minerals (MAXIMUM DAILY GREEN PO), Take 1 Package by mouth 3 (three) times a week. [employer]! Greens, Disp: , Rfl:    predniSONE (STERAPRED UNI-PAK 21 TAB) 10 MG (21) TBPK tablet, Use as directed, dispense as 6 day dose pack (Patient not taking: Reported on 10/18/2021), Disp: 21 tablet, Rfl: 0   promethazine (PHENERGAN) 25 MG tablet, TAKE 1/2 TO 1 (ONE-HALF TO ONE) TABLET BY MOUTH EVERY 8 HOURS AS NEEDED FOR NAUSEA, Disp: 40 tablet, Rfl: 0   RESTASIS 0.05 % ophthalmic emulsion, Place 1 drop into both eyes 2 (two) times daily., Disp: , Rfl:    Semaglutide,0.25 or 0.5MG/DOS, (OZEMPIC, 0.25 OR 0.5 MG/DOSE,) 2 MG/1.5ML SOPN, Inject 0.5 mg into the skin once a week., Disp: 1.5 mL, Rfl: 3   SUDOGEST MAXIMUM STRENGTH 30 MG tablet, TAKE 1 TABLET BY MOUTH TWICE DAILY AS NEEDED FOR CONGESTION, Disp: 90 tablet, Rfl: 0   traMADol (ULTRAM) 50 MG tablet, TAKE 1 TABLET BY MOUTH EVERY 6 HOURS AS NEEDED, Disp: 30 tablet, Rfl: 0   Vitamin D, Ergocalciferol, (DRISDOL) 1.25 MG (50000 UNIT) CAPS capsule, Take 1 capsule (50,000 Units total) by mouth every 7 (seven) days., Disp: 12 capsule, Rfl: 0   Allergies  Allergen Reactions   Doxycycline Anaphylaxis, Diarrhea and Nausea And Vomiting    headache    Oxycodone-Acetaminophen Hives and Nausea And Vomiting   Prochlorperazine Edisylate Other (See Comments)    Hyper and jitteriness   Amoxicillin Rash and Other (See Comments)    Has patient had a PCN reaction causing immediate rash, facial/tongue/throat swelling, SOB or lightheadedness with hypotension: Unknown Has patient had a PCN reaction causing severe rash involving mucus membranes or skin necrosis: Unknown Has patient had a PCN reaction that required hospitalization: No Has patient had a PCN reaction occurring within the last 10 years: Yes If all of the above answers are "NO", then may proceed with Cephalosporin use.  Compazine Other (See Comments)    Hyper/jitters/blotches   Eszopiclone Other (See Comments)    Metallic taste and became un effective    Keppra [Levetiracetam] Other (See Comments)    hyperactive   Latex    Levetiracetam Other (See Comments)    Jitters/hyper   Neurontin [Gabapentin] Nausea And Vomiting   Propoxyphene Hives   Tape     PAPER TAPE-skin burns/severe irritation  PATIENT DOES NOT TOLERATE PAPER TAPE   Trileptal [Oxcarbazepine] Other (See Comments)    REACTION: immune system suppressed   Bactrim [Sulfamethoxazole-Trimethoprim] Rash   Bupropion Rash   Codeine Rash   Hydrocodone-Acetaminophen Rash   Moxifloxacin Swelling and Rash   Propoxyphene N-Acetaminophen Nausea And Vomiting and Rash   Vicodin [Hydrocodone-Acetaminophen] Rash     Review of Systems  Constitutional: Negative.   Eyes:  Negative for blurred vision.  Respiratory: Negative.    Cardiovascular: Negative.  Negative for orthopnea.  Gastrointestinal: Negative.   Endocrine: Negative for polydipsia, polyphagia and polyuria.  Musculoskeletal:  Negative for neck pain.  Neurological: Negative.   Psychiatric/Behavioral: Negative.       Today's Vitals   10/18/21 1454  BP: (!) 150/90  Pulse: 96  Temp: 98.5 F (36.9 C)  TempSrc: Oral  Weight: (!) 382 lb (173.3 kg)  Height: 5' 6"   (1.676 m)   Body mass index is 61.66 kg/m.  Wt Readings from Last 3 Encounters:  10/18/21 (!) 382 lb (173.3 kg)  10/18/21 (!) 382 lb 6.4 oz (173.5 kg)  06/07/21 (!) 379 lb 3.2 oz (172 kg)    Objective:  Physical Exam Vitals reviewed.  Constitutional:      General: She is not in acute distress.    Appearance: Normal appearance. She is well-developed. She is obese.  Cardiovascular:     Rate and Rhythm: Normal rate and regular rhythm.     Pulses: Normal pulses.     Heart sounds: Normal heart sounds. No murmur heard. Pulmonary:     Effort: Pulmonary effort is normal. No respiratory distress.     Breath sounds: Normal breath sounds. No wheezing.  Chest:     Chest wall: No tenderness.  Skin:    General: Skin is warm and dry.     Capillary Refill: Capillary refill takes less than 2 seconds.  Neurological:     General: No focal deficit present.     Mental Status: She is alert and oriented to person, place, and time.  Psychiatric:        Mood and Affect: Mood normal.        Behavior: Behavior normal.        Thought Content: Thought content normal.        Judgment: Judgment normal.         Assessment And Plan:     1. Uncontrolled type 2 diabetes mellitus with hyperglycemia ([place]) Comments: She is in the donut hole and can not afford the Ozempic. she is concerned she is gaining weight with the Ozempic.  - Hemoglobin A1c - CMP14+EGFR - Lipid panel  2. Essential hypertension, benign Comments: Blood pressure is elevated with the repeat continues to be elevated. Will restart her furosemide as she has leg swelling and will return in 2 weeks for BP NV  3. B12 deficiency Comments: Stable at last check.  - Vitamin B12  4. Leg swelling - furosemide (LASIX) 80 MG tablet; Take 1 tablet (80 mg total) by mouth daily as needed for fluid.  Dispense: 90 tablet; Refill: 0 -  Brain natriuretic peptide  5. Gastroesophageal reflux disease without esophagitis Comments: Stable, refill sent  to pharmacy - famotidine (PEPCID) 20 MG tablet; Take 1 tablet (20 mg total) by mouth 2 (two) times daily.  Dispense: 60 tablet; Refill: 1  6. Class 3 severe obesity due to excess calories with serious comorbidity and body mass index (BMI) of 60.0 to 69.9 in adult [facility]) She is encouraged to strive for BMI less than 30 to decrease cardiac risk. Advised to aim for at least 150 minutes of exercise per week.    Patient was given opportunity to ask questions. Patient verbalized understanding of the plan and was able to repeat key elements of the plan. All questions were answered to their satisfaction.  [name], FNP   I, [name], FNP, have reviewed all documentation for this visit. The documentation on 10/18/21 for the exam, diagnosis, procedures, and orders are all accurate and complete.   IF YOU HAVE BEEN REFERRED TO A SPECIALIST, IT MAY TAKE 1-2 WEEKS TO SCHEDULE/PROCESS THE REFERRAL. IF YOU HAVE NOT HEARD FROM US/SPECIALIST IN TWO WEEKS, PLEASE GIVE [place] A CALL AT [phone] X [phone].   THE PATIENT IS ENCOURAGED TO PRACTICE SOCIAL DISTANCING DUE TO THE COVID-19 PANDEMIC.

## 2021-10-18 NOTE — Patient Instructions (Signed)
Hypertension, Adult High blood pressure (hypertension) is when the force of blood pumping through the arteries is too strong. The arteries are the blood vessels that carry blood from the heart throughout the body. Hypertension forces the heart to work harder to pump blood and may cause arteries to become narrow or stiff. Untreated or uncontrolled hypertension can lead to a heart attack, heart failure, a stroke, kidney disease, and other problems. A blood pressure reading consists of a higher number over a lower number. Ideally, your blood pressure should be below 120/80. The first ("top") number is called the systolic pressure. It is a measure of the pressure in your arteries as your heart beats. The second ("bottom") number is called the diastolic pressure. It is a measure of the pressure in your arteries as the heart relaxes. What are the causes? The exact cause of this condition is not known. There are some conditions that result in high blood pressure. What increases the risk? Certain factors may make you more likely to develop high blood pressure. Some of these risk factors are under your control, including: Smoking. Not getting enough exercise or physical activity. Being overweight. Having too much fat, sugar, calories, or salt (sodium) in your diet. Drinking too much alcohol. Other risk factors include: Having a personal history of heart disease, diabetes, high cholesterol, or kidney disease. Stress. Having a family history of high blood pressure and high cholesterol. Having obstructive sleep apnea. Age. The risk increases with age. What are the signs or symptoms? High blood pressure may not cause symptoms. Very high blood pressure (hypertensive crisis) may cause: Headache. Fast or irregular heartbeats (palpitations). Shortness of breath. Nosebleed. Nausea and vomiting. Vision changes. Severe chest pain, dizziness, and seizures. How is this diagnosed? This condition is diagnosed by  measuring your blood pressure while you are seated, with your arm resting on a flat surface, your legs uncrossed, and your feet flat on the floor. The cuff of the blood pressure monitor will be placed directly against the skin of your upper arm at the level of your heart. Blood pressure should be measured at least twice using the same arm. Certain conditions can cause a difference in blood pressure between your right and left arms. If you have a high blood pressure reading during one visit or you have normal blood pressure with other risk factors, you may be asked to: Return on a different day to have your blood pressure checked again. Monitor your blood pressure at home for 1 week or longer. If you are diagnosed with hypertension, you may have other blood or imaging tests to help your health care provider understand your overall risk for other conditions. How is this treated? This condition is treated by making healthy lifestyle changes, such as eating healthy foods, exercising more, and reducing your alcohol intake. You may be referred for counseling on a healthy diet and physical activity. Your health care provider may prescribe medicine if lifestyle changes are not enough to get your blood pressure under control and if: Your systolic blood pressure is above 130. Your diastolic blood pressure is above 80. Your personal target blood pressure may vary depending on your medical conditions, your age, and other factors. Follow these instructions at home: Eating and drinking  Eat a diet that is high in fiber and potassium, and low in sodium, added sugar, and fat. An example of this eating plan is called the DASH diet. DASH stands for Dietary Approaches to Stop Hypertension. To eat this way: Eat   plenty of fresh fruits and vegetables. Try to fill one half of your plate at each meal with fruits and vegetables. Eat whole grains, such as whole-wheat pasta, brown rice, or whole-grain bread. Fill about one  fourth of your plate with whole grains. Eat or drink low-fat dairy products, such as skim milk or low-fat yogurt. Avoid fatty cuts of meat, processed or cured meats, and poultry with skin. Fill about one fourth of your plate with lean proteins, such as fish, chicken without skin, beans, eggs, or tofu. Avoid pre-made and processed foods. These tend to be higher in sodium, added sugar, and fat. Reduce your daily sodium intake. Many people with hypertension should eat less than 1,500 mg of sodium a day. Do not drink alcohol if: Your health care provider tells you not to drink. You are pregnant, may be pregnant, or are planning to become pregnant. If you drink alcohol: Limit how much you have to: 0-1 drink a day for women. 0-2 drinks a day for men. Know how much alcohol is in your drink. In the [place] one drink equals one 12 oz bottle of beer (355 mL), one 5 oz glass of wine (148 mL), or one 1 oz glass of hard liquor (44 mL). Lifestyle  Work with your health care provider to maintain a healthy body weight or to lose weight. Ask what an ideal weight is for you. Get at least 30 minutes of exercise that causes your heart to beat faster (aerobic exercise) most days of the week. Activities may include walking, swimming, or biking. Include exercise to strengthen your muscles (resistance exercise), such as Pilates or lifting weights, as part of your weekly exercise routine. Try to do these types of exercises for 30 minutes at least 3 days a week. Do not use any products that contain nicotine or tobacco. These products include cigarettes, chewing tobacco, and vaping devices, such as e-cigarettes. If you need help quitting, ask your health care provider. Monitor your blood pressure at home as told by your health care provider. Keep all follow-up visits. This is important. Medicines Take over-the-counter and prescription medicines only as told by your health care provider. Follow directions carefully. Blood  pressure medicines must be taken as prescribed. Do not skip doses of blood pressure medicine. Doing this puts you at risk for problems and can make the medicine less effective. Ask your health care provider about side effects or reactions to medicines that you should watch for. Contact a health care provider if you: Think you are having a reaction to a medicine you are taking. Have headaches that keep coming back (recurring). Feel dizzy. Have swelling in your ankles. Have trouble with your vision. Get help right away if you: Develop a severe headache or confusion. Have unusual weakness or numbness. Feel faint. Have severe pain in your chest or abdomen. Vomit repeatedly. Have trouble breathing. These symptoms may be an emergency. Get help right away. Call 911. Do not wait to see if the symptoms will go away. Do not drive yourself to the hospital. Summary Hypertension is when the force of blood pumping through your arteries is too strong. If this condition is not controlled, it may put you at risk for serious complications. Your personal target blood pressure may vary depending on your medical conditions, your age, and other factors. For most people, a normal blood pressure is less than 120/80. Hypertension is treated with lifestyle changes, medicines, or a combination of both. Lifestyle changes include losing weight, eating a healthy,   low-sodium diet, exercising more, and limiting alcohol. This information is not intended to replace advice given to you by your health care provider. Make sure you discuss any questions you have with your health care provider. Document Revised: 01/31/2021 Document Reviewed: 01/31/2021 Elsevier Patient Education  2023 Elsevier Inc.  

## 2021-10-19 ENCOUNTER — Telehealth: Payer: Self-pay | Admitting: *Deleted

## 2021-10-19 LAB — CMP14+EGFR
ALT: 29 IU/L (ref 0–32)
AST: 31 IU/L (ref 0–40)
Albumin/Globulin Ratio: 1.4 (ref 1.2–2.2)
Albumin: 4.3 g/dL (ref 3.8–4.9)
Alkaline Phosphatase: 110 IU/L (ref 44–121)
BUN/Creatinine Ratio: 14 (ref 9–23)
BUN: 10 mg/dL (ref 6–24)
Bilirubin Total: 0.3 mg/dL (ref 0.0–1.2)
CO2: 26 mmol/L (ref 20–29)
Calcium: 9.3 mg/dL (ref 8.7–10.2)
Chloride: 100 mmol/L (ref 96–106)
Creatinine, Ser: 0.73 mg/dL (ref 0.57–1.00)
Globulin, Total: 3 g/dL (ref 1.5–4.5)
Glucose: 113 mg/dL — ABNORMAL HIGH (ref 70–99)
Potassium: 4.2 mmol/L (ref 3.5–5.2)
Sodium: 140 mmol/L (ref 134–144)
Total Protein: 7.3 g/dL (ref 6.0–8.5)
eGFR: 98 mL/min/{1.73_m2} (ref >59)

## 2021-10-19 LAB — LIPID PANEL
Chol/HDL Ratio: 3.5 ratio (ref 0.0–4.4)
Cholesterol, Total: 179 mg/dL (ref 100–199)
HDL: 51 mg/dL (ref >39)
LDL Chol Calc (NIH): 106 mg/dL — ABNORMAL HIGH (ref 0–99)
Triglycerides: 121 mg/dL (ref 0–149)
VLDL Cholesterol Cal: 22 mg/dL (ref 5–40)

## 2021-10-19 LAB — BRAIN NATRIURETIC PEPTIDE: BNP: 43.7 pg/mL (ref 0.0–100.0)

## 2021-10-19 LAB — HEMOGLOBIN A1C
Est. average glucose Bld gHb Est-mCnc: 137 mg/dL
Hgb A1c MFr Bld: 6.4 % — ABNORMAL HIGH (ref 4.8–5.6)

## 2021-10-19 LAB — VITAMIN B12: Vitamin B-12: 533 pg/mL (ref 232–1245)

## 2021-10-19 NOTE — Telephone Encounter (Signed)
   Telephone encounter was:  Successful.  10/19/2021 Name: SHAUNETTE GASSNER MRN: [id number] DOB: [birth year]  JEANETTA ALONZO is a 56 y.o. year old female who is a primary care patient of [name], MD . The community resource team was consulted for assistance with  rx assistance  Care guide performed the following interventions: Patient provided with information about care guide support team and interviewed to confirm resource needs.  Follow Up Plan:  No further follow up planned at this time. The patient has been provided with needed resources.  [facility], Care Management  [phone] [address] , [place] [address] Email : [email or web address]

## 2021-10-26 ENCOUNTER — Telehealth: Payer: Self-pay | Admitting: *Deleted

## 2021-10-26 NOTE — Chronic Care Management (AMB) (Signed)
  Care Management   Note  10/26/2021 Name: KI LUCKMAN MRN: [id number] DOB: [birth year]  BARBRA MINER is a 56 y.o. year old female who is a primary care patient of [name], MD. I reached out to Amedeo Plenty by phone today offer care coordination services.   Ms. Fickling was given information about care management services today including:  Care management services include personalized support from designated clinical staff supervised by her physician, including individualized plan of care and coordination with other care providers 24/7 contact phone numbers for assistance for urgent and routine care needs. The patient may stop care management services at any time by phone call to the office staff.  Patient agreed to services and verbal consent obtained.   Follow up plan: Telephone appointment with care management team member scheduled for:11/15/21  Jihaad Bruschi  Care Coordination Care Guide  Direct Dial: [phone]

## 2021-11-02 ENCOUNTER — Telehealth: Payer: Self-pay

## 2021-11-02 NOTE — Chronic Care Management (AMB) (Signed)
11-02-2021: Patient stated she will drop of income verification for Ozempic patient assistance next week.  [facility] Pharmacist Assistant [phone]

## 2021-11-08 ENCOUNTER — Telehealth: Payer: Self-pay

## 2021-11-08 NOTE — Chronic Care Management (AMB) (Signed)
Chronic Care Management Pharmacy Assistant   Name: [name] Wright  MRN: [id number] DOB: 1966-03-06  Reason for Encounter: Chart review for CPP visit on 11-15-2021   Conditions to be addressed/monitored: HTN, Hypertriglyceridemia, DMII, and Allergic Rhinitis  Recent office visits:  10-18-2021 [name] Wright, [place]. Glucose= 113. A1C= 6.4. LDL= 106. START lasix 80 mg daily PRN.  10-18-2021 [name] Simmering, LPN. Medicare wellness visit.  06-07-2021 [name] Wright. Glucose= 174. A1C= 7.6. CHANGE losartan 25 mg half tablet daily TO 25 mg daily. B12 injection given.  05-15-2021 [name] Wright. Visit for upper respiratory infection.  Recent consult visits:  09-08-2021 [name] Wright (Pain medicine). Visit for neck pain. START tramadol 100 mg daily PRN, Robaxin 500 mg in the morning and afternoon PRN, Phenergan 25 mg every 8 hours as needed, Morphine 15 mg daily PRN, Fioricet every 4 hours PRN and Baclofen 10 mg 1-2 tablets at bedtime PRN.  06-30-2021 [name] Southward, Wright (Allergist). Unable to view encounter.  06-20-2021 [name] Maywood, PA (ENT). Initial consult. Unable to view encounter.  06-15-2021 [name] Southward, Wright (Allergist). Unable to view encounter.  06-02-2021 [name] Wright, [name] Grinder, Wright (Allergist). Unable to view encounter.  Hospital visits:  None in previous 6 months  Medications: Outpatient Encounter Medications as of 11/08/2021  Medication Sig   albuterol (PROAIR HFA) 108 (90 Base) MCG/ACT inhaler Inhale 2 puffs into the lungs every 4 (four) hours as needed for wheezing or shortness of breath.   azelastine (ASTELIN) 0.1 % nasal spray Place 2 sprays into both nostrils 2 (two) times daily. Use in each nostril as directed   baclofen (LIORESAL) 10 MG tablet Take by mouth.   butalbital-acetaminophen-caffeine (FIORICET) 50-325-40 MG tablet Take 1 tablet by mouth every 4 (four) hours as needed.   diphenhydrAMINE (BENADRYL) 25 mg  capsule Take 25 mg by mouth every 6 (six) hours as needed for allergies.    famotidine (PEPCID) 20 MG tablet Take 1 tablet (20 mg total) by mouth 2 (two) times daily.   fluticasone (FLONASE) 50 MCG/ACT nasal spray Place 1 spray into both nostrils in the morning.   furosemide (LASIX) 80 MG tablet Take 1 tablet (80 mg total) by mouth daily as needed for fluid.   Insulin Pen Needle (PEN NEEDLES) 32G X 6 MM MISC 1 each by Does not apply route once a week.   levothyroxine (SYNTHROID) 100 MCG tablet TAKE 1 TABLET BY MOUTH BEFORE BREAKFAST   losartan (COZAAR) 25 MG tablet Take 1 tablet (25 mg total) by mouth daily.   Magnesium 400 MG TABS Take 1 tablet by mouth daily.   meclizine (ANTIVERT) 25 MG tablet Take 12.5-25 mg by mouth 2 (two) times daily as needed for dizziness (vertigo).   methocarbamol (ROBAXIN) 750 MG tablet Take 500 mg by mouth every 6 (six) hours as needed for muscle spasms. 1 at bedtime   morphine (MSIR) 15 MG tablet Take 15 mg by mouth 4 (four) times daily as needed for severe pain.    Multiple Vitamins-Minerals (MAXIMUM DAILY GREEN PO) Take 1 Package by mouth 3 (three) times a week. It Works! Greens   predniSONE (STERAPRED UNI-PAK 21 TAB) 10 MG (21) TBPK tablet Use as directed, dispense as 6 day dose pack (Patient not taking: Reported on 10/18/2021)   promethazine (PHENERGAN) 25 MG tablet TAKE 1/2 TO 1 (ONE-HALF TO ONE) TABLET BY MOUTH EVERY 8 HOURS AS NEEDED FOR NAUSEA   RESTASIS 0.05 % ophthalmic emulsion Place  1 drop into both eyes 2 (two) times daily.   Semaglutide,0.25 or 0.'5MG'$ /DOS, (OZEMPIC, 0.25 OR 0.5 MG/DOSE,) 2 MG/1.5ML SOPN Inject 0.5 mg into the skin once a week.   SUDOGEST MAXIMUM STRENGTH 30 MG tablet TAKE 1 TABLET BY MOUTH TWICE DAILY AS NEEDED FOR CONGESTION   traMADol (ULTRAM) 50 MG tablet TAKE 1 TABLET BY MOUTH EVERY 6 HOURS AS NEEDED   Vitamin D, Ergocalciferol, (DRISDOL) 1.25 MG (50000 UNIT) CAPS capsule Take 1 capsule (50,000 Units total) by mouth every 7 (seven)  days.   No facility-administered encounter medications on file as of 11/08/2021.   Lab Results  Component Value Date/Time   HGBA1C 6.4 (H) 10/18/2021 04:24 PM   HGBA1C 7.6 (H) 06/07/2021 11:51 AM   MICROALBUR 10 02/07/2021 12:22 PM   MICROALBUR 10 10/05/2020 11:29 AM     BP Readings from Last 3 Encounters:  10/18/21 (!) 150/90  10/18/21 (!) 146/90  06/07/21 (!) 120/96      Have you seen any other providers since your last visit with PCP? No  What is your top health concern to discuss at your upcoming visit? Patient stated no  Do you have any problems getting your medications from the pharmacy? Yes  Financial barriers? Yes Access barriers? No   If yes, please explain: Ozempic copay is expensive. Patient assistance is in process. Patient is aware income verification is needed.   [name] Wright was reminded to have all medications, supplements and any blood glucose and/or blood pressure readings available for review with [name] Wright, Pharm. D, at her telephone visit on 11-15-2021 at 9:00.   Care Gaps: Last annual wellness visit? 10-18-2021 Last Colonoscopy? 08-25-2020 Last Mammogram? 07-11-2021 Last Bone Denisty? None  Last eye exam / retinopathy screening? 03-21-2021 Last diabetic foot exam? None  Immunizations: Covid vaccine overdue Flu vaccine overdue  Star Rating Drugs: Ozempic 0.5 mg- Samples Losartan 25 mg- Patient stated medication discontinued because it caused headaches.  [name] Wright Pharmacist Assistant [phone]
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
## 2021-11-15 ENCOUNTER — Ambulatory Visit: Payer: Medicare Other

## 2021-11-15 ENCOUNTER — Encounter (INDEPENDENT_AMBULATORY_CARE_PROVIDER_SITE_OTHER): Payer: Self-pay

## 2021-11-15 DIAGNOSIS — I10 Essential (primary) hypertension: Secondary | ICD-10-CM

## 2021-11-15 DIAGNOSIS — E1165 Type 2 diabetes mellitus with hyperglycemia: Secondary | ICD-10-CM

## 2021-11-18 DIAGNOSIS — J40 Bronchitis, not specified as acute or chronic: Secondary | ICD-10-CM | POA: Diagnosis not present

## 2021-11-18 DIAGNOSIS — R059 Cough, unspecified: Secondary | ICD-10-CM | POA: Diagnosis not present

## 2021-11-21 NOTE — Patient Instructions (Addendum)
Visit Information It was great speaking with you today!  Please let me know if you have any questions about our visit.   Goals Addressed             This Visit's Progress    Manage My Medicine       Timeframe:  Long-Range Goal Priority:  High Start Date:                             Expected End Date:                       Follow Up Date 12/12/2021    - call for medicine refill 2 or 3 days before it runs out - call if I am sick and can't take my medicine - keep a list of all the medicines I take; vitamins and herbals too - learn to read medicine labels - use an alarm clock or phone to remind me to take my medicine    Why is this important?   These steps will help you keep on track with your medicines.   Notes:  -Please call with any questions you might have -Please bring in financial documentation for patient assistance for Ozempic, and sign all paperwork as identified in your after visit summary        Patient Care Plan: CCM Pharmacy Care Plan     Problem Identified: HTN, DM II      Goal: Disease Management   Note:   Current Barriers:  Unable to independently monitor therapeutic efficacy Unable to achieve control of hypertension   Pharmacist Clinical Goal(s):  Patient will achieve adherence to monitoring guidelines and medication adherence to achieve therapeutic efficacy through collaboration with PharmD and provider.   Interventions: 1:1 collaboration with [name], MD regarding development and update of comprehensive plan of care as evidenced by provider attestation and co-signature Inter-disciplinary care team collaboration (see longitudinal plan of care) Comprehensive medication review performed; medication list updated in electronic medical record  Hypertension (BP goal <130/80) -Uncontrolled -Current treatment: Losartan 25 mg tablet once daily once daily Appropriate, Query effective Patient reports that she is not taking her medication at home  for the past two months   -Current home readings: high 130's, she is concerned about the lo -Current dietary habits: will discuss exercise more  -Current exercise habits: she is walking for at least 2500 steps per day -Denies hypotensive/hypertensive symptoms -Educated on Exercise goal of 150 minutes per week; Importance of home blood pressure monitoring; Proper BP monitoring technique; -Her current goal is to to exercise for at least 30 minutes to an hour per week -Patient reports her BP cuff is 2-3 months old,   -She takes it in the morning, if it is elevated she takes it a couple times during the day    -Patient reports that elevation is   -She is also using a monitor watch that detects her pulse  -she has not been on Losartan for three to four months  -Patient reports that the last time she saw PCP team, there was no discussion about her BP and possible steps to take  -Patient reports that her BP has been high due to using her mask in the doctors office  -I discussed in great detail the importance of writing down her BP readings  -Counseled to monitor BP at home at least once per day, document, and provide log at future  appointments -Discussed in great detail the importance of starting a BP medication, Ms.Stampley is open to this plan, and would like this to start after the 28th of August due to her storage being moved.  -HC to follow up with Ms. Beth once per week for BP reading log, patient would like HC to discuss a time to do these calls, she prefers the afternoon -Recommended patient start an ARB, due to elevated BP readings  Collaborated with patient and PCP team to start patient on ARB   Diabetes (A1c goal <7%) -Controlled -Current medications: Ozempic 0.5 mg once per week. Appropriate, Effective, Safe, Query accessible Patient reports that she was trying to take 0.33 of Ozempic, she reported that she started having headaches at 0.5 mg  -Current home glucose readings fasting  glucose: she is not checking her BS at home -Denies hypoglycemic/hyperglycemic symptoms -Current meal patterns: patient reports that she is eating more healthy  -Current exercise: she is exercising more often, she has an alarm on her watch that reminds her to move  -Educated on A1c and blood sugar goals; -Counseled to check feet daily and get yearly eye exams -She is placed on steroids periodically due to prednisone use or her gall bladder  -Patient reports that the cost of Ozempic is too high and she is going to be completing the patient assistance application:   1. We will need the following:          - Identification          - Income information                     - Insurance cards  -Recommended to continue current medication Assessed patient finances. Patient is going to bring in financial documents    Patient Goals/Self-Care Activities Patient will:  - take medications as prescribed as evidenced by patient report and record review collaborate with provider on medication access solutions  Follow Up Plan: The patient has been provided with contact information for the care management team and has been advised to call with any health related questions or concerns.       Ms. Tatem was given information about Chronic Care Management services today including:  CCM service includes personalized support from designated clinical staff supervised by her physician, including individualized plan of care and coordination with other care providers 24/7 contact phone numbers for assistance for urgent and routine care needs. Standard insurance, coinsurance, copays and deductibles apply for chronic care management only during months in which we provide at least 20 minutes of these services. Most insurances cover these services at 100%, however patients may be responsible for any copay, coinsurance and/or deductible if applicable. This service may help you avoid the need for more expensive  face-to-face services. Only one practitioner may furnish and bill the service in a calendar month. The patient may stop CCM services at any time (effective at the end of the month) by phone call to the office staff.  Patient agreed to services and verbal consent obtained.   The patient verbalized understanding of instructions, educational materials, and care plan provided today and agreed to receive a mailed copy of patient instructions, educational materials, and care plan.   Orlando Penner, PharmD Clinical Pharmacist Practitioner  [facility] [phone]

## 2021-12-08 ENCOUNTER — Telehealth: Payer: Self-pay

## 2021-12-08 NOTE — Chronic Care Management (AMB) (Signed)
     [name] Wright rescheduled 12-12-2021 appointment with Orlando Penner to 12-Jan-2022 due to death in family.   [place] Pharmacist Assistant [phone]

## 2021-12-08 NOTE — Chronic Care Management (AMB) (Unsigned)
Faxed completed [employer] application for Ozempic 0.5 mg.  Pattricia Boss, [place] Pharmacist Assistant [phone]

## 2021-12-12 ENCOUNTER — Telehealth: Payer: Medicare Other

## 2021-12-13 ENCOUNTER — Telehealth: Payer: Self-pay

## 2021-12-13 NOTE — Chronic Care Management (AMB) (Signed)
    Called Amedeo Plenty, No answer, left message of appointment on 12-15-2021 at 11:00 via telephone visit with Orlando Penner, Pharm D. Notified to have all medications, supplements, blood pressure and/or blood sugar logs available during appointment and to return call if need to reschedule.    Care Gaps: Covid vaccine overdue Flu vaccine overdue  Star Rating Drug: Ozempic 0.5 mg- Samples PAP in process   [place] Clinical Pharmacist Assistant [phone]

## 2021-12-15 ENCOUNTER — Ambulatory Visit (INDEPENDENT_AMBULATORY_CARE_PROVIDER_SITE_OTHER): Payer: Medicare Other

## 2021-12-15 DIAGNOSIS — E1165 Type 2 diabetes mellitus with hyperglycemia: Secondary | ICD-10-CM

## 2021-12-15 DIAGNOSIS — I10 Essential (primary) hypertension: Secondary | ICD-10-CM

## 2021-12-15 NOTE — Progress Notes (Unsigned)
Chronic Care Management Pharmacy Note  12/15/2021 Name:  [name] Wright MRN:  [id number] DOB:  02-20-1966  Summary: ***  Recommendations/Changes made from today's visit: ***  Plan: ***   Subjective: [name] Wright is an 56 y.o. year old female who is a primary patient of [name], MD.  The CCM team was consulted for assistance with disease management and care coordination needs.    {CCMTELEPHONEFACETOFACE:21091510} for {CCMINITIALFOLLOWUPCHOICE:21091511} in response to provider referral for pharmacy case management and/or care coordination services.   Consent to Services:  {CCMCONSENTOPTIONS:25074}  Patient Care Team: [name], MD as PCP - General (Internal Medicine) Biagio Borg, MD (Internal Medicine) Mayford Knife, [facility] (Pharmacist)  Recent office visits: ***  Recent consult visits: [facility] visits: {Hospital DC Yes/No:25215}   Objective:  Lab Results  Component Value Date   CREATININE 0.73 10/18/2021   BUN 10 10/18/2021   GFR 99.36 07/14/2020   EGFR 98 10/18/2021   GFRNONAA 79 11/10/2019   GFRAA 91 11/10/2019   NA 140 10/18/2021   K 4.2 10/18/2021   CALCIUM 9.3 10/18/2021   CO2 26 10/18/2021   GLUCOSE 113 (H) 10/18/2021    Lab Results  Component Value Date/Time   HGBA1C 6.4 (H) 10/18/2021 04:24 PM   HGBA1C 7.6 (H) 06/07/2021 11:51 AM   GFR 99.36 07/14/2020 11:41 AM   GFR 101.32 09/01/2015 10:47 AM   MICROALBUR 10 02/07/2021 12:22 PM   MICROALBUR 10 10/05/2020 11:29 AM    Last diabetic Eye exam:  Lab Results  Component Value Date/Time   HMDIABEYEEXA No Retinopathy 03/21/2021 12:00 AM    Last diabetic Foot exam: No results found for: "HMDIABFOOTEX"   Lab Results  Component Value Date   CHOL 179 10/18/2021   HDL 51 10/18/2021   LDLCALC 106 (H) 10/18/2021   TRIG 121 10/18/2021   CHOLHDL 3.5 10/18/2021       Latest Ref Rng & Units 10/18/2021    4:24 PM 02/07/2021   12:07 PM 07/14/2020   11:41 AM  Hepatic  Function  Total Protein 6.0 - 8.5 g/dL 7.3  7.5  7.5   Albumin 3.8 - 4.9 g/dL 4.3  4.0  3.8   AST 0 - 40 IU/L 31  29  13    ALT 0 - 32 IU/L 29  26  15    Alk Phosphatase 44 - 121 IU/L 110  118  96   Total Bilirubin 0.0 - 1.2 mg/dL 0.3  0.5  0.4     Lab Results  Component Value Date/Time   TSH 1.220 10/02/2021 02:44 PM   TSH 1.190 02/07/2021 12:07 PM   FREET4 1.47 10/05/2020 11:22 AM   FREET4 1.16 07/27/2019 12:57 PM       Latest Ref Rng & Units 02/07/2021   12:07 PM 07/14/2020   11:41 AM 06/24/2020   11:00 AM  CBC  WBC 3.4 - 10.8 x10E3/uL 7.9  7.9  7.6   Hemoglobin 11.1 - 15.9 g/dL 14.3  13.7  14.2   Hematocrit 34.0 - 46.6 % 41.8  40.7  43.8   Platelets 150 - 450 x10E3/uL 312  283.0  340     Lab Results  Component Value Date/Time   VD25OH 22.5 (L) 01/25/2020 12:10 PM   VD25OH 25.4 (L) 10/08/2018 01:02 PM    Clinical ASCVD: {YES/NO:21197} The 10-year ASCVD risk score (Arnett DK, et al., 2019) is: 5.8%   Values used to calculate the score:     Age: 63 years  Sex: Female     Is Non-Hispanic African American: No     Diabetic: Yes     Tobacco smoker: No     Systolic Blood Pressure: 092 mmHg     Is BP treated: Yes     HDL Cholesterol: 51 mg/dL     Total Cholesterol: 179 mg/dL       10/18/2021    2:55 PM 10/05/2020   10:23 AM 08/13/2019    9:24 AM  Depression screen PHQ 2/9  Decreased Interest 0 0 0  Down, Depressed, Hopeless 0 0 0  PHQ - 2 Score 0 0 0  Altered sleeping   0  Tired, decreased energy   0  Change in appetite   0  Feeling bad or failure about yourself    0  Trouble concentrating   0  Moving slowly or fidgety/restless   0  Suicidal thoughts   0  PHQ-9 Score   0  Difficult doing work/chores   Not difficult at all     ***Other: (CHADS2VASc if Afib, MMRC or CAT for COPD, ACT, DEXA)  Social History   Tobacco Use  Smoking Status Never  Smokeless Tobacco Never   BP Readings from Last 3 Encounters:  10/18/21 (!) 150/90  10/18/21 (!) 146/90   06/07/21 (!) 120/96   Pulse Readings from Last 3 Encounters:  10/18/21 96  10/18/21 96  06/07/21 88   Wt Readings from Last 3 Encounters:  10/18/21 (!) 382 lb (173.3 kg)  10/18/21 (!) 382 lb 6.4 oz (173.5 kg)  06/07/21 (!) 379 lb 3.2 oz (172 kg)   BMI Readings from Last 3 Encounters:  10/18/21 61.66 kg/m  10/18/21 61.72 kg/m  06/07/21 65.91 kg/m    Assessment/Interventions: Review of patient past medical history, allergies, medications, health status, including review of consultants reports, laboratory and other test data, was performed as part of comprehensive evaluation and provision of chronic care management services.   SDOH:  (Social Determinants of Health) assessments and interventions performed: {yes/no:20286} SDOH Interventions    Flowsheet Row Chronic Care Management from 11/15/2021 in [facility] Clinical Support from 08/13/2019 in [facility] Clinical Support from 08/12/2018 in [facility]  SDOH Interventions     Depression Interventions/Treatment  -- PHQ2-9 Score <4 Follow-up Not Indicated PHQ2-9 Score <4 Follow-up Not Indicated  Financial Strain Interventions Other (Comment)  [Begin enrollment for patient assistance for Ozempic due to cost] -- --      SDOH Screenings   Food Insecurity: No Food Insecurity (10/18/2021)  Transportation Needs: No Transportation Needs (10/18/2021)  Depression (PHQ2-9): Low Risk  (10/18/2021)  Financial Resource Strain: High Risk (11/21/2021)  Physical Activity: Inactive (10/18/2021)  Stress: No Stress Concern Present (10/18/2021)  Tobacco Use: Low Risk  (10/18/2021)    CCM Care Plan  Allergies  Allergen Reactions   Doxycycline Anaphylaxis, Diarrhea and Nausea And Vomiting    headache   Oxycodone-Acetaminophen Hives and Nausea And Vomiting   Prochlorperazine Edisylate Other (See Comments)    Hyper and jitteriness   Amoxicillin Rash and Other (See Comments)    Has  patient had a PCN reaction causing immediate rash, facial/tongue/throat swelling, SOB or lightheadedness with hypotension: Unknown Has patient had a PCN reaction causing severe rash involving mucus membranes or skin necrosis: Unknown Has patient had a PCN reaction that required hospitalization: No Has patient had a PCN reaction occurring within the last 10 years: Yes If all of the above answers are "NO", then may  proceed with Cephalosporin use.    Compazine Other (See Comments)    Hyper/jitters/blotches   Eszopiclone Other (See Comments)    Metallic taste and became un effective    Keppra [Levetiracetam] Other (See Comments)    hyperactive   Latex    Levetiracetam Other (See Comments)    Jitters/hyper   Neurontin [Gabapentin] Nausea And Vomiting   Propoxyphene Hives   Tape     PAPER TAPE-skin burns/severe irritation  PATIENT DOES NOT TOLERATE PAPER TAPE   Trileptal [Oxcarbazepine] Other (See Comments)    REACTION: immune system suppressed   Bactrim [Sulfamethoxazole-Trimethoprim] Rash   Bupropion Rash   Codeine Rash   Hydrocodone-Acetaminophen Rash   Moxifloxacin Swelling and Rash   Propoxyphene N-Acetaminophen Nausea And Vomiting and Rash   Vicodin [Hydrocodone-Acetaminophen] Rash    Medications Reviewed Today     Reviewed by Mayford Knife, RPH (Pharmacist) on 11/15/21 at [place]  Med List Status: <None>   Medication Order Taking? Sig Documenting Provider Last Dose Status Informant  albuterol (PROAIR HFA) 108 (90 Base) MCG/ACT inhaler [id number] No Inhale 2 puffs into the lungs every 4 (four) hours as needed for wheezing or shortness of breath. [name], MD Taking Active   azelastine (ASTELIN) 0.1 % nasal spray [id number] No Place 2 sprays into both nostrils 2 (two) times daily. Use in each nostril as directed [name], FNP Taking Active   baclofen (LIORESAL) 10 MG tablet [id number] No Take by mouth. [provider] Taking Active    butalbital-acetaminophen-caffeine (FIORICET) 50-325-40 MG tablet [id number] No Take 1 tablet by mouth every 4 (four) hours as needed. [provider] Taking Active   diphenhydrAMINE (BENADRYL) 25 mg capsule [id number] No Take 25 mg by mouth every 6 (six) hours as needed for allergies.  [provider] Taking Active Self  famotidine (PEPCID) 20 MG tablet [id number]  Take 1 tablet (20 mg total) by mouth 2 (two) times daily. [name], FNP  Active   fluticasone [facility]) 50 MCG/ACT nasal spray [id number] No Place 1 spray into both nostrils in the morning. [provider] Taking Active Self  furosemide (LASIX) 80 MG tablet [id number]  Take 1 tablet (80 mg total) by mouth daily as needed for fluid. [name], FNP  Active   Insulin Pen Needle (PEN NEEDLES) 32G X 6 MM MISC [id number] No 1 each by Does not apply route once a week. [name], FNP Taking Active   levothyroxine (SYNTHROID) 100 MCG tablet [id number] No TAKE 1 TABLET BY MOUTH BEFORE Adella Nissen, MD Taking Active   losartan (COZAAR) 25 MG tablet [id number]  Take 1 tablet (25 mg total) by mouth daily. [name], MD  Active   Magnesium 400 MG TABS [id number] No Take 1 tablet by mouth daily. [provider] Taking Active Self  meclizine (ANTIVERT) 25 MG tablet [id number] No Take 12.5-25 mg by mouth 2 (two) times daily as needed for dizziness (vertigo). [provider] Taking Active Self  methocarbamol (ROBAXIN) 750 MG tablet [id number] No Take 500 mg by mouth every 6 (six) hours as needed for muscle spasms. 1 at bedtime [provider] Taking Active Self  morphine (MSIR) 15 MG tablet [id number] No Take 15 mg by mouth 4 (four) times daily as needed for severe pain.  [provider] Taking Active Self  Multiple Vitamins-Minerals (MAXIMUM DAILY GREEN PO) [id number] No Take 1 Package by mouth 3 (three) times a week. It Works! Greens [provider] Taking Active Self   predniSONE [facility]  UNI-PAK 21 TAB) 10 MG (21) TBPK tablet [id number] No Use as directed, dispense as 6 day dose pack  Patient not taking: Reported on 10/18/2021   [name], MD Not Taking Active   promethazine (PHENERGAN) 25 MG tablet [id number] No TAKE 1/2 TO 1 (ONE-HALF TO ONE) TABLET BY MOUTH EVERY 8 HOURS AS NEEDED FOR NAUSEA Esterwood, Amy S, PA-C Taking Active   RESTASIS 0.05 % ophthalmic emulsion [id number] No Place 1 drop into both eyes 2 (two) times daily. [provider] Taking Active Self  Semaglutide,0.25 or 0.5MG/DOS, (OZEMPIC, 0.25 OR 0.5 MG/DOSE,) 2 MG/1.5ML SOPN [id number] No Inject 0.5 mg into the skin once a week. [name], MD Taking Active   SUDOGEST MAXIMUM STRENGTH 30 MG tablet [id number] No TAKE 1 TABLET BY MOUTH TWICE DAILY AS NEEDED FOR CONGESTION [name], FNP Taking Active   traMADol (ULTRAM) 50 MG tablet [id number] No TAKE 1 TABLET BY MOUTH EVERY 6 HOURS AS NEEDED Esterwood, Amy S, PA-C Taking Active   Vitamin D, Ergocalciferol, (DRISDOL) 1.25 MG (50000 UNIT) CAPS capsule [id number] No Take 1 capsule (50,000 Units total) by mouth every 7 (seven) days. Bary Castilla, NP Taking Active             Patient Active Problem List   Diagnosis Date Noted   Genetic testing 03/08/2021   Family history of ovarian cancer 02/16/2021   Uncontrolled type 2 diabetes mellitus with hyperglycemia ([place]) 02/07/2021   Essential hypertension, benign 02/07/2021   Gastritis and gastroduodenitis    Nausea without vomiting    Adenomatous polyp of ascending colon    Adenomatous polyp of transverse colon    Adenomatous polyp of sigmoid colon    Rectal polyp    Adenomatous polyp of descending colon    Class 3 severe obesity due to excess calories with serious comorbidity and body mass index (BMI) of 60.0 to 69.9 in adult ([place]) 08/13/2019   Nasal congestion 03/10/2019   Sinus pressure 03/10/2019   Hyperglycemia 01/22/2019   Hypertriglyceridemia 01/22/2019    Encounter for preventative adult health care exam with abnormal findings 09/01/2015   Sinusitis, chronic 09/01/2015   Cough 09/01/2015   RUQ pain 10/27/2014   Urinary incontinence 09/17/2013   Acute sinus infection 10/16/2012   Elevated BP 07/29/2012   Fatigue 07/10/2012   Leg cramps 04/15/2011   Edema 12/21/2010   Chronic pain due to trauma 03/15/2007   ASTHMA, CHILDHOOD 03/15/2007   INSOMNIA, CHRONIC 11/01/2006   ALLERGIC RHINITIS 11/01/2006   DYSPEPSIA, CHRONIC 11/01/2006   HEAD TRAUMA, CLOSED 11/01/2006    Immunization History  Administered Date(s) Administered   Td 09/26/2001    Conditions to be addressed/monitored:  {USCCMDZASSESSMENTOPTIONS:23563}  There are no care plans that you recently modified to display for this patient.    Medication Assistance: {MEDASSISTANCEINFO:25044}  Compliance/Adherence/Medication fill history: Care Gaps: ***  Star-Rating Drugs: ***  Patient's preferred pharmacy is:  [facility] [place] [address] Phone: [phone] Fax: [phone]  Uses pill box? No - patient is not adherent to medication regimen  Pt endorses 90% compliance  We discussed: {Pharmacy options:24294} Patient decided to: {US Pharmacy Plan:23885}  Care Plan and Follow Up Patient Decision:  {FOLLOWUP:24991}  Plan: {CM FOLLOW UP PLAN:25073}  ***     Current Barriers:  Unable to independently monitor therapeutic efficacy Does not adhere to prescribed medication regimen  Pharmacist Clinical Goal(s):  Patient will achieve adherence to monitoring guidelines and medication  adherence to achieve therapeutic efficacy through collaboration with PharmD and provider.   Interventions: 1:1 collaboration with [name], MD regarding development and update of comprehensive plan of care as evidenced by provider attestation and co-signature Inter-disciplinary care team collaboration (see  longitudinal plan of care) Comprehensive medication review performed; medication list updated in electronic medical record  Hypertension (BP goal <130/80) -Uncontrolled -Current treatment: Losartan 25 mg tablet once per day Appropriate, Query effective Patient reports that she is not taking her medication -Current home readings: patient does not have current BP reading log book, her one reading less than 140/80 -Denies hypotensive/hypertensive symptoms -Educated on BP goals and benefits of medications for prevention of heart attack, stroke and kidney damage; Importance of home blood pressure monitoring; Proper BP monitoring technique; -Counseled to monitor BP at home at least twice per week, document, and provide log at future appointments -Recommended to continue current medication   Diabetes (A1c goal <7%) -Controlled -Current medications: Ozempic 0.25 mg once per week Appropriate, Query effective Patient reports having diarrhea, and nausea with medication -Denies hypoglycemic/hyperglycemic symptoms -Current meal patterns: patient reports that this last week she has been on 0.5 mg it was causing nausea and vomiting -Educated on A1c and blood sugar goals; Benefits of weight loss; -Patient reports that the cost of Ozempic 0.5 mg it is 229 dollars, and she picks it up  every two months -Counseled to check feet daily and get yearly eye exams -Educated on the importance of talking with the PCP team about any health changes and side effects that she might have.  Collaborated with PCP team and patient to determine next steps for her medication choices  Patient Goals/Self-Care Activities Patient will:  - take medications as prescribed as evidenced by patient report and record review  Follow Up Plan: The patient has been provided with contact information for the care management team and has been advised to call with any health related questions or concerns.

## 2021-12-19 DIAGNOSIS — J40 Bronchitis, not specified as acute or chronic: Secondary | ICD-10-CM | POA: Diagnosis not present

## 2021-12-19 DIAGNOSIS — R059 Cough, unspecified: Secondary | ICD-10-CM | POA: Diagnosis not present

## 2021-12-19 NOTE — Patient Instructions (Signed)
Visit Information It was great speaking with you today!  Please let me know if you have any questions about our visit.   Goals Addressed             This Visit's Progress    Manage My Medicine       Timeframe:  Long-Range Goal Priority:  High Start Date:                             Expected End Date:                       Follow Up Date: pending follow up with PCP and next steps - tentative 05/01/2022  In progress:   - call for medicine refill 2 or 3 days before it runs out - call if I am sick and can't take my medicine - keep a list of all the medicines I take; vitamins and herbals too - learn to read medicine labels - use an alarm clock or phone to remind me to take my medicine    Why is this important?   These steps will help you keep on track with your medicines.   Notes:  -Please call with any questions you might have -Please bring in financial documentation for patient assistance for Ozempic, and sign all paperwork as identified in your after visit summary        Patient Care Plan: CCM Pharmacy Care Plan     Problem Identified: HTN, DM II      Goal: Disease Management   Note:     Current Barriers:  Unable to independently monitor therapeutic efficacy Does not adhere to prescribed medication regimen  Pharmacist Clinical Goal(s):  Patient will achieve adherence to monitoring guidelines and medication adherence to achieve therapeutic efficacy through collaboration with PharmD and provider.   Interventions: 1:1 collaboration with [name], MD regarding development and update of comprehensive plan of care as evidenced by provider attestation and co-signature Inter-disciplinary care team collaboration (see longitudinal plan of care) Comprehensive medication review performed; medication list updated in electronic medical record  Hypertension (BP goal <130/80) -Uncontrolled -Current treatment: Losartan 25 mg tablet once per day Appropriate, Query  effective Patient reports that she is not taking her medication -Current home readings: patient does not have current BP reading log book, her one reading less than 140/80 -Denies hypotensive/hypertensive symptoms -Educated on BP goals and benefits of medications for prevention of heart attack, stroke and kidney damage; Importance of home blood pressure monitoring; Proper BP monitoring technique; -Counseled to monitor BP at home at least twice per week, document, and provide log at future appointments -Recommended to continue current medication   Diabetes (A1c goal <7%) -Controlled -Current medications: Ozempic 0.25 mg once per week Appropriate, Query effective Patient reports having diarrhea, and nausea with medication -Denies hypoglycemic/hyperglycemic symptoms -Current meal patterns: patient reports that this last week she has been on 0.5 mg it was causing nausea and vomiting -Educated on A1c and blood sugar goals; Benefits of weight loss; -Patient reports that the cost of Ozempic 0.5 mg it is 229 dollars, and she picks it up  every two months -Counseled to check feet daily and get yearly eye exams -Educated on the importance of talking with the PCP team about any health changes and side effects that she might have.  Collaborated with PCP team and patient to determine next steps for her medication choices  Patient Goals/Self-Care  Activities Patient will:  - take medications as prescribed as evidenced by patient report and record review  Follow Up Plan: The patient has been provided with contact information for the care management team and has been advised to call with any health related questions or concerns.       Patient agreed to services and verbal consent obtained.   Patient verbalizes understanding of instructions and care plan provided today and agrees to view in [place]. Active MyChart status and patient understanding of how to access instructions and care plan via  MyChart confirmed with patient.     Orlando Penner, PharmD Clinical Pharmacist [facility] [phone]

## 2021-12-21 ENCOUNTER — Telehealth: Payer: Medicare Other | Admitting: Physician Assistant

## 2021-12-21 DIAGNOSIS — J019 Acute sinusitis, unspecified: Secondary | ICD-10-CM

## 2021-12-21 DIAGNOSIS — B9689 Other specified bacterial agents as the cause of diseases classified elsewhere: Secondary | ICD-10-CM | POA: Diagnosis not present

## 2021-12-21 MED ORDER — LEVOFLOXACIN 500 MG PO TABS: 500.0000 mg | ORAL_TABLET | Freq: Every day | ORAL | 0 refills | Status: AC

## 2021-12-21 NOTE — Progress Notes (Signed)
I have spent 5 minutes in review of e-visit questionnaire, review and updating patient chart, medical decision making and response to patient.   [name] Wright [name] Lynisha Osuch, PA-C    

## 2021-12-21 NOTE — Progress Notes (Signed)

## 2022-01-06 DIAGNOSIS — Z7985 Long-term (current) use of injectable non-insulin antidiabetic drugs: Secondary | ICD-10-CM

## 2022-01-06 DIAGNOSIS — E1159 Type 2 diabetes mellitus with other circulatory complications: Secondary | ICD-10-CM | POA: Diagnosis not present

## 2022-01-06 DIAGNOSIS — I10 Essential (primary) hypertension: Secondary | ICD-10-CM | POA: Diagnosis not present

## 2022-01-08 ENCOUNTER — Other Ambulatory Visit: Payer: Self-pay | Admitting: Nurse Practitioner

## 2022-01-08 DIAGNOSIS — M7989 Other specified soft tissue disorders: Secondary | ICD-10-CM

## 2022-01-12 ENCOUNTER — Telehealth: Payer: Self-pay

## 2022-01-12 NOTE — Telephone Encounter (Signed)
Ozempic arrived from PAP. Pt notified.

## 2022-01-15 ENCOUNTER — Telehealth: Payer: Medicare Other | Admitting: Physician Assistant

## 2022-01-15 DIAGNOSIS — B372 Candidiasis of skin and nail: Secondary | ICD-10-CM | POA: Diagnosis not present

## 2022-01-15 MED ORDER — CLOTRIMAZOLE-BETAMETHASONE 1-0.05 % EX CREA: 1.0000 | TOPICAL_CREAM | Freq: Every day | CUTANEOUS | 0 refills | Status: DC

## 2022-01-15 NOTE — Progress Notes (Signed)
I have spent 5 minutes in review of e-visit questionnaire, review and updating patient chart, medical decision making and response to patient.   Reyana Leisey Cody Airis Barbee, PA-C    

## 2022-01-15 NOTE — Progress Notes (Signed)
E Visit for Rash  We are sorry that you are not feeling well. Here is how we plan to help!   Based upon your presentation it appears you have a fungal infection.  I have prescribed: Lotrisone cream to apply twice daily.    HOME CARE:  Take cool showers and avoid direct sunlight. Apply cool compress or wet dressings. Take a bath in an oatmeal bath.  Sprinkle content of one Aveeno packet under running faucet with comfortably warm water.  Bathe for 15-20 minutes, 1-2 times daily.  Pat dry with a towel. Do not rub the rash. Use hydrocortisone cream. Take an antihistamine like Benadryl for widespread rashes that itch.  The adult dose of Benadryl is 25-50 mg by mouth 4 times daily. Caution:  This type of medication may cause sleepiness.  Do not drink alcohol, drive, or operate dangerous machinery while taking antihistamines.  Do not take these medications if you have prostate enlargement.  Read package instructions thoroughly on all medications that you take.  GET HELP RIGHT AWAY IF:  Symptoms don't go away after treatment. Severe itching that persists. If you rash spreads or swells. If you rash begins to smell. If it blisters and opens or develops a yellow-brown crust. You develop a fever. You have a sore throat. You become short of breath.  MAKE SURE YOU:  Understand these instructions. Will watch your condition. Will get help right away if you are not doing well or get worse.  Thank you for choosing an e-visit.  Your e-visit answers were reviewed by a board certified advanced clinical practitioner to complete your personal care plan. Depending upon the condition, your plan could have included both over the counter or prescription medications.  Please review your pharmacy choice. Make sure the pharmacy is open so you can pick up prescription now. If there is a problem, you may contact your provider through [employer] and have the prescription routed to another pharmacy.  Your  safety is important to [place]. If you have drug allergies check your prescription carefully.   For the next 24 hours you can use MyChart to ask questions about today's visit, request a non-urgent call back, or ask for a work or school excuse. You will get an email in the next two days asking about your experience. I hope that your e-visit has been valuable and will speed your recovery.

## 2022-01-16 DIAGNOSIS — H1045 Other chronic allergic conjunctivitis: Secondary | ICD-10-CM | POA: Diagnosis not present

## 2022-01-16 DIAGNOSIS — J301 Allergic rhinitis due to pollen: Secondary | ICD-10-CM | POA: Diagnosis not present

## 2022-01-16 DIAGNOSIS — J3081 Allergic rhinitis due to animal (cat) (dog) hair and dander: Secondary | ICD-10-CM | POA: Diagnosis not present

## 2022-01-16 DIAGNOSIS — J4530 Mild persistent asthma, uncomplicated: Secondary | ICD-10-CM | POA: Diagnosis not present

## 2022-01-16 DIAGNOSIS — J3089 Other allergic rhinitis: Secondary | ICD-10-CM | POA: Diagnosis not present

## 2022-01-18 DIAGNOSIS — R059 Cough, unspecified: Secondary | ICD-10-CM | POA: Diagnosis not present

## 2022-01-18 DIAGNOSIS — J40 Bronchitis, not specified as acute or chronic: Secondary | ICD-10-CM | POA: Diagnosis not present

## 2022-02-06 ENCOUNTER — Encounter: Payer: Self-pay | Admitting: Gastroenterology

## 2022-02-12 ENCOUNTER — Other Ambulatory Visit: Payer: Self-pay | Admitting: Internal Medicine

## 2022-02-16 ENCOUNTER — Encounter: Payer: Self-pay | Admitting: Pharmacist

## 2022-02-16 NOTE — Progress Notes (Signed)
[facility])                                            [place] Team                                        Statin Quality Measure Assessment    02/16/2022  [name] Wright 17-Aug-1965 [id number]  Per review of chart and payor information, patient has a diagnosis of diabetes but is not currently filling a statin prescription.  This places patient into the SUPD (Statin Use In Patients with Diabetes) measure for CMS.    Patient has declined statin therapy in the past.   She has an upcoming appointment 02/20/22.  If deemed therapeutically appropriate, she could be evaluated for statin therapy.  The 10-year ASCVD risk score (Arnett DK, et al., 2019) is: 6.3%   Values used to calculate the score:     Age: 56 years     Sex: Female     Is Non-Hispanic African American: No     Diabetic: Yes     Tobacco smoker: No     Systolic Blood Pressure: 546 mmHg     Is BP treated: Yes     HDL Cholesterol: 51 mg/dL     Total Cholesterol: 179 mg/dL 10/18/2021     Component Value Date/Time   CHOL 179 10/18/2021 1624   TRIG 121 10/18/2021 1624   HDL 51 10/18/2021 1624   CHOLHDL 3.5 10/18/2021 1624   CHOLHDL 4 09/01/2015 1047   VLDL 29.8 09/01/2015 1047   LDLCALC 106 (H) 10/18/2021 1624    Please consider ONE of the following recommendations:  Initiate high intensity statin Atorvastatin '40mg'$  once daily, #90, 3 refills   Rosuvastatin '20mg'$  once daily, #90, 3 refills    Initiate moderate intensity          statin with reduced frequency if prior          statin intolerance 1x weekly, #13, 3 refills   2x weekly, #26, 3 refills   3x weekly, #39, 3 refills    Code for past statin intolerance or  other exclusions (required annually)   Provider Requirements:  Associate code during an office visit or telehealth encounter  Drug Induced Myopathy G72.0   Myopathy, unspecified G72.9   Myositis, unspecified M60.9   Rhabdomyolysis  T03.54   Alcoholic fatty liver S56.8   Cirrhosis of liver K74.69   Prediabetes R73.03   PCOS E28.2   Toxic liver disease, unspecified K71.9         Plan: route note to PCP prior to upcoming visit.  Elayne Guerin, PharmD, [place] Clinical Pharmacist [phone]

## 2022-02-18 DIAGNOSIS — J40 Bronchitis, not specified as acute or chronic: Secondary | ICD-10-CM | POA: Diagnosis not present

## 2022-02-18 DIAGNOSIS — R059 Cough, unspecified: Secondary | ICD-10-CM | POA: Diagnosis not present

## 2022-02-20 ENCOUNTER — Encounter: Payer: Medicare Other | Admitting: Internal Medicine

## 2022-02-27 ENCOUNTER — Encounter: Payer: Self-pay | Admitting: Internal Medicine

## 2022-02-27 ENCOUNTER — Ambulatory Visit (INDEPENDENT_AMBULATORY_CARE_PROVIDER_SITE_OTHER): Payer: Medicare Other | Admitting: Internal Medicine

## 2022-02-27 VITALS — BP 134/80 | HR 79 | Temp 98.3°F | Ht 66.0 in | Wt 379.6 lb

## 2022-02-27 DIAGNOSIS — I10 Essential (primary) hypertension: Secondary | ICD-10-CM | POA: Diagnosis not present

## 2022-02-27 DIAGNOSIS — Z23 Encounter for immunization: Secondary | ICD-10-CM

## 2022-02-27 DIAGNOSIS — Z0000 Encounter for general adult medical examination without abnormal findings: Secondary | ICD-10-CM

## 2022-02-27 DIAGNOSIS — E7800 Pure hypercholesterolemia, unspecified: Secondary | ICD-10-CM

## 2022-02-27 DIAGNOSIS — E538 Deficiency of other specified B group vitamins: Secondary | ICD-10-CM

## 2022-02-27 DIAGNOSIS — E1165 Type 2 diabetes mellitus with hyperglycemia: Secondary | ICD-10-CM

## 2022-02-27 DIAGNOSIS — E039 Hypothyroidism, unspecified: Secondary | ICD-10-CM | POA: Diagnosis not present

## 2022-02-27 DIAGNOSIS — E559 Vitamin D deficiency, unspecified: Secondary | ICD-10-CM | POA: Diagnosis not present

## 2022-02-27 DIAGNOSIS — E66813 Obesity, class 3: Secondary | ICD-10-CM

## 2022-02-27 DIAGNOSIS — Z6841 Body Mass Index (BMI) 40.0 and over, adult: Secondary | ICD-10-CM

## 2022-02-27 LAB — POCT URINALYSIS DIPSTICK
Blood, UA: NEGATIVE
Glucose, UA: NEGATIVE
Ketones, UA: NEGATIVE
Leukocytes, UA: NEGATIVE
Nitrite, UA: NEGATIVE
Protein, UA: NEGATIVE
Spec Grav, UA: 1.025 (ref 1.010–1.025)
Urobilinogen, UA: 0.2 E.U./dL
pH, UA: 6 (ref 5.0–8.0)

## 2022-02-27 MED ORDER — CYANOCOBALAMIN 1000 MCG/ML IJ SOLN
1000.0000 ug | Freq: Once | INTRAMUSCULAR | Status: AC
  Administered 2022-02-27: 1000 ug via INTRAMUSCULAR

## 2022-02-27 NOTE — Patient Instructions (Addendum)
The 10-year ASCVD risk score (Arnett DK, et al., 2019) is: 6.3%   Values used to calculate the score:     Age: 57 years     Sex: Female     Is Non-Hispanic African American: No     Diabetic: Yes     Tobacco smoker: No     Systolic Blood Pressure: 623 mmHg     Is BP treated: Yes     HDL Cholesterol: 51 mg/dL     Total Cholesterol: 179 mg/dL   Health Maintenance, Female Adopting a healthy lifestyle and getting preventive care are important in promoting health and wellness. Ask your health care provider about: The right schedule for you to have regular tests and exams. Things you can do on your own to prevent diseases and keep yourself healthy. What should I know about diet, weight, and exercise? Eat a healthy diet  Eat a diet that includes plenty of vegetables, fruits, low-fat dairy products, and lean protein. Do not eat a lot of foods that are high in solid fats, added sugars, or sodium. Maintain a healthy weight Body mass index (BMI) is used to identify weight problems. It estimates body fat based on height and weight. Your health care provider can help determine your BMI and help you achieve or maintain a healthy weight. Get regular exercise Get regular exercise. This is one of the most important things you can do for your health. Most adults should: Exercise for at least 150 minutes each week. The exercise should increase your heart rate and make you sweat (moderate-intensity exercise). Do strengthening exercises at least twice a week. This is in addition to the moderate-intensity exercise. Spend less time sitting. Even light physical activity can be beneficial. Watch cholesterol and blood lipids Have your blood tested for lipids and cholesterol at 55 years of age, then have this test every 5 years. Have your cholesterol levels checked more often if: Your lipid or cholesterol levels are high. You are older than 55 years of age. You are at high risk for heart disease. What should  I know about cancer screening? Depending on your health history and family history, you may need to have cancer screening at various ages. This may include screening for: Breast cancer. Cervical cancer. Colorectal cancer. Skin cancer. Lung cancer. What should I know about heart disease, diabetes, and high blood pressure? Blood pressure and heart disease High blood pressure causes heart disease and increases the risk of stroke. This is more likely to develop in people who have high blood pressure readings or are overweight. Have your blood pressure checked: Every 3-5 years if you are 42-70 years of age. Every year if you are 34 years old or older. Diabetes Have regular diabetes screenings. This checks your fasting blood sugar level. Have the screening done: Once every three years after age 92 if you are at a normal weight and have a low risk for diabetes. More often and at a younger age if you are overweight or have a high risk for diabetes. What should I know about preventing infection? Hepatitis B If you have a higher risk for hepatitis B, you should be screened for this virus. Talk with your health care provider to find out if you are at risk for hepatitis B infection. Hepatitis C Testing is recommended for: Everyone born from [birth year] through 1964. Anyone with known risk factors for hepatitis C. Sexually transmitted infections (STIs) Get screened for STIs, including gonorrhea and chlamydia, if: You are sexually active  and are younger than 55 years of age. You are older than 55 years of age and your health care provider tells you that you are at risk for this type of infection. Your sexual activity has changed since you were last screened, and you are at increased risk for chlamydia or gonorrhea. Ask your health care provider if you are at risk. Ask your health care provider about whether you are at high risk for HIV. Your health care provider may recommend a prescription medicine to help  prevent HIV infection. If you choose to take medicine to prevent HIV, you should first get tested for HIV. You should then be tested every 3 months for as long as you are taking the medicine. Pregnancy If you are about to stop having your period (premenopausal) and you may become pregnant, seek counseling before you get pregnant. Take 400 to 800 micrograms (mcg) of folic acid every day if you become pregnant. Ask for birth control (contraception) if you want to prevent pregnancy. Osteoporosis and menopause Osteoporosis is a disease in which the bones lose minerals and strength with aging. This can result in bone fractures. If you are 48 years old or older, or if you are at risk for osteoporosis and fractures, ask your health care provider if you should: Be screened for bone loss. Take a calcium or vitamin D supplement to lower your risk of fractures. Be given hormone replacement therapy (HRT) to treat symptoms of menopause. Follow these instructions at home: Alcohol use Do not drink alcohol if: Your health care provider tells you not to drink. You are pregnant, may be pregnant, or are planning to become pregnant. If you drink alcohol: Limit how much you have to: 0-1 drink a day. Know how much alcohol is in your drink. In the [place] one drink equals one 12 oz bottle of beer (355 mL), one 5 oz glass of wine (148 mL), or one 1 oz glass of hard liquor (44 mL). Lifestyle Do not use any products that contain nicotine or tobacco. These products include cigarettes, chewing tobacco, and vaping devices, such as e-cigarettes. If you need help quitting, ask your health care provider. Do not use street drugs. Do not share needles. Ask your health care provider for help if you need support or information about quitting drugs. General instructions Schedule regular health, dental, and eye exams. Stay current with your vaccines. Tell your health care provider if: You often feel depressed. You have ever  been abused or do not feel safe at home. Summary Adopting a healthy lifestyle and getting preventive care are important in promoting health and wellness. Follow your health care provider's instructions about healthy diet, exercising, and getting tested or screened for diseases. Follow your health care provider's instructions on monitoring your cholesterol and blood pressure. This information is not intended to replace advice given to you by your health care provider. Make sure you discuss any questions you have with your health care provider. Document Revised: 08/15/2020 Document Reviewed: 08/15/2020 Elsevier Patient Education  [place].

## 2022-02-27 NOTE — Progress Notes (Addendum)
Rich Brave [place]ittleton,acting as a Education administrator for Maximino Greenland, MD.,have documented all relevant documentation on the behalf of Maximino Greenland, MD,as directed by  Maximino Greenland, MD while in the presence of Maximino Greenland, MD.   Subjective:     Patient ID: [name] Wright , female    DOB: 12-Sep-1965 , 56 y.o.   MRN: [id number]   Chief Complaint  Patient presents with   Annual Exam   Diabetes   Hypertension    HPI  Patient is here for full physical exam. She states that she is compliant with medications. She has no concerns at this time.   Diabetes She presents for her follow-up diabetic visit. She has type 2 diabetes mellitus. Her disease course has been stable. There are no hypoglycemic associated symptoms. Pertinent negatives for diabetes include no blurred vision. Pertinent negatives for diabetic complications include no CVA, heart disease, peripheral neuropathy or PVD. Risk factors for coronary artery disease include diabetes mellitus, sedentary lifestyle and obesity.  Hypertension The problem is controlled. Pertinent negatives include no blurred vision. Risk factors for coronary artery disease include diabetes mellitus, dyslipidemia, obesity and sedentary lifestyle. The current treatment provides moderate improvement. Compliance problems include exercise.  There is no history of CVA or PVD.     Past Medical History:  Diagnosis Date   Acute cystitis    Allergic rhinitis    Childhood asthma    no problems as adult - no inhaler   Chronic insomnia    Chronic pain due to trauma 02/2001   closed head trauma - Followed by Dr [facility]   Complication of anesthesia    Dyspepsia    Family history of ovarian cancer    Head trauma 02/24/2001   closed   History of kidney stones    passed stone - no surgery required   Hypothyroidism    PONV (postoperative nausea and vomiting)    Pre-diabetes    SVD (spontaneous vaginal delivery)    fetal demise  at 62 months     Family History  Problem Relation Age of Onset   Multiple sclerosis Mother 48   Bladder Cancer Mother 34   Heart Problems Father    Dementia Paternal Aunt    Ovarian cancer Maternal Grandmother    Lung cancer Maternal Grandfather    Tuberculosis Paternal Grandmother      Current Outpatient Medications:    albuterol (PROAIR HFA) 108 (90 Base) MCG/ACT inhaler, Inhale 2 puffs into the lungs every 4 (four) hours as needed for wheezing or shortness of breath., Disp: 1 each, Rfl: 1   azelastine (ASTELIN) 0.1 % nasal spray, Place 2 sprays into both nostrils 2 (two) times daily. Use in each nostril as directed, Disp: 30 mL, Rfl: 2   baclofen (LIORESAL) 10 MG tablet, Take by mouth., Disp: , Rfl:    butalbital-acetaminophen-caffeine (FIORICET) 50-325-40 MG tablet, Take 1 tablet by mouth every 4 (four) hours as needed., Disp: , Rfl:    clotrimazole-betamethasone (LOTRISONE) cream, Apply 1 Application topically daily., Disp: 30 g, Rfl: 0   diphenhydrAMINE (BENADRYL) 25 mg capsule, Take 25 mg by mouth every 6 (six) hours as needed for allergies. , Disp: , Rfl:    famotidine (PEPCID) 20 MG tablet, Take 1 tablet (20 mg total) by mouth 2 (two) times daily., Disp: 60 tablet, Rfl: 1   fluticasone (FLONASE) 50 MCG/ACT nasal spray, Place 1 spray into both nostrils in the morning., Disp: , Rfl:  furosemide (LASIX) 80 MG tablet, TAKE 1 TABLET BY MOUTH ONCE DAILY AS NEEDED FOR FLUID, Disp: 90 tablet, Rfl: 0   Insulin Pen Needle (PEN NEEDLES) 32G X 6 MM MISC, 1 each by Does not apply route once a week., Disp: 30 each, Rfl: 1   levothyroxine (SYNTHROID) 100 MCG tablet, TAKE 1 TABLET BY MOUTH BEFORE BREAKFAST, Disp: 90 tablet, Rfl: 0   Magnesium 400 MG TABS, Take 1 tablet by mouth daily., Disp: , Rfl:    meclizine (ANTIVERT) 25 MG tablet, Take 12.5-25 mg by mouth 2 (two) times daily as needed for dizziness (vertigo)., Disp: , Rfl:    methocarbamol (ROBAXIN) 750 MG tablet, Take 500 mg by mouth  every 6 (six) hours as needed for muscle spasms. 1 at bedtime, Disp: , Rfl:    morphine (MSIR) 15 MG tablet, Take 15 mg by mouth 4 (four) times daily as needed for severe pain. , Disp: , Rfl:    Multiple Vitamins-Minerals (MAXIMUM DAILY GREEN PO), Take 1 Package by mouth 3 (three) times a week. It [employer]! Greens, Disp: , Rfl:    promethazine (PHENERGAN) 25 MG tablet, TAKE 1/2 TO 1 (ONE-HALF TO ONE) TABLET BY MOUTH EVERY 8 HOURS AS NEEDED FOR NAUSEA, Disp: 40 tablet, Rfl: 0   RESTASIS 0.05 % ophthalmic emulsion, Place 1 drop into both eyes 2 (two) times daily., Disp: , Rfl:    Semaglutide,0.25 or 0.5MG/DOS, (OZEMPIC, 0.25 OR 0.5 MG/DOSE,) 2 MG/1.5ML SOPN, Inject 0.5 mg into the skin once a week., Disp: 1.5 mL, Rfl: 3   SUDOGEST MAXIMUM STRENGTH 30 MG tablet, TAKE 1 TABLET BY MOUTH TWICE DAILY AS NEEDED FOR CONGESTION, Disp: 90 tablet, Rfl: 0   traMADol (ULTRAM) 50 MG tablet, TAKE 1 TABLET BY MOUTH EVERY 6 HOURS AS NEEDED, Disp: 30 tablet, Rfl: 0   empagliflozin (JARDIANCE) 10 MG TABS tablet, Take 1 tablet (10 mg total) by mouth daily before breakfast., Disp: 90 tablet, Rfl: 1   Vitamin D, Ergocalciferol, (DRISDOL) 1.25 MG (50000 UNIT) CAPS capsule, Take 1 capsule (50,000 Units total) by mouth every 7 (seven) days., Disp: 12 capsule, Rfl: 2   Allergies  Allergen Reactions   Doxycycline Anaphylaxis, Diarrhea and Nausea And Vomiting    headache   Oxycodone-Acetaminophen Hives and Nausea And Vomiting   Prochlorperazine Edisylate Other (See Comments)    Hyper and jitteriness   Amoxicillin Rash and Other (See Comments)    Has patient had a PCN reaction causing immediate rash, facial/tongue/throat swelling, SOB or lightheadedness with hypotension: Unknown Has patient had a PCN reaction causing severe rash involving mucus membranes or skin necrosis: Unknown Has patient had a PCN reaction that required hospitalization: No Has patient had a PCN reaction occurring within the last 10 years: Yes If all of  the above answers are "NO", then may proceed with Cephalosporin use.    Compazine Other (See Comments)    Hyper/jitters/blotches   Eszopiclone Other (See Comments)    Metallic taste and became un effective    Keppra [Levetiracetam] Other (See Comments)    hyperactive   Latex    Levetiracetam Other (See Comments)    Jitters/hyper   Neurontin [Gabapentin] Nausea And Vomiting   Propoxyphene Hives   Tape     PAPER TAPE-skin burns/severe irritation  PATIENT DOES NOT TOLERATE PAPER TAPE   Trileptal [Oxcarbazepine] Other (See Comments)    REACTION: immune system suppressed   Bactrim [Sulfamethoxazole-Trimethoprim] Rash   Bupropion Rash   Codeine Rash   Hydrocodone-Acetaminophen Rash  Moxifloxacin Swelling and Rash   Propoxyphene N-Acetaminophen Nausea And Vomiting and Rash   Vicodin [Hydrocodone-Acetaminophen] Rash      The patient states she uses none for birth control. Last LMP was Patient's last menstrual period was 01/25/2022.. Negative for Dysmenorrhea. Negative for: breast discharge, breast lump(s), breast pain and breast self exam. Associated symptoms include abnormal vaginal bleeding. Pertinent negatives include abnormal bleeding (hematology), anxiety, decreased libido, depression, difficulty falling sleep, dyspareunia, history of infertility, nocturia, sexual dysfunction, sleep disturbances, urinary incontinence, urinary urgency, vaginal discharge and vaginal itching. Diet regular.The patient states her exercise level is  minimal.  . The patient's tobacco use is:  Social History   Tobacco Use  Smoking Status Never  Smokeless Tobacco Never  . She has been exposed to passive smoke. The patient's alcohol use is:  Social History   Substance and Sexual Activity  Alcohol Use No   Comment: "1 drink/year"    Review of Systems  Constitutional: Negative.   HENT: Negative.    Eyes: Negative.  Negative for blurred vision.  Respiratory: Negative.    Cardiovascular: Negative.    Gastrointestinal: Negative.   Endocrine: Negative.   Genitourinary: Negative.   Musculoskeletal: Negative.   Skin: Negative.   Allergic/Immunologic: Negative.   Neurological: Negative.   Hematological: Negative.   Psychiatric/Behavioral: Negative.       Today's Vitals   02/27/22 1105  BP: 134/80  Pulse: 79  Temp: 98.3 F (36.8 C)  Weight: (!) 379 lb 9.6 oz (172.2 kg)  Height: _0  (1.676 m)  PainSc: 6   PainLoc: Leg   Body mass index is 61.27 kg/m.  Wt Readings from Last 3 Encounters:  02/27/22 (!) 379 lb 9.6 oz (172.2 kg)  10/18/21 (!) 382 lb (173.3 kg)  10/18/21 (!) 382 lb 6.4 oz (173.5 kg)     Objective:  Physical Exam Vitals and nursing note reviewed.  Constitutional:      Appearance: Normal appearance.  HENT:     Head: Normocephalic and atraumatic.     Right Ear: Tympanic membrane, ear canal and external ear normal.     Left Ear: Tympanic membrane, ear canal and external ear normal.     Nose:     Comments: Masked     Mouth/Throat:     Comments: Masked  Eyes:     Extraocular Movements: Extraocular movements intact.     Conjunctiva/sclera: Conjunctivae normal.     Pupils: Pupils are equal, round, and reactive to light.  Cardiovascular:     Rate and Rhythm: Normal rate and regular rhythm.     Pulses: Normal pulses.          Dorsalis pedis pulses are 2+ on the right side and 2+ on the left side.     Heart sounds: Normal heart sounds.  Pulmonary:     Effort: Pulmonary effort is normal.     Breath sounds: Normal breath sounds.  Chest:  Breasts:    Tanner Score is 5.     Right: Normal.     Left: Normal.  Abdominal:     General: Bowel sounds are normal.     Palpations: Abdomen is soft.     Comments: Rounded, soft, obese. Difficult to assess organomegaly due to body habitus  Genitourinary:    Comments: deferred Musculoskeletal:        General: Normal range of motion.     Cervical back: Normal range of motion and neck supple.  Feet:     Right foot:  Protective Sensation: 5 sites tested.  5 sites sensed.     Toenail Condition: Right toenails are long.     Left foot:     Protective Sensation: 5 sites tested.  5 sites sensed.     Toenail Condition: Left toenails are long.  Skin:    General: Skin is warm and dry.  Neurological:     General: No focal deficit present.     Mental Status: She is alert and oriented to person, place, and time.  Psychiatric:        Mood and Affect: Mood normal.        Behavior: Behavior normal.     Assessment And Plan:     1. Encounter for general adult medical examination w/o abnormal findings Comments: A full exam was performed. Importance of monthly self breast exams was discussed with the patient. PATIENT IS ADVISED TO GET 30-45 MINUTES REGULAR EXERCISE NO LESS THAN FOUR TO FIVE DAYS PER WEEK - BOTH WEIGHTBEARING EXERCISES AND AEROBIC ARE RECOMMENDED.  PATIENT IS ADVISED TO FOLLOW A HEALTHY DIET WITH AT LEAST SIX FRUITS/VEGGIES PER DAY, DECREASE INTAKE OF RED MEAT, AND TO INCREASE FISH INTAKE TO TWO DAYS PER WEEK.  MEATS/FISH SHOULD NOT BE FRIED, BAKED OR BROILED IS PREFERABLE.  IT IS ALSO IMPORTANT TO CUT BACK ON YOUR SUGAR INTAKE. PLEASE AVOID ANYTHING WITH ADDED SUGAR, CORN SYRUP OR OTHER SWEETENERS. IF YOU MUST USE A SWEETENER, YOU CAN TRY STEVIA. IT IS ALSO IMPORTANT TO AVOID ARTIFICIALLY SWEETENERS AND DIET BEVERAGES. LASTLY, I SUGGEST WEARING SPF 50 SUNSCREEN ON EXPOSED PARTS AND ESPECIALLY WHEN IN THE DIRECT SUNLIGHT FOR AN EXTENDED PERIOD OF TIME.  PLEASE AVOID FAST FOOD RESTAURANTS AND INCREASE YOUR WATER INTAKE.  2. Uncontrolled type 2 diabetes mellitus with hyperglycemia (HCC) Comments: Chronic, diabetic foot exam was performed. We discussed use of SGLT2-inh to help with DM and decrease cardiac risk.  I will make further suggestions once I have reviewed her labs. I DISCUSSED WITH THE PATIENT AT LENGTH REGARDING THE GOALS OF GLYCEMIC CONTROL AND POSSIBLE LONG-TERM COMPLICATIONS.  I  ALSO STRESSED  THE IMPORTANCE OF COMPLIANCE WITH HOME GLUCOSE MONITORING, DIETARY RESTRICTIONS INCLUDING AVOIDANCE OF SUGARY DRINKS/PROCESSED FOODS,  ALONG WITH REGULAR EXERCISE.  I  ALSO STRESSED THE IMPORTANCE OF ANNUAL EYE EXAMS, SELF FOOT CARE AND COMPLIANCE WITH OFFICE VISITS.  - CBC - CMP14+EGFR - Hemoglobin A1c  3. Essential hypertension, benign Comments: Chronic, fair control. She is no longer on ARB therapy, states losartan caused her to have headaches. Importance of renal protection in setting of diabetes was also d/w patient. EKG performed, NSR w/o acute changes. - POCT Urinalysis Dipstick (81002) - Microalbumin / Creatinine Urine Ratio - EKG 12-Lead  4. Pure hypercholesterolemia Comments: July 2023 results reviewed, LDL 106. She is aware LDL goal<70 for those with diabetes, she declines statin therapy.  5. Primary hypothyroidism Comments: Chronic, I will check thyroid panel and adjust meds as needed. - TSH + free T4  6. Vitamin D deficiency Comments: I will check a vitamin D level and supplement as needed. - Vitamin D (25 hydroxy)  7. Vitamin B12 deficiency Comments: I will check vitamin B12 level  today. She was also given vitamin B12 IM x 1. - Vitamin B12 - cyanocobalamin (VITAMIN B12) injection 1,000 mcg  8. Class 3 severe obesity due to excess calories with serious comorbidity and body mass index (BMI) of 60.0 to 69.9 in adult [facility]) Comments: She is encouraged to gradually increase her daily routine, while initially striving  to lose ten percent of her body weight to decrease cardiac risk.  9. Immunization due Comments: Tdap was given, this will be billed via [employer]. - Tdap vaccine greater than or equal to 7yo IM   Patient was given opportunity to ask questions. Patient verbalized understanding of the plan and was able to repeat key elements of the plan. All questions were answered to their satisfaction.   I, Maximino Greenland, MD, have reviewed all documentation for this  visit. The documentation on 02/27/22 for the exam, diagnosis, procedures, and orders are all accurate and complete.   THE PATIENT IS ENCOURAGED TO PRACTICE SOCIAL DISTANCING DUE TO THE COVID-19 PANDEMIC.

## 2022-02-28 LAB — CBC
Hematocrit: 42.1 % (ref 34.0–46.6)
Hemoglobin: 14.4 g/dL (ref 11.1–15.9)
MCH: 30.8 pg (ref 26.6–33.0)
MCHC: 34.2 g/dL (ref 31.5–35.7)
MCV: 90 fL (ref 79–97)
Platelets: 327 10*3/uL (ref 150–450)
RBC: 4.68 x10E6/uL (ref 3.77–5.28)
RDW: 12.7 % (ref 11.7–15.4)
WBC: 6.6 10*3/uL (ref 3.4–10.8)

## 2022-02-28 LAB — CMP14+EGFR
ALT: 31 IU/L (ref 0–32)
AST: 30 IU/L (ref 0–40)
Albumin/Globulin Ratio: 1.4 (ref 1.2–2.2)
Albumin: 4.2 g/dL (ref 3.8–4.9)
Alkaline Phosphatase: 118 IU/L (ref 44–121)
BUN/Creatinine Ratio: 14 (ref 9–23)
BUN: 10 mg/dL (ref 6–24)
Bilirubin Total: 0.4 mg/dL (ref 0.0–1.2)
CO2: 24 mmol/L (ref 20–29)
Calcium: 9.4 mg/dL (ref 8.7–10.2)
Chloride: 102 mmol/L (ref 96–106)
Creatinine, Ser: 0.71 mg/dL (ref 0.57–1.00)
Globulin, Total: 3 g/dL (ref 1.5–4.5)
Glucose: 130 mg/dL — ABNORMAL HIGH (ref 70–99)
Potassium: 4.4 mmol/L (ref 3.5–5.2)
Sodium: 142 mmol/L (ref 134–144)
Total Protein: 7.2 g/dL (ref 6.0–8.5)
eGFR: 100 mL/min/{1.73_m2} (ref >59)

## 2022-02-28 LAB — MICROALBUMIN / CREATININE URINE RATIO
Creatinine, Urine: 175.4 mg/dL
Microalb/Creat Ratio: 12 mg/g creat (ref 0–29)
Microalbumin, Urine: 20.5 ug/mL

## 2022-02-28 LAB — VITAMIN D 25 HYDROXY (VIT D DEFICIENCY, FRACTURES): Vit D, 25-Hydroxy: 28.9 ng/mL — ABNORMAL LOW (ref 30.0–100.0)

## 2022-02-28 LAB — HEMOGLOBIN A1C
Est. average glucose Bld gHb Est-mCnc: 163 mg/dL
Hgb A1c MFr Bld: 7.3 % — ABNORMAL HIGH (ref 4.8–5.6)

## 2022-02-28 LAB — VITAMIN B12: Vitamin B-12: 513 pg/mL (ref 232–1245)

## 2022-02-28 LAB — TSH+FREE T4
Free T4: 1.5 ng/dL (ref 0.82–1.77)
TSH: 1.4 u[IU]/mL (ref 0.450–4.500)

## 2022-03-02 ENCOUNTER — Encounter: Payer: Self-pay | Admitting: Internal Medicine

## 2022-03-02 ENCOUNTER — Other Ambulatory Visit: Payer: Self-pay | Admitting: Internal Medicine

## 2022-03-02 DIAGNOSIS — E559 Vitamin D deficiency, unspecified: Secondary | ICD-10-CM

## 2022-03-02 MED ORDER — VITAMIN D (ERGOCALCIFEROL) 1.25 MG (50000 UNIT) PO CAPS: 50000.0000 [IU] | ORAL_CAPSULE | ORAL | 2 refills | Status: DC

## 2022-03-06 ENCOUNTER — Encounter: Payer: Self-pay | Admitting: Internal Medicine

## 2022-03-06 ENCOUNTER — Other Ambulatory Visit: Payer: Self-pay

## 2022-03-06 MED ORDER — EMPAGLIFLOZIN 10 MG PO TABS: 10.0000 mg | ORAL_TABLET | Freq: Every day | ORAL | 1 refills | Status: DC

## 2022-03-06 NOTE — Addendum Note (Signed)
Addended by: Maximino Greenland on: 03/06/2022 11:35 PM   Modules accepted: Level of Service

## 2022-03-20 DIAGNOSIS — J40 Bronchitis, not specified as acute or chronic: Secondary | ICD-10-CM | POA: Diagnosis not present

## 2022-03-20 DIAGNOSIS — R059 Cough, unspecified: Secondary | ICD-10-CM | POA: Diagnosis not present

## 2022-03-29 ENCOUNTER — Telehealth: Payer: Self-pay

## 2022-03-29 NOTE — Chronic Care Management (AMB) (Signed)
Faxed 2024 re-enrollment application to [employer] patient assistance for Ozempic.   Pattricia Boss, [facility] Pharmacist Assistant [phone]

## 2022-04-15 ENCOUNTER — Telehealth: Payer: Medicare Other | Admitting: Family

## 2022-04-15 DIAGNOSIS — J4521 Mild intermittent asthma with (acute) exacerbation: Secondary | ICD-10-CM | POA: Diagnosis not present

## 2022-04-15 MED ORDER — BENZONATATE 100 MG PO CAPS: 100.0000 mg | ORAL_CAPSULE | Freq: Three times a day (TID) | ORAL | 0 refills | Status: DC | PRN

## 2022-04-15 MED ORDER — PREDNISONE 20 MG PO TABS: 40.0000 mg | ORAL_TABLET | Freq: Every day | ORAL | 0 refills | Status: AC

## 2022-04-15 MED ORDER — CETIRIZINE HCL 10 MG PO TABS: 10.0000 mg | ORAL_TABLET | Freq: Every day | ORAL | 0 refills | Status: AC

## 2022-04-15 NOTE — Progress Notes (Signed)
Visit for Asthma  Based on what you have shared with me, it looks like you may have a flare up of your asthma.  Asthma is a chronic (ongoing) lung disease which results in airway obstruction, inflammation and hyper-responsiveness.   Asthma symptoms vary from person to person, with common symptoms including nighttime awakening and decreased ability to participate in normal activities as a result of shortness of breath. It is often triggered by changes in weather, changes in the season, changes in air temperature, or inside (home, school, daycare or work) allergens such as animal dander, mold, mildew, woodstoves or cockroaches.   It can also be triggered by hormonal changes, extreme emotion, physical exertion or an upper respiratory tract illness.     It is important to identify the trigger, and then eliminate or avoid the trigger if possible.   If you have been prescribed medications to be taken on a regular basis, it is important to follow the asthma action plan and to follow guidelines to adjust medication in response to increasing symptoms of decreased peak expiratory flow rate  Treatment: I have prescribed: Prednisone '40mg'$  by mouth per day for 5  days, tessalon you can take three times a day as needed for cough, and zyrtec 10 mg. Continue your Flonase.   HOME CARE Only take medications as instructed by your medical team. Consider wearing a mask or scarf to improve breathing air temperature have been shown to decrease irritation and decrease exacerbations Get rest. Taking a steamy shower or using a humidifier may help nasal congestion sand ease sore throat pain. You can place a towel over your head and breathe in the steam from hot water coming from a faucet. Using a saline nasal spray works much the same way.  Cough drops, hare candies and sore throat lozenges may ease your cough.   Avoid close contacts especially the very you and the elderly Cover your mouth if you cough or sneeze Always remember to wash your hands.    GET HELP RIGHT AWAY IF: You develop worsening symptoms; breathlessness at rest, drowsy, confused or agitated, unable to speak in full sentences You have coughing fits You develop a severe headache or visual changes You develop shortness of breath, difficulty breathing or start having chest pain Your symptoms persist after you have completed your treatment plan If your symptoms do not improve within 10 days  MAKE SURE YOU Understand these instructions. Will watch your condition. Will get help right away if you are not doing well or get worse.   Your e-visit answers were reviewed by a board certified advanced clinical practitioner to complete your personal care plan, Depending upon the condition, your plan could have included both over the counter or prescription medications.   Please review your pharmacy choice. Your safety is important to [place]. If you have drug allergies check your prescription carefully.  You can use MyChart to ask questions about today's visit, request a non-urgent  call back, or ask for a work or school excuse for 24 hours related to this e-Visit. If it has been greater than 24 hours you will need to follow up with your provider, or enter a new e-Visit to address those concerns.   You will get an e-mail in the next two days asking about your experience. I hope that your e-visit has been valuable and will speed your recovery. Thank you for using e-visits.  Approximately 5 minutes was spent documenting and reviewing patient's chart.

## 2022-04-17 ENCOUNTER — Encounter: Payer: Self-pay | Admitting: Nurse Practitioner

## 2022-04-17 ENCOUNTER — Ambulatory Visit (INDEPENDENT_AMBULATORY_CARE_PROVIDER_SITE_OTHER): Payer: Medicare Other | Admitting: Nurse Practitioner

## 2022-04-17 ENCOUNTER — Other Ambulatory Visit: Payer: Self-pay

## 2022-04-17 VITALS — BP 138/90 | HR 86 | Temp 98.1°F | Ht 66.0 in | Wt 379.2 lb

## 2022-04-17 DIAGNOSIS — I10 Essential (primary) hypertension: Secondary | ICD-10-CM | POA: Diagnosis not present

## 2022-04-17 DIAGNOSIS — M7989 Other specified soft tissue disorders: Secondary | ICD-10-CM

## 2022-04-17 DIAGNOSIS — H6690 Otitis media, unspecified, unspecified ear: Secondary | ICD-10-CM | POA: Diagnosis not present

## 2022-04-17 DIAGNOSIS — J0101 Acute recurrent maxillary sinusitis: Secondary | ICD-10-CM

## 2022-04-17 DIAGNOSIS — Z6841 Body Mass Index (BMI) 40.0 and over, adult: Secondary | ICD-10-CM

## 2022-04-17 IMAGING — US US ABDOMEN LIMITED
1 series · 13 of 25 positions shown · non-contrast
Comparison: None.

CLINICAL DATA: Right upper quadrant abdominal pain

EXAM:
ULTRASOUND ABDOMEN LIMITED RIGHT UPPER QUADRANT

[Series 1: us abdomen limited · 0.30mm/px · 13 of 30 slices shown]
[im 1/30]
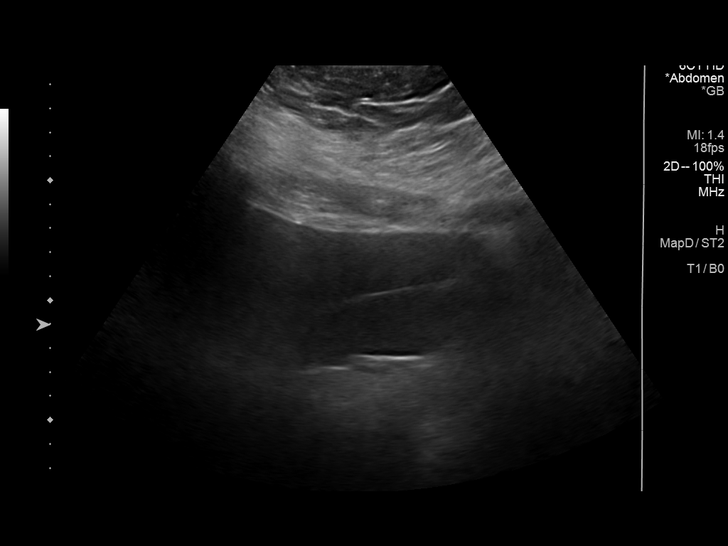
[im 3/30]
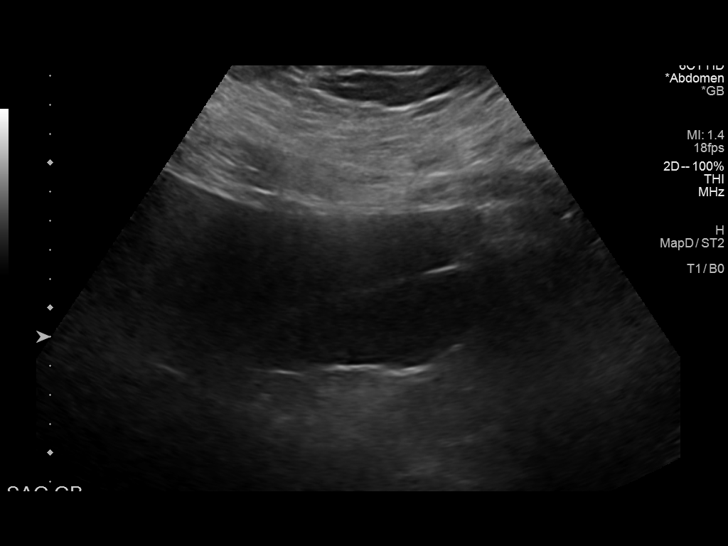
[im 5/30]
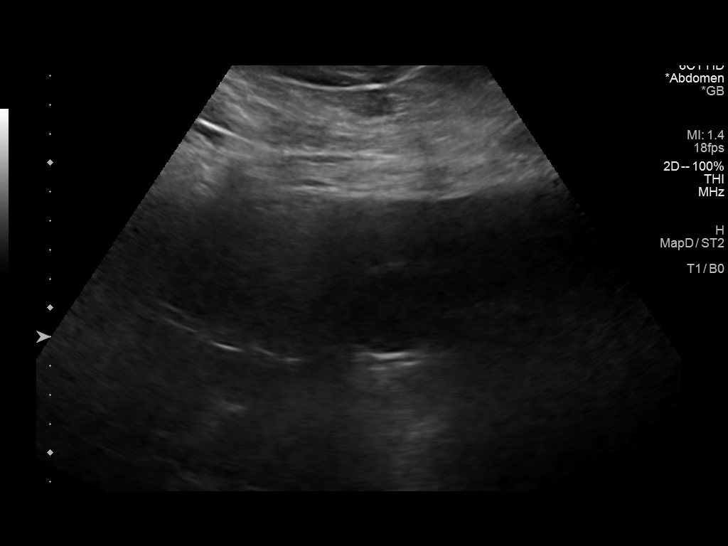
[im 8/30]
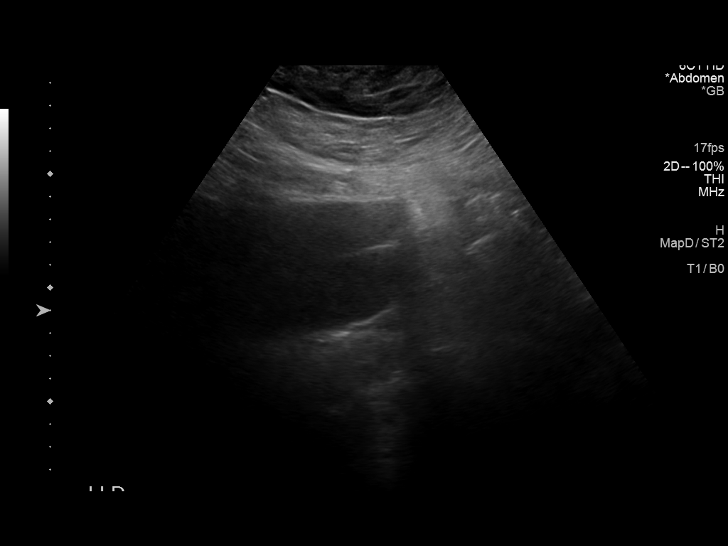
[im 10/30]
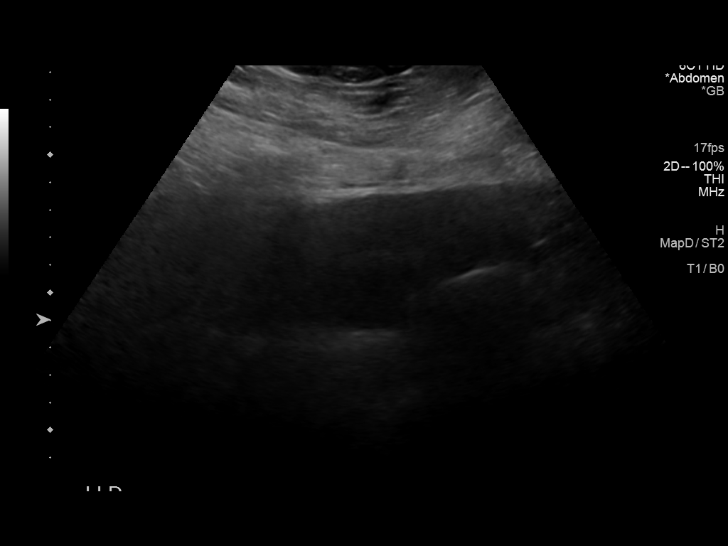
[im 13/30]
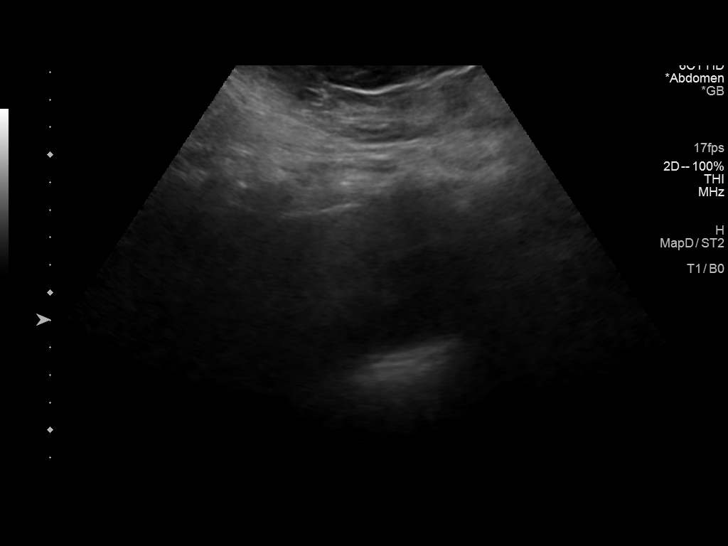
[im 15/30]
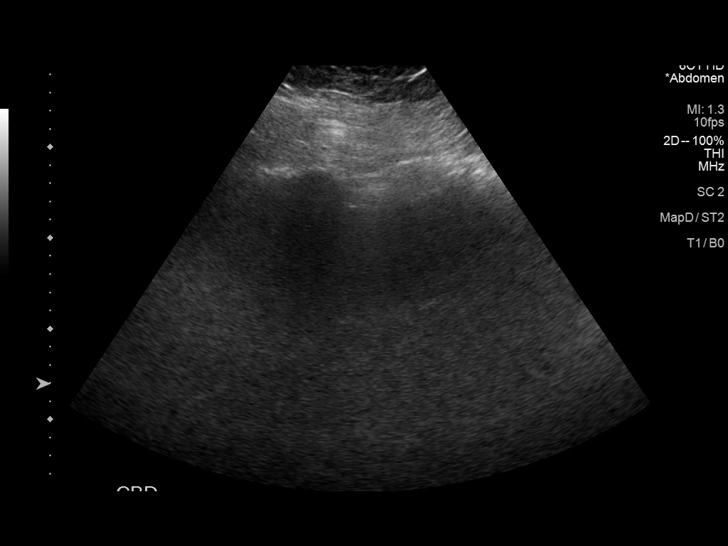
[im 17/30]
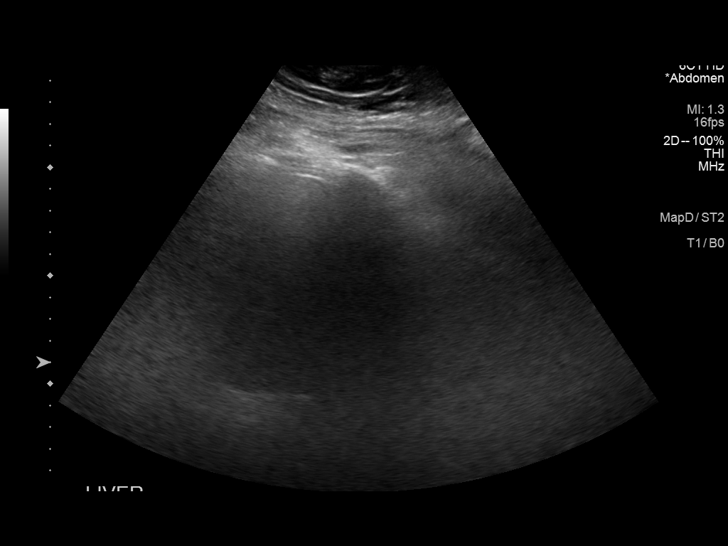
[im 20/30]
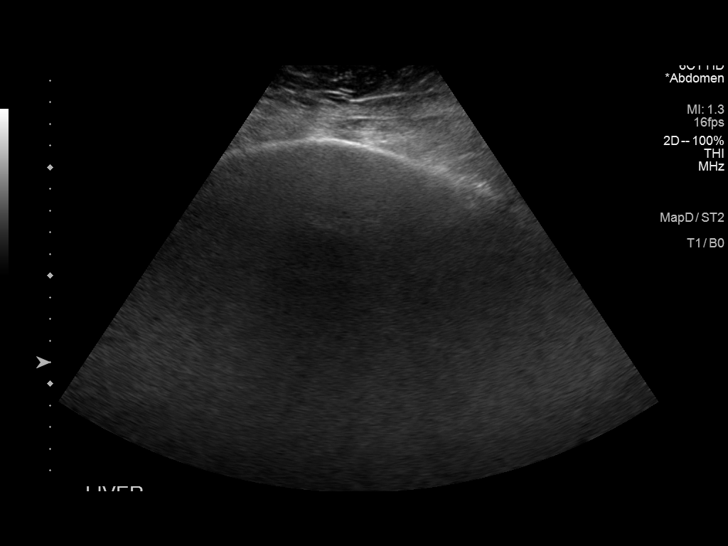
[im 22/30]
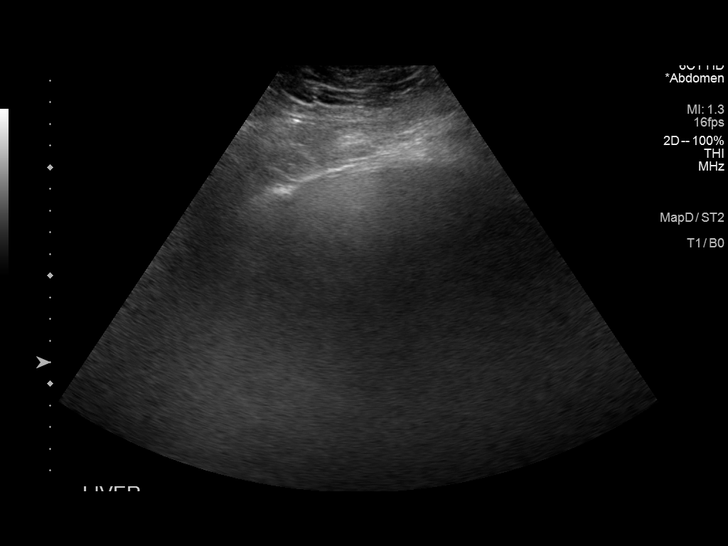
[im 25/30]
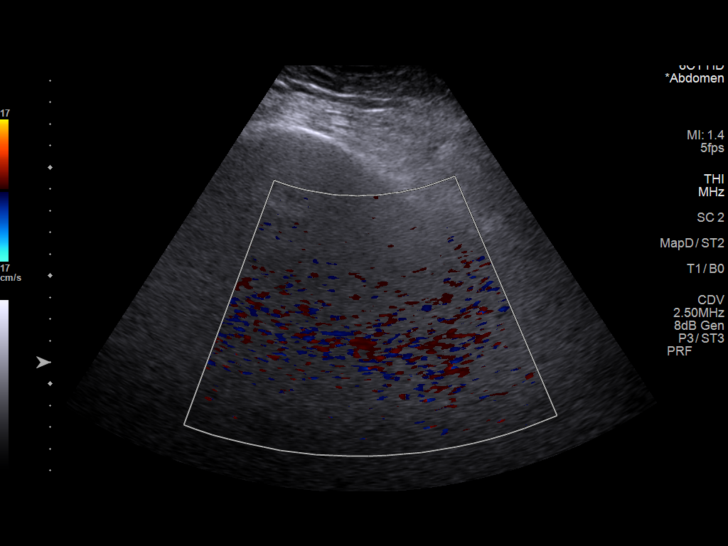
[im 27/30]
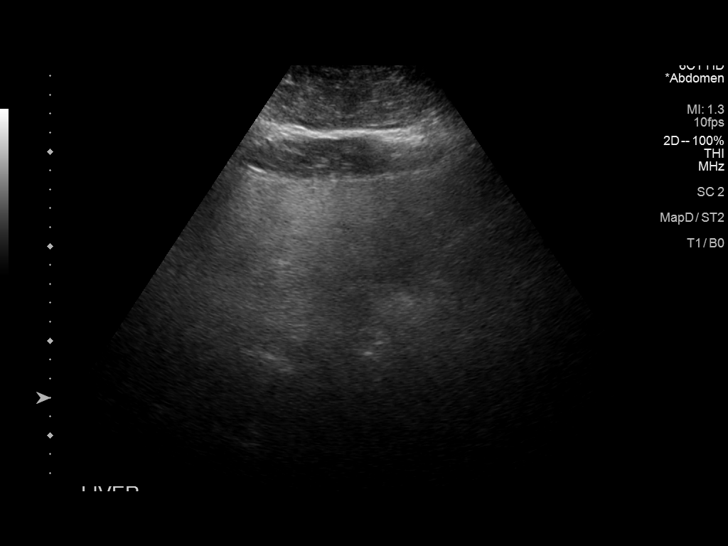
[im 30/30]
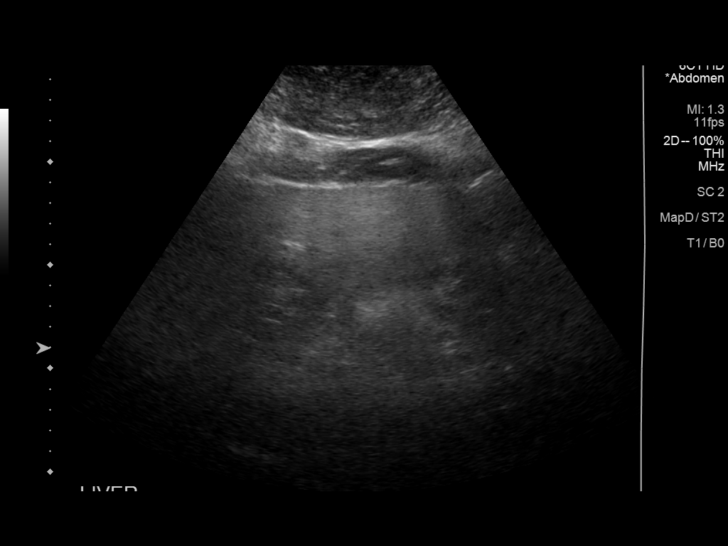

[13 of 25 positions shown; findings below may reference images not displayed]

FINDINGS: The examination is markedly limited by the patient's body habitus,
overlying osseous structures within the right upper quadrant, and
poor acoustic through transmission.

Gallbladder:

The gallbladder is partially visualized, though the peripheral
fundus is obscured by overlying bowel gas and the neck is not well
visualized on this examination. The visualized gallbladder, however,
is unremarkable; no stones or sludge is identified. There is no
gallbladder wall thickening or pericholecystic fluid identified. The
sonographic Murphy sign is reportedly negative.

Common bile duct:

Not visualized

Liver:

Limited images of the left hepatic lobe demonstrate a congenitally
small left hepatic lobe with diffusely increased parenchymal
echogenicity most in keeping with least moderate hepatic steatosis.
No intrahepatic biliary ductal dilation or focal intrahepatic masses
identified within the left hepatic lobe. Limited images of the right
hepatic lobe are essentially nondiagnostic. The portal vein is not
identified.

Other: None.
IMPRESSION: Markedly limited examination, due to the patient's body habitus,
with unremarkable visualized portion of the gallbladder and at least
moderate hepatic steatosis involving the visualized left hepatic
lobe.

## 2022-04-17 MED ORDER — FUROSEMIDE 80 MG PO TABS: ORAL_TABLET | ORAL | 0 refills | Status: DC

## 2022-04-17 MED ORDER — LEVOFLOXACIN 750 MG PO TABS: 750.0000 mg | ORAL_TABLET | Freq: Every day | ORAL | 0 refills | Status: AC

## 2022-04-17 MED ORDER — CIPROFLOXACIN-DEXAMETHASONE 0.3-0.1 % OT SUSP: 4.0000 [drp] | Freq: Two times a day (BID) | OTIC | 0 refills | Status: DC

## 2022-04-17 NOTE — Progress Notes (Signed)
I,Victoria T Hamilton,acting as a Education administrator for [name], FNP.,have documented all relevant documentation on the behalf of [name], FNP,as directed by  [name], FNP while in the presence of [name], [place].    Subjective:     Patient ID: [name] Wright , female    DOB: 08/11/1965 , 57 y.o.   MRN: [id number]   Chief Complaint  Patient presents with   Sinus Problem    HPI  Pt presents today for possible sinus infection. She reports this issue lasting for 10 days.  She experiences coughing up mucus brownish green in color wit headache from coughing and sinus pressure.  She states having a reaction to robitussin DM brand and equate version, hives appeared on her face and feet. Same thing with Mucinex DM. She is concerned about the dye. She had also been taking 50 mg of benadryl with the medications.  She has been taking benadryl, sudafed, ibuprofen, nasal spray and zyrtec. She has been drinking 6-7 bottles of water a day but feels like she is dehydrated.   Her husband she states was diagnosed with a sinus infection. He was given Levaquin, she took 3 of his pills which helped a little.   She did an evisit over this past weekend, she was prescribed prednisone, she has just started prednisone (she will have a cortisone flush). She is unsure if the steroid injection. She has not started the medication.    Sinus Problem This is a new problem. The current episode started 1 to 4 weeks ago (10 days ago). There has been no fever. Associated symptoms include congestion, coughing and sinus pressure. Pertinent negatives include no ear pain, headaches or shortness of breath.     Past Medical History:  Diagnosis Date   Acute cystitis    Allergic rhinitis    Childhood asthma    no problems as adult - no inhaler   Chronic insomnia    Chronic pain due to trauma 02/2001   closed head trauma - Followed by Dr [facility]   Complication of anesthesia     Dyspepsia    Family history of ovarian cancer    Head trauma 02/24/2001   closed   History of kidney stones    passed stone - no surgery required   Hypothyroidism    PONV (postoperative nausea and vomiting)    Pre-diabetes    SVD (spontaneous vaginal delivery)    fetal demise at 47 months     Family History  Problem Relation Age of Onset   Multiple sclerosis Mother 13   Bladder Cancer Mother 78   Heart Problems Father    Dementia Paternal Aunt    Ovarian cancer Maternal Grandmother    Lung cancer Maternal Grandfather    Tuberculosis Paternal Grandmother      Current Outpatient Medications:    albuterol (PROAIR HFA) 108 (90 Base) MCG/ACT inhaler, Inhale 2 puffs into the lungs every 4 (four) hours as needed for wheezing or shortness of breath., Disp: 1 each, Rfl: 1   azelastine (ASTELIN) 0.1 % nasal spray, Place 2 sprays into both nostrils 2 (two) times daily. Use in each nostril as directed, Disp: 30 mL, Rfl: 2   baclofen (LIORESAL) 10 MG tablet, Take by mouth., Disp: , Rfl:    butalbital-acetaminophen-caffeine (FIORICET) 50-325-40 MG tablet, Take 1 tablet by mouth every 4 (four) hours as needed., Disp: , Rfl:    cetirizine (ZYRTEC ALLERGY) 10 MG tablet, Take 1 tablet (10  mg total) by mouth daily., Disp: 90 tablet, Rfl: 0   ciprofloxacin-dexamethasone (CIPRODEX) OTIC suspension, Place 4 drops into the right ear 2 (two) times daily., Disp: 7.5 mL, Rfl: 0   diphenhydrAMINE (BENADRYL) 25 mg capsule, Take 25 mg by mouth every 6 (six) hours as needed for allergies. , Disp: , Rfl:    empagliflozin (JARDIANCE) 10 MG TABS tablet, Take 1 tablet (10 mg total) by mouth daily before breakfast., Disp: 90 tablet, Rfl: 1   fluticasone (FLONASE) 50 MCG/ACT nasal spray, Place 1 spray into both nostrils in the morning., Disp: , Rfl:    Insulin Pen Needle (PEN NEEDLES) 32G X 6 MM MISC, 1 each by Does not apply route once a week., Disp: 30 each, Rfl: 1   levofloxacin (LEVAQUIN) 750 MG tablet, Take 1  tablet (750 mg total) by mouth daily for 10 days., Disp: 10 tablet, Rfl: 0   levothyroxine (SYNTHROID) 100 MCG tablet, TAKE 1 TABLET BY MOUTH BEFORE BREAKFAST, Disp: 90 tablet, Rfl: 0   Magnesium 400 MG TABS, Take 1 tablet by mouth daily., Disp: , Rfl:    meclizine (ANTIVERT) 25 MG tablet, Take 12.5-25 mg by mouth 2 (two) times daily as needed for dizziness (vertigo)., Disp: , Rfl:    methocarbamol (ROBAXIN) 750 MG tablet, Take 500 mg by mouth every 6 (six) hours as needed for muscle spasms. 1 at bedtime, Disp: , Rfl:    morphine (MSIR) 15 MG tablet, Take 15 mg by mouth 4 (four) times daily as needed for severe pain. , Disp: , Rfl:    Multiple Vitamins-Minerals (MAXIMUM DAILY GREEN PO), Take 1 Package by mouth 3 (three) times a week. It [employer]! Greens, Disp: , Rfl:    predniSONE (DELTASONE) 20 MG tablet, Take 2 tablets (40 mg total) by mouth daily with breakfast for 5 days., Disp: 10 tablet, Rfl: 0   promethazine (PHENERGAN) 25 MG tablet, TAKE 1/2 TO 1 (ONE-HALF TO ONE) TABLET BY MOUTH EVERY 8 HOURS AS NEEDED FOR NAUSEA, Disp: 40 tablet, Rfl: 0   RESTASIS 0.05 % ophthalmic emulsion, Place 1 drop into both eyes 2 (two) times daily., Disp: , Rfl:    Semaglutide,0.25 or 0.'5MG'$ /DOS, (OZEMPIC, 0.25 OR 0.5 MG/DOSE,) 2 MG/1.5ML SOPN, Inject 0.5 mg into the skin once a week., Disp: 1.5 mL, Rfl: 3   SUDOGEST MAXIMUM STRENGTH 30 MG tablet, TAKE 1 TABLET BY MOUTH TWICE DAILY AS NEEDED FOR CONGESTION, Disp: 90 tablet, Rfl: 0   Vitamin D, Ergocalciferol, (DRISDOL) 1.25 MG (50000 UNIT) CAPS capsule, Take 1 capsule (50,000 Units total) by mouth every 7 (seven) days., Disp: 12 capsule, Rfl: 2   benzonatate (TESSALON PERLES) 100 MG capsule, Take 1 capsule (100 mg total) by mouth 3 (three) times daily as needed., Disp: 20 capsule, Rfl: 0   clotrimazole-betamethasone (LOTRISONE) cream, Apply 1 Application topically daily., Disp: 30 g, Rfl: 0   famotidine (PEPCID) 20 MG tablet, Take 1 tablet (20 mg total) by mouth 2 (two)  times daily., Disp: 60 tablet, Rfl: 1   furosemide (LASIX) 80 MG tablet, TAKE 1 TABLET BY MOUTH ONCE DAILY AS NEEDED FOR FLUID, Disp: 90 tablet, Rfl: 0   traMADol (ULTRAM) 50 MG tablet, TAKE 1 TABLET BY MOUTH EVERY 6 HOURS AS NEEDED, Disp: 30 tablet, Rfl: 0   Allergies  Allergen Reactions   Doxycycline Anaphylaxis, Diarrhea and Nausea And Vomiting    headache   Oxycodone-Acetaminophen Hives and Nausea And Vomiting   Prochlorperazine Edisylate Other (See Comments)    Hyper and  jitteriness   Amoxicillin Rash and Other (See Comments)    Has patient had a PCN reaction causing immediate rash, facial/tongue/throat swelling, SOB or lightheadedness with hypotension: Unknown Has patient had a PCN reaction causing severe rash involving mucus membranes or skin necrosis: Unknown Has patient had a PCN reaction that required hospitalization: No Has patient had a PCN reaction occurring within the last 10 years: Yes If all of the above answers are "NO", then may proceed with Cephalosporin use.    Compazine Other (See Comments)    Hyper/jitters/blotches   Eszopiclone Other (See Comments)    Metallic taste and became un effective    Keppra [Levetiracetam] Other (See Comments)    hyperactive   Latex    Levetiracetam Other (See Comments)    Jitters/hyper   Neurontin [Gabapentin] Nausea And Vomiting   Propoxyphene Hives   Robitussin Dm Max Day-Night Hives   Tape     PAPER TAPE-skin burns/severe irritation  PATIENT DOES NOT TOLERATE PAPER TAPE   Trileptal [Oxcarbazepine] Other (See Comments)    REACTION: immune system suppressed   Bactrim [Sulfamethoxazole-Trimethoprim] Rash   Bupropion Rash   Codeine Rash   Hydrocodone-Acetaminophen Rash   Moxifloxacin Swelling and Rash   Propoxyphene N-Acetaminophen Nausea And Vomiting and Rash   Vicodin [Hydrocodone-Acetaminophen] Rash     Review of Systems  Constitutional: Negative.   HENT:  Positive for congestion and sinus pressure. Negative for ear  pain.   Respiratory:  Positive for cough. Negative for shortness of breath.   Cardiovascular: Negative.   Neurological: Negative.  Negative for headaches.  Psychiatric/Behavioral: Negative.       Today's Vitals   04/17/22 1053 04/17/22 1152  BP: (!) 148/100 (!) 138/90  Pulse: 86   Temp: 98.1 F (36.7 C)   SpO2: 98%   Weight: (!) 379 lb 3.2 oz (172 kg)   Height: '5\' 6"'$  (1.676 m)    Body mass index is 61.2 kg/m.  Wt Readings from Last 3 Encounters:  04/17/22 (!) 379 lb 3.2 oz (172 kg)  02/27/22 (!) 379 lb 9.6 oz (172.2 kg)  10/18/21 (!) 382 lb (173.3 kg)    Objective:  Physical Exam Vitals reviewed.  Constitutional:      General: She is not in acute distress.    Appearance: Normal appearance.  Cardiovascular:     Rate and Rhythm: Normal rate and regular rhythm.     Pulses: Normal pulses.     Heart sounds: Normal heart sounds. No murmur heard. Pulmonary:     Effort: Pulmonary effort is normal. No respiratory distress.     Breath sounds: Normal breath sounds. No wheezing.  Skin:    General: Skin is warm and dry.     Capillary Refill: Capillary refill takes less than 2 seconds.  Neurological:     General: No focal deficit present.     Mental Status: She is alert and oriented to person, place, and time.     Cranial Nerves: No cranial nerve deficit.     Motor: No weakness.         Assessment And Plan:     1. Acute recurrent maxillary sinusitis Comments: Eenderness to maxillary sinuses. Will treat with Levaquin.  Advised to not take other patient's medications and to sure to complete full regimen of antibiotics - levofloxacin (LEVAQUIN) 750 MG tablet; Take 1 tablet (750 mg total) by mouth daily for 10 days.  Dispense: 10 tablet; Refill: 0  2. Acute otitis media, unspecified otitis media type Comments: Right  ear canal is erythematous.  Will treat with eardrops due to multiple medication sensitivities - ciprofloxacin-dexamethasone (CIPRODEX) OTIC suspension; Place 4  drops into the right ear 2 (two) times daily.  Dispense: 7.5 mL; Refill: 0  3. Elevated blood pressure reading with diagnosis of hypertension Comments: Blood pressure is elevated, repeat is improved and it was still at elevated.  May be related to take being Sudafed advised to wait taking this medication.  4. Class 3 severe obesity due to excess calories with serious comorbidity and body mass index (BMI) of 60.0 to 69.9 in adult [facility]) She is encouraged to strive for BMI less than 30 to decrease cardiac risk. Advised to aim for at least 150 minutes of exercise per week.    Patient was given opportunity to ask questions. Patient verbalized understanding of the plan and was able to repeat key elements of the plan. All questions were answered to their satisfaction.  [name], FNP   I, [name], FNP, have reviewed all documentation for this visit. The documentation on 04/17/22 for the exam, diagnosis, procedures, and orders are all accurate and complete.   IF YOU HAVE BEEN REFERRED TO A SPECIALIST, IT MAY TAKE 1-2 WEEKS TO SCHEDULE/PROCESS THE REFERRAL. IF YOU HAVE NOT HEARD FROM US/SPECIALIST IN TWO WEEKS, PLEASE GIVE [place] A CALL AT [phone].   THE PATIENT IS ENCOURAGED TO PRACTICE SOCIAL DISTANCING DUE TO THE COVID-19 PANDEMIC.

## 2022-04-17 NOTE — Patient Instructions (Signed)

## 2022-04-20 ENCOUNTER — Ambulatory Visit: Payer: Medicare Other

## 2022-04-20 DIAGNOSIS — R059 Cough, unspecified: Secondary | ICD-10-CM | POA: Diagnosis not present

## 2022-04-20 NOTE — Progress Notes (Signed)
Care Management & Coordination Services Pharmacy Note  04/20/2022 Name:  [name] Wright MRN:  [id number] DOB:  1965-05-26  Summary: Patient reports that they both have to keep a schedule. She  has also started taking Ozempic 0.5 mg once per week with out an issue.    Recommendations/Changes made from today's visit: Recommend statin intolerance is added to patients chart using a telephone encounter or during patents next office visit, G72.0, due to patents statin intolerance.  Recommend the patient be prescribed a glucose meter.   Follow up plan: Her goal is in the next couple of months she would like to make sure 3 dinners per week she is making at home.  2. To walk outside even to the mailbox everyday for the next three months.  3. Ms. Eustaquio Maize said that she is going to start checking her BS once per day will collaborate with PCP to send in glucometer.   Subjective: [name] Wright is an 57 y.o. year old female who is a primary patient of [name], MD.  The care coordination team was consulted for assistance with disease management and care coordination needs.    Engaged with patient by telephone for follow up visit.  Recent office visits: 04/17/2022 PCP OV  02/27/2022 PCP [facility] visits: None in previous 6 months   Objective:  Lab Results  Component Value Date   CREATININE 0.71 02/27/2022   BUN 10 02/27/2022   GFR 99.36 07/14/2020   EGFR 100 02/27/2022   GFRNONAA 79 11/10/2019   GFRAA 91 11/10/2019   NA 142 02/27/2022   K 4.4 02/27/2022   CALCIUM 9.4 02/27/2022   CO2 24 02/27/2022   GLUCOSE 130 (H) 02/27/2022    Lab Results  Component Value Date/Time   HGBA1C 7.3 (H) 02/27/2022 12:04 PM   HGBA1C 6.4 (H) 10/18/2021 04:24 PM   GFR 99.36 07/14/2020 11:41 AM   GFR 101.32 09/01/2015 10:47 AM   MICROALBUR 10 02/07/2021 12:22 PM   MICROALBUR 10 10/05/2020 11:29 AM    Last diabetic Eye exam:  Lab Results  Component Value Date/Time   HMDIABEYEEXA  No Retinopathy 03/21/2021 12:00 AM    Last diabetic Foot exam: No results found for: "HMDIABFOOTEX"   Lab Results  Component Value Date   CHOL 179 10/18/2021   HDL 51 10/18/2021   LDLCALC 106 (H) 10/18/2021   TRIG 121 10/18/2021   CHOLHDL 3.5 10/18/2021       Latest Ref Rng & Units 02/27/2022   12:04 PM 10/18/2021    4:24 PM 02/07/2021   12:07 PM  Hepatic Function  Total Protein 6.0 - 8.5 g/dL 7.2  7.3  7.5   Albumin 3.8 - 4.9 g/dL 4.2  4.3  4.0   AST 0 - 40 IU/L '30  31  29   '$ ALT 0 - 32 IU/L '31  29  26   '$ Alk Phosphatase 44 - 121 IU/L 118  110  118   Total Bilirubin 0.0 - 1.2 mg/dL 0.4  0.3  0.5     Lab Results  Component Value Date/Time   TSH 1.400 02/27/2022 12:04 PM   TSH 1.220 10/02/2021 02:44 PM   FREET4 1.50 02/27/2022 12:04 PM   FREET4 1.47 10/05/2020 11:22 AM       Latest Ref Rng & Units 02/27/2022   12:04 PM 02/07/2021   12:07 PM 07/14/2020   11:41 AM  CBC  WBC 3.4 - 10.8 x10E3/uL 6.6  7.9  7.9   Hemoglobin  11.1 - 15.9 g/dL 14.4  14.3  13.7   Hematocrit 34.0 - 46.6 % 42.1  41.8  40.7   Platelets 150 - 450 x10E3/uL 327  312  283.0     Lab Results  Component Value Date/Time   VD25OH 28.9 (L) 02/27/2022 12:04 PM   VD25OH 22.5 (L) 01/25/2020 12:10 PM   VITAMINB12 513 02/27/2022 12:04 PM   BHALPFXT02 409 10/18/2021 04:24 PM    Clinical ASCVD: No  The 10-year ASCVD risk score (Arnett DK, et al., 2019) is: 5.7%   Values used to calculate the score:     Age: 57 years     Sex: Female     Is Non-Hispanic African American: No     Diabetic: Yes     Tobacco smoker: No     Systolic Blood Pressure: 735 mmHg     Is BP treated: Yes     HDL Cholesterol: 51 mg/dL     Total Cholesterol: 179 mg/dL        10/18/2021    2:55 PM 10/05/2020   10:23 AM 08/13/2019    9:24 AM  Depression screen PHQ 2/9  Decreased Interest 0 0 0  Down, Depressed, Hopeless 0 0 0  PHQ - 2 Score 0 0 0  Altered sleeping   0  Tired, decreased energy   0  Change in appetite   0  Feeling  bad or failure about yourself    0  Trouble concentrating   0  Moving slowly or fidgety/restless   0  Suicidal thoughts   0  PHQ-9 Score   0  Difficult doing work/chores   Not difficult at all     Social History   Tobacco Use  Smoking Status Never  Smokeless Tobacco Never   BP Readings from Last 3 Encounters:  04/17/22 (!) 138/90  02/27/22 134/80  10/18/21 (!) 150/90   Pulse Readings from Last 3 Encounters:  04/17/22 86  02/27/22 79  10/18/21 96   Wt Readings from Last 3 Encounters:  04/17/22 (!) 379 lb 3.2 oz (172 kg)  02/27/22 (!) 379 lb 9.6 oz (172.2 kg)  10/18/21 (!) 382 lb (173.3 kg)   BMI Readings from Last 3 Encounters:  04/17/22 61.20 kg/m  02/27/22 61.27 kg/m  10/18/21 61.66 kg/m    Allergies  Allergen Reactions   Doxycycline Anaphylaxis, Diarrhea and Nausea And Vomiting    headache   Oxycodone-Acetaminophen Hives and Nausea And Vomiting   Prochlorperazine Edisylate Other (See Comments)    Hyper and jitteriness   Amoxicillin Rash and Other (See Comments)    Has patient had a PCN reaction causing immediate rash, facial/tongue/throat swelling, SOB or lightheadedness with hypotension: Unknown Has patient had a PCN reaction causing severe rash involving mucus membranes or skin necrosis: Unknown Has patient had a PCN reaction that required hospitalization: No Has patient had a PCN reaction occurring within the last 10 years: Yes If all of the above answers are "NO", then may proceed with Cephalosporin use.    Compazine Other (See Comments)    Hyper/jitters/blotches   Eszopiclone Other (See Comments)    Metallic taste and became un effective    Keppra [Levetiracetam] Other (See Comments)    hyperactive   Latex    Levetiracetam Other (See Comments)    Jitters/hyper   Neurontin [Gabapentin] Nausea And Vomiting   Propoxyphene Hives   Robitussin Dm Max Day-Night Hives   Tape     PAPER TAPE-skin burns/severe irritation  PATIENT DOES NOT  TOLERATE PAPER  TAPE   Trileptal [Oxcarbazepine] Other (See Comments)    REACTION: immune system suppressed   Bactrim [Sulfamethoxazole-Trimethoprim] Rash   Bupropion Rash   Codeine Rash   Hydrocodone-Acetaminophen Rash   Moxifloxacin Swelling and Rash   Propoxyphene N-Acetaminophen Nausea And Vomiting and Rash   Vicodin [Hydrocodone-Acetaminophen] Rash    Medications Reviewed Today     Reviewed by [name], FNP (Family Nurse Practitioner) on 04/17/22 at 1150  Med List Status: <None>   Medication Order Taking? Sig Documenting Provider Last Dose Status Informant  albuterol (PROAIR HFA) 108 (90 Base) MCG/ACT inhaler [id number] Yes Inhale 2 puffs into the lungs every 4 (four) hours as needed for wheezing or shortness of breath. [name], MD Taking Active   azelastine (ASTELIN) 0.1 % nasal spray [id number] Yes Place 2 sprays into both nostrils 2 (two) times daily. Use in each nostril as directed [name], FNP Taking Active   baclofen (LIORESAL) 10 MG tablet [id number] Yes Take by mouth. [provider] Taking Active   benzonatate (TESSALON PERLES) 100 MG capsule [id number]  Take 1 capsule (100 mg total) by mouth 3 (three) times daily as needed. Evelina Dun A, FNP  Active   butalbital-acetaminophen-caffeine (FIORICET) 50-325-40 MG tablet [id number] Yes Take 1 tablet by mouth every 4 (four) hours as needed. [provider] Taking Active   cetirizine (ZYRTEC ALLERGY) 10 MG tablet [id number] Yes Take 1 tablet (10 mg total) by mouth daily. Sharion Balloon, FNP Taking Active   ciprofloxacin-dexamethasone (CIPRODEX) OTIC suspension [id number] Yes Place 4 drops into the right ear 2 (two) times daily. [name], FNP  Active   clotrimazole-betamethasone (LOTRISONE) cream [id number]  Apply 1 Application topically daily. Brunetta Jeans, PA-C  Active   diphenhydrAMINE (BENADRYL) 25 mg capsule [id number] Yes Take 25 mg by mouth every 6 (six) hours as needed for allergies.  [provider] Taking Active Self  empagliflozin (JARDIANCE) 10 MG TABS tablet [id number] Yes Take 1 tablet (10 mg total) by mouth daily before breakfast. [name], MD Taking Active   famotidine (PEPCID) 20 MG tablet [id number]  Take 1 tablet (20 mg total) by mouth 2 (two) times daily. [name], FNP  Expired 02/27/22 2359   fluticasone (FLONASE) 50 MCG/ACT nasal spray [id number] Yes Place 1 spray into both nostrils in the morning. [provider] Taking Active Self  furosemide (LASIX) 80 MG tablet [id number]  TAKE 1 TABLET BY MOUTH ONCE DAILY AS NEEDED FOR FLUID [name], MD  Active   Insulin Pen Needle (PEN NEEDLES) 32G X 6 MM MISC [id number] Yes 1 each by Does not apply route once a week. [name], FNP Taking Active   levofloxacin (LEVAQUIN) 750 MG tablet [id number] Yes Take 1 tablet (750 mg total) by mouth daily for 10 days. [name], FNP  Active   levothyroxine (SYNTHROID) 100 MCG tablet [id number] Yes TAKE 1 TABLET BY MOUTH BEFORE Adella Nissen, MD Taking Active   Magnesium 400 MG TABS [id number] Yes Take 1 tablet by mouth daily. [provider] Taking Active Self  meclizine (ANTIVERT) 25 MG tablet [id number] Yes Take 12.5-25 mg by mouth 2 (two) times daily as needed for dizziness (vertigo). [provider] Taking Active Self  methocarbamol (ROBAXIN) 750 MG tablet [id number] Yes Take 500 mg by mouth every 6 (six) hours as needed for muscle spasms. 1 at bedtime [provider] Taking Active Self  morphine (MSIR) 15 MG tablet [id number] Yes Take 15 mg by mouth 4 (four)  times daily as needed for severe pain.  [provider] Taking Active Self  Multiple Vitamins-Minerals (MAXIMUM DAILY GREEN PO) [id number] Yes Take 1 Package by mouth 3 (three) times a week. It Works! Greens [provider] Taking Active Self  predniSONE (DELTASONE) 20 MG tablet [id number] Yes Take 2 tablets (40 mg total) by mouth daily with breakfast for 5  days. Sharion Balloon, FNP Taking Active   promethazine (PHENERGAN) 25 MG tablet [id number] Yes TAKE 1/2 TO 1 (ONE-HALF TO ONE) TABLET BY MOUTH EVERY 8 HOURS AS NEEDED FOR NAUSEA Esterwood, Amy S, PA-C Taking Active   RESTASIS 0.05 % ophthalmic emulsion [id number] Yes Place 1 drop into both eyes 2 (two) times daily. [provider] Taking Active Self  Semaglutide,0.25 or 0.'5MG'$ /DOS, (OZEMPIC, 0.25 OR 0.5 MG/DOSE,) 2 MG/1.5ML SOPN [id number] Yes Inject 0.5 mg into the skin once a week. [name], MD Taking Active   SUDOGEST MAXIMUM STRENGTH 30 MG tablet [id number] Yes TAKE 1 TABLET BY MOUTH TWICE DAILY AS NEEDED FOR CONGESTION [name], FNP Taking Active   traMADol (ULTRAM) 50 MG tablet [id number]  TAKE 1 TABLET BY MOUTH EVERY 6 HOURS AS NEEDED Esterwood, Amy S, PA-C  Active   Vitamin D, Ergocalciferol, (DRISDOL) 1.25 MG (50000 UNIT) CAPS capsule [id number] Yes Take 1 capsule (50,000 Units total) by mouth every 7 (seven) days. [name], MD Taking Active             SDOH:  (Social Determinants of Health) assessments and interventions performed: No SDOH Interventions    Flowsheet Row Chronic Care Management from 11/15/2021 in [facility] Clinical Support from 08/13/2019 in [facility] Clinical Support from 08/12/2018 in [facility]  SDOH Interventions     Depression Interventions/Treatment  -- PHQ2-9 Score <4 Follow-up Not Indicated PHQ2-9 Score <4 Follow-up Not Indicated  Financial Strain Interventions Other (Comment)  [Begin enrollment for patient assistance for Ozempic due to cost] -- --      SDOH Screenings   Food Insecurity: No Food Insecurity (10/18/2021)  Transportation Needs: No Transportation Needs (10/18/2021)  Depression (PHQ2-9): Low Risk  (10/18/2021)  Financial Resource Strain: High Risk (11/21/2021)  Physical Activity: Inactive (10/18/2021)  Stress: No Stress Concern Present (10/18/2021)  Tobacco  Use: Low Risk  (04/17/2022)    Medication Assistance: Application for Ozempic and Jardiance  medication assistance program. in process.  Anticipated assistance start date 05/10/2022.  See plan of care for additional detail.  Medication Access: Within the past 30 days, how often has patient missed a dose of medication? She has not missed her medication Is a pillbox or other method used to improve adherence? No  Factors that may affect medication adherence? financial need, nonadherence to medications, and lack of understanding of disease management Are meds synced by current pharmacy? No  Are meds delivered by current pharmacy? No  Does patient experience delays in picking up medications due to transportation concerns? No   [facility] Services Reviewed: Is patient disadvantaged to use [facility]?: No  Current Rx insurance plan: [facility] Medicare  Name and location of Current pharmacy:  [place], [place] - [address] [place] [address] Phone: [phone] Fax: [phone]  [facility] services reviewed with patient today?: No  Patient requests to transfer care to [facility] Pharm[facility]?: No  Reason patient declined to change pharmacies: Resistance to change  Compliance/Adherence/Medication fill history: Care Gaps: COVID-19 Vaccine Ophthalmology Exam  Star-Rating Drugs: Vania Rea  10 mg tablet  Ozempic 0.5 mg injection  Assessment/Plan  Diabetes (A1c goal <7%) -Uncontrolled -Current medications: Jardiance 10 mg tablet daily  Appropriate, Effective, Safe, Query accessible Patient received a 14 day supply of Jardiance at the office  Ozempic 0.5 mg once per week Appropriate, Effective, Safe, Accessible  -Current home glucose readings fasting glucose: she is not currently checking her BS  -Denies hypoglycemic/hyperglycemic symptoms -Current meal patterns: she has been eating out more often  breakfast: banana, BLT, Lynnae Sandhoff -  45 calories   lunch: 2 pieces of pizza   dinner: [employer] for take out  snacks: orange, 1/2 pack of Nabbs  drinks: coffee, she is drinking plenty of water, at least 6- 8 bottles of water per day  -Current exercise: she is active through exercising and diet changes  -Educated on Complications of diabetes including kidney damage, retinal damage, and cardiovascular disease; Exercise goal of 150 minutes per week; -She reports that this was the first time that she did not gain weight  -Counseled to check feet daily and get yearly eye exams -She is realizing that it is okay to not eat, or have things when it is offered to he -Recommended to continue current medication Assessed patient finances. Recommended patient complete patient assistance application   Orlando Penner, CPP, PharmD Clinical Pharmacist Practitioner [facility] [phone]

## 2022-04-24 ENCOUNTER — Encounter: Payer: Self-pay | Admitting: Nurse Practitioner

## 2022-04-24 ENCOUNTER — Other Ambulatory Visit: Payer: Self-pay | Admitting: Nurse Practitioner

## 2022-04-24 MED ORDER — FLUCONAZOLE 100 MG PO TABS: 100.0000 mg | ORAL_TABLET | Freq: Every day | ORAL | 1 refills | Status: DC

## 2022-05-01 ENCOUNTER — Telehealth: Payer: Self-pay

## 2022-05-09 ENCOUNTER — Encounter: Payer: Self-pay | Admitting: Internal Medicine

## 2022-05-12 ENCOUNTER — Encounter: Payer: Self-pay | Admitting: Nurse Practitioner

## 2022-05-14 DIAGNOSIS — M940 Chondrocostal junction syndrome [Tietze]: Secondary | ICD-10-CM | POA: Insufficient documentation

## 2022-05-15 ENCOUNTER — Encounter: Payer: Self-pay | Admitting: Nurse Practitioner

## 2022-05-15 ENCOUNTER — Ambulatory Visit (INDEPENDENT_AMBULATORY_CARE_PROVIDER_SITE_OTHER): Payer: Medicare Other | Admitting: Nurse Practitioner

## 2022-05-15 VITALS — BP 142/98 | HR 97 | Ht 66.0 in

## 2022-05-15 DIAGNOSIS — R350 Frequency of micturition: Secondary | ICD-10-CM

## 2022-05-15 DIAGNOSIS — N3000 Acute cystitis without hematuria: Secondary | ICD-10-CM | POA: Diagnosis not present

## 2022-05-15 DIAGNOSIS — I10 Essential (primary) hypertension: Secondary | ICD-10-CM

## 2022-05-15 DIAGNOSIS — H9201 Otalgia, right ear: Secondary | ICD-10-CM | POA: Diagnosis not present

## 2022-05-15 DIAGNOSIS — R82998 Other abnormal findings in urine: Secondary | ICD-10-CM | POA: Diagnosis not present

## 2022-05-15 LAB — POCT URINALYSIS DIPSTICK
Glucose, UA: POSITIVE — AB
Nitrite, UA: POSITIVE
Protein, UA: POSITIVE — AB
Spec Grav, UA: 1.01 (ref 1.010–1.025)
Urobilinogen, UA: 4 E.U./dL — AB
pH, UA: 5 (ref 5.0–8.0)

## 2022-05-15 MED ORDER — SULFAMETHOXAZOLE-TRIMETHOPRIM 800-160 MG PO TABS: 1.0000 | ORAL_TABLET | Freq: Two times a day (BID) | ORAL | 0 refills | Status: DC

## 2022-05-15 NOTE — Progress Notes (Signed)
I,Marsean Elkhatib,acting as a Education administrator for [name], FNP.,have documented all relevant documentation on the behalf of [name], FNP,as directed by  [name], FNP while in the presence of [name], [place].    Subjective:     Patient ID: [name] Wright , female    DOB: [birth year] , 57 y.o.   MRN: [id number]   Chief Complaint  Patient presents with   Urinary Frequency   Dysuria   Otalgia    right    HPI  Patient presents today for urinary frequency and burning with some abdominal/flank pain and nausea. She denies emesis, hematuria, fever (elevated to 99?). She has been using Azo with some relief.  She was also having back pain.  She has been able to take bactrim but will  Patient also reports continued right ear pain/pressure.      Past Medical History:  Diagnosis Date   Acute cystitis    Allergic rhinitis    Childhood asthma    no problems as adult - no inhaler   Chronic insomnia    Chronic pain due to trauma 02/2001   closed head trauma - Followed by Dr [facility]   Complication of anesthesia    Dyspepsia    Family history of ovarian cancer    Head trauma 02/24/2001   closed   History of kidney stones    passed stone - no surgery required   Hypothyroidism    PONV (postoperative nausea and vomiting)    Pre-diabetes    SVD (spontaneous vaginal delivery)    fetal demise at 36 months     Family History  Problem Relation Age of Onset   Multiple sclerosis Mother 75   Bladder Cancer Mother 66   Heart Problems Father    Dementia Paternal Aunt    Ovarian cancer Maternal Grandmother    Lung cancer Maternal Grandfather    Tuberculosis Paternal Grandmother      Current Outpatient Medications:    albuterol (PROAIR HFA) 108 (90 Base) MCG/ACT inhaler, Inhale 2 puffs into the lungs every 4 (four) hours as needed for wheezing or shortness of breath., Disp: 1 each, Rfl: 1   azelastine (ASTELIN) 0.1 % nasal spray, Place 2 sprays into  both nostrils 2 (two) times daily. Use in each nostril as directed, Disp: 30 mL, Rfl: 2   baclofen (LIORESAL) 10 MG tablet, Take by mouth., Disp: , Rfl:    butalbital-acetaminophen-caffeine (FIORICET) 50-325-40 MG tablet, Take 1 tablet by mouth every 4 (four) hours as needed., Disp: , Rfl:    cetirizine (ZYRTEC ALLERGY) 10 MG tablet, Take 1 tablet (10 mg total) by mouth daily., Disp: 90 tablet, Rfl: 0   diphenhydrAMINE (BENADRYL) 25 mg capsule, Take 25 mg by mouth every 6 (six) hours as needed for allergies. , Disp: , Rfl:    EPINEPHrine 0.3 mg/0.3 mL IJ SOAJ injection, Inject 0.3 mg into the muscle See admin instructions., Disp: , Rfl:    fluticasone (FLONASE) 50 MCG/ACT nasal spray, Place 1 spray into both nostrils in the morning., Disp: , Rfl:    furosemide (LASIX) 80 MG tablet, TAKE 1 TABLET BY MOUTH ONCE DAILY AS NEEDED FOR FLUID, Disp: 90 tablet, Rfl: 0   Insulin Pen Needle (PEN NEEDLES) 32G X 6 MM MISC, 1 each by Does not apply route once a week., Disp: 30 each, Rfl: 1   levothyroxine (SYNTHROID) 100 MCG tablet, TAKE 1 TABLET BY MOUTH BEFORE BREAKFAST, Disp: 90 tablet, Rfl: 0  Magnesium 400 MG TABS, Take 1 tablet by mouth daily., Disp: , Rfl:    meclizine (ANTIVERT) 25 MG tablet, Take 12.5-25 mg by mouth 2 (two) times daily as needed for dizziness (vertigo)., Disp: , Rfl:    methocarbamol (ROBAXIN) 750 MG tablet, Take 500 mg by mouth every 6 (six) hours as needed for muscle spasms. 1 at bedtime, Disp: , Rfl:    morphine (MSIR) 15 MG tablet, Take 15 mg by mouth 4 (four) times daily as needed for severe pain. , Disp: , Rfl:    Multiple Vitamins-Minerals (MAXIMUM DAILY GREEN PO), Take 1 Package by mouth 3 (three) times a week. It Works! Greens, Disp: , Rfl:    OZEMPIC, 0.25 OR 0.5 MG/DOSE, 2 MG/3ML SOPN, Inject 0.5 mg into the skin once a week., Disp: , Rfl:    promethazine (PHENERGAN) 25 MG tablet, TAKE 1/2 TO 1 (ONE-HALF TO ONE) TABLET BY MOUTH EVERY 8 HOURS AS NEEDED FOR NAUSEA, Disp: 40  tablet, Rfl: 0   RESTASIS 0.05 % ophthalmic emulsion, Place 1 drop into both eyes 2 (two) times daily., Disp: , Rfl:    SUDOGEST MAXIMUM STRENGTH 30 MG tablet, TAKE 1 TABLET BY MOUTH TWICE DAILY AS NEEDED FOR CONGESTION, Disp: 90 tablet, Rfl: 0   sulfamethoxazole-trimethoprim (BACTRIM DS) 800-160 MG tablet, Take 1 tablet by mouth 2 (two) times daily., Disp: 10 tablet, Rfl: 0   traMADol (ULTRAM) 50 MG tablet, TAKE 1 TABLET BY MOUTH EVERY 6 HOURS AS NEEDED, Disp: 30 tablet, Rfl: 0   Vitamin D, Ergocalciferol, (DRISDOL) 1.25 MG (50000 UNIT) CAPS capsule, Take 1 capsule (50,000 Units total) by mouth every 7 (seven) days., Disp: 12 capsule, Rfl: 2   empagliflozin (JARDIANCE) 10 MG TABS tablet, Take 1 tablet (10 mg total) by mouth daily before breakfast. (Patient not taking: Reported on 05/15/2022), Disp: 90 tablet, Rfl: 1   famotidine (PEPCID) 20 MG tablet, Take 1 tablet (20 mg total) by mouth 2 (two) times daily., Disp: 60 tablet, Rfl: 1   Allergies  Allergen Reactions   Doxycycline Anaphylaxis, Diarrhea and Nausea And Vomiting    headache   Oxycodone-Acetaminophen Hives and Nausea And Vomiting   Prochlorperazine Edisylate Other (See Comments)    Hyper and jitteriness   Amoxicillin Rash and Other (See Comments)    Has patient had a PCN reaction causing immediate rash, facial/tongue/throat swelling, SOB or lightheadedness with hypotension: Unknown Has patient had a PCN reaction causing severe rash involving mucus membranes or skin necrosis: Unknown Has patient had a PCN reaction that required hospitalization: No Has patient had a PCN reaction occurring within the last 10 years: Yes If all of the above answers are "NO", then may proceed with Cephalosporin use.    Compazine Other (See Comments)    Hyper/jitters/blotches   Eszopiclone Other (See Comments)    Metallic taste and became un effective    Keppra [Levetiracetam] Other (See Comments)    hyperactive   Latex    Levetiracetam Other (See  Comments)    Jitters/hyper   Neurontin [Gabapentin] Nausea And Vomiting   Propoxyphene Hives   Robitussin Dm Max Day-Night Hives   Tape     PAPER TAPE-skin burns/severe irritation  PATIENT DOES NOT TOLERATE PAPER TAPE   Trileptal [Oxcarbazepine] Other (See Comments)    REACTION: immune system suppressed   Bactrim [Sulfamethoxazole-Trimethoprim] Rash   Bupropion Rash   Codeine Rash   Hydrocodone-Acetaminophen Rash   Moxifloxacin Swelling and Rash   Propoxyphene N-Acetaminophen Nausea And Vomiting and Rash  Vicodin [Hydrocodone-Acetaminophen] Rash     Review of Systems  HENT:  Positive for ear pain.   Gastrointestinal:  Positive for abdominal pain (she has been using a pain patch to the front of her abdomen) and nausea.  Genitourinary:  Positive for dysuria, flank pain and frequency.     Today's Vitals   05/15/22 1514  BP: (!) 142/98  Pulse: 97  SpO2: 99%  Height: 5' 6"$  (1.676 m)   Body mass index is 61.2 kg/m.   Objective:  Physical Exam Vitals reviewed.  Constitutional:      General: She is not in acute distress.    Appearance: Normal appearance. She is obese.  Cardiovascular:     Rate and Rhythm: Normal rate and regular rhythm.     Pulses: Normal pulses.     Heart sounds: Normal heart sounds. No murmur heard. Pulmonary:     Effort: Pulmonary effort is normal. No respiratory distress.     Breath sounds: Normal breath sounds. No wheezing.  Abdominal:     General: Abdomen is flat. Bowel sounds are normal. There is no distension.     Palpations: Abdomen is soft.     Tenderness: There is no abdominal tenderness (lower abdomen and left upper abdomen). There is no right CVA tenderness or left CVA tenderness.  Neurological:     General: No focal deficit present.     Mental Status: She is alert and oriented to person, place, and time.     Cranial Nerves: No cranial nerve deficit.     Motor: No weakness.  Psychiatric:        Mood and Affect: Mood normal.         Behavior: Behavior normal.        Thought Content: Thought content normal.        Judgment: Judgment normal.         Assessment And Plan:     1. Acute cystitis without hematuria Comments: She is positive for nitrates will treat with an antibiotic.  Continue to stay well-hydrated with water.  Send urine culture. - sulfamethoxazole-trimethoprim (BACTRIM DS) 800-160 MG tablet; Take 1 tablet by mouth 2 (two) times daily.  Dispense: 10 tablet; Refill: 0  2. Leukocytes in urine - Culture, Urine - sulfamethoxazole-trimethoprim (BACTRIM DS) 800-160 MG tablet; Take 1 tablet by mouth 2 (two) times daily.  Dispense: 10 tablet; Refill: 0  3. Urinary frequency - POCT Urinalysis Dipstick (81002) - sulfamethoxazole-trimethoprim (BACTRIM DS) 800-160 MG tablet; Take 1 tablet by mouth 2 (two) times daily.  Dispense: 10 tablet; Refill: 0  4. Essential hypertension, benign Comments: blood pressure is elevated she is advised to avoid Ibuprofen.  5. Otalgia, right ear Comments: No abnormal findings with her right ear.  Continue with antihistamines as tolerated.     Patient was given opportunity to ask questions. Patient verbalized understanding of the plan and was able to repeat key elements of the plan. All questions were answered to their satisfaction.  [name], FNP   I, [name], FNP, have reviewed all documentation for this visit. The documentation on 05/15/22 for the exam, diagnosis, procedures, and orders are all accurate and complete.   IF YOU HAVE BEEN REFERRED TO A SPECIALIST, IT MAY TAKE 1-2 WEEKS TO SCHEDULE/PROCESS THE REFERRAL. IF YOU HAVE NOT HEARD FROM US/SPECIALIST IN TWO WEEKS, PLEASE GIVE [place] A CALL AT [phone] X [phone].   THE PATIENT IS ENCOURAGED TO PRACTICE SOCIAL DISTANCING DUE TO THE COVID-19 PANDEMIC.

## 2022-05-15 NOTE — Patient Instructions (Signed)

## 2022-05-16 ENCOUNTER — Encounter: Payer: Self-pay | Admitting: Nurse Practitioner

## 2022-05-16 ENCOUNTER — Ambulatory Visit: Payer: Medicare Other | Admitting: Nurse Practitioner

## 2022-05-17 LAB — URINE CULTURE

## 2022-05-27 ENCOUNTER — Encounter: Payer: Self-pay | Admitting: Nurse Practitioner

## 2022-05-27 DIAGNOSIS — R350 Frequency of micturition: Secondary | ICD-10-CM | POA: Insufficient documentation

## 2022-05-27 DIAGNOSIS — R82998 Other abnormal findings in urine: Secondary | ICD-10-CM | POA: Insufficient documentation

## 2022-06-01 DIAGNOSIS — G894 Chronic pain syndrome: Secondary | ICD-10-CM | POA: Diagnosis not present

## 2022-06-01 DIAGNOSIS — R519 Headache, unspecified: Secondary | ICD-10-CM | POA: Diagnosis not present

## 2022-06-01 DIAGNOSIS — Z79891 Long term (current) use of opiate analgesic: Secondary | ICD-10-CM | POA: Diagnosis not present

## 2022-06-01 DIAGNOSIS — M7918 Myalgia, other site: Secondary | ICD-10-CM | POA: Diagnosis not present

## 2022-06-11 ENCOUNTER — Telehealth (INDEPENDENT_AMBULATORY_CARE_PROVIDER_SITE_OTHER): Payer: Medicare Other | Admitting: Internal Medicine

## 2022-06-11 ENCOUNTER — Other Ambulatory Visit: Payer: Self-pay

## 2022-06-11 ENCOUNTER — Encounter: Payer: Self-pay | Admitting: Internal Medicine

## 2022-06-11 DIAGNOSIS — N926 Irregular menstruation, unspecified: Secondary | ICD-10-CM | POA: Diagnosis not present

## 2022-06-11 DIAGNOSIS — I10 Essential (primary) hypertension: Secondary | ICD-10-CM | POA: Diagnosis not present

## 2022-06-11 DIAGNOSIS — B3731 Acute candidiasis of vulva and vagina: Secondary | ICD-10-CM | POA: Diagnosis not present

## 2022-06-11 DIAGNOSIS — E1165 Type 2 diabetes mellitus with hyperglycemia: Secondary | ICD-10-CM

## 2022-06-11 MED ORDER — LEVOTHYROXINE SODIUM 100 MCG PO TABS: ORAL_TABLET | ORAL | 0 refills | Status: DC

## 2022-06-11 MED ORDER — SEMAGLUTIDE (1 MG/DOSE) 4 MG/3ML ~~LOC~~ SOPN: 1.0000 mg | PEN_INJECTOR | SUBCUTANEOUS | 2 refills | Status: DC

## 2022-06-11 MED ORDER — FLUCONAZOLE 150 MG PO TABS: ORAL_TABLET | ORAL | 0 refills | Status: DC

## 2022-06-11 NOTE — Progress Notes (Signed)
Virtual Visit via Video   This visit type was conducted due to national recommendations for restrictions regarding the COVID-19 Pandemic (e.g. social distancing) in an effort to limit this patient's exposure and mitigate transmission in our community.  Due to her co-morbid illnesses, this patient is at least at moderate risk for complications without adequate follow up.  This format is felt to be most appropriate for this patient at this time.  All issues noted in this document were discussed and addressed.  A limited physical exam was performed with this format.    This visit type was conducted due to national recommendations for restrictions regarding the COVID-19 Pandemic (e.g. social distancing) in an effort to limit this patient's exposure and mitigate transmission in our community.  Patients identity confirmed using two different identifiers.  This format is felt to be most appropriate for this patient at this time.  All issues noted in this document were discussed and addressed.  No physical exam was performed (except for noted visual exam findings with Video Visits).    Date:  06/11/2022   ID:  [name] Wright, DOB Dec 23, 1965, MRN [id number]  Patient Location:  Home  Provider location:   Office    Chief Complaint:  "I am having issues with Jardiance"  History of Present Illness:    [name] Wright is a 57 y.o. female who presents via video conferencing for a telehealth visit today.    The patient does not have symptoms concerning for COVID-19 infection (fever, chills, cough, or new shortness of breath).   She presents today for virtual visit. She prefers this method of contact due to COVID-19 pandemic.  She presents today wanting to inquire about Jardiance. She states she is having frequent yeast infections. She thought her sx were initially due to frequent abx use; however, her sx have persisted. She no longer wants to take this medication.   Diabetes She presents for  her follow-up diabetic visit. She has type 2 diabetes mellitus. There are no hypoglycemic associated symptoms. Pertinent negatives for diabetes include no blurred vision, no polydipsia, no polyphagia and no polyuria. There are no hypoglycemic complications. Risk factors for coronary artery disease include diabetes mellitus, dyslipidemia, hypertension, sedentary lifestyle and obesity. An ACE inhibitor/angiotensin II receptor blocker is being taken. Eye exam is current.  Hypertension This is a chronic problem. The current episode started more than 1 month ago. The problem is unchanged. Pertinent negatives include no blurred vision, neck pain or orthopnea. Compliance problems include exercise.      Past Medical History:  Diagnosis Date   Acute cystitis    Allergic rhinitis    Childhood asthma    no problems as adult - no inhaler   Chronic insomnia    Chronic pain due to trauma 02/2001   closed head trauma - Followed by Dr We[facility]   Complication of anesthesia    Dyspepsia    Family history of ovarian cancer    Head trauma 02/24/2001   closed   History of kidney stones    passed stone - no surgery required   Hypothyroidism    PONV (postoperative nausea and vomiting)    Pre-diabetes    SVD (spontaneous vaginal delivery)    fetal demise at 5 months   Past Surgical History:  Procedure Laterality Date   APPENDECTOMY     BIOPSY  08/25/2020   Procedure: BIOPSY;  Surgeon: Lavena Bullion, DO;  Location: [facility];  Service: Gastroenterology;;  COLONOSCOPY WITH PROPOFOL N/A 08/25/2020   Procedure: COLONOSCOPY WITH PROPOFOL;  Surgeon: Lavena Bullion, DO;  Location: [facility];  Service: Gastroenterology;  Laterality: N/A;   DILATATION & CURETTAGE/HYSTEROSCOPY WITH MYOSURE N/A 04/15/2017   Procedure: DILATATION & CURETTAGE/HYSTEROSCOPY WITH MYOSURE POLYPECTOMY;  Surgeon: Everlene Farrier, MD;  Location: [facility];  Service: Gynecology;  Laterality: N/A;    DILATATION & CURETTAGE/HYSTEROSCOPY WITH MYOSURE N/A 06/18/2019   Procedure: DILATATION & CURETTAGE/HYSTEROSCOPY WITH  MYOSURE;  Surgeon: Everlene Farrier, MD;  Location: [place];  Service: Gynecology;  Laterality: N/A;   ESOPHAGOGASTRODUODENOSCOPY (EGD) WITH PROPOFOL N/A 08/25/2020   Procedure: ESOPHAGOGASTRODUODENOSCOPY (EGD) WITH PROPOFOL;  Surgeon: Lavena Bullion, DO;  Location: [facility];  Service: Gastroenterology;  Laterality: N/A;   EYE SURGERY     cataracts removed   INCONTINENCE SURGERY     PILONIDAL CYST EXCISION     x2   POLYPECTOMY  08/25/2020   Procedure: POLYPECTOMY;  Surgeon: Lavena Bullion, DO;  Location: [facility];  Service: Gastroenterology;;   repair of toe laceration     dermabond to right big toe right foot   repair of torn ligament right leg     patient denies this surgery   right foot fracture     cast only   TONSILLECTOMY     WISDOM TOOTH EXTRACTION       Current Meds  Medication Sig   albuterol (PROAIR HFA) 108 (90 Base) MCG/ACT inhaler Inhale 2 puffs into the lungs every 4 (four) hours as needed for wheezing or shortness of breath.   azelastine (ASTELIN) 0.1 % nasal spray Place 2 sprays into both nostrils 2 (two) times daily. Use in each nostril as directed   baclofen (LIORESAL) 10 MG tablet Take by mouth.   butalbital-acetaminophen-caffeine (FIORICET) 50-325-40 MG tablet Take 1 tablet by mouth every 4 (four) hours as needed.   cetirizine (ZYRTEC ALLERGY) 10 MG tablet Take 1 tablet (10 mg total) by mouth daily.   diphenhydrAMINE (BENADRYL) 25 mg capsule Take 25 mg by mouth every 6 (six) hours as needed for allergies.    EPINEPHrine 0.3 mg/0.3 mL IJ SOAJ injection Inject 0.3 mg into the muscle See admin instructions.   fluconazole (DIFLUCAN) 150 MG tablet One tab po qd, repeat in 48 hours   fluticasone (FLONASE) 50 MCG/ACT nasal spray Place 1 spray into both nostrils in the morning.   furosemide (LASIX) 80 MG tablet TAKE 1 TABLET BY MOUTH ONCE DAILY AS  NEEDED FOR FLUID   Insulin Pen Needle (PEN NEEDLES) 32G X 6 MM MISC 1 each by Does not apply route once a week.   Magnesium 400 MG TABS Take 1 tablet by mouth daily.   meclizine (ANTIVERT) 25 MG tablet Take 12.5-25 mg by mouth 2 (two) times daily as needed for dizziness (vertigo).   methocarbamol (ROBAXIN) 750 MG tablet Take 500 mg by mouth every 6 (six) hours as needed for muscle spasms. 1 at bedtime   morphine (MSIR) 15 MG tablet Take 15 mg by mouth 4 (four) times daily as needed for severe pain.    Multiple Vitamins-Minerals (MAXIMUM DAILY GREEN PO) Take 1 Package by mouth 3 (three) times a week. It Works! Greens   promethazine (PHENERGAN) 25 MG tablet TAKE 1/2 TO 1 (ONE-HALF TO ONE) TABLET BY MOUTH EVERY 8 HOURS AS NEEDED FOR NAUSEA   RESTASIS 0.05 % ophthalmic emulsion Place 1 drop into both eyes 2 (two) times daily.   Semaglutide, 1 MG/DOSE, 4 MG/3ML SOPN Inject 1 mg into  the skin once a week.   SUDOGEST MAXIMUM STRENGTH 30 MG tablet TAKE 1 TABLET BY MOUTH TWICE DAILY AS NEEDED FOR CONGESTION   traMADol (ULTRAM) 50 MG tablet TAKE 1 TABLET BY MOUTH EVERY 6 HOURS AS NEEDED   Vitamin D, Ergocalciferol, (DRISDOL) 1.25 MG (50000 UNIT) CAPS capsule Take 1 capsule (50,000 Units total) by mouth every 7 (seven) days.   [DISCONTINUED] empagliflozin (JARDIANCE) 10 MG TABS tablet Take 1 tablet (10 mg total) by mouth daily before breakfast.   [DISCONTINUED] levothyroxine (SYNTHROID) 100 MCG tablet TAKE 1 TABLET BY MOUTH BEFORE BREAKFAST   [DISCONTINUED] OZEMPIC, 0.25 OR 0.5 MG/DOSE, 2 MG/3ML SOPN Inject 0.5 mg into the skin once a week.     Allergies:   Doxycycline, Oxycodone-acetaminophen, Prochlorperazine edisylate, Amoxicillin, Compazine, Eszopiclone, Keppra [levetiracetam], Latex, Levetiracetam, Neurontin [gabapentin], Propoxyphene, Robitussin dm max day-night, Tape, Trileptal [oxcarbazepine], Bactrim [sulfamethoxazole-trimethoprim], Bupropion, Codeine, Hydrocodone-acetaminophen, Moxifloxacin,  Propoxyphene n-acetaminophen, and Vicodin [hydrocodone-acetaminophen]   Social History   Tobacco Use   Smoking status: Never   Smokeless tobacco: Never  Vaping Use   Vaping Use: Never used  Substance Use Topics   Alcohol use: No    Comment: "1 drink/year"   Drug use: Yes    Types: Morphine    Comment: Morphine 15 mg tab 4xdaily prn     Family Hx: The patient's family history includes Bladder Cancer (age of onset: 59) in her mother; Dementia in her paternal aunt; Heart Problems in her father; Lung cancer in her maternal grandfather; Multiple sclerosis (age of onset: 5) in her mother; Ovarian cancer in her maternal grandmother; Tuberculosis in her paternal grandmother.  ROS:   Please see the history of present illness.    Review of Systems  Constitutional: Negative.   Eyes:  Negative for blurred vision.  Respiratory: Negative.    Cardiovascular: Negative.  Negative for orthopnea.  Gastrointestinal: Negative.   Genitourinary:        Frequent vaginal yeast infections  Musculoskeletal:  Negative for neck pain.  Neurological: Negative.   Endo/Heme/Allergies:  Negative for polydipsia and polyphagia.  Psychiatric/Behavioral: Negative.      All other systems reviewed and are negative.   Labs/Other Tests and Data Reviewed:    Recent Labs: 10/18/2021: BNP 43.7 02/27/2022: ALT 31; BUN 10; Creatinine, Ser 0.71; Hemoglobin 14.4; Platelets 327; Potassium 4.4; Sodium 142; TSH 1.400   Recent Lipid Panel Lab Results  Component Value Date/Time   CHOL 179 10/18/2021 04:24 PM   TRIG 121 10/18/2021 04:24 PM   HDL 51 10/18/2021 04:24 PM   CHOLHDL 3.5 10/18/2021 04:24 PM   CHOLHDL 4 09/01/2015 10:47 AM   LDLCALC 106 (H) 10/18/2021 04:24 PM    Wt Readings from Last 3 Encounters:  04/17/22 (!) 379 lb 3.2 oz (172 kg)  02/27/22 (!) 379 lb 9.6 oz (172.2 kg)  10/18/21 (!) 382 lb (173.3 kg)     Exam:    Vital Signs:  There were no vitals taken for this visit.    Physical  Exam Vitals and nursing note reviewed.  Constitutional:      Appearance: She is obese.  HENT:     Head: Normocephalic and atraumatic.  Eyes:     Extraocular Movements: Extraocular movements intact.  Pulmonary:     Effort: Pulmonary effort is normal.  Musculoskeletal:     Cervical back: Normal range of motion.  Neurological:     Mental Status: She is alert and oriented to person, place, and time.  Psychiatric:  Mood and Affect: Affect normal.    ASSESSMENT & PLAN:    1. Uncontrolled type 2 diabetes mellitus with hyperglycemia ([place]) Comments: We agreed to increase Ozempic,she will first start w/ 0.'75mg'$  weekly x 4 weeks, then '1mg'$  weekly. D/c Jardiance due to yeast infections. Plan to try Farxiga. - Hemoglobin A1c; Future - CMP14+EGFR; Future  2. Essential hypertension, benign Comments: Chronic, encouraged to follow low sodium diet.  3. Vaginal candidiasis Comments: Recurrent. Likely exacerbated by abx use and Jardiance. I will send rx fluconazole.  4. Irregular menses Comments: She is still having menses, unsure if GYN has performed EMBx. I will request records. I plan to check [facility] at her next visit. [facility]; Future    COVID-19 Education: The signs and symptoms of COVID-19 were discussed with the patient and how to seek care for testing (follow up with PCP or arrange E-visit).  The importance of social distancing was discussed today.  Patient Risk:   After full review of this patients clinical status, I feel that they are at least moderate risk at this time.  Time:   Today, I have spent 17 minutes/4 seconds with the patient with telehealth technology discussing above diagnoses.     Medication Adjustments/Labs and Tests Ordered: Current medicines are reviewed at length with the patient today.  Concerns regarding medicines are outlined above.   Tests Ordered: Orders Placed This Encounter  Procedures   Hemoglobin A1c   CMP14+EGFR   FSH    Medication  Changes: Meds ordered this encounter  Medications   fluconazole (DIFLUCAN) 150 MG tablet    Sig: One tab po qd, repeat in 48 hours    Dispense:  2 tablet    Refill:  0   levothyroxine (SYNTHROID) 100 MCG tablet    Sig: TAKE 1 TABLET BY MOUTH BEFORE BREAKFAST    Dispense:  90 tablet    Refill:  0   Semaglutide, 1 MG/DOSE, 4 MG/3ML SOPN    Sig: Inject 1 mg into the skin once a week.    Dispense:  3 mL    Refill:  2    Disposition:  Follow up prn  Signed, Maximino Greenland, MD

## 2022-06-11 NOTE — Patient Instructions (Signed)

## 2022-06-17 ENCOUNTER — Encounter: Payer: Self-pay | Admitting: Internal Medicine

## 2022-06-18 ENCOUNTER — Other Ambulatory Visit: Payer: Medicare Other

## 2022-06-18 DIAGNOSIS — N926 Irregular menstruation, unspecified: Secondary | ICD-10-CM

## 2022-06-18 DIAGNOSIS — E1165 Type 2 diabetes mellitus with hyperglycemia: Secondary | ICD-10-CM

## 2022-06-19 ENCOUNTER — Other Ambulatory Visit: Payer: Medicare Other

## 2022-06-19 LAB — CMP14+EGFR
ALT: 23 IU/L (ref 0–32)
AST: 26 IU/L (ref 0–40)
Albumin/Globulin Ratio: 1.2 (ref 1.2–2.2)
Albumin: 3.8 g/dL (ref 3.8–4.9)
Alkaline Phosphatase: 118 IU/L (ref 44–121)
BUN/Creatinine Ratio: 15 (ref 9–23)
BUN: 11 mg/dL (ref 6–24)
Bilirubin Total: 0.3 mg/dL (ref 0.0–1.2)
CO2: 22 mmol/L (ref 20–29)
Calcium: 9.1 mg/dL (ref 8.7–10.2)
Chloride: 103 mmol/L (ref 96–106)
Creatinine, Ser: 0.72 mg/dL (ref 0.57–1.00)
Globulin, Total: 3.1 g/dL (ref 1.5–4.5)
Glucose: 182 mg/dL — ABNORMAL HIGH (ref 70–99)
Potassium: 4.4 mmol/L (ref 3.5–5.2)
Sodium: 137 mmol/L (ref 134–144)
Total Protein: 6.9 g/dL (ref 6.0–8.5)
eGFR: 99 mL/min/{1.73_m2} (ref >59)

## 2022-06-19 LAB — HEMOGLOBIN A1C
Est. average glucose Bld gHb Est-mCnc: 157 mg/dL
Hgb A1c MFr Bld: 7.1 % — ABNORMAL HIGH (ref 4.8–5.6)

## 2022-06-19 LAB — FOLLICLE STIMULATING HORMONE: FSH: 18.5 m[IU]/mL

## 2022-06-20 DIAGNOSIS — J45998 Other asthma: Secondary | ICD-10-CM | POA: Diagnosis not present

## 2022-06-20 DIAGNOSIS — J3081 Allergic rhinitis due to animal (cat) (dog) hair and dander: Secondary | ICD-10-CM | POA: Diagnosis not present

## 2022-06-20 DIAGNOSIS — J3089 Other allergic rhinitis: Secondary | ICD-10-CM | POA: Diagnosis not present

## 2022-06-20 DIAGNOSIS — J301 Allergic rhinitis due to pollen: Secondary | ICD-10-CM | POA: Diagnosis not present

## 2022-06-20 DIAGNOSIS — H1045 Other chronic allergic conjunctivitis: Secondary | ICD-10-CM | POA: Diagnosis not present

## 2022-06-20 DIAGNOSIS — J4530 Mild persistent asthma, uncomplicated: Secondary | ICD-10-CM | POA: Diagnosis not present

## 2022-06-28 DIAGNOSIS — J301 Allergic rhinitis due to pollen: Secondary | ICD-10-CM | POA: Diagnosis not present

## 2022-06-28 DIAGNOSIS — J3081 Allergic rhinitis due to animal (cat) (dog) hair and dander: Secondary | ICD-10-CM | POA: Diagnosis not present

## 2022-06-28 DIAGNOSIS — J3089 Other allergic rhinitis: Secondary | ICD-10-CM | POA: Diagnosis not present

## 2022-06-30 ENCOUNTER — Encounter: Payer: Self-pay | Admitting: Internal Medicine

## 2022-07-03 ENCOUNTER — Ambulatory Visit: Payer: Medicare Other | Admitting: Internal Medicine

## 2022-07-06 ENCOUNTER — Telehealth: Payer: Self-pay

## 2022-07-06 NOTE — Progress Notes (Unsigned)
07-06-2022: Contacted [employer] to follow up on Ozempic application. Application was not faxed. Left patient a VM to confirm if application was completed.  07-11-2022: Patient stated husband misplaced ozempic for the both of them. Patient requested applications to be left at office and she will come by next week to fill them out. Sent message to [employer].  [place] Pharmacist Assistant [phone]

## 2022-07-13 ENCOUNTER — Telehealth: Payer: Self-pay

## 2022-07-13 DIAGNOSIS — M170 Bilateral primary osteoarthritis of knee: Secondary | ICD-10-CM | POA: Diagnosis not present

## 2022-07-13 DIAGNOSIS — M1711 Unilateral primary osteoarthritis, right knee: Secondary | ICD-10-CM | POA: Diagnosis not present

## 2022-07-13 DIAGNOSIS — M1712 Unilateral primary osteoarthritis, left knee: Secondary | ICD-10-CM | POA: Diagnosis not present

## 2022-07-13 NOTE — Progress Notes (Cosign Needed)
Filled out 2024 re-enrollment form for [employer] patient assistance for Ozempic, patient will stop by next week to sign, placing at the front desk for patient to sign all highlighted areas and to bring income documentation.    Billee Cashing, CMA Clinical Pharmacist Assistant [phone]

## 2022-07-16 ENCOUNTER — Telehealth: Payer: Self-pay

## 2022-07-16 NOTE — Progress Notes (Signed)
Care Management & Coordination Services Pharmacy Team  Reason for Encounter: Appointment Reminder  Contacted patient to confirm telephone appointment with Cherylin Mylar, PharmD on 07-18-2022 at 11:00. Spoke with patient on 07/16/2022   Do you have any problems getting your medications? No  What is your top health concern you would like to discuss at your upcoming visit? Patient stated none  Have you seen any other providers since your last visit with PCP? No   Chart review: Recent office visits:  06-11-2022 Dorothyann Peng, MD. Glucose= 182. A1C= 7.1. Increase ozempic 0.5 mg weekly to 1 mg weekly. Start Diflucan 150 mg One tab po qd, repeat in 48 hours. Stop jardiance, famotidine. Completed bactrim.   05-15-2022 Arnette Felts, FNP. Abnormal UA. Start bactrim 800-160 mg twice daily. Completed Tessalon, ciprodex, lotrisone and diflucan.  Recent consult visits:  06-28-2022 Vergia Alberts (allergist). Visit for vaccine.  06-01-2022 Charlesetta Shanks (Anesthesiology). Unable to view encounter.  Hospital visits:  None in previous 6 months  Star Rating Drugs:  Ozempic 1 mg- Last filled 07-06-2022 28 [facility]. Previous 06-06-2022 28 DS.  Care Gaps: Annual wellness visit in last year? Yes  If Diabetic: Last eye exam / retinopathy screening: 09-2021 Last diabetic foot exam: None   Huey Romans [facility] Clinical Pharmacist Assistant [phone]

## 2022-07-18 ENCOUNTER — Ambulatory Visit: Payer: Medicare Other

## 2022-07-18 NOTE — Progress Notes (Signed)
Care Management & Coordination Services Pharmacy Note  07/18/2022 Name:  [name] Wright MRN:  [id number] DOB:  May 04, 1965  Summary: Patient reports that she her knee has been hurting and she has not been exercising. She also reports that she has been eating horribly over the past couple of weeks due to the steroids and her leg pain.   Recommendations/Changes made from today's visit: Follow up with patient about how she is doing with her Ozempic dose increase.   Follow up plan: Recommend patient continue current medication regimen  Patient to eat smaller meals, at least cutting back. She is going to start looking into meal plans that are easy for herself and her husband.  She is going to start making sure that they have vegetables and protein for at least one meal per day.    Subjective: [name] Wright is an 57 y.o. year old female who is a primary patient of Dorothyann Peng, MD.  The care coordination team was consulted for assistance with disease management and care coordination needs.    Engaged with patient by telephone for follow up visit.  Recent office visits:  06-11-2022 Dorothyann Peng, MD. Glucose= 182. A1C= 7.1. Increase ozempic 0.5 mg weekly to 1 mg weekly. Start Diflucan 150 mg One tab po qd, repeat in 48 hours. Stop jardiance, famotidine. Completed bactrim.    05-15-2022 Arnette Felts, FNP. Abnormal UA. Start bactrim 800-160 mg twice daily. Completed Tessalon, ciprodex, lotrisone and diflucan.   Recent consult visits:  06-28-2022 Vergia Alberts (allergist). Visit for vaccine.   06-01-2022 Charlesetta Shanks (Anesthesiology). Unable to view encounter.   Hospital visits:  None in previous 6 months   Objective:  Lab Results  Component Value Date   CREATININE 0.72 06/18/2022   BUN 11 06/18/2022   GFR 99.36 07/14/2020   EGFR 99 06/18/2022   GFRNONAA 79 11/10/2019   GFRAA 91 11/10/2019   NA 137 06/18/2022   K 4.4 06/18/2022   CALCIUM 9.1 06/18/2022    CO2 22 06/18/2022   GLUCOSE 182 (H) 06/18/2022    Lab Results  Component Value Date/Time   HGBA1C 7.1 (H) 06/18/2022 11:30 AM   HGBA1C 7.3 (H) 02/27/2022 12:04 PM   GFR 99.36 07/14/2020 11:41 AM   GFR 101.32 09/01/2015 10:47 AM   MICROALBUR 10 02/07/2021 12:22 PM   MICROALBUR 10 10/05/2020 11:29 AM    Last diabetic Eye exam:  Lab Results  Component Value Date/Time   HMDIABEYEEXA No Retinopathy 03/21/2021 12:00 AM    Last diabetic Foot exam: No results found for: "HMDIABFOOTEX"   Lab Results  Component Value Date   CHOL 179 10/18/2021   HDL 51 10/18/2021   LDLCALC 106 (H) 10/18/2021   TRIG 121 10/18/2021   CHOLHDL 3.5 10/18/2021       Latest Ref Rng & Units 06/18/2022   11:30 AM 02/27/2022   12:04 PM 10/18/2021    4:24 PM  Hepatic Function  Total Protein 6.0 - 8.5 g/dL 6.9  7.2  7.3   Albumin 3.8 - 4.9 g/dL 3.8  4.2  4.3   AST 0 - 40 IU/L 26  30  31    ALT 0 - 32 IU/L 23  31  29    Alk Phosphatase 44 - 121 IU/L 118  118  110   Total Bilirubin 0.0 - 1.2 mg/dL 0.3  0.4  0.3     Lab Results  Component Value Date/Time   TSH 1.400 02/27/2022 12:04 PM   TSH 1.220 10/02/2021 02:44  PM   FREET4 1.50 02/27/2022 12:04 PM   FREET4 1.47 10/05/2020 11:22 AM       Latest Ref Rng & Units 02/27/2022   12:04 PM 02/07/2021   12:07 PM 07/14/2020   11:41 AM  CBC  WBC 3.4 - 10.8 x10E3/uL 6.6  7.9  7.9   Hemoglobin 11.1 - 15.9 g/dL 09.8  11.9  14.7   Hematocrit 34.0 - 46.6 % 42.1  41.8  40.7   Platelets 150 - 450 x10E3/uL 327  312  283.0     Lab Results  Component Value Date/Time   VD25OH 28.9 (L) 02/27/2022 12:04 PM   VD25OH 22.5 (L) 01/25/2020 12:10 PM   VITAMINB12 513 02/27/2022 12:04 PM   VITAMINB12 533 10/18/2021 04:24 PM    Clinical ASCVD: No  The 10-year ASCVD risk score (Arnett DK, et al., 2019) is: 7%   Values used to calculate the score:     Age: 57 years     Sex: Female     Is Non-Hispanic African American: No     Diabetic: Yes     Tobacco smoker: No      Systolic Blood Pressure: 154 mmHg     Is BP treated: Yes     HDL Cholesterol: 51 mg/dL     Total Cholesterol: 179 mg/dL        11/08/9560    1:30 PM 10/18/2021    2:55 PM 10/05/2020   10:23 AM  Depression screen PHQ 2/9  Decreased Interest 0 0 0  Down, Depressed, Hopeless 0 0 0  PHQ - 2 Score 0 0 0     Social History   Tobacco Use  Smoking Status Never  Smokeless Tobacco Never   BP Readings from Last 3 Encounters:  05/15/22 (!) 142/98  04/17/22 (!) 138/90  02/27/22 134/80   Pulse Readings from Last 3 Encounters:  05/15/22 97  04/17/22 86  02/27/22 79   Wt Readings from Last 3 Encounters:  04/17/22 (!) 379 lb 3.2 oz (172 kg)  02/27/22 (!) 379 lb 9.6 oz (172.2 kg)  10/18/21 (!) 382 lb (173.3 kg)   BMI Readings from Last 3 Encounters:  05/15/22 61.20 kg/m  04/17/22 61.20 kg/m  02/27/22 61.27 kg/m    Allergies  Allergen Reactions   Doxycycline Anaphylaxis, Diarrhea and Nausea And Vomiting    headache   Oxycodone-Acetaminophen Hives and Nausea And Vomiting   Prochlorperazine Edisylate Other (See Comments)    Hyper and jitteriness   Amoxicillin Rash and Other (See Comments)    Has patient had a PCN reaction causing immediate rash, facial/tongue/throat swelling, SOB or lightheadedness with hypotension: Unknown Has patient had a PCN reaction causing severe rash involving mucus membranes or skin necrosis: Unknown Has patient had a PCN reaction that required hospitalization: No Has patient had a PCN reaction occurring within the last 10 years: Yes If all of the above answers are "NO", then may proceed with Cephalosporin use.    Compazine Other (See Comments)    Hyper/jitters/blotches   Eszopiclone Other (See Comments)    Metallic taste and became un effective    Keppra [Levetiracetam] Other (See Comments)    hyperactive   Latex    Levetiracetam Other (See Comments)    Jitters/hyper   Neurontin [Gabapentin] Nausea And Vomiting   Propoxyphene Hives   Robitussin  Dm Max Day-Night Hives   Tape     PAPER TAPE-skin burns/severe irritation  PATIENT DOES NOT TOLERATE PAPER TAPE   Trileptal [Oxcarbazepine] Other (See  Comments)    REACTION: immune system suppressed   Bactrim [Sulfamethoxazole-Trimethoprim] Rash   Bupropion Rash   Codeine Rash   Hydrocodone-Acetaminophen Rash   Moxifloxacin Swelling and Rash   Propoxyphene N-Acetaminophen Nausea And Vomiting and Rash   Vicodin [Hydrocodone-Acetaminophen] Rash    Medications Reviewed Today     Reviewed by Dorothyann Peng, MD (Physician) on 06/11/22 at 1032  Med List Status: <None>   Medication Order Taking? Sig Documenting Provider Last Dose Status Informant  albuterol (PROAIR HFA) 108 (90 Base) MCG/ACT inhaler [id number] Yes Inhale 2 puffs into the lungs every 4 (four) hours as needed for wheezing or shortness of breath. Dorothyann Peng, MD Taking Active   azelastine (ASTELIN) 0.1 % nasal spray [id number] Yes Place 2 sprays into both nostrils 2 (two) times daily. Use in each nostril as directed Arnette Felts, FNP Taking Active   baclofen (LIORESAL) 10 MG tablet [id number] Yes Take by mouth. [provider] Taking Active   butalbital-acetaminophen-caffeine (FIORICET) 50-325-40 MG tablet [id number] Yes Take 1 tablet by mouth every 4 (four) hours as needed. [provider] Taking Active   cetirizine (ZYRTEC ALLERGY) 10 MG tablet [id number] Yes Take 1 tablet (10 mg total) by mouth daily. Junie Spencer, FNP Taking Active   diphenhydrAMINE (BENADRYL) 25 mg capsule [id number] Yes Take 25 mg by mouth every 6 (six) hours as needed for allergies.  [provider] Taking Active Self  empagliflozin (JARDIANCE) 10 MG TABS tablet [id number] Yes Take 1 tablet (10 mg total) by mouth daily before breakfast. Dorothyann Peng, MD Taking Active   EPINEPHrine 0.3 mg/0.3 mL IJ SOAJ injection [id number] Yes Inject 0.3 mg into the muscle See admin instructions. [provider] Taking Active    fluticasone (FLONASE) 50 MCG/ACT nasal spray [id number] Yes Place 1 spray into both nostrils in the morning. [provider] Taking Active Self  furosemide (LASIX) 80 MG tablet [id number] Yes TAKE 1 TABLET BY MOUTH ONCE DAILY AS NEEDED FOR FLUID Dorothyann Peng, MD Taking Active   Insulin Pen Needle (PEN NEEDLES) 32G X 6 MM MISC [id number] Yes 1 each by Does not apply route once a week. Arnette Felts, FNP Taking Active   levothyroxine (SYNTHROID) 100 MCG tablet [id number] Yes TAKE 1 TABLET BY MOUTH BEFORE Kathe Mariner, MD Taking Active   Magnesium 400 MG TABS [id number] Yes Take 1 tablet by mouth daily. [provider] Taking Active Self  meclizine (ANTIVERT) 25 MG tablet [id number] Yes Take 12.5-25 mg by mouth 2 (two) times daily as needed for dizziness (vertigo). [provider] Taking Active Self  methocarbamol (ROBAXIN) 750 MG tablet [id number] Yes Take 500 mg by mouth every 6 (six) hours as needed for muscle spasms. 1 at bedtime [provider] Taking Active Self  morphine (MSIR) 15 MG tablet [id number] Yes Take 15 mg by mouth 4 (four) times daily as needed for severe pain.  [provider] Taking Active Self  Multiple Vitamins-Minerals (MAXIMUM DAILY GREEN PO) [id number] Yes Take 1 Package by mouth 3 (three) times a week. It Works! Greens [provider] Taking Active Self  OZEMPIC, 0.25 OR 0.5 MG/DOSE, 2 MG/3ML SOPN [id number] Yes Inject 0.5 mg into the skin once a week. [provider] Taking Active   promethazine (PHENERGAN) 25 MG tablet [id number] Yes TAKE 1/2 TO 1 (ONE-HALF TO ONE) TABLET BY MOUTH EVERY 8 HOURS AS NEEDED FOR NAUSEA Esterwood, Amy S, PA-C Taking Active   RESTASIS 0.05 % ophthalmic emulsion [id number] Yes Place 1 drop into both  eyes 2 (two) times daily. [provider] Taking Active Self  SUDOGEST MAXIMUM STRENGTH 30 MG tablet [id number] Yes TAKE 1 TABLET BY MOUTH TWICE DAILY AS NEEDED FOR CONGESTION Arnette FeltsMoore,  Janece, FNP Taking Active   traMADol (ULTRAM) 50 MG tablet [id number] Yes TAKE 1 TABLET BY MOUTH EVERY 6 HOURS AS NEEDED Esterwood, Amy S, PA-C Taking Active   Vitamin D, Ergocalciferol, (DRISDOL) 1.25 MG (50000 UNIT) CAPS capsule [id number] Yes Take 1 capsule (50,000 Units total) by mouth every 7 (seven) days. Dorothyann PengSanders, Robyn, MD Taking Active             SDOH:  (Social Determinants of Health) assessments and interventions performed: No SDOH Interventions    Flowsheet Row [facility]  SDOH Interventions     Depression Interventions/Treatment  -- PHQ2-9 Score <4 Follow-up Not Indicated PHQ2-9 Score <4 Follow-up Not Indicated  Financial Strain Interventions Other (Comment)  [Begin enrollment for patient assistance for Ozempic due to cost] -- --       Medication Assistance: Application for Ozempic  medication assistance program. in process.  Anticipated assistance start date 08/08/2022.  See plan of care for additional detail.  Medication Access: Within the past 30 days, how often has patient missed a dose of medication? None  Is a pillbox or other method used to improve adherence? No  Factors that may affect medication adherence? financial need, nonadherence to medications, and lack of understanding of disease management Are meds synced by current pharmacy? No  Are meds delivered by current pharmacy? No  Does patient experience delays in picking up medications due to transportation concerns? No   [facility] Services Reviewed: Is patient disadvantaged to use [facility]?: No  Current Rx insurance plan: [employer] Name and location of Current pharmacy:  [facility] [address], [place] - [id number] [place] [address] Phone: [phone] Fax: [phone]  [facility] services reviewed with patient today?: No  Patient requests to transfer care to [facility]?: No  Reason patient declined to change pharmacies: Not mentioned at this visit  Compliance/Adherence/Medication fill history: Care Gaps: Annual wellness visit in last year? Yes   If Diabetic: Last eye exam / retinopathy screening: 09-2021 Last diabetic foot exam: None  Star-Rating Drugs: Ozempic 1 mg- Last filled 07-06-2022 [facility]. Previous 06-06-2022 28 DS.    Assessment/Plan   Diabetes (A1c goal <7%) -Not ideally controlled -Current medications: Ozempic 1 mg once per week on Sunday Appropriate, Effective, Safe, Accessible -Medications previously tried: Jardiance 25 mg tablet due to yeast infections   -Current home glucose readings - she is checking her BS at home  fasting glucose: 130-150- 172 -Denies hypoglycemic/hyperglycemic symptoms -Current meal patterns: she has started cooking more at home lunch: Terrill MohrSara Lee 45 Carbohydrate with cheese   dinner: chicken tenders, between 5-10, and waffle fries - air fryer, to make it crispy she sprayed it with olive  drinks: she is drinking about 4 bottles water per day  -Current exercise: she is not currently exercising since she hurt her knee  -Educated on A1c and blood sugar goals; Carbohydrate counting and/or plate method -Counseled to check feet daily and get yearly eye exams -Patient reports that she has started a steroid that is impacting her eating habits. -Patient to eat smaller meals,  at least cutting back. She is going to start looking into meal plans that are easy for herself and her husband.  -She has increased her Ozempic to 1 mg and that has impacted how she feels.  -Counseled on diet and exercise extensively Recommended to continue current medication,  patient to bring in patient assistance paperwork and documentation   Cherylin Mylar,  CPP, PharmD Clinical Pharmacist Practitioner [facility] [phone]

## 2022-07-19 ENCOUNTER — Encounter: Payer: Self-pay | Admitting: Genetic Counselor

## 2022-07-19 NOTE — Progress Notes (Signed)
UPDATE: [id number] has been reclassified as likely benign.  The amended report date is July 03, 2022.

## 2022-07-31 DIAGNOSIS — R531 Weakness: Secondary | ICD-10-CM | POA: Diagnosis not present

## 2022-07-31 DIAGNOSIS — M170 Bilateral primary osteoarthritis of knee: Secondary | ICD-10-CM | POA: Diagnosis not present

## 2022-07-31 DIAGNOSIS — M25661 Stiffness of right knee, not elsewhere classified: Secondary | ICD-10-CM | POA: Diagnosis not present

## 2022-07-31 DIAGNOSIS — M25662 Stiffness of left knee, not elsewhere classified: Secondary | ICD-10-CM | POA: Diagnosis not present

## 2022-08-06 ENCOUNTER — Encounter: Payer: Self-pay | Admitting: Internal Medicine

## 2022-08-07 DIAGNOSIS — Z79891 Long term (current) use of opiate analgesic: Secondary | ICD-10-CM | POA: Diagnosis not present

## 2022-08-07 DIAGNOSIS — G894 Chronic pain syndrome: Secondary | ICD-10-CM | POA: Diagnosis not present

## 2022-08-07 DIAGNOSIS — M25561 Pain in right knee: Secondary | ICD-10-CM | POA: Diagnosis not present

## 2022-08-07 DIAGNOSIS — R202 Paresthesia of skin: Secondary | ICD-10-CM | POA: Diagnosis not present

## 2022-08-07 DIAGNOSIS — M25562 Pain in left knee: Secondary | ICD-10-CM | POA: Diagnosis not present

## 2022-08-07 DIAGNOSIS — M542 Cervicalgia: Secondary | ICD-10-CM | POA: Diagnosis not present

## 2022-08-07 DIAGNOSIS — M7918 Myalgia, other site: Secondary | ICD-10-CM | POA: Diagnosis not present

## 2022-08-07 DIAGNOSIS — G4733 Obstructive sleep apnea (adult) (pediatric): Secondary | ICD-10-CM | POA: Diagnosis not present

## 2022-08-07 DIAGNOSIS — R519 Headache, unspecified: Secondary | ICD-10-CM | POA: Diagnosis not present

## 2022-08-07 DIAGNOSIS — Z5181 Encounter for therapeutic drug level monitoring: Secondary | ICD-10-CM | POA: Diagnosis not present

## 2022-08-08 DIAGNOSIS — M170 Bilateral primary osteoarthritis of knee: Secondary | ICD-10-CM | POA: Diagnosis not present

## 2022-08-08 DIAGNOSIS — M25661 Stiffness of right knee, not elsewhere classified: Secondary | ICD-10-CM | POA: Diagnosis not present

## 2022-08-08 DIAGNOSIS — R531 Weakness: Secondary | ICD-10-CM | POA: Diagnosis not present

## 2022-08-08 DIAGNOSIS — M25662 Stiffness of left knee, not elsewhere classified: Secondary | ICD-10-CM | POA: Diagnosis not present

## 2022-08-10 ENCOUNTER — Telehealth: Payer: Self-pay

## 2022-08-10 DIAGNOSIS — R531 Weakness: Secondary | ICD-10-CM | POA: Diagnosis not present

## 2022-08-10 DIAGNOSIS — M25661 Stiffness of right knee, not elsewhere classified: Secondary | ICD-10-CM | POA: Diagnosis not present

## 2022-08-10 DIAGNOSIS — M25662 Stiffness of left knee, not elsewhere classified: Secondary | ICD-10-CM | POA: Diagnosis not present

## 2022-08-10 DIAGNOSIS — M170 Bilateral primary osteoarthritis of knee: Secondary | ICD-10-CM | POA: Diagnosis not present

## 2022-08-10 NOTE — Progress Notes (Signed)
Care Management & Coordination Services Pharmacy Team  Reason for Encounter: Appointment Reminder  Contacted patient to confirm telephone appointment with Cherylin Mylar, PharmD on 08-14-2022 at 10:00. Spoke with patient on 08/10/2022   Do you have any problems getting your medications? No  What is your top health concern you would like to discuss at your upcoming visit? Patient stated none  Have you seen any other providers since your last visit with PCP? Yes   Chart review: Recent office visits:  None  Recent consult visits:  None  Hospital visits:  None in previous 6 months   Star Rating Drugs:  Ozempic 1 mg- Last filled 07-06-2022 [facility]. Previous 06-06-2022 28 DS. (Patient has samples but still hasn't turned documents needed )  Care Gaps: Annual wellness visit in last year? Yes Shingrix overdue Covid vaccine overdue  If Diabetic: Last eye exam / retinopathy screening:09-2021  Last diabetic foot exam: None   Huey Romans [facility] Clinical Pharmacist Assistant [phone]

## 2022-08-13 DIAGNOSIS — M1712 Unilateral primary osteoarthritis, left knee: Secondary | ICD-10-CM | POA: Diagnosis not present

## 2022-08-13 DIAGNOSIS — M170 Bilateral primary osteoarthritis of knee: Secondary | ICD-10-CM | POA: Diagnosis not present

## 2022-08-13 DIAGNOSIS — M1711 Unilateral primary osteoarthritis, right knee: Secondary | ICD-10-CM | POA: Diagnosis not present

## 2022-08-14 ENCOUNTER — Ambulatory Visit: Payer: Medicare Other

## 2022-08-14 NOTE — Progress Notes (Signed)
Care Management & Coordination Services Pharmacy Note  08/14/2022 Name:  [name] Wright MRN:  [id number] DOB:  Feb 25, 1966  Summary: Patient reports that she is checking her BP at home. Patient reports [facility] specialist recommended going through PCP for help with pain medication cost. She reports that she is purchasing it but sometimes its a bit expensive. I explained to the patient that she should reach out to the specialist team for some help.   Recommendations/Changes made from today's visit: Recommend patient be started on an ACE/ARB due to consecutive readings greater than 130/80.  Recommend patient follow up with pain medication specialist in regards to cost of medication that is prescribed.   Follow up plan: Recommend patient check BP at least twice per day and bring readings to her next visit in office for the next two months.  Briefly reviewed [employer] patient website and determined that patient assistance is not available but they do have a direct saving program that will reduce the co pay.  Information given to patient to give to the team via my chart.  Patient needs to bring in financial documentation for patient assistance approval    Subjective: [name] Wright is an 57 y.o. year old female who is a primary patient of Dorothyann Peng, MD.  The care coordination team was consulted for assistance with disease management and care coordination needs.    Engaged with patient by telephone for follow up visit.  Recent office visits:  None   Recent consult visits:  None   Hospital visits:  None in previous 6 months   Objective:  Lab Results  Component Value Date   CREATININE 0.72 06/18/2022   BUN 11 06/18/2022   GFR 99.36 07/14/2020   EGFR 99 06/18/2022   GFRNONAA 79 11/10/2019   GFRAA 91 11/10/2019   NA 137 06/18/2022   K 4.4 06/18/2022   CALCIUM 9.1 06/18/2022   CO2 22 06/18/2022   GLUCOSE 182 (H) 06/18/2022    Lab Results  Component Value Date/Time    HGBA1C 7.1 (H) 06/18/2022 11:30 AM   HGBA1C 7.3 (H) 02/27/2022 12:04 PM   GFR 99.36 07/14/2020 11:41 AM   GFR 101.32 09/01/2015 10:47 AM   MICROALBUR 10 02/07/2021 12:22 PM   MICROALBUR 10 10/05/2020 11:29 AM    Last diabetic Eye exam:  Lab Results  Component Value Date/Time   HMDIABEYEEXA No Retinopathy 03/21/2021 12:00 AM    Last diabetic Foot exam: No results found for: "HMDIABFOOTEX"   Lab Results  Component Value Date   CHOL 179 10/18/2021   HDL 51 10/18/2021   LDLCALC 106 (H) 10/18/2021   TRIG 121 10/18/2021   CHOLHDL 3.5 10/18/2021       Latest Ref Rng & Units 06/18/2022   11:30 AM 02/27/2022   12:04 PM 10/18/2021    4:24 PM  Hepatic Function  Total Protein 6.0 - 8.5 g/dL 6.9  7.2  7.3   Albumin 3.8 - 4.9 g/dL 3.8  4.2  4.3   AST 0 - 40 IU/L 26  30  31    ALT 0 - 32 IU/L 23  31  29    Alk Phosphatase 44 - 121 IU/L 118  118  110   Total Bilirubin 0.0 - 1.2 mg/dL 0.3  0.4  0.3     Lab Results  Component Value Date/Time   TSH 1.400 02/27/2022 12:04 PM   TSH 1.220 10/02/2021 02:44 PM   FREET4 1.50 02/27/2022 12:04 PM   FREET4 1.47 10/05/2020 11:22 AM  Latest Ref Rng & Units 02/27/2022   12:04 PM 02/07/2021   12:07 PM 07/14/2020   11:41 AM  CBC  WBC 3.4 - 10.8 x10E3/uL 6.6  7.9  7.9   Hemoglobin 11.1 - 15.9 g/dL 52.8  41.3  24.4   Hematocrit 34.0 - 46.6 % 42.1  41.8  40.7   Platelets 150 - 450 x10E3/uL 327  312  283.0     Lab Results  Component Value Date/Time   VD25OH 28.9 (L) 02/27/2022 12:04 PM   VD25OH 22.5 (L) 01/25/2020 12:10 PM   VITAMINB12 513 02/27/2022 12:04 PM   VITAMINB12 533 10/18/2021 04:24 PM    Clinical ASCVD: No  The 10-year ASCVD risk score (Arnett DK, et al., 2019) is: 6.6%   Values used to calculate the score:     Age: 27 years     Sex: Female     Is Non-Hispanic African American: No     Diabetic: Yes     Tobacco smoker: No     Systolic Blood Pressure: 149 mmHg     Is BP treated: Yes     HDL Cholesterol: 51 mg/dL      Total Cholesterol: 179 mg/dL        0/04/270    5:36 PM 10/18/2021    2:55 PM 10/05/2020   10:23 AM  Depression screen PHQ 2/9  Decreased Interest 0 0 0  Down, Depressed, Hopeless 0 0 0  PHQ - 2 Score 0 0 0     Social History   Tobacco Use  Smoking Status Never  Smokeless Tobacco Never   BP Readings from Last 3 Encounters:  05/15/22 (!) 142/98  04/17/22 (!) 138/90  02/27/22 134/80   Pulse Readings from Last 3 Encounters:  05/15/22 97  04/17/22 86  02/27/22 79   Wt Readings from Last 3 Encounters:  04/17/22 (!) 379 lb 3.2 oz (172 kg)  02/27/22 (!) 379 lb 9.6 oz (172.2 kg)  10/18/21 (!) 382 lb (173.3 kg)   BMI Readings from Last 3 Encounters:  05/15/22 61.20 kg/m  04/17/22 61.20 kg/m  02/27/22 61.27 kg/m    Allergies  Allergen Reactions   Doxycycline Anaphylaxis, Diarrhea and Nausea And Vomiting    headache   Oxycodone-Acetaminophen Hives and Nausea And Vomiting   Prochlorperazine Edisylate Other (See Comments)    Hyper and jitteriness   Amoxicillin Rash and Other (See Comments)    Has patient had a PCN reaction causing immediate rash, facial/tongue/throat swelling, SOB or lightheadedness with hypotension: Unknown Has patient had a PCN reaction causing severe rash involving mucus membranes or skin necrosis: Unknown Has patient had a PCN reaction that required hospitalization: No Has patient had a PCN reaction occurring within the last 10 years: Yes If all of the above answers are "NO", then may proceed with Cephalosporin use.    Compazine Other (See Comments)    Hyper/jitters/blotches   Eszopiclone Other (See Comments)    Metallic taste and became un effective    Keppra [Levetiracetam] Other (See Comments)    hyperactive   Latex    Levetiracetam Other (See Comments)    Jitters/hyper   Neurontin [Gabapentin] Nausea And Vomiting   Propoxyphene Hives   Robitussin Dm Max Day-Night Hives   Tape     PAPER TAPE-skin burns/severe irritation  PATIENT DOES  NOT TOLERATE PAPER TAPE   Trileptal [Oxcarbazepine] Other (See Comments)    REACTION: immune system suppressed   Bactrim [Sulfamethoxazole-Trimethoprim] Rash   Bupropion Rash   Codeine Rash  Hydrocodone-Acetaminophen Rash   Moxifloxacin Swelling and Rash   Propoxyphene N-Acetaminophen Nausea And Vomiting and Rash   Vicodin [Hydrocodone-Acetaminophen] Rash    Medications Reviewed Today     Reviewed by Harlan Stains, RPH (Pharmacist) on 07/18/22 at 1125  Med List Status: <None>   Medication Order Taking? Sig Documenting Provider Last Dose Status Informant  albuterol (PROAIR HFA) 108 (90 Base) MCG/ACT inhaler [id number] No Inhale 2 puffs into the lungs every 4 (four) hours as needed for wheezing or shortness of breath. Dorothyann Peng, MD Taking Active   azelastine (ASTELIN) 0.1 % nasal spray [id number] No Place 2 sprays into both nostrils 2 (two) times daily. Use in each nostril as directed Arnette Felts, FNP Taking Active   baclofen (LIORESAL) 10 MG tablet [id number] No Take by mouth. [provider] Taking Active   butalbital-acetaminophen-caffeine (FIORICET) 50-325-40 MG tablet [id number] No Take 1 tablet by mouth every 4 (four) hours as needed. [provider] Taking Active   cetirizine (ZYRTEC ALLERGY) 10 MG tablet [id number] No Take 1 tablet (10 mg total) by mouth daily. Junie Spencer, FNP Taking Active   diphenhydrAMINE (BENADRYL) 25 mg capsule [id number] No Take 25 mg by mouth every 6 (six) hours as needed for allergies.  [provider] Taking Active Self  EPINEPHrine 0.3 mg/0.3 mL IJ SOAJ injection [id number] No Inject 0.3 mg into the muscle See admin instructions. [provider] Taking Active   fluconazole (DIFLUCAN) 150 MG tablet [id number]  One tab po qd, repeat in 48 hours Dorothyann Peng, MD  Active   fluticasone [facility]) 50 MCG/ACT nasal spray [id number] No Place 1 spray into both nostrils in the morning. [provider] Taking Active  Self  furosemide (LASIX) 80 MG tablet [id number] No TAKE 1 TABLET BY MOUTH ONCE DAILY AS NEEDED FOR FLUID Dorothyann Peng, MD Taking Active   Insulin Pen Needle (PEN NEEDLES) 32G X 6 MM MISC [id number] No 1 each by Does not apply route once a week. Arnette Felts, FNP Taking Active   levothyroxine (SYNTHROID) 100 MCG tablet [id number]  TAKE 1 TABLET BY MOUTH BEFORE Kathe Mariner, MD  Active   Magnesium 400 MG TABS [id number] No Take 1 tablet by mouth daily. [provider] Taking Active Self  meclizine (ANTIVERT) 25 MG tablet [id number] No Take 12.5-25 mg by mouth 2 (two) times daily as needed for dizziness (vertigo). [provider] Taking Active Self  methocarbamol (ROBAXIN) 750 MG tablet [id number] No Take 500 mg by mouth every 6 (six) hours as needed for muscle spasms. 1 at bedtime [provider] Taking Active Self  morphine (MSIR) 15 MG tablet [id number] No Take 15 mg by mouth 4 (four) times daily as needed for severe pain.  [provider] Taking Active Self  Multiple Vitamins-Minerals (MAXIMUM DAILY GREEN PO) [id number] No Take 1 Package by mouth 3 (three) times a week. It Works! Greens [provider] Taking Active Self  promethazine (PHENERGAN) 25 MG tablet [id number] No TAKE 1/2 TO 1 (ONE-HALF TO ONE) TABLET BY MOUTH EVERY 8 HOURS AS NEEDED FOR NAUSEA Esterwood, Amy S, PA-C Taking Active   RESTASIS 0.05 % ophthalmic emulsion [id number] No Place 1 drop into both eyes 2 (two) times daily. [provider] Taking Active Self  Semaglutide, 1 MG/DOSE, 4 MG/3ML SOPN [id number]  Inject 1 mg into the skin once a week. Dorothyann Peng, MD  Active   SUDOGEST MAXIMUM STRENGTH 30 MG tablet [id number] No TAKE 1 TABLET BY MOUTH TWICE DAILY AS  NEEDED FOR CONGESTION Arnette Felts, FNP Taking Active   traMADol (ULTRAM) 50 MG tablet [id number] No TAKE 1 TABLET BY MOUTH EVERY 6 HOURS AS NEEDED Esterwood, Amy S, PA-C Taking Active   Vitamin D, Ergocalciferol,  (DRISDOL) 1.25 MG (50000 UNIT) CAPS capsule [id number] No Take 1 capsule (50,000 Units total) by mouth every 7 (seven) days. Dorothyann Peng, MD Taking Active             SDOH:  (Social Determinants of Health) assessments and interventions performed: No SDOH Interventions    Flowsheet Row Chronic Care Management from 11/15/2021 in [facility] Clinical Support from 08/13/2019 in [facility] Clinical Support from 08/12/2018 in [facility]  SDOH Interventions     Depression Interventions/Treatment  -- NWG9-5 Score <4 Follow-up Not Indicated PHQ2-9 Score <4 Follow-up Not Indicated  Financial Strain Interventions Other (Comment)  [Begin enrollment for patient assistance for Ozempic due to cost] -- --       Medication Assistance:  Ozempic obtained through [employer] medication assistance program.  Enrollment ends 03/2023  Medication Access: Within the past 30 days, how often has patient missed a dose of medication? No Is a pillbox or other method used to improve adherence? No  Factors that may affect medication adherence? nonadherence to medications, adverse effects of medications, and lack of understanding of disease management Are meds synced by current pharmacy? No  Are meds delivered by current pharmacy? No  Does patient experience delays in picking up medications due to transportation concerns? No   [facility] Reviewed: Is patient disadvantaged to use [facility]?: No  Current Rx insurance plan: [facility] Medicare  Name and location of Current pharmacy:  [facility] [address], [place] - [address] [place] [address] Phone: [phone] Fax: [phone]  [facility] services reviewed with patient today?: No   Compliance/Adherence/Medication fill history: Care Gaps: EYE Exam - patient seen by Malva Cogan Surgeon    Star-Rating Drugs: Ozempic 1 mg- Last filled 07-06-2022 28 [facility]. Previous 06-06-2022 28 DS. (Patient has samples but still hasn't turned in documents needed )    Assessment/Plan   Hypertension (BP goal <130/80) -Uncontrolled -Current treatment: Furosemide 80 mg tablet once per day as needed for fluid Appropriate, Query effective -Current home readings: 120-135/70-90  -Current dietary habits: patient did not discuss  -Current exercise habits: she is going to physical therapy twice per week, she is unable to do any other exercise besides this at this time  -Denies hypotensive/hypertensive symptoms -Patient is still refusing to take any medication for her HTN because at this time she does not feel like the readings are real or normal.  -I have discussed in great detail the importance of a low sodium diet, and the need for her to lose weight.  -Educated on BP goals and benefits of medications for prevention of heart attack, stroke and kidney damage; Importance of home blood pressure monitoring; -Counseled to monitor BP at home at least twice per day, document, and provide log at future appointments -Recommended patient be started on ACE/ARB due to DM II.   Cherylin Mylar, CPP, PharmD Clinical Pharmacist Practitioner [facility] [phone]

## 2022-08-20 DIAGNOSIS — M170 Bilateral primary osteoarthritis of knee: Secondary | ICD-10-CM | POA: Diagnosis not present

## 2022-08-20 DIAGNOSIS — M25662 Stiffness of left knee, not elsewhere classified: Secondary | ICD-10-CM | POA: Diagnosis not present

## 2022-08-20 DIAGNOSIS — R531 Weakness: Secondary | ICD-10-CM | POA: Diagnosis not present

## 2022-08-20 DIAGNOSIS — M25661 Stiffness of right knee, not elsewhere classified: Secondary | ICD-10-CM | POA: Diagnosis not present

## 2022-08-27 DIAGNOSIS — M25662 Stiffness of left knee, not elsewhere classified: Secondary | ICD-10-CM | POA: Diagnosis not present

## 2022-08-27 DIAGNOSIS — R531 Weakness: Secondary | ICD-10-CM | POA: Diagnosis not present

## 2022-08-27 DIAGNOSIS — M170 Bilateral primary osteoarthritis of knee: Secondary | ICD-10-CM | POA: Diagnosis not present

## 2022-08-27 DIAGNOSIS — M25661 Stiffness of right knee, not elsewhere classified: Secondary | ICD-10-CM | POA: Diagnosis not present

## 2022-08-29 DIAGNOSIS — M170 Bilateral primary osteoarthritis of knee: Secondary | ICD-10-CM | POA: Diagnosis not present

## 2022-08-29 DIAGNOSIS — M25661 Stiffness of right knee, not elsewhere classified: Secondary | ICD-10-CM | POA: Diagnosis not present

## 2022-08-29 DIAGNOSIS — R531 Weakness: Secondary | ICD-10-CM | POA: Diagnosis not present

## 2022-08-29 DIAGNOSIS — M25662 Stiffness of left knee, not elsewhere classified: Secondary | ICD-10-CM | POA: Diagnosis not present

## 2022-09-10 DIAGNOSIS — M25662 Stiffness of left knee, not elsewhere classified: Secondary | ICD-10-CM | POA: Diagnosis not present

## 2022-09-10 DIAGNOSIS — M25661 Stiffness of right knee, not elsewhere classified: Secondary | ICD-10-CM | POA: Diagnosis not present

## 2022-09-10 DIAGNOSIS — R531 Weakness: Secondary | ICD-10-CM | POA: Diagnosis not present

## 2022-09-10 DIAGNOSIS — M170 Bilateral primary osteoarthritis of knee: Secondary | ICD-10-CM | POA: Diagnosis not present

## 2022-09-19 DIAGNOSIS — R531 Weakness: Secondary | ICD-10-CM | POA: Diagnosis not present

## 2022-09-19 DIAGNOSIS — M25662 Stiffness of left knee, not elsewhere classified: Secondary | ICD-10-CM | POA: Diagnosis not present

## 2022-09-19 DIAGNOSIS — M25661 Stiffness of right knee, not elsewhere classified: Secondary | ICD-10-CM | POA: Diagnosis not present

## 2022-09-19 DIAGNOSIS — M170 Bilateral primary osteoarthritis of knee: Secondary | ICD-10-CM | POA: Diagnosis not present

## 2022-09-25 DIAGNOSIS — M25662 Stiffness of left knee, not elsewhere classified: Secondary | ICD-10-CM | POA: Diagnosis not present

## 2022-09-25 DIAGNOSIS — M170 Bilateral primary osteoarthritis of knee: Secondary | ICD-10-CM | POA: Diagnosis not present

## 2022-09-25 DIAGNOSIS — M25661 Stiffness of right knee, not elsewhere classified: Secondary | ICD-10-CM | POA: Diagnosis not present

## 2022-09-25 DIAGNOSIS — R531 Weakness: Secondary | ICD-10-CM | POA: Diagnosis not present

## 2022-09-27 DIAGNOSIS — M170 Bilateral primary osteoarthritis of knee: Secondary | ICD-10-CM | POA: Diagnosis not present

## 2022-09-27 DIAGNOSIS — M25662 Stiffness of left knee, not elsewhere classified: Secondary | ICD-10-CM | POA: Diagnosis not present

## 2022-09-27 DIAGNOSIS — R531 Weakness: Secondary | ICD-10-CM | POA: Diagnosis not present

## 2022-09-27 DIAGNOSIS — M25661 Stiffness of right knee, not elsewhere classified: Secondary | ICD-10-CM | POA: Diagnosis not present

## 2022-10-02 DIAGNOSIS — M170 Bilateral primary osteoarthritis of knee: Secondary | ICD-10-CM | POA: Diagnosis not present

## 2022-10-02 DIAGNOSIS — R531 Weakness: Secondary | ICD-10-CM | POA: Diagnosis not present

## 2022-10-02 DIAGNOSIS — M25661 Stiffness of right knee, not elsewhere classified: Secondary | ICD-10-CM | POA: Diagnosis not present

## 2022-10-02 DIAGNOSIS — M25662 Stiffness of left knee, not elsewhere classified: Secondary | ICD-10-CM | POA: Diagnosis not present

## 2022-10-04 DIAGNOSIS — M25661 Stiffness of right knee, not elsewhere classified: Secondary | ICD-10-CM | POA: Diagnosis not present

## 2022-10-04 DIAGNOSIS — M25662 Stiffness of left knee, not elsewhere classified: Secondary | ICD-10-CM | POA: Diagnosis not present

## 2022-10-04 DIAGNOSIS — R531 Weakness: Secondary | ICD-10-CM | POA: Diagnosis not present

## 2022-10-04 DIAGNOSIS — M170 Bilateral primary osteoarthritis of knee: Secondary | ICD-10-CM | POA: Diagnosis not present

## 2022-10-09 DIAGNOSIS — M170 Bilateral primary osteoarthritis of knee: Secondary | ICD-10-CM | POA: Diagnosis not present

## 2022-10-09 DIAGNOSIS — M25662 Stiffness of left knee, not elsewhere classified: Secondary | ICD-10-CM | POA: Diagnosis not present

## 2022-10-09 DIAGNOSIS — R531 Weakness: Secondary | ICD-10-CM | POA: Diagnosis not present

## 2022-10-09 DIAGNOSIS — M25661 Stiffness of right knee, not elsewhere classified: Secondary | ICD-10-CM | POA: Diagnosis not present

## 2022-10-19 DIAGNOSIS — M1711 Unilateral primary osteoarthritis, right knee: Secondary | ICD-10-CM | POA: Diagnosis not present

## 2022-10-26 DIAGNOSIS — M1711 Unilateral primary osteoarthritis, right knee: Secondary | ICD-10-CM | POA: Diagnosis not present

## 2022-10-27 ENCOUNTER — Telehealth: Payer: Medicare Other | Admitting: Nurse Practitioner

## 2022-10-27 DIAGNOSIS — N760 Acute vaginitis: Secondary | ICD-10-CM | POA: Diagnosis not present

## 2022-10-27 MED ORDER — FLUCONAZOLE 150 MG PO TABS: 150.0000 mg | ORAL_TABLET | Freq: Once | ORAL | 0 refills | Status: AC

## 2022-10-27 NOTE — Progress Notes (Signed)

## 2022-10-30 ENCOUNTER — Other Ambulatory Visit: Payer: Medicare Other | Admitting: Pharmacist

## 2022-10-30 NOTE — Patient Instructions (Signed)
Hi Beth,   We will see where we are in the process of [employer] assistance for [employer]. We'll be in touch.   Thanks!  Catie Clearance Coots, PharmD [phone]

## 2022-10-30 NOTE — Progress Notes (Signed)
Care Coordination Call  While speaking with patient's husband, she noted that she was also in the process of applying for Ozempic assistance through [employer]. Prior pharmacist documentation noted that patient was approved for 2024, but patient notes she has never received a shipment this year nor notification of approval.   Will collaborate with CPhT team to pursue assistance for Ozempic 1 mg. Patient notes sugars have bene higher over the past month when receiving injections for her knee, think she will need the 2 mg dose. Also notes gallbladder "issues", would not want to worsen risk of cholecystitis, which has sometimes been seen with GLP1s. Patient does not think her gallbladder issues have worsened since being on Ozempic.   She will follow up with Dr. Allyne Gee in a few weeks.   Catie Eppie Gibson, PharmD, BCACP, CPP Clinical Pharmacist [facility] [phone]

## 2022-10-31 ENCOUNTER — Ambulatory Visit: Payer: Medicare Other | Admitting: Internal Medicine

## 2022-10-31 ENCOUNTER — Ambulatory Visit (INDEPENDENT_AMBULATORY_CARE_PROVIDER_SITE_OTHER): Payer: Medicare Other

## 2022-10-31 VITALS — Ht 66.0 in | Wt 368.0 lb

## 2022-10-31 DIAGNOSIS — Z0000 Encounter for general adult medical examination without abnormal findings: Secondary | ICD-10-CM | POA: Diagnosis not present

## 2022-10-31 NOTE — Progress Notes (Signed)
Subjective:   [name] Wright is a 57 y.o. female who presents for Medicare Annual (Subsequent) preventive examination.  Visit Complete: Virtual  I connected with  Dani Gobble on 10/31/22 by a audio enabled telemedicine application and verified that I am speaking with the correct person using two identifiers.  Patient Location: Home  Provider Location: Office/Clinic  I discussed the limitations of evaluation and management by telemedicine. The patient expressed understanding and agreed to proceed.  Per patient no change in vitals since last visit; unable to obtain new vitals due to this being a telehealth visit.  Patient was unable to self-report vital signs via telehealth due to a lack of equipment at home.  Review of Systems     Cardiac Risk Factors include: advanced age (>28men, >29 women);diabetes mellitus;hypertension;obesity (BMI >30kg/m2)     Objective:    Today's Vitals   10/31/22 1435 10/31/22 1436  Weight: (!) 368 lb (166.9 kg)   Height: 5\' 6"  (1.676 m)   PainSc:  3    Body mass index is 59.4 kg/m.     10/31/2022    2:51 PM 10/18/2021    2:54 PM 10/05/2020   10:21 AM 08/25/2020    7:25 AM 08/13/2019    9:23 AM 06/12/2019   10:35 AM 08/12/2018    9:20 AM  Advanced Directives  Does Patient Have a Medical Advance Directive? No No No Yes No No No  Type of Advance Directive    Living will     Would patient like information on creating a medical advance directive?  No - Patient declined No - Patient declined  No - Patient declined No - Patient declined     Current Medications (verified) Outpatient Encounter Medications as of 10/31/2022  Medication Sig   albuterol (PROAIR HFA) 108 (90 Base) MCG/ACT inhaler Inhale 2 puffs into the lungs every 4 (four) hours as needed for wheezing or shortness of breath.   azelastine (ASTELIN) 0.1 % nasal spray Place 2 sprays into both nostrils 2 (two) times daily. Use in each nostril as directed   baclofen (LIORESAL) 10 MG  tablet Take by mouth.   butalbital-acetaminophen-caffeine (FIORICET) 50-325-40 MG tablet Take 1 tablet by mouth every 4 (four) hours as needed.   cetirizine (ZYRTEC ALLERGY) 10 MG tablet Take 1 tablet (10 mg total) by mouth daily.   diphenhydrAMINE (BENADRYL) 25 mg capsule Take 25 mg by mouth every 6 (six) hours as needed for allergies.    EPINEPHrine 0.3 mg/0.3 mL IJ SOAJ injection Inject 0.3 mg into the muscle See admin instructions.   fluconazole (DIFLUCAN) 150 MG tablet One tab po qd, repeat in 48 hours   fluticasone (FLONASE) 50 MCG/ACT nasal spray Place 1 spray into both nostrils in the morning.   furosemide (LASIX) 80 MG tablet TAKE 1 TABLET BY MOUTH ONCE DAILY AS NEEDED FOR FLUID   Insulin Pen Needle (PEN NEEDLES) 32G X 6 MM MISC 1 each by Does not apply route once a week.   levothyroxine (SYNTHROID) 100 MCG tablet TAKE 1 TABLET BY MOUTH BEFORE BREAKFAST   Magnesium 400 MG TABS Take 1 tablet by mouth daily.   meclizine (ANTIVERT) 25 MG tablet Take 12.5-25 mg by mouth 2 (two) times daily as needed for dizziness (vertigo).   methocarbamol (ROBAXIN) 750 MG tablet Take 500 mg by mouth every 6 (six) hours as needed for muscle spasms. 1 at bedtime   morphine (MSIR) 15 MG tablet Take 15 mg by mouth 4 (four) times daily  as needed for severe pain.    Multiple Vitamins-Minerals (MAXIMUM DAILY GREEN PO) Take 1 Package by mouth 3 (three) times a week. It Works! Greens   promethazine (PHENERGAN) 25 MG tablet TAKE 1/2 TO 1 (ONE-HALF TO ONE) TABLET BY MOUTH EVERY 8 HOURS AS NEEDED FOR NAUSEA   RESTASIS 0.05 % ophthalmic emulsion Place 1 drop into both eyes 2 (two) times daily.   Semaglutide, 1 MG/DOSE, 4 MG/3ML SOPN Inject 1 mg into the skin once a week.   SUDOGEST MAXIMUM STRENGTH 30 MG tablet TAKE 1 TABLET BY MOUTH TWICE DAILY AS NEEDED FOR CONGESTION   traMADol (ULTRAM) 50 MG tablet TAKE 1 TABLET BY MOUTH EVERY 6 HOURS AS NEEDED   Vitamin D, Ergocalciferol, (DRISDOL) 1.25 MG (50000 UNIT) CAPS capsule  Take 1 capsule (50,000 Units total) by mouth every 7 (seven) days.   No facility-administered encounter medications on file as of 10/31/2022.    Allergies (verified) Doxycycline, Oxycodone-acetaminophen, Prochlorperazine edisylate, Amoxicillin, Compazine, Eszopiclone, Keppra [levetiracetam], Latex, Levetiracetam, Neurontin [gabapentin], Propoxyphene, Robitussin dm max day-night, Tape, Trileptal [oxcarbazepine], Bactrim [sulfamethoxazole-trimethoprim], Bupropion, Codeine, Hydrocodone-acetaminophen, Moxifloxacin, Propoxyphene n-acetaminophen, and Vicodin [hydrocodone-acetaminophen]   History: Past Medical History:  Diagnosis Date   Acute cystitis    Allergic rhinitis    Childhood asthma    no problems as adult - no inhaler   Chronic insomnia    Chronic pain due to trauma 02/2001   closed head trauma - Followed by Dr Linton [facility]   Complication of anesthesia    Dyspepsia    Family history of ovarian cancer    Head trauma 02/24/2001   closed   History of kidney stones    passed stone - no surgery required   Hypothyroidism    PONV (postoperative nausea and vomiting)    Pre-diabetes    SVD (spontaneous vaginal delivery)    fetal demise at 5 months   Past Surgical History:  Procedure Laterality Date   APPENDECTOMY     BIOPSY  08/25/2020   Procedure: BIOPSY;  Surgeon: Shellia Cleverly, DO;  Location: [facility];  Service: Gastroenterology;;   COLONOSCOPY WITH PROPOFOL N/A 08/25/2020   Procedure: COLONOSCOPY WITH PROPOFOL;  Surgeon: Shellia Cleverly, DO;  Location: [facility];  Service: Gastroenterology;  Laterality: N/A;   DILATATION & CURETTAGE/HYSTEROSCOPY WITH MYOSURE N/A 04/15/2017   Procedure: DILATATION & CURETTAGE/HYSTEROSCOPY WITH MYOSURE POLYPECTOMY;  Surgeon: Harold Hedge, MD;  Location: [facility];  Service: Gynecology;  Laterality: N/A;   DILATATION & CURETTAGE/HYSTEROSCOPY WITH MYOSURE N/A 06/18/2019   Procedure: DILATATION &  CURETTAGE/HYSTEROSCOPY WITH  MYOSURE;  Surgeon: Harold Hedge, MD;  Location: [facility];  Service: Gynecology;  Laterality: N/A;   ESOPHAGOGASTRODUODENOSCOPY (EGD) WITH PROPOFOL N/A 08/25/2020   Procedure: ESOPHAGOGASTRODUODENOSCOPY (EGD) WITH PROPOFOL;  Surgeon: Shellia Cleverly, DO;  Location: [facility];  Service: Gastroenterology;  Laterality: N/A;   EYE SURGERY     cataracts removed   INCONTINENCE SURGERY     PILONIDAL CYST EXCISION     x2   POLYPECTOMY  08/25/2020   Procedure: POLYPECTOMY;  Surgeon: Shellia Cleverly, DO;  Location: [facility];  Service: Gastroenterology;;   repair of toe laceration     dermabond to right big toe right foot   repair of torn ligament right leg     patient denies this surgery   right foot fracture     cast only   TONSILLECTOMY     WISDOM TOOTH EXTRACTION     Family History  Problem Relation  Age of Onset   Multiple sclerosis Mother 51   Bladder Cancer Mother 74   Heart Problems Father    Dementia Paternal Aunt    Ovarian cancer Maternal Grandmother    Lung cancer Maternal Grandfather    Tuberculosis Paternal Grandmother    Social History   Socioeconomic History   Marital status: Married    Spouse name: Not on file   Number of children: 0   Years of education: Not on file   Highest education level: Not on file  Occupational History   Occupation: disability  Tobacco Use   Smoking status: Never   Smokeless tobacco: Never  Vaping Use   Vaping status: Never Used  Substance and Sexual Activity   Alcohol use: No    Comment: "1 drink/year"   Drug use: Yes    Types: Morphine    Comment: Morphine 15 mg tab 4xdaily prn   Sexual activity: Not Currently    Birth control/protection: None  Other Topics Concern   Not on file  Social History Narrative   [employer], BS, 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   Married- '94- 8 years divorced, Married '03   No children   Retired- disability from closed head injury with focus, cognition   Social  Determinants of Health   Financial Resource Strain: Low Risk  (10/31/2022)   Overall Financial Resource Strain (CARDIA)    Difficulty of Paying Living Expenses: Not hard at all  Food Insecurity: No Food Insecurity (10/31/2022)   Hunger Vital Sign    Worried About Running Out of Food in the Last Year: Never true    Ran Out of Food in the Last Year: Never true  Transportation Needs: No Transportation Needs (10/31/2022)   PRAPARE - Administrator, Civil Service (Medical): No    Lack of Transportation (Non-Medical): No  Physical Activity: Inactive (10/31/2022)   Exercise Vital Sign    Days of Exercise per Week: 0 days    Minutes of Exercise per Session: 0 min  Stress: No Stress Concern Present (10/31/2022)   [employer] of Occupational Health - Occupational Stress Questionnaire    Feeling of Stress : Not at all  Social Connections: Socially Integrated (10/31/2022)   Social Connection and Isolation Panel [NHANES]    Frequency of Communication with Friends and Family: More than three times a week    Frequency of Social Gatherings with Friends and Family: Once a week    Attends Religious Services: More than 4 times per year    Active Member of [employer] or Organizations: Yes    Attends Engineer, structural: More than 4 times per year    Marital Status: Married    Tobacco Counseling Counseling given: Not Answered   Clinical Intake:  Pre-visit preparation completed: Yes  Pain : 0-10 Pain Score: 3  Pain Type: Chronic pain Pain Location: Knee Pain Orientation: Left, Right Pain Descriptors / Indicators: Aching Pain Onset: More than a month ago Pain Frequency: Constant     Nutritional Status: BMI > 30  Obese Nutritional Risks: None Diabetes: Yes CBG done?: No Did pt. bring in CBG monitor from home?: No  How often do you need to have someone help you when you read instructions, pamphlets, or other written materials from your doctor or pharmacy?: 1 -  Never  Interpreter Needed?: No  Information entered by :: NAllen LPN   Activities of Daily Living    10/31/2022    2:40 PM  In your present state  of health, do you have any difficulty performing the following activities:  Hearing? 0  Vision? 0  Difficulty concentrating or making decisions? 1  Comment head injury  Walking or climbing stairs? 1  Comment due to knees  Dressing or bathing? 0  Doing errands, shopping? 0  Preparing Food and eating ? N  Using the Toilet? N  In the past six months, have you accidently leaked urine? Y  Comment wears a pad  Do you have problems with loss of bowel control? N  Managing your Medications? N  Managing your Finances? N  Housekeeping or managing your Housekeeping? N    Patient Care Team: Dorothyann Peng, MD as PCP - General (Internal Medicine) Alden Hipp, RPH-CPP (Pharmacist)  Indicate any recent Medical Services you may have received from other than [facility] providers in the past year (date may be approximate).     Assessment:   This is a routine wellness examination for [name] Wright.  Hearing/Vision screen Hearing Screening - Comments:: Denies hearing issues Vision Screening - Comments:: Regular eye exams, [facility]  Dietary issues and exercise activities discussed:     Goals Addressed             This Visit's Progress    Patient Stated       10/31/2022, standing straight up and sleep on stomach       Depression Screen    10/31/2022    2:53 PM 05/15/2022    3:07 PM 10/18/2021    2:55 PM 10/05/2020   10:23 AM 08/13/2019    9:24 AM 03/10/2019    1:31 PM 08/12/2018    9:22 AM  PHQ 2/9 Scores  PHQ - 2 Score 0 0 0 0 0 0 0  PHQ- 9 Score 6    0  0    Fall Risk    10/31/2022    2:52 PM 05/15/2022    3:07 PM 10/18/2021    2:54 PM 10/05/2020   10:22 AM 08/13/2019    9:24 AM  Fall Risk   Falls in the past year? 0 0 0 1 0  Comment    slid on ice   Number falls in past yr: 0 0 0 0   Injury with Fall? 0 0 0 1    Comment    slight concussion   Risk for fall due to : Medication side effect;Impaired mobility;Impaired balance/gait No Fall Risks Medication side effect Medication side effect Medication side effect  Follow up Falls prevention discussed;Falls evaluation completed Falls evaluation completed Falls evaluation completed;Education provided;Falls prevention discussed Falls evaluation completed;Education provided;Falls prevention discussed Falls evaluation completed;Education provided;Falls prevention discussed    MEDICARE RISK AT HOME:  Medicare Risk at Home - 10/31/22 1453     Any stairs in or around the home? Yes    If so, are there any without handrails? No    Home free of loose throw rugs in walkways, pet beds, electrical cords, etc? Yes    Adequate lighting in your home to reduce risk of falls? Yes    Life alert? No    Use of a cane, walker or w/c? Yes    Grab bars in the bathroom? Yes    Shower chair or bench in shower? Yes    Elevated toilet seat or a handicapped toilet? Yes             TIMED UP AND GO:  Was the test performed?  No    Cognitive Function:  10/31/2022    2:55 PM 10/18/2021    2:57 PM 10/05/2020   10:24 AM 08/13/2019    9:27 AM 08/12/2018    9:24 AM  6CIT Screen  What Year? 0 points 0 points 0 points 0 points 0 points  What month? 0 points 0 points 0 points 0 points 0 points  What time? 0 points 0 points 0 points 0 points 0 points  Count back from 20 0 points 0 points 0 points 2 points 0 points  Months in reverse 0 points 0 points 0 points 0 points 0 points  Repeat phrase 0 points 2 points 4 points 0 points 0 points  Total Score 0 points 2 points 4 points 2 points 0 points    Immunizations Immunization History  Administered Date(s) Administered   Td 09/26/2001   Tdap 02/27/2022    TDAP status: Up to date  Flu Vaccine status: Declined, Education has been provided regarding the importance of this vaccine but patient still declined. Advised may  receive this vaccine at local pharmacy or Health Dept. Aware to provide a copy of the vaccination record if obtained from local pharmacy or Health Dept. Verbalized acceptance and understanding.  Pneumococcal vaccine status: Up to date  Covid-19 vaccine status: Declined, Education has been provided regarding the importance of this vaccine but patient still declined. Advised may receive this vaccine at local pharmacy or Health Dept.or vaccine clinic. Aware to provide a copy of the vaccination record if obtained from local pharmacy or Health Dept. Verbalized acceptance and understanding.  Qualifies for Shingles Vaccine? Yes   Zostavax completed No   Shingrix Completed?: No.    Education has been provided regarding the importance of this vaccine. Patient has been advised to call insurance company to determine out of pocket expense if they have not yet received this vaccine. Advised may also receive vaccine at local pharmacy or Health Dept. Verbalized acceptance and understanding.  Screening Tests Health Maintenance  Topic Date Due   COVID-19 Vaccine (1) Never done   HIV Screening  Never done   Zoster Vaccines- Shingrix (1 of 2) Never done   OPHTHALMOLOGY EXAM  03/21/2022   PAP SMEAR-Modifier  08/12/2022   INFLUENZA VACCINE  11/08/2022   HEMOGLOBIN A1C  12/19/2022   Diabetic kidney evaluation - Urine ACR  02/28/2023   FOOT EXAM  02/28/2023   Diabetic kidney evaluation - eGFR measurement  06/18/2023   MAMMOGRAM  07/12/2023   Medicare Annual Wellness (AWV)  10/31/2023   Colonoscopy  08/26/2030   DTaP/Tdap/Td (3 - Td or Tdap) 02/28/2032   Hepatitis C Screening  Completed   HPV VACCINES  Aged Out    Health Maintenance  Health Maintenance Due  Topic Date Due   COVID-19 Vaccine (1) Never done   HIV Screening  Never done   Zoster Vaccines- Shingrix (1 of 2) Never done   OPHTHALMOLOGY EXAM  03/21/2022   PAP SMEAR-Modifier  08/12/2022    Colorectal cancer screening: Type of screening:  Colonoscopy. Completed 08/25/2020. Repeat every 2 years  Mammogram status: Completed 07/11/2021. Repeat every year  Bone Density status: n/a  Lung Cancer Screening: (Low Dose CT Chest recommended if Age 73-80 years, 20 pack-year currently smoking OR have quit w/in 15years.) does not qualify.   Lung Cancer Screening Referral: no  Additional Screening:  Hepatitis C Screening: does qualify; Completed 10/07/2019  Vision Screening: Recommended annual ophthalmology exams for early detection of glaucoma and other disorders of the eye. Is the patient up to date  with their annual eye exam?  Yes  Who is the provider or what is the name of the office in which the patient attends annual eye exams? [facility] If pt is not established with a provider, would they like to be referred to a provider to establish care? No .   Dental Screening: Recommended annual dental exams for proper oral hygiene  Diabetic Foot Exam: Diabetic Foot Exam: Completed 02/27/2022  Community Resource Referral / Chronic Care Management: CRR required this visit?  No   CCM required this visit?  No     Plan:     I have personally reviewed and noted the following in the patient's chart:   Medical and social history Use of alcohol, tobacco or illicit drugs  Current medications and supplements including opioid prescriptions. Patient is currently taking opioid prescriptions. Information provided to patient regarding non-opioid alternatives. Patient advised to discuss non-opioid treatment plan with their provider. Functional ability and status Nutritional status Physical activity Advanced directives List of other physicians Hospitalizations, surgeries, and ER visits in previous 12 months Vitals Screenings to include cognitive, depression, and falls Referrals and appointments  In addition, I have reviewed and discussed with patient certain preventive protocols, quality metrics, and best practice  recommendations. A written personalized care plan for preventive services as well as general preventive health recommendations were provided to patient.     Barb Merino, LPN   [id number]   After Visit Summary: (MyChart) Due to this being a telephonic visit, the after visit summary with patients personalized plan was offered to patient via MyChart   Nurse Notes: none

## 2022-10-31 NOTE — Patient Instructions (Signed)
[name] Wright , Thank you for taking time to come for your Medicare Wellness Visit. I appreciate your ongoing commitment to your health goals. Please review the following plan we discussed and let me know if I can assist you in the future.   These are the goals we discussed:  Goals       Manage My Medicine      Timeframe:  Long-Range Goal Priority:  High Start Date:                             Expected End Date:                       Follow Up Date: pending follow up with PCP and next steps - tentative 05/01/2022  In progress:   - call for medicine refill 2 or 3 days before it runs out - call if I am sick and can't take my medicine - keep a list of all the medicines I take; vitamins and herbals too - learn to read medicine labels - use an alarm clock or phone to remind me to take my medicine    Why is this important?   These steps will help you keep on track with your medicines.   Notes:  -Please call with any questions you might have -Please bring in financial documentation for patient assistance for Ozempic, and sign all paperwork as identified in your after visit summary      Patient Stated      10/05/2020, wants to weigh 200 pounds      Patient Stated      10/18/2021, wants to lose weight      Patient Stated      10/31/2022, standing straight up and sleep on stomach      Weight (lb) < 200 lb (90.7 kg) (pt-stated)      Weight (lb) < 200 lb (90.7 kg)      08/13/2019, wants to move more and lose weight (wants to get to 200 pounds)        This is a list of the screening recommended for you and due dates:  Health Maintenance  Topic Date Due   COVID-19 Vaccine (1) Never done   HIV Screening  Never done   Zoster (Shingles) Vaccine (1 of 2) Never done   Eye exam for diabetics  03/21/2022   Pap Smear  08/12/2022   Flu Shot  11/08/2022   Hemoglobin A1C  12/19/2022   Yearly kidney health urinalysis for diabetes  02/28/2023   Complete foot exam   02/28/2023   Yearly kidney  function blood test for diabetes  06/18/2023   Mammogram  07/12/2023   Medicare Annual Wellness Visit  10/31/2023   Colon Cancer Screening  08/26/2030   DTaP/Tdap/Td vaccine (3 - Td or Tdap) 02/28/2032   Hepatitis C Screening  Completed   HPV Vaccine  Aged Out    Advanced directives: Advance directive discussed with you today. Even though you declined this today please call our office should you change your mind and we can give you the proper paperwork for you to fill out.  Conditions/risks identified: none  Next appointment: Follow up in one year for your annual wellness visit.    Managing Pain Without Opioids Opioids are strong medicines used to treat moderate to severe pain. For some people, especially those who have long-term (chronic) pain, opioids may not be the best choice  for pain management due to: Side effects like nausea, constipation, and sleepiness. The risk of addiction (opioid use disorder). The longer you take opioids, the greater your risk of addiction. Pain that lasts for more than 3 months is called chronic pain. Managing chronic pain usually requires more than one approach and is often provided by a team of health care providers working together (multidisciplinary approach). Pain management may be done at a pain management center or pain clinic. How to manage pain without the use of opioids Use non-opioid medicines Non-opioid medicines for pain may include: Over-the-counter or prescription non-steroidal anti-inflammatory drugs (NSAIDs). These may be the first medicines used for pain. They work well for muscle and bone pain, and they reduce swelling. Acetaminophen. This over-the-counter medicine may work well for milder pain but not swelling. Antidepressants. These may be used to treat chronic pain. A certain type of antidepressant (tricyclics) is often used. These medicines are given in lower doses for pain than when used for depression. Anticonvulsants. These are  usually used to treat seizures but may also reduce nerve (neuropathic) pain. Muscle relaxants. These relieve pain caused by sudden muscle tightening (spasms). You may also use a pain medicine that is applied to the skin as a patch, cream, or gel (topical analgesic), such as a numbing medicine. These may cause fewer side effects than medicines taken by mouth. Do certain therapies as directed Some therapies can help with pain management. They include: Physical therapy. You will do exercises to gain strength and flexibility. A physical therapist may teach you exercises to move and stretch parts of your body that are weak, stiff, or painful. You can learn these exercises at physical therapy visits and practice them at home. Physical therapy may also involve: Massage. Heat wraps or applying heat or cold to affected areas. Electrical signals that interrupt pain signals (transcutaneous electrical nerve stimulation, TENS). Weak lasers that reduce pain and swelling (low-level laser therapy). Signals from your body that help you learn to regulate pain (biofeedback). Occupational therapy. This helps you to learn ways to function at home and work with less pain. Recreational therapy. This involves trying new activities or hobbies, such as a physical activity or drawing. Mental health therapy, including: Cognitive behavioral therapy (CBT). This helps you learn coping skills for dealing with pain. Acceptance and commitment therapy (ACT) to change the way you think and react to pain. Relaxation therapies, including muscle relaxation exercises and mindfulness-based stress reduction. Pain management counseling. This may be individual, family, or group counseling.  Receive medical treatments Medical treatments for pain management include: Nerve block injections. These may include a pain blocker and anti-inflammatory medicines. You may have injections: Near the spine to relieve chronic back or neck pain. Into  joints to relieve back or joint pain. Into nerve areas that supply a painful area to relieve body pain. Into muscles (trigger point injections) to relieve some painful muscle conditions. A medical device placed near your spine to help block pain signals and relieve nerve pain or chronic back pain (spinal cord stimulation device). Acupuncture. Follow these instructions at home Medicines Take over-the-counter and prescription medicines only as told by your health care provider. If you are taking pain medicine, ask your health care providers about possible side effects to watch out for. Do not drive or use heavy machinery while taking prescription opioid pain medicine. Lifestyle  Do not use drugs or alcohol to reduce pain. If you drink alcohol, limit how much you have to: 0-1 drink a day for  women who are not pregnant. 0-2 drinks a day for men. Know how much alcohol is in a drink. In the [place] one drink equals one 12 oz bottle of beer (355 mL), one 5 oz glass of wine (148 mL), or one 1 oz glass of hard liquor (44 mL). Do not use any products that contain nicotine or tobacco. These products include cigarettes, chewing tobacco, and vaping devices, such as e-cigarettes. If you need help quitting, ask your health care provider. Eat a healthy diet and maintain a healthy weight. Poor diet and excess weight may make pain worse. Eat foods that are high in fiber. These include fresh fruits and vegetables, whole grains, and beans. Limit foods that are high in fat and processed sugars, such as fried and sweet foods. Exercise regularly. Exercise lowers stress and may help relieve pain. Ask your health care provider what activities and exercises are safe for you. If your health care provider approves, join an exercise class that combines movement and stress reduction. Examples include yoga and tai chi. Get enough sleep. Lack of sleep may make pain worse. Lower stress as much as possible. Practice stress  reduction techniques as told by your therapist. General instructions Work with all your pain management providers to find the treatments that work best for you. You are an important member of your pain management team. There are many things you can do to reduce pain on your own. Consider joining an online or in-person support group for people who have chronic pain. Keep all follow-up visits. This is important. Where to find more information You can find more information about managing pain without opioids from: American Academy of Pain Medicine: painmed.org Institute for Chronic Pain: instituteforchronicpain.org American Chronic Pain Association: theacpa.org Contact a health care provider if: You have side effects from pain medicine. Your pain gets worse or does not get better with treatments or home therapy. You are struggling with anxiety or depression. Summary Many types of pain can be managed without opioids. Chronic pain may respond better to pain management without opioids. Pain is best managed when you and a team of health care providers work together. Pain management without opioids may include non-opioid medicines, medical treatments, physical therapy, mental health therapy, and lifestyle changes. Tell your health care providers if your pain gets worse or is not being managed well enough. This information is not intended to replace advice given to you by your health care provider. Make sure you discuss any questions you have with your health care provider. Document Revised: 07/06/2020 Document Reviewed: 07/06/2020 Elsevier Patient Education  2024 [employer].   Preventive Care 40-64 Years, Female Preventive care refers to lifestyle choices and visits with your health care provider that can promote health and wellness. What does preventive care include? A yearly physical exam. This is also called an annual well check. Dental exams once or twice a year. Routine eye exams. Ask  your health care provider how often you should have your eyes checked. Personal lifestyle choices, including: Daily care of your teeth and gums. Regular physical activity. Eating a healthy diet. Avoiding tobacco and drug use. Limiting alcohol use. Practicing safe sex. Taking low-dose aspirin daily starting at age 14. Taking vitamin and mineral supplements as recommended by your health care provider. What happens during an annual well check? The services and screenings done by your health care provider during your annual well check will depend on your age, overall health, lifestyle risk factors, and family history of disease. Counseling  Your health care provider may ask you questions about your: Alcohol use. Tobacco use. Drug use. Emotional well-being. Home and relationship well-being. Sexual activity. Eating habits. Work and work Astron[employer]. Method of birth control. Menstrual cycle. Pregnancy history. Screening  You may have the following tests or measurements: Height, weight, and BMI. Blood pressure. Lipid and cholesterol levels. These may be checked every 5 years, or more frequently if you are over 29 years old. Skin check. Lung cancer screening. You may have this screening every year starting at age 78 if you have a 30-pack-year history of smoking and currently smoke or have quit within the past 15 years. Fecal occult blood test (FOBT) of the stool. You may have this test every year starting at age 29. Flexible sigmoidoscopy or colonoscopy. You may have a sigmoidoscopy every 5 years or a colonoscopy every 10 years starting at age 26. Hepatitis C blood test. Hepatitis B blood test. Sexually transmitted disease (STD) testing. Diabetes screening. This is done by checking your blood sugar (glucose) after you have not eaten for a while (fasting). You may have this done every 1-3 years. Mammogram. This may be done every 1-2 years. Talk to your health care provider about when you  should start having regular mammograms. This may depend on whether you have a family history of breast cancer. BRCA-related cancer screening. This may be done if you have a family history of breast, ovarian, tubal, or peritoneal cancers. Pelvic exam and Pap test. This may be done every 3 years starting at age 73. Starting at age 20, this may be done every 5 years if you have a Pap test in combination with an HPV test. Bone density scan. This is done to screen for osteoporosis. You may have this scan if you are at high risk for osteoporosis. Discuss your test results, treatment options, and if necessary, the need for more tests with your health care provider. Vaccines  Your health care provider may recommend certain vaccines, such as: Influenza vaccine. This is recommended every year. Tetanus, diphtheria, and acellular pertussis (Tdap, Td) vaccine. You may need a Td booster every 10 years. Zoster vaccine. You may need this after age 56. Pneumococcal 13-valent conjugate (PCV13) vaccine. You may need this if you have certain conditions and were not previously vaccinated. Pneumococcal polysaccharide (PPSV23) vaccine. You may need one or two doses if you smoke cigarettes or if you have certain conditions. Talk to your health care provider about which screenings and vaccines you need and how often you need them. This information is not intended to replace advice given to you by your health care provider. Make sure you discuss any questions you have with your health care provider. Document Released: 04/22/2015 Document Revised: 12/14/2015 Document Reviewed: 01/25/2015 Elsevier Interactive Patient Education  2017 [employer].    Fall Prevention in the Home Falls can cause injuries. They can happen to people of all ages. There are many things you can do to make your home safe and to help prevent falls. What can I do on the outside of my home? Regularly fix the edges of walkways and driveways and fix  any cracks. Remove anything that might make you trip as you walk through a door, such as a raised step or threshold. Trim any bushes or trees on the path to your home. Use bright outdoor lighting. Clear any walking paths of anything that might make someone trip, such as rocks or tools. Regularly check to see if handrails are loose or broken.  Make sure that both sides of any steps have handrails. Any raised decks and porches should have guardrails on the edges. Have any leaves, snow, or ice cleared regularly. Use sand or salt on walking paths during winter. Clean up any spills in your garage right away. This includes oil or grease spills. What can I do in the bathroom? Use night lights. Install grab bars by the toilet and in the tub and shower. Do not use towel bars as grab bars. Use non-skid mats or decals in the tub or shower. If you need to sit down in the shower, use a plastic, non-slip stool. Keep the floor dry. Clean up any water that spills on the floor as soon as it happens. Remove soap buildup in the tub or shower regularly. Attach bath mats securely with double-sided non-slip rug tape. Do not have throw rugs and other things on the floor that can make you trip. What can I do in the bedroom? Use night lights. Make sure that you have a light by your bed that is easy to reach. Do not use any sheets or blankets that are too big for your bed. They should not hang down onto the floor. Have a firm chair that has side arms. You can use this for support while you get dressed. Do not have throw rugs and other things on the floor that can make you trip. What can I do in the kitchen? Clean up any spills right away. Avoid walking on wet floors. Keep items that you use a lot in easy-to-reach places. If you need to reach something above you, use a strong step stool that has a grab bar. Keep electrical cords out of the way. Do not use floor polish or wax that makes floors slippery. If you  must use wax, use non-skid floor wax. Do not have throw rugs and other things on the floor that can make you trip. What can I do with my stairs? Do not leave any items on the stairs. Make sure that there are handrails on both sides of the stairs and use them. Fix handrails that are broken or loose. Make sure that handrails are as long as the stairways. Check any carpeting to make sure that it is firmly attached to the stairs. Fix any carpet that is loose or worn. Avoid having throw rugs at the top or bottom of the stairs. If you do have throw rugs, attach them to the floor with carpet tape. Make sure that you have a light switch at the top of the stairs and the bottom of the stairs. If you do not have them, ask someone to add them for you. What else can I do to help prevent falls? Wear shoes that: Do not have high heels. Have rubber bottoms. Are comfortable and fit you well. Are closed at the toe. Do not wear sandals. If you use a stepladder: Make sure that it is fully opened. Do not climb a closed stepladder. Make sure that both sides of the stepladder are locked into place. Ask someone to hold it for you, if possible. Clearly mark and make sure that you can see: Any grab bars or handrails. First and last steps. Where the edge of each step is. Use tools that help you move around (mobility aids) if they are needed. These include: Canes. Walkers. Scooters. Crutches. Turn on the lights when you go into a dark area. Replace any light bulbs as soon as they burn out. Set up your  furniture so you have a clear path. Avoid moving your furniture around. If any of your floors are uneven, fix them. If there are any pets around you, be aware of where they are. Review your medicines with your doctor. Some medicines can make you feel dizzy. This can increase your chance of falling. Ask your doctor what other things that you can do to help prevent falls. This information is not intended to replace  advice given to you by your health care provider. Make sure you discuss any questions you have with your health care provider. Document Released: 01/20/2009 Document Revised: 09/01/2015 Document Reviewed: 04/30/2014 Elsevier Interactive Patient Education  2017 [employer].

## 2022-11-02 DIAGNOSIS — M1711 Unilateral primary osteoarthritis, right knee: Secondary | ICD-10-CM | POA: Diagnosis not present

## 2022-11-05 ENCOUNTER — Other Ambulatory Visit (HOSPITAL_COMMUNITY): Payer: Self-pay

## 2022-11-05 ENCOUNTER — Encounter: Payer: Self-pay | Admitting: Internal Medicine

## 2022-11-05 ENCOUNTER — Telehealth: Payer: Self-pay

## 2022-11-05 NOTE — Telephone Encounter (Signed)
Submitted application for OZEMPIC to [employer] for patient assistance via online portal.   Phone: [phone]  

## 2022-11-06 ENCOUNTER — Other Ambulatory Visit: Payer: Self-pay | Admitting: Internal Medicine

## 2022-11-06 DIAGNOSIS — G4733 Obstructive sleep apnea (adult) (pediatric): Secondary | ICD-10-CM | POA: Diagnosis not present

## 2022-11-06 DIAGNOSIS — M7918 Myalgia, other site: Secondary | ICD-10-CM | POA: Diagnosis not present

## 2022-11-06 DIAGNOSIS — R519 Headache, unspecified: Secondary | ICD-10-CM | POA: Diagnosis not present

## 2022-11-06 DIAGNOSIS — G894 Chronic pain syndrome: Secondary | ICD-10-CM | POA: Diagnosis not present

## 2022-11-06 DIAGNOSIS — R202 Paresthesia of skin: Secondary | ICD-10-CM | POA: Diagnosis not present

## 2022-11-06 DIAGNOSIS — M25561 Pain in right knee: Secondary | ICD-10-CM | POA: Diagnosis not present

## 2022-11-06 DIAGNOSIS — M25562 Pain in left knee: Secondary | ICD-10-CM | POA: Diagnosis not present

## 2022-11-06 DIAGNOSIS — M542 Cervicalgia: Secondary | ICD-10-CM | POA: Diagnosis not present

## 2022-11-06 MED ORDER — FREESTYLE LIBRE 3 SENSOR MISC: 1.0000 | 2 refills | Status: DC

## 2022-11-14 NOTE — Telephone Encounter (Signed)
Received notification from [employer] regarding approval for OZEMPIC. Patient assistance approved from 11/08/22 to 04/09/23.  Medication will ship to [facility] office in 10-14 business days.  Phone: [phone]

## 2022-11-15 ENCOUNTER — Ambulatory Visit: Payer: Medicare Other | Admitting: Internal Medicine

## 2022-11-23 ENCOUNTER — Other Ambulatory Visit: Payer: Self-pay

## 2022-11-23 MED ORDER — FREESTYLE LIBRE 3 SENSOR MISC: 1.0000 | 2 refills | Status: DC

## 2022-11-27 DIAGNOSIS — E1165 Type 2 diabetes mellitus with hyperglycemia: Secondary | ICD-10-CM | POA: Diagnosis not present

## 2022-11-30 ENCOUNTER — Telehealth: Payer: Self-pay | Admitting: Internal Medicine

## 2022-11-30 NOTE — Telephone Encounter (Signed)
Patient called with a change in appointment scheduled for 9/16 due to Dr. Allyne Gee being out of the office. Pt confirmed and aware of new time and date.

## 2022-12-03 DIAGNOSIS — M1712 Unilateral primary osteoarthritis, left knee: Secondary | ICD-10-CM | POA: Diagnosis not present

## 2022-12-06 ENCOUNTER — Telehealth: Payer: Self-pay | Admitting: Internal Medicine

## 2022-12-06 NOTE — Telephone Encounter (Signed)
OZEMPIC [id number] QUANTITY 4

## 2022-12-12 DIAGNOSIS — M1712 Unilateral primary osteoarthritis, left knee: Secondary | ICD-10-CM | POA: Diagnosis not present

## 2022-12-19 ENCOUNTER — Encounter: Payer: Self-pay | Admitting: Internal Medicine

## 2022-12-19 DIAGNOSIS — M1712 Unilateral primary osteoarthritis, left knee: Secondary | ICD-10-CM | POA: Diagnosis not present

## 2022-12-24 ENCOUNTER — Ambulatory Visit: Payer: Medicare Other | Admitting: Internal Medicine

## 2022-12-24 IMAGING — CT CT ABD-PELV W/ CM
2 of 5 series · 16 of 46 positions shown, 18 images · IV contrast (omnipaque)
Comparison: 10/28/2014

CLINICAL DATA: Right upper quadrant pain and nausea with vomiting
for past 5 weeks.

EXAM:
CT ABDOMEN AND PELVIS WITH CONTRAST
TECHNIQUE: Multidetector CT imaging of the abdomen and pelvis was performed
using the standard protocol following bolus administration of
intravenous contrast.
CONTRAST:  100mL OMNIPAQUE IOHEXOL 300 MG/ML  SOLN

[Series 2: abd/pel w · axial · 0.84mm/px · z∈[-558,-118]mm · 13 of 100 slices shown, 15 images]
[im 6/100  soft-tissue]
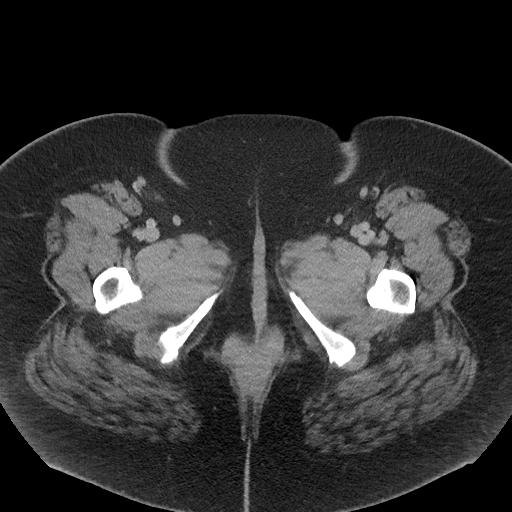
[im 6/100  bone]
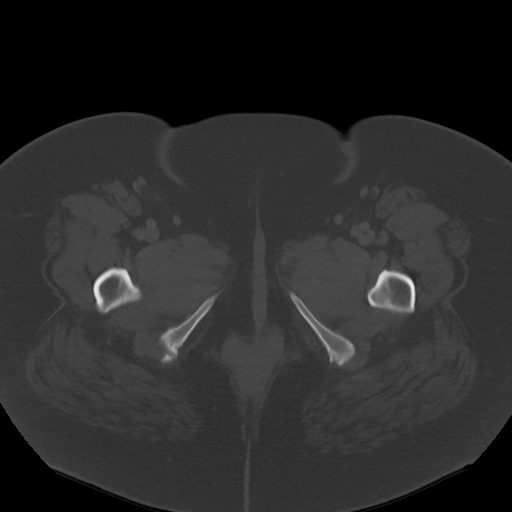
[im 12/100  soft-tissue]
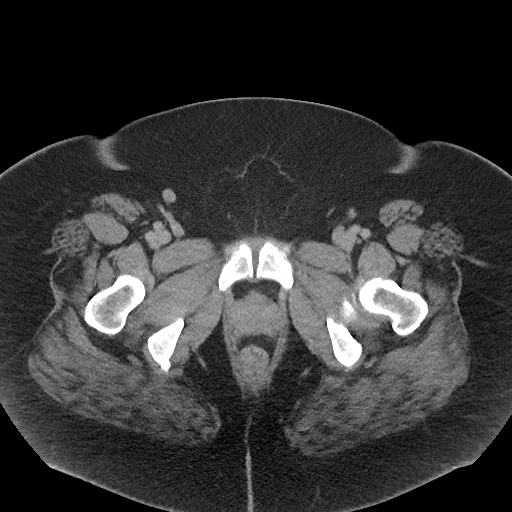
[im 23/100  soft-tissue]
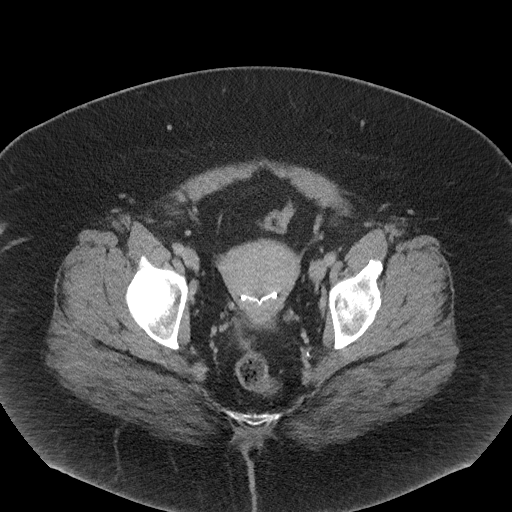
[im 28/100  soft-tissue]
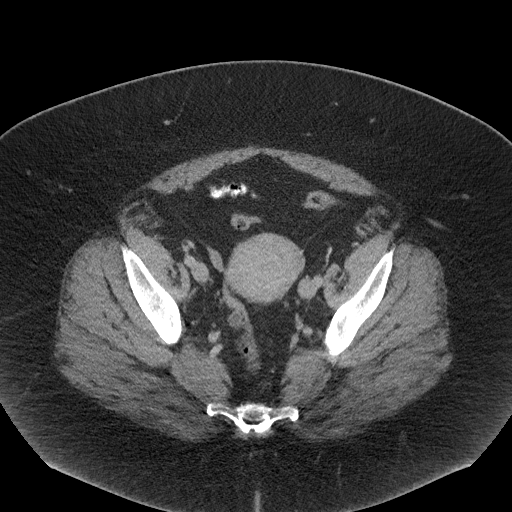
[im 34/100  soft-tissue]
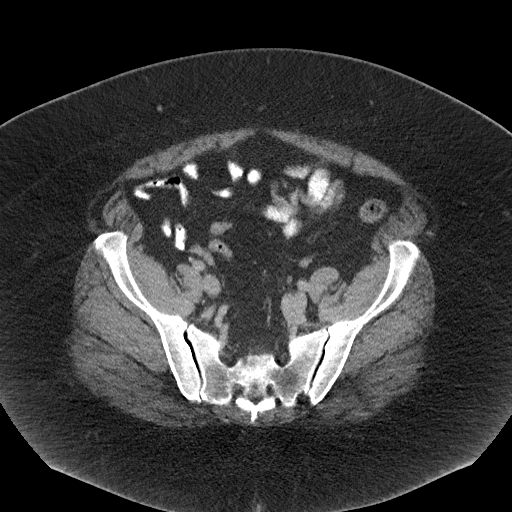
[im 45/100  soft-tissue]
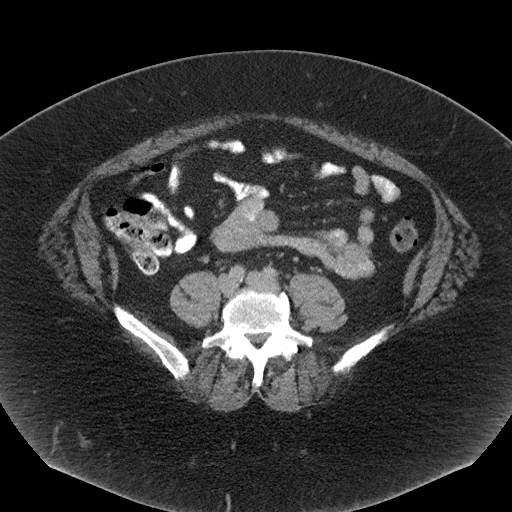
[im 50/100  soft-tissue]
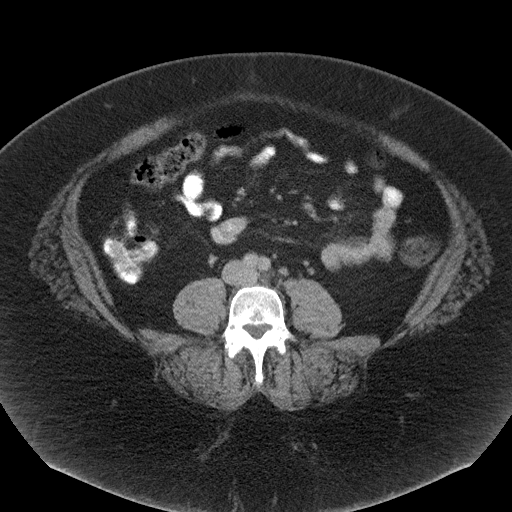
[im 56/100  soft-tissue]
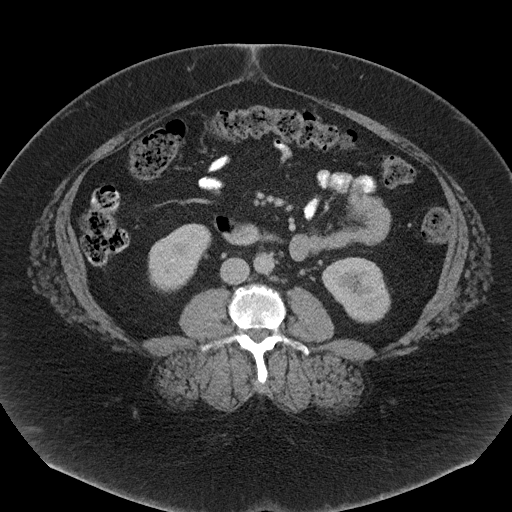
[im 67/100  soft-tissue]
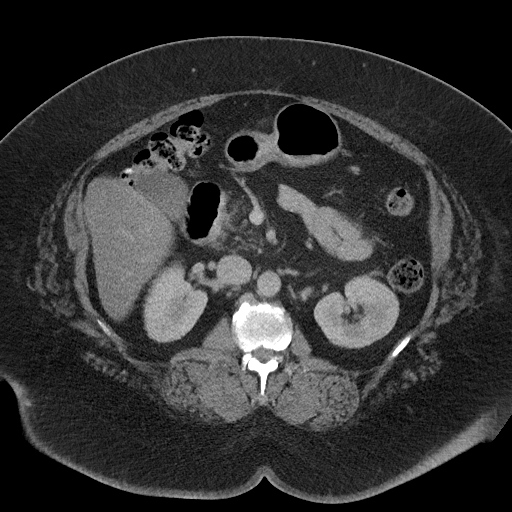
[im 67/100  bone]
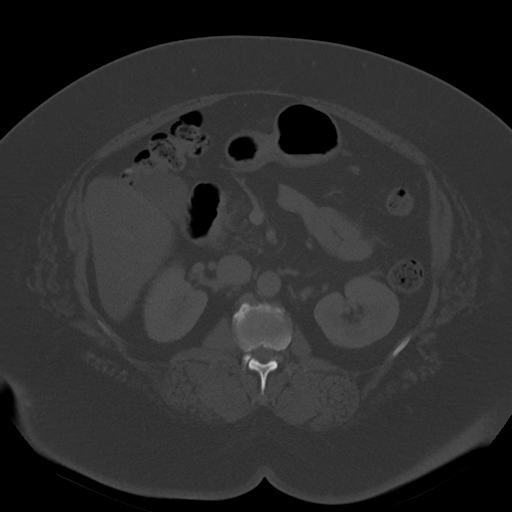
[im 72/100  soft-tissue]
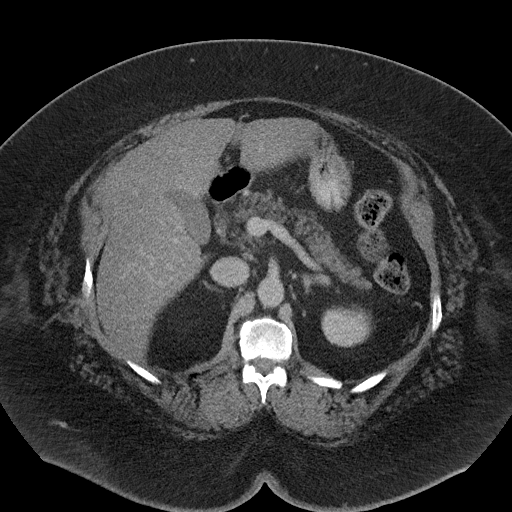
[im 78/100  soft-tissue]
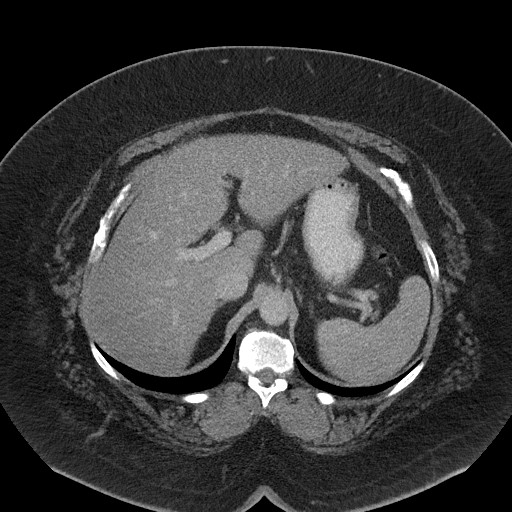
[im 89/100  soft-tissue]
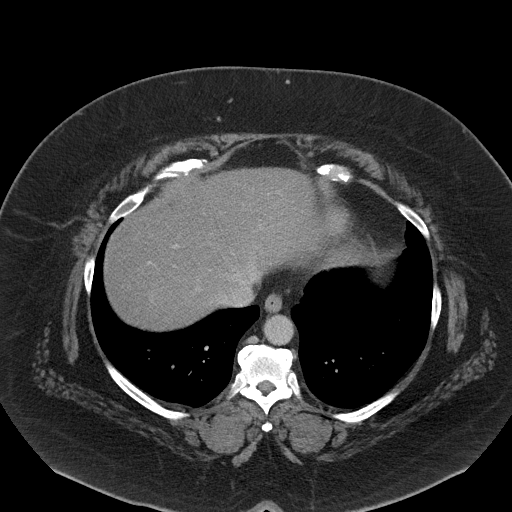
[im 94/100  soft-tissue]
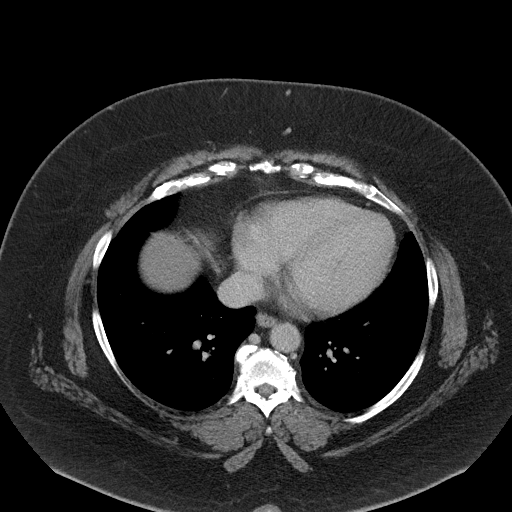

[Series 6: coronal st · coronal · 0.80mm/px · 3 of 93 slices shown]
[im 31/93  soft-tissue]
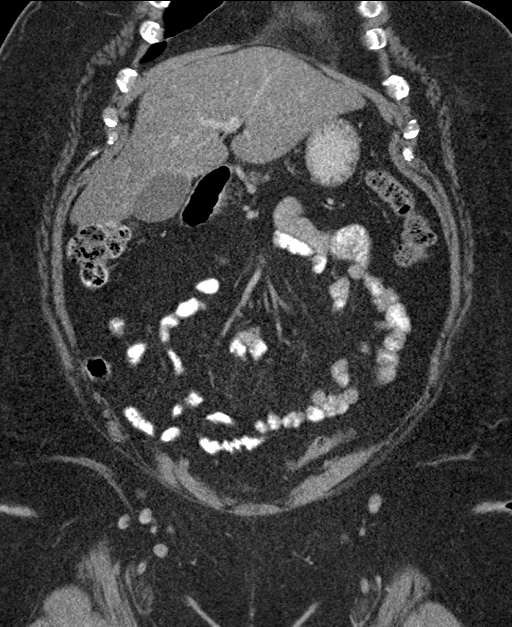
[im 41/93  soft-tissue]
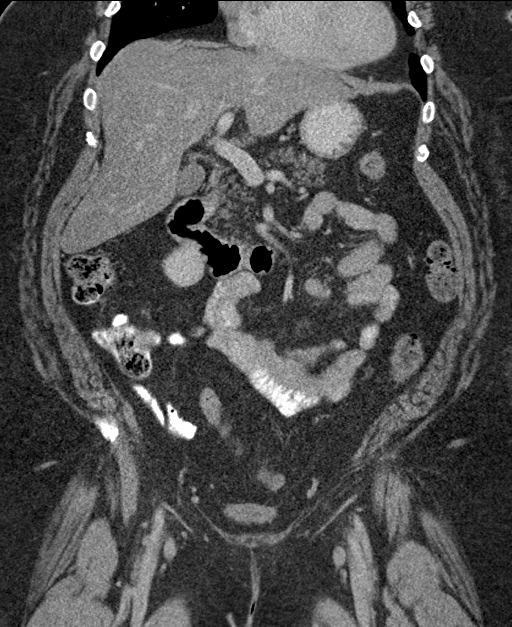
[im 52/93  soft-tissue]
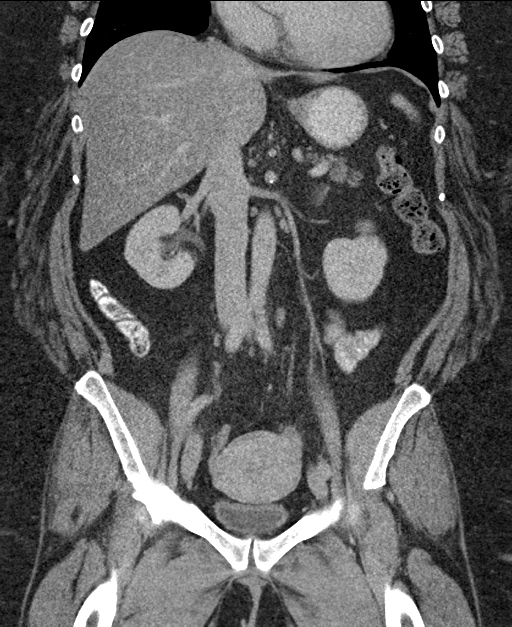

[16 of 46 positions shown; findings below may reference images not displayed]

FINDINGS: Lower Chest: No acute findings.

Hepatobiliary: No hepatic masses identified. Mild-to-moderate
diffuse hepatic steatosis again noted. Gallbladder is unremarkable.
No evidence of biliary ductal dilatation.

Pancreas:  No mass or inflammatory changes.

Spleen: Within normal limits in size and appearance.

Adrenals/Urinary Tract: No masses identified. No evidence of
ureteral calculi or hydronephrosis.

Stomach/Bowel: No evidence of obstruction, inflammatory process or
abnormal fluid collections.

Vascular/Lymphatic: No pathologically enlarged lymph nodes. No acute
vascular findings.

Reproductive: A new IUD is seen which is in abnormal position in the
cervix and lower uterine segment, with the side arms penetrating
into the myometrial walls. No evidence of mass, inflammatory
process, or abnormal fluid collections.

Other:  None.

Musculoskeletal:  No suspicious bone lesions identified.
IMPRESSION: IUD in abnormal position in the cervix and lower uterine segment,
with side arms penetrating into the myometrial walls.

Stable hepatic steatosis.

## 2022-12-26 ENCOUNTER — Ambulatory Visit: Payer: Medicare Other | Admitting: Internal Medicine

## 2023-01-09 ENCOUNTER — Encounter: Payer: Self-pay | Admitting: Pharmacist

## 2023-01-17 ENCOUNTER — Other Ambulatory Visit: Payer: Self-pay

## 2023-01-17 DIAGNOSIS — E1165 Type 2 diabetes mellitus with hyperglycemia: Secondary | ICD-10-CM

## 2023-01-23 ENCOUNTER — Other Ambulatory Visit (INDEPENDENT_AMBULATORY_CARE_PROVIDER_SITE_OTHER): Payer: Medicare Other | Admitting: Pharmacist

## 2023-01-23 DIAGNOSIS — E1165 Type 2 diabetes mellitus with hyperglycemia: Secondary | ICD-10-CM

## 2023-01-23 MED ORDER — SEMAGLUTIDE (2 MG/DOSE) 8 MG/3ML ~~LOC~~ SOPN: 2.0000 mg | PEN_INJECTOR | SUBCUTANEOUS | Status: DC

## 2023-01-23 NOTE — Addendum Note (Signed)
Addended by: Nilda Simmer T on: 01/23/2023 10:41 AM   Modules accepted: Orders

## 2023-01-23 NOTE — Progress Notes (Signed)
01/23/2023 Name: [name] Wright MRN: [id number] DOB: [birth year]  Chief Complaint  Patient presents with   Medication Management   Hypertension   Diabetes    [name] Wright is a 57 y.o. year old female who presented for a telephone visit.   They were referred to the pharmacist by their PCP for assistance in managing diabetes, hypertension, and hyperlipidemia.   Subjective:  Care Team: Primary Care Provider: Dorothyann Peng, MD ; Next Scheduled Visit: 03/13/2023  Medication Access/Adherence  Current Pharmacy:  [facility] [address] [place] - [address] [place] [address] Phone: [phone] Fax: [phone]   Patient reports affordability concerns with their medications: No  Patient reports access/transportation concerns to their pharmacy: No  Patient reports adherence concerns with their medications:  No    Reports she has been more tired/sluggish lately, requests a B12 check. Also wonders if she needs to continue weekly Vitamin D  Diabetes:  Current medications: Ozempic 1 mg weekly Medications tried in the past: hx increased yeast infections with Jardiance  Reports readings remain elevated- average glucose ~ 160.   Patient denies hypoglycemic s/sx including dizziness, shakiness, sweating.   Approved for Ozempic assistance through [employer].   Hyperlipidemia/ASCVD Risk Reduction  Current lipid lowering medications: none Medications tried in the past: none  Antiplatelet regimen: asks if she needs aspirin    Clinical ASCVD: No  The 10-year ASCVD risk score (Arnett DK, et al., 2019) is: 7.3%   Values used to calculate the score:     Age: 75 years     Sex: Female     Is Non-Hispanic African American: No     Diabetic: Yes     Tobacco smoker: No     Systolic Blood Pressure: 149 mmHg     Is BP treated: Yes     HDL Cholesterol: 51 mg/dL     Total Cholesterol: 179 mg/dL    PREVENT Risk Score: 10 year  risk of CVD: 7.4%   Objective:  Lab Results  Component Value Date   HGBA1C 7.1 (H) 06/18/2022    Lab Results  Component Value Date   CREATININE 0.72 06/18/2022   BUN 11 06/18/2022   NA 137 06/18/2022   K 4.4 06/18/2022   CL 103 06/18/2022   CO2 22 06/18/2022    Lab Results  Component Value Date   CHOL 179 10/18/2021   HDL 51 10/18/2021   LDLCALC 106 (H) 10/18/2021   TRIG 121 10/18/2021   CHOLHDL 3.5 10/18/2021    Medications Reviewed Today     Reviewed by Alden Hipp, RPH-CPP (Pharmacist) on 01/23/23 at [phone]  Med List Status: <None>   Medication Order Taking? Sig Documenting Provider Last Dose Status Informant  albuterol (PROAIR HFA) 108 (90 Base) MCG/ACT inhaler [id number] Yes Inhale 2 puffs into the lungs every 4 (four) hours as needed for wheezing or shortness of breath. Dorothyann Peng, MD Taking Active   baclofen (LIORESAL) 10 MG tablet [id number] Yes Take by mouth. [provider] Taking Active   butalbital-acetaminophen-caffeine (FIORICET) 50-325-40 MG tablet [id number] Yes Take 1 tablet by mouth every 4 (four) hours as needed. [provider] Taking Active   cetirizine (ZYRTEC ALLERGY) 10 MG tablet [id number] No Take 1 tablet (10 mg total) by mouth daily.  Patient not taking: Reported on 01/23/2023   Junie Spencer, FNP Not Taking Active   Continuous Glucose Sensor (FREESTYLE LIBRE 3 SENSOR) [place] [id number] Yes 1 applicator by  Does not apply route as directed. Place 1 sensor on the skin every 14 days. Use to check glucose continuously Dorothyann Peng, MD Taking Active   diphenhydrAMINE (BENADRYL) 25 mg capsule [id number] Yes Take 25 mg by mouth every 6 (six) hours as needed for allergies.  [provider] Taking Active Self  EPINEPHrine 0.3 mg/0.3 mL IJ SOAJ injection [id number] No Inject 0.3 mg into the muscle See admin instructions.  Patient not taking: Reported on 01/23/2023   [provider] Not Taking Active   fluticasone  (FLONASE) 50 MCG/ACT nasal spray [id number] Yes Place 1 spray into both nostrils in the morning. [provider] Taking Active Self  furosemide (LASIX) 80 MG tablet [id number] Yes TAKE 1 TABLET BY MOUTH ONCE DAILY AS NEEDED FOR FLUID Dorothyann Peng, MD Taking Active   levothyroxine (SYNTHROID) 100 MCG tablet [id number] Yes TAKE 1 TABLET BY MOUTH BEFORE Kathe Mariner, MD Taking Active   Magnesium 400 MG TABS [id number] Yes Take 1 tablet by mouth daily. [provider] Taking Active Self  meclizine (ANTIVERT) 25 MG tablet [id number] Yes Take 12.5-25 mg by mouth 2 (two) times daily as needed for dizziness (vertigo). [provider] Taking Active Self  methocarbamol (ROBAXIN) 750 MG tablet [id number] Yes Take 500 mg by mouth every 6 (six) hours as needed for muscle spasms. 1 at bedtime [provider] Taking Active Self  morphine (MSIR) 15 MG tablet [id number] Yes Take 15 mg by mouth 4 (four) times daily as needed for severe pain.  [provider] Taking Active Self  Multiple Vitamins-Minerals (MAXIMUM DAILY GREEN PO) [id number]  Take 1 Package by mouth 3 (three) times a week. It Works! Greens [provider]  Active Self  promethazine (PHENERGAN) 25 MG tablet [id number] Yes TAKE 1/2 TO 1 (ONE-HALF TO ONE) TABLET BY MOUTH EVERY 8 HOURS AS NEEDED FOR NAUSEA Esterwood, Amy S, PA-C Taking Active   RESTASIS 0.05 % ophthalmic emulsion [id number] Yes Place 1 drop into both eyes 2 (two) times daily. [provider] Taking Active Self  Semaglutide, 1 MG/DOSE, 4 MG/3ML SOPN [id number] Yes Inject 1 mg into the skin once a week. Dorothyann Peng, MD Taking Active   SUDOGEST MAXIMUM STRENGTH 30 MG tablet [id number]  TAKE 1 TABLET BY MOUTH TWICE DAILY AS NEEDED FOR CONGESTION Arnette Felts, FNP  Active   traMADol (ULTRAM) 50 MG tablet [id number] Yes TAKE 1 TABLET BY MOUTH EVERY 6 HOURS AS NEEDED Esterwood, Amy S, PA-C Taking Active   Vitamin D, Ergocalciferol,  (DRISDOL) 1.25 MG (50000 UNIT) CAPS capsule [id number] Yes Take 1 capsule (50,000 Units total) by mouth every 7 (seven) days. Dorothyann Peng, MD Taking Active               Assessment/Plan:   Diabetes: - Currently uncontrolled - Reviewed long term cardiovascular and renal outcomes of uncontrolled blood sugar - Reviewed goal A1c, goal fasting, and goal 2 hour post prandial glucose - Recommend to increase Ozempic to 2 mg weekly with reapplication. Completed patient and provider re-enrollment portions. Will await notification of approval.  - Recommend to check glucose using Libre.    Hyperlipidemia/ASCVD Risk Reduction: - Currently uncontrolled.  - Reviewed long term complications of uncontrolled cholesterol. Discussed goal of moderate intensity statin and LDL <70 for patients with diabetes - Recommend to start rosuvastatin 10 mg daily. Counseled on mechanism of action, side effects. Patient is amenable to start. Follow up lipids with next PCP visit.   Follow Up Plan: PCP visit as scheduled.  Will discuss Vit D and B12.  Catie Eppie Gibson, PharmD, BCACP, CPP Clinical Pharmacist [facility] [phone]

## 2023-01-23 NOTE — Patient Instructions (Signed)
Hi Beth,   It was great talking to you today!  We will apply for the Ozempic 2 mg weekly dose.   Start rosuvastatin 10 mg daily. Most people do not feel any different when they start this medication, but we see significant improvement in LDL. Our goal is for your LDL to be less than 70 to reduce your risk of heart disease.   Please reach out if you do have any side effects with this medication.   Catie Clearance Coots, PharmD

## 2023-01-30 ENCOUNTER — Ambulatory Visit: Payer: Medicare Other | Admitting: Family Medicine

## 2023-01-30 ENCOUNTER — Encounter: Payer: Self-pay | Admitting: Family Medicine

## 2023-01-30 VITALS — BP 142/90 | HR 95 | Temp 98.4°F

## 2023-01-30 DIAGNOSIS — E7800 Pure hypercholesterolemia, unspecified: Secondary | ICD-10-CM

## 2023-01-30 DIAGNOSIS — E781 Pure hyperglyceridemia: Secondary | ICD-10-CM

## 2023-01-30 DIAGNOSIS — R252 Cramp and spasm: Secondary | ICD-10-CM | POA: Diagnosis not present

## 2023-01-30 DIAGNOSIS — Z2821 Immunization not carried out because of patient refusal: Secondary | ICD-10-CM | POA: Diagnosis not present

## 2023-01-30 DIAGNOSIS — E559 Vitamin D deficiency, unspecified: Secondary | ICD-10-CM

## 2023-01-30 DIAGNOSIS — I10 Essential (primary) hypertension: Secondary | ICD-10-CM

## 2023-01-30 DIAGNOSIS — E538 Deficiency of other specified B group vitamins: Secondary | ICD-10-CM

## 2023-01-30 DIAGNOSIS — B3731 Acute candidiasis of vulva and vagina: Secondary | ICD-10-CM

## 2023-01-30 DIAGNOSIS — E1165 Type 2 diabetes mellitus with hyperglycemia: Secondary | ICD-10-CM | POA: Diagnosis not present

## 2023-01-30 DIAGNOSIS — E66813 Obesity, class 3: Secondary | ICD-10-CM

## 2023-01-30 DIAGNOSIS — Z6841 Body Mass Index (BMI) 40.0 and over, adult: Secondary | ICD-10-CM

## 2023-01-30 LAB — POCT URINALYSIS DIPSTICK
Blood, UA: NEGATIVE
Glucose, UA: NEGATIVE
Leukocytes, UA: NEGATIVE
Nitrite, UA: NEGATIVE
Protein, UA: POSITIVE — AB
Spec Grav, UA: >=1.03 — AB (ref 1.010–1.025)
Urobilinogen, UA: 0.2 U/dL
pH, UA: 5.5 (ref 5.0–8.0)

## 2023-01-30 MED ORDER — FLUCONAZOLE 150 MG PO TABS: 150.0000 mg | ORAL_TABLET | ORAL | 0 refills | Status: DC

## 2023-01-30 MED ORDER — CYANOCOBALAMIN 1000 MCG/ML IJ SOLN
1000.0000 ug | Freq: Once | INTRAMUSCULAR | Status: AC
  Administered 2023-01-30: 1000 ug via INTRAMUSCULAR

## 2023-01-30 NOTE — Progress Notes (Signed)
I,Jameka J Llittleton, CMA,acting as a Neurosurgeon for [employer], NP.,have documented all relevant documentation on the behalf of [name], NP,as directed by  [name], NP while in the presence of [name], NP.  Subjective:  Patient ID: [name] Wright , female    DOB: 1965-06-06 , 57 y.o.   MRN: [id number]  Chief Complaint  Patient presents with   Diabetes   Hypertension    HPI  Patient presents today for chronic disease management. She reports she notices a large lump in the middle of her stomach when she lays down. Patient feels she may have a yeast infection in her groin area and lip area as well due to her sitting down a lot as a result of her mobility. She would like some medicine on hand for future times. She has not seen her GYN or had an eye exam this year.      Past Medical History:  Diagnosis Date   Acute cystitis    Allergic rhinitis    Childhood asthma    no problems as adult - no inhaler   Chronic insomnia    Chronic pain due to trauma 02/2001   closed head trauma - Followed by Dr Linton [facility]   Complication of anesthesia    Dyspepsia    Family history of ovarian cancer    Head trauma 02/24/2001   closed   History of kidney stones    passed stone - no surgery required   Hypothyroidism    PONV (postoperative nausea and vomiting)    Pre-diabetes    SVD (spontaneous vaginal delivery)    fetal demise at 5 months     Family History  Problem Relation Age of Onset   Multiple sclerosis Mother 72   Bladder Cancer Mother 11   Heart Problems Father    Dementia Paternal Aunt    Ovarian cancer Maternal Grandmother    Lung cancer Maternal Grandfather    Tuberculosis Paternal Grandmother      Current Outpatient Medications:    albuterol (PROAIR HFA) 108 (90 Base) MCG/ACT inhaler, Inhale 2 puffs into the lungs every 4 (four) hours as needed for wheezing or shortness of breath., Disp: 1 each, Rfl: 1   baclofen (LIORESAL) 10 MG  tablet, Take by mouth., Disp: , Rfl:    butalbital-acetaminophen-caffeine (FIORICET) 50-325-40 MG tablet, Take 1 tablet by mouth every 4 (four) hours as needed., Disp: , Rfl:    Continuous Glucose Sensor (FREESTYLE LIBRE 3 SENSOR) MISC, 1 applicator by Does not apply route as directed. Place 1 sensor on the skin every 14 days. Use to check glucose continuously, Disp: 3 each, Rfl: 2   diphenhydrAMINE (BENADRYL) 25 mg capsule, Take 25 mg by mouth every 6 (six) hours as needed for allergies. , Disp: , Rfl:    fluconazole (DIFLUCAN) 150 MG tablet, Take 1 tablet (150 mg total) by mouth every other day., Disp: 2 tablet, Rfl: 0   fluticasone (FLONASE) 50 MCG/ACT nasal spray, Place 1 spray into both nostrils in the morning., Disp: , Rfl:    levothyroxine (SYNTHROID) 100 MCG tablet, TAKE 1 TABLET BY MOUTH BEFORE BREAKFAST, Disp: 90 tablet, Rfl: 0   Magnesium 400 MG TABS, Take 1 tablet by mouth daily., Disp: , Rfl:    meclizine (ANTIVERT) 25 MG tablet, Take 12.5-25 mg by mouth 2 (two) times daily as needed for dizziness (vertigo)., Disp: , Rfl:    methocarbamol (ROBAXIN) 750 MG tablet, Take 500 mg by mouth  every 6 (six) hours as needed for muscle spasms. 1 at bedtime, Disp: , Rfl:    morphine (MSIR) 15 MG tablet, Take 15 mg by mouth 4 (four) times daily as needed for severe pain. , Disp: , Rfl:    Multiple Vitamins-Minerals (MAXIMUM DAILY GREEN PO), Take 1 Package by mouth 3 (three) times a week. It Works! Greens, Disp: , Rfl:    promethazine (PHENERGAN) 25 MG tablet, TAKE 1/2 TO 1 (ONE-HALF TO ONE) TABLET BY MOUTH EVERY 8 HOURS AS NEEDED FOR NAUSEA, Disp: 40 tablet, Rfl: 0   RESTASIS 0.05 % ophthalmic emulsion, Place 1 drop into both eyes 2 (two) times daily., Disp: , Rfl:    Semaglutide, 1 MG/DOSE, 4 MG/3ML SOPN, Inject 1 mg into the skin once a week., Disp: 3 mL, Rfl: 2   Semaglutide, 2 MG/DOSE, 8 MG/3ML SOPN, Inject 2 mg as directed once a week. To start with patient assistance re-enrollment in 2025., Disp:  , Rfl:    SUDOGEST MAXIMUM STRENGTH 30 MG tablet, TAKE 1 TABLET BY MOUTH TWICE DAILY AS NEEDED FOR CONGESTION, Disp: 90 tablet, Rfl: 0   traMADol (ULTRAM) 50 MG tablet, TAKE 1 TABLET BY MOUTH EVERY 6 HOURS AS NEEDED, Disp: 30 tablet, Rfl: 0   Vitamin D, Ergocalciferol, (DRISDOL) 1.25 MG (50000 UNIT) CAPS capsule, Take 1 capsule (50,000 Units total) by mouth every 7 (seven) days., Disp: 12 capsule, Rfl: 2   cetirizine (ZYRTEC ALLERGY) 10 MG tablet, Take 1 tablet (10 mg total) by mouth daily. (Patient not taking: Reported on 01/23/2023), Disp: 90 tablet, Rfl: 0   EPINEPHrine 0.3 mg/0.3 mL IJ SOAJ injection, Inject 0.3 mg into the muscle See admin instructions. (Patient not taking: Reported on 01/23/2023), Disp: , Rfl:    furosemide (LASIX) 80 MG tablet, TAKE 1 TABLET BY MOUTH ONCE DAILY AS NEEDED FOR FLUID (Patient not taking: Reported on 01/30/2023), Disp: 90 tablet, Rfl: 0   Allergies  Allergen Reactions   Doxycycline Anaphylaxis, Diarrhea and Nausea And Vomiting    headache   Oxycodone-Acetaminophen Hives and Nausea And Vomiting   Prochlorperazine Edisylate Other (See Comments)    Hyper and jitteriness   Amoxicillin Rash and Other (See Comments)    Has patient had a PCN reaction causing immediate rash, facial/tongue/throat swelling, SOB or lightheadedness with hypotension: Unknown Has patient had a PCN reaction causing severe rash involving mucus membranes or skin necrosis: Unknown Has patient had a PCN reaction that required hospitalization: No Has patient had a PCN reaction occurring within the last 10 years: Yes If all of the above answers are "NO", then may proceed with Cephalosporin use.    Compazine Other (See Comments)    Hyper/jitters/blotches   Eszopiclone Other (See Comments)    Metallic taste and became un effective    Keppra [Levetiracetam] Other (See Comments)    hyperactive   Latex    Levetiracetam Other (See Comments)    Jitters/hyper   Neurontin [Gabapentin] Nausea And  Vomiting   Propoxyphene Hives   Robitussin Dm Max Day-Night Hives   Tape     PAPER TAPE-skin burns/severe irritation  PATIENT DOES NOT TOLERATE PAPER TAPE   Trileptal [Oxcarbazepine] Other (See Comments)    REACTION: immune system suppressed   Bactrim [Sulfamethoxazole-Trimethoprim] Rash   Bupropion Rash   Codeine Rash   Hydrocodone-Acetaminophen Rash   Moxifloxacin Swelling and Rash   Propoxyphene N-Acetaminophen Nausea And Vomiting and Rash   Vicodin [Hydrocodone-Acetaminophen] Rash     Review of Systems  Constitutional: Negative.  HENT: Negative.    Eyes: Negative.   Respiratory: Negative.    Cardiovascular: Negative.   Endocrine: Negative.  Negative for polydipsia, polyphagia and polyuria.  Genitourinary:        Vaginal itching  Musculoskeletal:  Positive for arthralgias and gait problem.  Skin: Negative.   Psychiatric/Behavioral: Negative.       Today's Vitals   01/30/23 0956 01/30/23 1122  BP: (!) 150/90 (!) 142/90  Pulse: 94 95  Temp: 98.4 F (36.9 C)    There is no height or weight on file to calculate BMI.  Wt Readings from Last 3 Encounters:  10/31/22 (!) 368 lb (166.9 kg)  04/17/22 (!) 379 lb 3.2 oz (172 kg)  02/27/22 (!) 379 lb 9.6 oz (172.2 kg)    The 10-year ASCVD risk score (Arnett DK, et al., 2019) is: 7.1%   Values used to calculate the score:     Age: 67 years     Sex: Female     Is Non-Hispanic African American: No     Diabetic: Yes     Tobacco smoker: No     Systolic Blood Pressure: 142 mmHg     Is BP treated: Yes     HDL Cholesterol: 50 mg/dL     Total Cholesterol: 188 mg/dL  Objective:  Physical Exam Cardiovascular:     Rate and Rhythm: Normal rate and regular rhythm.  Pulmonary:     Effort: Pulmonary effort is normal.  Abdominal:     General: Bowel sounds are normal.  Musculoskeletal:        General: Tenderness present.  Skin:    General: Skin is warm.  Neurological:     Mental Status: She is alert.         Assessment  And Plan:  Uncontrolled type 2 diabetes mellitus with hyperglycemia (HCC) -     Hemoglobin A1c  Essential hypertension, benign -     Microalbumin / creatinine urine ratio -     POCT urinalysis dipstick -     CBC  Leg cramps -     Magnesium  Vitamin D deficiency -     VITAMIN D 25 Hydroxy (Vit-D Deficiency, Fractures)  Vitamin B12 deficiency -     Vitamin B12 -     Cyanocobalamin  Vaginal candidiasis -     Fluconazole; Take 1 tablet (150 mg total) by mouth every other day.  Dispense: 2 tablet; Refill: 0  Influenza vaccination declined  Varicella zoster virus (VZV) vaccination declined  COVID-19 vaccination declined  Hypertriglyceridemia -     Lipid panel  Class 3 severe obesity due to excess calories with serious comorbidity and body mass index (BMI) of 60.0 to 69.9 in adult [facility]) Assessment & Plan: She is encouraged to strive for BMI less than 30 to decrease cardiac risk. Advised to aim for at least 150 minutes of exercise per week.      Return in about 4 months (around 06/02/2023) for dm and bp check.  Patient was given opportunity to ask questions. Patient verbalized understanding of the plan and was able to repeat key elements of the plan. All questions were answered to their satisfaction.    I, [name], NP, have reviewed all documentation for this visit. The documentation on 02/05/2023 for the exam, diagnosis, procedures, and orders are all accurate and complete.    IF YOU HAVE BEEN REFERRED TO A SPECIALIST, IT MAY TAKE 1-2 WEEKS TO SCHEDULE/PROCESS THE REFERRAL. IF YOU HAVE NOT HEARD FROM US/SPECIALIST IN  TWO WEEKS, PLEASE GIVE [place] A CALL AT [phone] X [phone].

## 2023-01-30 NOTE — Patient Instructions (Signed)

## 2023-01-31 LAB — CBC
Hematocrit: 42.9 % (ref 34.0–46.6)
Hemoglobin: 14.1 g/dL (ref 11.1–15.9)
MCH: 30.5 pg (ref 26.6–33.0)
MCHC: 32.9 g/dL (ref 31.5–35.7)
MCV: 93 fL (ref 79–97)
Platelets: 339 10*3/uL (ref 150–450)
RBC: 4.62 x10E6/uL (ref 3.77–5.28)
RDW: 12.1 % (ref 11.7–15.4)
WBC: 8.4 10*3/uL (ref 3.4–10.8)

## 2023-01-31 LAB — VITAMIN B12: Vitamin B-12: 395 pg/mL (ref 232–1245)

## 2023-01-31 LAB — MICROALBUMIN / CREATININE URINE RATIO
Creatinine, Urine: 207.8 mg/dL
Microalb/Creat Ratio: 22 mg/g{creat} (ref 0–29)
Microalbumin, Urine: 46 ug/mL

## 2023-01-31 LAB — HEMOGLOBIN A1C
Est. average glucose Bld gHb Est-mCnc: 171 mg/dL
Hgb A1c MFr Bld: 7.6 % — ABNORMAL HIGH (ref 4.8–5.6)

## 2023-01-31 LAB — LIPID PANEL
Chol/HDL Ratio: 3.8 ratio (ref 0.0–4.4)
Cholesterol, Total: 188 mg/dL (ref 100–199)
HDL: 50 mg/dL (ref >39)
LDL Chol Calc (NIH): 106 mg/dL — ABNORMAL HIGH (ref 0–99)
Triglycerides: 185 mg/dL — ABNORMAL HIGH (ref 0–149)
VLDL Cholesterol Cal: 32 mg/dL (ref 5–40)

## 2023-01-31 LAB — VITAMIN D 25 HYDROXY (VIT D DEFICIENCY, FRACTURES): Vit D, 25-Hydroxy: 25.7 ng/mL — ABNORMAL LOW (ref 30.0–100.0)

## 2023-01-31 LAB — MAGNESIUM: Magnesium: 1.8 mg/dL (ref 1.6–2.3)

## 2023-02-06 DIAGNOSIS — E559 Vitamin D deficiency, unspecified: Secondary | ICD-10-CM | POA: Insufficient documentation

## 2023-02-06 DIAGNOSIS — E538 Deficiency of other specified B group vitamins: Secondary | ICD-10-CM | POA: Insufficient documentation

## 2023-02-06 MED ORDER — VITAMIN D (ERGOCALCIFEROL) 1.25 MG (50000 UNIT) PO CAPS: 50000.0000 [IU] | ORAL_CAPSULE | ORAL | 2 refills | Status: DC

## 2023-02-06 NOTE — Assessment & Plan Note (Signed)
She is encouraged to strive for BMI less than 30 to decrease cardiac risk. Advised to aim for at least 150 minutes of exercise per week.

## 2023-02-06 NOTE — Progress Notes (Signed)
HgbA1c  slightly increased to 7.6. The goal is less than 7, are you taking all you medications as prescribed? Also watch carbs and concentrated sweets. Triglycerides and LDL have gone up too, are you on any medication or supplement for cholesterol? Vitamin D was low. I have sent once weekly Vitamin D supplement to the pharmacy.   Thank you!

## 2023-02-06 NOTE — Assessment & Plan Note (Signed)
Low carbs and fat diet advised. Continue

## 2023-02-11 MED ORDER — ROSUVASTATIN CALCIUM 10 MG PO TABS: 10.0000 mg | ORAL_TABLET | Freq: Every day | ORAL | 3 refills | Status: DC

## 2023-02-11 NOTE — Addendum Note (Signed)
Addended by: Nilda Simmer T on: 02/11/2023 02:27 PM   Modules accepted: Orders

## 2023-02-14 ENCOUNTER — Telehealth: Payer: Medicare Other | Admitting: Nurse Practitioner

## 2023-02-14 DIAGNOSIS — H66009 Acute suppurative otitis media without spontaneous rupture of ear drum, unspecified ear: Secondary | ICD-10-CM

## 2023-02-14 MED ORDER — AZITHROMYCIN 250 MG PO TABS: ORAL_TABLET | ORAL | 0 refills | Status: DC

## 2023-02-14 NOTE — Progress Notes (Signed)
E-Visit for Ear Pain - Acute Otitis Media   We are sorry that you are not feeling well. Here is how we plan to help!  Based on what you have shared with me it looks like you have Acute Otitis Media.  Acute Otitis Media is an infection of the middle or "inner" ear. This type of infection can cause redness, inflammation, and fluid buildup behind the tympanic membrane (ear drum).  The usual symptoms include: Earache/Pain Fever Upper respiratory symptoms Lack of energy/Fatigue/Malaise Slight hearing loss gradually worsening- if the inner ear fills with fluid What causes middle ear infections? Most middle ear infections occur when an infection such as a cold, leads to a build-up of mucus in the middle ear and causes the Eustachian tube (a thin tube that runs from the middle ear to the back of the nose) to become swollen or blocked.   This means mucus can't drain away properly, making it easier for an infection to spread into the middle ear.  How middle ear infections are treated: Most ear infections clear up within three to five days and don't need any specific treatment. If necessary, tylenol or ibuprofen should be used to relieve pain and a high temperature.  If you develop a fever higher than 102, or any significantly worsening symptoms, this could indicate a more serious infection moving to the middle/inner and needs face to face evaluation in an office by a provider.   Antibiotics aren't routinely used to treat middle ear infections, although they may occasionally be prescribed if symptoms persist or are particularly severe. Given your presentation,   I have prescribed Azithromycin 250 mg two tablets by mouth on day 1, then 1 tablet by mouth daily until completed    Your symptoms should improve over the next 3 days and should resolve in about 7 days. Be sure to complete ALL of the prescription(s) given.  HOME CARE: Wash your hands frequently. If you are prescribed an ear drop, do not  place the tip of the bottle on your ear or touch it with your fingers. You can take Acetaminophen 650 mg every 4-6 hours as needed for pain.  If pain is severe or moderate, you can apply a heating pad (set on low) or hot water bottle (wrapped in a towel) to outer ear for 20 minutes.  This will also increase drainage.  GET HELP RIGHT AWAY IF: Fever is over 102.2 degrees. You develop progressive ear pain or hearing loss. Ear symptoms persist longer than 3 days after treatment.  MAKE SURE YOU: Understand these instructions. Will watch your condition. Will get help right away if you are not doing well or get worse.  Thank you for choosing an e-visit.  Your e-visit answers were reviewed by a board certified advanced clinical practitioner to complete your personal care plan. Depending upon the condition, your plan could have included both over the counter or prescription medications.  Please review your pharmacy choice. Make sure the pharmacy is open so you can pick up the prescription now. If there is a problem, you may contact your provider through [employer] and have the prescription routed to another pharmacy.  Your safety is important to [place]. If you have drug allergies check your prescription carefully.   For the next 24 hours you can use MyChart to ask questions about today's visit, request a non-urgent call back, or ask for a work or school excuse. You will get an email with a survey after your eVisit asking about  your experience. We would appreciate your feedback. I hope that your e-visit has been valuable and will aid in your recovery.  Meds ordered this encounter  Medications   azithromycin (ZITHROMAX) 250 MG tablet    Sig: Take 2 tablets on day 1, then 1 tablet daily on days 2 through 5    Dispense:  6 tablet    Refill:  0     I spent approximately 5 minutes reviewing the patient's history, current symptoms and coordinating their care today.

## 2023-03-13 ENCOUNTER — Encounter: Payer: Medicare Other | Admitting: Internal Medicine

## 2023-03-18 ENCOUNTER — Other Ambulatory Visit: Payer: Medicare Other

## 2023-03-18 DIAGNOSIS — H26491 Other secondary cataract, right eye: Secondary | ICD-10-CM | POA: Diagnosis not present

## 2023-03-18 DIAGNOSIS — Z961 Presence of intraocular lens: Secondary | ICD-10-CM | POA: Diagnosis not present

## 2023-03-18 DIAGNOSIS — H16223 Keratoconjunctivitis sicca, not specified as Sjogren's, bilateral: Secondary | ICD-10-CM | POA: Diagnosis not present

## 2023-03-18 DIAGNOSIS — E119 Type 2 diabetes mellitus without complications: Secondary | ICD-10-CM | POA: Diagnosis not present

## 2023-03-18 DIAGNOSIS — H526 Other disorders of refraction: Secondary | ICD-10-CM | POA: Diagnosis not present

## 2023-03-18 DIAGNOSIS — H01001 Unspecified blepharitis right upper eyelid: Secondary | ICD-10-CM | POA: Diagnosis not present

## 2023-03-18 DIAGNOSIS — H01005 Unspecified blepharitis left lower eyelid: Secondary | ICD-10-CM | POA: Diagnosis not present

## 2023-03-18 DIAGNOSIS — H01002 Unspecified blepharitis right lower eyelid: Secondary | ICD-10-CM | POA: Diagnosis not present

## 2023-03-18 DIAGNOSIS — H02831 Dermatochalasis of right upper eyelid: Secondary | ICD-10-CM | POA: Diagnosis not present

## 2023-03-18 DIAGNOSIS — H01004 Unspecified blepharitis left upper eyelid: Secondary | ICD-10-CM | POA: Diagnosis not present

## 2023-03-18 DIAGNOSIS — H02834 Dermatochalasis of left upper eyelid: Secondary | ICD-10-CM | POA: Diagnosis not present

## 2023-03-20 ENCOUNTER — Ambulatory Visit: Payer: Medicare Other | Admitting: Family Medicine

## 2023-03-20 ENCOUNTER — Encounter: Payer: Self-pay | Admitting: Family Medicine

## 2023-03-20 VITALS — BP 120/74 | HR 96 | Temp 98.1°F | Ht 66.0 in | Wt 370.0 lb

## 2023-03-20 DIAGNOSIS — E538 Deficiency of other specified B group vitamins: Secondary | ICD-10-CM | POA: Diagnosis not present

## 2023-03-20 DIAGNOSIS — E66813 Obesity, class 3: Secondary | ICD-10-CM

## 2023-03-20 DIAGNOSIS — E039 Hypothyroidism, unspecified: Secondary | ICD-10-CM | POA: Diagnosis not present

## 2023-03-20 DIAGNOSIS — H9209 Otalgia, unspecified ear: Secondary | ICD-10-CM

## 2023-03-20 DIAGNOSIS — H9201 Otalgia, right ear: Secondary | ICD-10-CM

## 2023-03-20 DIAGNOSIS — Z6841 Body Mass Index (BMI) 40.0 and over, adult: Secondary | ICD-10-CM

## 2023-03-20 MED ORDER — ACETIC ACID 2 % OT SOLN: 2.0000 [drp] | Freq: Three times a day (TID) | OTIC | 0 refills | Status: AC

## 2023-03-20 MED ORDER — CYANOCOBALAMIN 1000 MCG/ML IJ SOLN
1000.0000 ug | Freq: Once | INTRAMUSCULAR | Status: AC
  Administered 2023-03-20: 1000 ug via INTRAMUSCULAR

## 2023-03-20 NOTE — Progress Notes (Signed)
I,Jameka J Llittleton, CMA,acting as a Neurosurgeon for [employer], NP.,have documented all relevant documentation on the behalf of [name], NP,as directed by  [name], NP while in the presence of [name], NP.  Subjective:  Patient ID: [name] Wright , female    DOB: 12-Aug-1965 , 57 y.o.   MRN: [id number]  Chief Complaint  Patient presents with   Ear Pain    HPI  Patient is a 57 year old female who presents today for right ear pain. She states pain started on Saturday 03/16/2023  and she feels like sounds on that side is muffled, also she reported she felt drainage from her right ear and she cleaned it off. Patient denies blood or pus draining from ear. She also denies any fever. Rates pain 3/10      Past Medical History:  Diagnosis Date   Acute cystitis    Allergic rhinitis    Childhood asthma    no problems as adult - no inhaler   Chronic insomnia    Chronic pain due to trauma 02/2001   closed head trauma - Followed by Dr Linton [facility]   Complication of anesthesia    Dyspepsia    Family history of ovarian cancer    Head trauma 02/24/2001   closed   History of kidney stones    passed stone - no surgery required   Hypothyroidism    PONV (postoperative nausea and vomiting)    Pre-diabetes    SVD (spontaneous vaginal delivery)    fetal demise at 5 months     Family History  Problem Relation Age of Onset   Multiple sclerosis Mother 43   Bladder Cancer Mother 51   Heart Problems Father    Dementia Paternal Aunt    Ovarian cancer Maternal Grandmother    Lung cancer Maternal Grandfather    Tuberculosis Paternal Grandmother      Current Outpatient Medications:    acetic acid 2 % otic solution, Place 2 drops into the right ear 3 (three) times daily for 7 days., Disp: 2.1 mL, Rfl: 0   albuterol (PROAIR HFA) 108 (90 Base) MCG/ACT inhaler, Inhale 2 puffs into the lungs every 4 (four) hours as needed for wheezing or shortness of breath.,  Disp: 1 each, Rfl: 1   baclofen (LIORESAL) 10 MG tablet, Take by mouth., Disp: , Rfl:    butalbital-acetaminophen-caffeine (FIORICET) 50-325-40 MG tablet, Take 1 tablet by mouth every 4 (four) hours as needed., Disp: , Rfl:    cetirizine (ZYRTEC ALLERGY) 10 MG tablet, Take 1 tablet (10 mg total) by mouth daily., Disp: 90 tablet, Rfl: 0   Continuous Glucose Sensor (FREESTYLE LIBRE 3 SENSOR) MISC, 1 applicator by Does not apply route as directed. Place 1 sensor on the skin every 14 days. Use to check glucose continuously, Disp: 3 each, Rfl: 2   diphenhydrAMINE (BENADRYL) 25 mg capsule, Take 25 mg by mouth every 6 (six) hours as needed for allergies. , Disp: , Rfl:    EPINEPHrine 0.3 mg/0.3 mL IJ SOAJ injection, Inject 0.3 mg into the muscle See admin instructions., Disp: , Rfl:    fluconazole (DIFLUCAN) 150 MG tablet, Take 1 tablet (150 mg total) by mouth every other day., Disp: 2 tablet, Rfl: 0   fluticasone (FLONASE) 50 MCG/ACT nasal spray, Place 1 spray into both nostrils in the morning., Disp: , Rfl:    furosemide (LASIX) 80 MG tablet, TAKE 1 TABLET BY MOUTH ONCE DAILY AS NEEDED FOR  FLUID, Disp: 90 tablet, Rfl: 0   levothyroxine (SYNTHROID) 100 MCG tablet, TAKE 1 TABLET BY MOUTH BEFORE BREAKFAST, Disp: 90 tablet, Rfl: 0   Magnesium 400 MG TABS, Take 1 tablet by mouth daily., Disp: , Rfl:    meclizine (ANTIVERT) 25 MG tablet, Take 12.5-25 mg by mouth 2 (two) times daily as needed for dizziness (vertigo)., Disp: , Rfl:    methocarbamol (ROBAXIN) 750 MG tablet, Take 500 mg by mouth every 6 (six) hours as needed for muscle spasms. 1 at bedtime, Disp: , Rfl:    morphine (MSIR) 15 MG tablet, Take 15 mg by mouth 4 (four) times daily as needed for severe pain. , Disp: , Rfl:    Multiple Vitamins-Minerals (MAXIMUM DAILY GREEN PO), Take 1 Package by mouth 3 (three) times a week. It Works! Greens, Disp: , Rfl:    promethazine (PHENERGAN) 25 MG tablet, TAKE 1/2 TO 1 (ONE-HALF TO ONE) TABLET BY MOUTH EVERY 8  HOURS AS NEEDED FOR NAUSEA, Disp: 40 tablet, Rfl: 0   RESTASIS 0.05 % ophthalmic emulsion, Place 1 drop into both eyes 2 (two) times daily., Disp: , Rfl:    rosuvastatin (CRESTOR) 10 MG tablet, Take 1 tablet (10 mg total) by mouth daily., Disp: 90 tablet, Rfl: 3   Semaglutide, 1 MG/DOSE, 4 MG/3ML SOPN, Inject 1 mg into the skin once a week., Disp: 3 mL, Rfl: 2   SUDOGEST MAXIMUM STRENGTH 30 MG tablet, TAKE 1 TABLET BY MOUTH TWICE DAILY AS NEEDED FOR CONGESTION, Disp: 90 tablet, Rfl: 0   traMADol (ULTRAM) 50 MG tablet, TAKE 1 TABLET BY MOUTH EVERY 6 HOURS AS NEEDED, Disp: 30 tablet, Rfl: 0   Vitamin D, Ergocalciferol, (DRISDOL) 1.25 MG (50000 UNIT) CAPS capsule, Take 1 capsule (50,000 Units total) by mouth every 7 (seven) days., Disp: 5 capsule, Rfl: 2   Semaglutide, 2 MG/DOSE, 8 MG/3ML SOPN, Inject 2 mg as directed once a week. To start with patient assistance re-enrollment in 2025. (Patient not taking: Reported on 03/20/2023), Disp: , Rfl:    Allergies  Allergen Reactions   Doxycycline Anaphylaxis, Diarrhea and Nausea And Vomiting    headache   Oxycodone-Acetaminophen Hives and Nausea And Vomiting   Prochlorperazine Edisylate Other (See Comments)    Hyper and jitteriness   Amoxicillin Rash and Other (See Comments)    Has patient had a PCN reaction causing immediate rash, facial/tongue/throat swelling, SOB or lightheadedness with hypotension: Unknown Has patient had a PCN reaction causing severe rash involving mucus membranes or skin necrosis: Unknown Has patient had a PCN reaction that required hospitalization: No Has patient had a PCN reaction occurring within the last 10 years: Yes If all of the above answers are "NO", then may proceed with Cephalosporin use.    Compazine Other (See Comments)    Hyper/jitters/blotches   Eszopiclone Other (See Comments)    Metallic taste and became un effective    Keppra [Levetiracetam] Other (See Comments)    hyperactive   Latex    Levetiracetam  Other (See Comments)    Jitters/hyper   Neurontin [Gabapentin] Nausea And Vomiting   Propoxyphene Hives   Robitussin Dm Max Day-Night Hives   Tape     PAPER TAPE-skin burns/severe irritation  PATIENT DOES NOT TOLERATE PAPER TAPE   Trileptal [Oxcarbazepine] Other (See Comments)    REACTION: immune system suppressed   Bactrim [Sulfamethoxazole-Trimethoprim] Rash   Bupropion Rash   Codeine Rash   Hydrocodone-Acetaminophen Rash   Moxifloxacin Swelling and Rash   Propoxyphene N-Acetaminophen Nausea  And Vomiting and Rash   Vicodin [Hydrocodone-Acetaminophen] Rash     Review of Systems  Constitutional: Negative.   HENT:  Positive for ear discharge and ear pain. Negative for sinus pressure and sinus pain.   Eyes:  Negative for pain and discharge.  Respiratory: Negative.    Cardiovascular: Negative.   Musculoskeletal:  Positive for gait problem.  Skin: Negative.   Psychiatric/Behavioral: Negative.       Today's Vitals   03/20/23 1053  BP: 120/74  Pulse: 96  Temp: 98.1 F (36.7 C)  Weight: (!) 370 lb (167.8 kg)  Height: 5\' 6"  (1.676 m)  PainSc: 3   PainLoc: Ear   Body mass index is 59.72 kg/m.  Wt Readings from Last 3 Encounters:  03/20/23 (!) 370 lb (167.8 kg)  10/31/22 (!) 368 lb (166.9 kg)  04/17/22 (!) 379 lb 3.2 oz (172 kg)    The 10-year ASCVD risk score (Arnett DK, et al., 2019) is: 5.1%   Values used to calculate the score:     Age: 53 years     Sex: Female     Is Non-Hispanic African American: No     Diabetic: Yes     Tobacco smoker: No     Systolic Blood Pressure: 120 mmHg     Is BP treated: Yes     HDL Cholesterol: 50 mg/dL     Total Cholesterol: 188 mg/dL  Objective:  Physical Exam HENT:     Head: Normocephalic.     Right Ear: Tenderness present. A middle ear effusion is present. There is no impacted cerumen. Tympanic membrane is not erythematous.     Left Ear: There is no impacted cerumen. Tympanic membrane is not erythematous.  Cardiovascular:      Rate and Rhythm: Normal rate and regular rhythm.  Pulmonary:     Effort: Pulmonary effort is normal.     Breath sounds: Normal breath sounds.  Abdominal:     General: Bowel sounds are normal.  Musculoskeletal:        General: Tenderness present.  Neurological:     Mental Status: She is alert.         Assessment And Plan:  Otalgia, right ear -     Acetic Acid; Place 2 drops into the right ear 3 (three) times daily for 7 days.  Dispense: 2.1 mL; Refill: 0  Vitamin B12 deficiency -     Cyanocobalamin  Primary hypothyroidism -     TSH + free T4 -     CMP14+EGFR  Class 3 severe obesity due to excess calories with body mass index (BMI) of 50.0 to 59.9 in adult, unspecified whether serious comorbidity present [facility]) Assessment & Plan: She is encouraged to strive for BMI less than 30 to decrease cardiac risk. Advised to aim for at least 150 minutes of exercise per week.      Return if symptoms worsen or fail to improve, for keep appt.  Patient was given opportunity to ask questions. Patient verbalized understanding of the plan and was able to repeat key elements of the plan. All questions were answered to their satisfaction.    I, [name], NP, have reviewed all documentation for this visit. The documentation on 03/25/2023 for the exam, diagnosis, procedures, and orders are all accurate and complete.    IF YOU HAVE BEEN REFERRED TO A SPECIALIST, IT MAY TAKE 1-2 WEEKS TO SCHEDULE/PROCESS THE REFERRAL. IF YOU HAVE NOT HEARD FROM US/SPECIALIST IN TWO WEEKS, PLEASE GIVE [place] A CALL  AT [phone].

## 2023-03-21 LAB — CMP14+EGFR
ALT: 24 [IU]/L (ref 0–32)
AST: 24 [IU]/L (ref 0–40)
Albumin: 3.9 g/dL (ref 3.8–4.9)
Alkaline Phosphatase: 115 [IU]/L (ref 44–121)
BUN/Creatinine Ratio: 21 (ref 9–23)
BUN: 17 mg/dL (ref 6–24)
Bilirubin Total: 0.4 mg/dL (ref 0.0–1.2)
CO2: 20 mmol/L (ref 20–29)
Calcium: 9.4 mg/dL (ref 8.7–10.2)
Chloride: 101 mmol/L (ref 96–106)
Creatinine, Ser: 0.8 mg/dL (ref 0.57–1.00)
Globulin, Total: 3.3 g/dL (ref 1.5–4.5)
Glucose: 142 mg/dL — ABNORMAL HIGH (ref 70–99)
Potassium: 4.5 mmol/L (ref 3.5–5.2)
Sodium: 138 mmol/L (ref 134–144)
Total Protein: 7.2 g/dL (ref 6.0–8.5)
eGFR: 86 mL/min/{1.73_m2} (ref >59)

## 2023-03-21 LAB — TSH+FREE T4
Free T4: 1.23 ng/dL (ref 0.82–1.77)
TSH: 2.78 u[IU]/mL (ref 0.450–4.500)

## 2023-03-25 DIAGNOSIS — E66813 Obesity, class 3: Secondary | ICD-10-CM | POA: Insufficient documentation

## 2023-03-25 DIAGNOSIS — E039 Hypothyroidism, unspecified: Secondary | ICD-10-CM | POA: Insufficient documentation

## 2023-03-25 DIAGNOSIS — H9201 Otalgia, right ear: Secondary | ICD-10-CM | POA: Insufficient documentation

## 2023-03-25 NOTE — Assessment & Plan Note (Signed)
 She is encouraged to strive for BMI less than 30 to decrease cardiac risk. Advised to aim for at least 150 minutes of exercise per week.

## 2023-03-27 ENCOUNTER — Telehealth: Payer: Self-pay

## 2023-03-27 NOTE — Telephone Encounter (Signed)
PAP: Application for Ozempic has been submitted to PAP Companies: [employer], online  Please not provider portion has been faxed to Dr. Dorothyann Peng for review

## 2023-04-18 ENCOUNTER — Encounter: Payer: Self-pay | Admitting: Internal Medicine

## 2023-04-22 DIAGNOSIS — Z5181 Encounter for therapeutic drug level monitoring: Secondary | ICD-10-CM | POA: Diagnosis not present

## 2023-04-22 DIAGNOSIS — G894 Chronic pain syndrome: Secondary | ICD-10-CM | POA: Diagnosis not present

## 2023-04-22 DIAGNOSIS — M7918 Myalgia, other site: Secondary | ICD-10-CM | POA: Diagnosis not present

## 2023-04-22 DIAGNOSIS — R202 Paresthesia of skin: Secondary | ICD-10-CM | POA: Diagnosis not present

## 2023-04-22 DIAGNOSIS — Z79891 Long term (current) use of opiate analgesic: Secondary | ICD-10-CM | POA: Diagnosis not present

## 2023-04-22 DIAGNOSIS — M25562 Pain in left knee: Secondary | ICD-10-CM | POA: Diagnosis not present

## 2023-04-22 DIAGNOSIS — R519 Headache, unspecified: Secondary | ICD-10-CM | POA: Diagnosis not present

## 2023-04-22 DIAGNOSIS — M542 Cervicalgia: Secondary | ICD-10-CM | POA: Diagnosis not present

## 2023-04-22 DIAGNOSIS — M25561 Pain in right knee: Secondary | ICD-10-CM | POA: Diagnosis not present

## 2023-04-22 DIAGNOSIS — G4733 Obstructive sleep apnea (adult) (pediatric): Secondary | ICD-10-CM | POA: Diagnosis not present

## 2023-04-25 NOTE — Telephone Encounter (Signed)
Spoke with [place] at [employer].  She asked that we resubmit patients application as they do no accept PO boxes.  Will resubmit electronically with patient physical address

## 2023-04-26 NOTE — Telephone Encounter (Signed)
Received provider portion of application, faxed completed provider portion and copy of insurance card to [employer]

## 2023-05-06 NOTE — Telephone Encounter (Signed)
PAP: Patient assistance application for Ozempic has been approved by PAP Companies: [employer] from 05/04/23 to 04/08/24. Medication should be delivered to PAP Delivery: Provider's office. For further shipping updates, please [employer] at [phone]. Patient ID is: [id number]

## 2023-05-06 NOTE — Progress Notes (Signed)
Pharmacy Medication Assistance Program Note    05/06/2023  Patient ID: SAVANNAH MORFORD, female   DOB: 08/03/1965, 58 y.o.   MRN: [id number]     03/27/2023  Outreach Medication One  Initial Outreach Date (Medication One) 03/26/2023  Manufacturer Medication One [employer] [facility] Ozempic  Dose of Ozempic 2mg /week  Type of Radiographer, therapeutic Assistance  Date Application Sent to Prescriber 03/27/2023  Name of Prescriber Dorothyann Peng  Date Application Received From Provider 04/26/2023  Date Application Submitted to Manufacturer 04/26/2023  Method Application Sent to Manufacturer Fax  Patient Assistance Determination Approved  Approval Start Date 05/06/2023  Approval End Date 04/08/2024  Patient Notification Method MyChart     Signature Tresea Mall, CPHT/Patient [facility] Direct Line: [phone] Fax: [phone]

## 2023-05-09 ENCOUNTER — Other Ambulatory Visit: Payer: Self-pay

## 2023-05-09 ENCOUNTER — Ambulatory Visit: Payer: Medicare Other | Admitting: Family Medicine

## 2023-05-09 ENCOUNTER — Encounter: Payer: Self-pay | Admitting: Family Medicine

## 2023-05-09 VITALS — BP 124/76 | HR 86 | Temp 98.1°F | Ht 66.0 in | Wt 381.0 lb

## 2023-05-09 DIAGNOSIS — E66813 Obesity, class 3: Secondary | ICD-10-CM

## 2023-05-09 DIAGNOSIS — E1165 Type 2 diabetes mellitus with hyperglycemia: Secondary | ICD-10-CM | POA: Diagnosis not present

## 2023-05-09 DIAGNOSIS — E538 Deficiency of other specified B group vitamins: Secondary | ICD-10-CM

## 2023-05-09 DIAGNOSIS — N951 Menopausal and female climacteric states: Secondary | ICD-10-CM

## 2023-05-09 DIAGNOSIS — Z6841 Body Mass Index (BMI) 40.0 and over, adult: Secondary | ICD-10-CM

## 2023-05-09 DIAGNOSIS — Z01818 Encounter for other preprocedural examination: Secondary | ICD-10-CM

## 2023-05-09 DIAGNOSIS — R9431 Abnormal electrocardiogram [ECG] [EKG]: Secondary | ICD-10-CM

## 2023-05-09 MED ORDER — CYANOCOBALAMIN 1000 MCG/ML IJ SOLN
1000.0000 ug | Freq: Once | INTRAMUSCULAR | Status: AC
  Administered 2023-05-09: 1000 ug via INTRAMUSCULAR

## 2023-05-09 NOTE — Progress Notes (Signed)
I,Jameka J Llittleton, CMA,acting as a Neurosurgeon for [employer], NP.,have documented all relevant documentation on the behalf of [name], NP,as directed by  [name], NP while in the presence of [name], NP.  Subjective:  Patient ID: [name] Wright , female    DOB: [birth year] , 58 y.o.   MRN: [id number]  Chief Complaint  Patient presents with   Pre-op Exam    HPI  Patient is 58 year old presents today for a pre op evaluation. She is needs to be cleared for surgery .She reports she has a polyp in her uterus, she has been having her period for over 3 months  Surgery is scheduled for 06/07/2023 by Dr Hulan Fess, [facility], [place], [place].     Past Medical History:  Diagnosis Date   Acute cystitis    Allergic rhinitis    Childhood asthma    no problems as adult - no inhaler   Chronic insomnia    Chronic pain due to trauma 02/2001   closed head trauma - Followed by Dr Linton [facility]   Complication of anesthesia    Dyspepsia    Family history of ovarian cancer    Head trauma 02/24/2001   closed   History of kidney stones    passed stone - no surgery required   Hypothyroidism    PONV (postoperative nausea and vomiting)    Pre-diabetes    SVD (spontaneous vaginal delivery)    fetal demise at 5 months     Family History  Problem Relation Age of Onset   Multiple sclerosis Mother 11   Bladder Cancer Mother 29   Heart Problems Father    Dementia Paternal Aunt    Ovarian cancer Maternal Grandmother    Lung cancer Maternal Grandfather    Tuberculosis Paternal Grandmother      Current Outpatient Medications:    albuterol (PROAIR HFA) 108 (90 Base) MCG/ACT inhaler, Inhale 2 puffs into the lungs every 4 (four) hours as needed for wheezing or shortness of breath., Disp: 1 each, Rfl: 1   baclofen (LIORESAL) 10 MG tablet, Take by mouth., Disp: , Rfl:    butalbital-acetaminophen-caffeine (FIORICET) 50-325-40 MG tablet, Take 1 tablet  by mouth every 4 (four) hours as needed., Disp: , Rfl:    cetirizine (ZYRTEC ALLERGY) 10 MG tablet, Take 1 tablet (10 mg total) by mouth daily., Disp: 90 tablet, Rfl: 0   Continuous Glucose Sensor (FREESTYLE LIBRE 3 SENSOR) MISC, 1 applicator by Does not apply route as directed. Place 1 sensor on the skin every 14 days. Use to check glucose continuously, Disp: 3 each, Rfl: 2   diphenhydrAMINE (BENADRYL) 25 mg capsule, Take 25 mg by mouth every 6 (six) hours as needed for allergies. , Disp: , Rfl:    EPINEPHrine 0.3 mg/0.3 mL IJ SOAJ injection, Inject 0.3 mg into the muscle See admin instructions., Disp: , Rfl:    fluconazole (DIFLUCAN) 150 MG tablet, Take 1 tablet (150 mg total) by mouth every other day., Disp: 2 tablet, Rfl: 0   fluticasone (FLONASE) 50 MCG/ACT nasal spray, Place 1 spray into both nostrils in the morning., Disp: , Rfl:    furosemide (LASIX) 80 MG tablet, TAKE 1 TABLET BY MOUTH ONCE DAILY AS NEEDED FOR FLUID, Disp: 90 tablet, Rfl: 0   levothyroxine (SYNTHROID) 100 MCG tablet, TAKE 1 TABLET BY MOUTH BEFORE BREAKFAST, Disp: 90 tablet, Rfl: 0   Magnesium 400 MG TABS, Take 1 tablet by mouth daily.,  Disp: , Rfl:    meclizine (ANTIVERT) 25 MG tablet, Take 12.5-25 mg by mouth 2 (two) times daily as needed for dizziness (vertigo)., Disp: , Rfl:    medroxyPROGESTERone (PROVERA) 10 MG tablet, Take 10 mg by mouth daily., Disp: , Rfl:    methocarbamol (ROBAXIN) 750 MG tablet, Take 500 mg by mouth every 6 (six) hours as needed for muscle spasms. 1 at bedtime, Disp: , Rfl:    morphine (MSIR) 15 MG tablet, Take 15 mg by mouth 4 (four) times daily as needed for severe pain. , Disp: , Rfl:    Multiple Vitamins-Minerals (MAXIMUM DAILY GREEN PO), Take 1 Package by mouth 3 (three) times a week. It Works! Greens, Disp: , Rfl:    promethazine (PHENERGAN) 25 MG tablet, TAKE 1/2 TO 1 (ONE-HALF TO ONE) TABLET BY MOUTH EVERY 8 HOURS AS NEEDED FOR NAUSEA, Disp: 40 tablet, Rfl: 0   RESTASIS 0.05 % ophthalmic  emulsion, Place 1 drop into both eyes 2 (two) times daily., Disp: , Rfl:    rosuvastatin (CRESTOR) 10 MG tablet, Take 1 tablet (10 mg total) by mouth daily., Disp: 90 tablet, Rfl: 3   SUDOGEST MAXIMUM STRENGTH 30 MG tablet, TAKE 1 TABLET BY MOUTH TWICE DAILY AS NEEDED FOR CONGESTION, Disp: 90 tablet, Rfl: 0   traMADol (ULTRAM) 50 MG tablet, TAKE 1 TABLET BY MOUTH EVERY 6 HOURS AS NEEDED, Disp: 30 tablet, Rfl: 0   Vitamin D, Ergocalciferol, (DRISDOL) 1.25 MG (50000 UNIT) CAPS capsule, Take 1 capsule (50,000 Units total) by mouth every 7 (seven) days., Disp: 5 capsule, Rfl: 2   Semaglutide, 1 MG/DOSE, 4 MG/3ML SOPN, Inject 1 mg into the skin once a week. (Patient not taking: Reported on 05/09/2023), Disp: 3 mL, Rfl: 2   Semaglutide, 2 MG/DOSE, 8 MG/3ML SOPN, Inject 2 mg as directed once a week. To start with patient assistance re-enrollment in 2025. (Patient not taking: Reported on 05/09/2023), Disp: , Rfl:    Allergies  Allergen Reactions   Doxycycline Anaphylaxis, Diarrhea and Nausea And Vomiting    headache   Oxycodone-Acetaminophen Hives and Nausea And Vomiting   Prochlorperazine Edisylate Other (See Comments)    Hyper and jitteriness   Amoxicillin Rash and Other (See Comments)    Has patient had a PCN reaction causing immediate rash, facial/tongue/throat swelling, SOB or lightheadedness with hypotension: Unknown Has patient had a PCN reaction causing severe rash involving mucus membranes or skin necrosis: Unknown Has patient had a PCN reaction that required hospitalization: No Has patient had a PCN reaction occurring within the last 10 years: Yes If all of the above answers are "NO", then may proceed with Cephalosporin use.    Compazine Other (See Comments)    Hyper/jitters/blotches   Eszopiclone Other (See Comments)    Metallic taste and became un effective    Keppra [Levetiracetam] Other (See Comments)    hyperactive   Latex    Levetiracetam Other (See Comments)    Jitters/hyper    Neurontin [Gabapentin] Nausea And Vomiting   Propoxyphene Hives   Robitussin Dm Max Day-Night Hives   Tape     PAPER TAPE-skin burns/severe irritation  PATIENT DOES NOT TOLERATE PAPER TAPE   Trileptal [Oxcarbazepine] Other (See Comments)    REACTION: immune system suppressed   Bactrim [Sulfamethoxazole-Trimethoprim] Rash   Bupropion Rash   Codeine Rash   Hydrocodone-Acetaminophen Rash   Moxifloxacin Swelling and Rash   Propoxyphene N-Acetaminophen Nausea And Vomiting and Rash   Vicodin [Hydrocodone-Acetaminophen] Rash     Review  of Systems  Constitutional: Negative.   HENT: Negative.    Respiratory: Negative.    Cardiovascular:  Positive for leg swelling.  Gastrointestinal: Negative.   Genitourinary:  Positive for menstrual problem.  Musculoskeletal: Negative.   Skin: Negative.   Neurological: Negative.   Psychiatric/Behavioral: Negative.       Today's Vitals   05/09/23 1505 05/09/23 1625  BP: (!) 140/90 124/76  Pulse: 86   Temp: 98.1 F (36.7 C)   TempSrc: Oral   Weight: (!) 381 lb (172.8 kg)   Height: 5\' 6"  (1.676 m)    Body mass index is 61.5 kg/m.  Wt Readings from Last 3 Encounters:  05/09/23 (!) 381 lb (172.8 kg)  03/20/23 (!) 370 lb (167.8 kg)  10/31/22 (!) 368 lb (166.9 kg)    The 10-year ASCVD risk score (Arnett DK, et al., 2019) is: 5.4%   Values used to calculate the score:     Age: 6 years     Sex: Female     Is Non-Hispanic African American: No     Diabetic: Yes     Tobacco smoker: No     Systolic Blood Pressure: 124 mmHg     Is BP treated: Yes     HDL Cholesterol: 50 mg/dL     Total Cholesterol: 188 mg/dL  Objective:  Physical Exam HENT:     Head: Normocephalic.  Cardiovascular:     Rate and Rhythm: Normal rate and regular rhythm.  Pulmonary:     Effort: Pulmonary effort is normal.     Breath sounds: Normal breath sounds.  Abdominal:     General: Bowel sounds are normal.  Musculoskeletal:        General: Tenderness present. Normal  range of motion.  Skin:    General: Skin is warm.  Neurological:     Mental Status: She is alert and oriented to person, place, and time.  Psychiatric:        Mood and Affect: Mood normal.        Behavior: Behavior normal.         Assessment And Plan:  Pre-op exam -     EKG 12-Lead  Vitamin B12 deficiency -     Cyanocobalamin  Female climacteric state -     CMP14+EGFR -     CBC -     Follicle stimulating hormone  Class 3 severe obesity due to excess calories with serious comorbidity and body mass index (BMI) of 60.0 to 69.9 in adult [facility]) Assessment & Plan: She is encouraged to strive for BMI less than 30 to decrease cardiac risk. Advised to aim for at least 150 minutes of exercise per week.      Return for Keep scheduled appt.  Patient was given opportunity to ask questions. Patient verbalized understanding of the plan and was able to repeat key elements of the plan. All questions were answered to their satisfaction.    I, [name], NP, have reviewed all documentation for this visit. The documentation on 05/14/2023 for the exam, diagnosis, procedures, and orders are all accurate and complete.    IF YOU HAVE BEEN REFERRED TO A SPECIALIST, IT MAY TAKE 1-2 WEEKS TO SCHEDULE/PROCESS THE REFERRAL. IF YOU HAVE NOT HEARD FROM US/SPECIALIST IN TWO WEEKS, PLEASE GIVE [place] A CALL AT [phone] X [phone].

## 2023-05-10 LAB — CMP14+EGFR
ALT: 22 [IU]/L (ref 0–32)
AST: 28 [IU]/L (ref 0–40)
Albumin: 4 g/dL (ref 3.8–4.9)
Alkaline Phosphatase: 116 [IU]/L (ref 44–121)
BUN/Creatinine Ratio: 13 (ref 9–23)
BUN: 9 mg/dL (ref 6–24)
Bilirubin Total: <0.2 mg/dL (ref 0.0–1.2)
CO2: 23 mmol/L (ref 20–29)
Calcium: 9.6 mg/dL (ref 8.7–10.2)
Chloride: 98 mmol/L (ref 96–106)
Creatinine, Ser: 0.72 mg/dL (ref 0.57–1.00)
Globulin, Total: 3.2 g/dL (ref 1.5–4.5)
Glucose: 232 mg/dL — ABNORMAL HIGH (ref 70–99)
Potassium: 4.3 mmol/L (ref 3.5–5.2)
Sodium: 136 mmol/L (ref 134–144)
Total Protein: 7.2 g/dL (ref 6.0–8.5)
eGFR: 98 mL/min/{1.73_m2} (ref >59)

## 2023-05-10 LAB — CBC
Hematocrit: 41.7 % (ref 34.0–46.6)
Hemoglobin: 13.7 g/dL (ref 11.1–15.9)
MCH: 30.4 pg (ref 26.6–33.0)
MCHC: 32.9 g/dL (ref 31.5–35.7)
MCV: 93 fL (ref 79–97)
Platelets: 332 10*3/uL (ref 150–450)
RBC: 4.51 x10E6/uL (ref 3.77–5.28)
RDW: 11.7 % (ref 11.7–15.4)
WBC: 8.5 10*3/uL (ref 3.4–10.8)

## 2023-05-10 LAB — FOLLICLE STIMULATING HORMONE: FSH: 18.9 m[IU]/mL

## 2023-05-14 DIAGNOSIS — Z01818 Encounter for other preprocedural examination: Secondary | ICD-10-CM | POA: Insufficient documentation

## 2023-05-14 DIAGNOSIS — N951 Menopausal and female climacteric states: Secondary | ICD-10-CM | POA: Insufficient documentation

## 2023-05-14 NOTE — Assessment & Plan Note (Signed)
 She is encouraged to strive for BMI less than 30 to decrease cardiac risk. Advised to aim for at least 150 minutes of exercise per week.

## 2023-05-15 NOTE — Addendum Note (Signed)
Addended byMoshe Salisbury, Alandis Bluemel E on: 05/15/2023 12:49 PM   Modules accepted: Orders

## 2023-05-16 ENCOUNTER — Other Ambulatory Visit: Payer: Self-pay | Admitting: Family Medicine

## 2023-05-16 DIAGNOSIS — D509 Iron deficiency anemia, unspecified: Secondary | ICD-10-CM | POA: Diagnosis not present

## 2023-05-19 NOTE — H&P (Signed)
 ANGELEENA DUEITT is an 58 y.o. female. She has very heavy irregular bleeding. U/S in office is C/W multiple EM masses, could not visualize either ovary. Bleeding did not respond to oral progesterone. Lysteda did decrease bleeding but she had perioral and lingual tingling and it was discontinued. Megestrol then started and bleeding decreased.  Pertinent Gynecological History: Menses: flow is excessive with use of many pads or tampons on heaviest days Bleeding:  Contraception: vasectomy DES exposure: denies Blood transfusions: none Sexually transmitted diseases: no past history Previous GYN Procedures: DNC  Last mammogram: normal Date: 2023 Last pap: normal Date: 2023 OB History: G2, P0   Menstrual History: Menarche age:  No LMP recorded.    Past Medical History:  Diagnosis Date   Acute cystitis    Allergic rhinitis    Childhood asthma    no problems as adult - no inhaler   Chronic insomnia    Chronic pain due to trauma 02/2001   closed head trauma - Followed by Dr Elspeth Mems [facility]   Complication of anesthesia    Dyspepsia    Family history of ovarian cancer    Head trauma 02/24/2001   closed   History of kidney stones    passed stone - no surgery required   Hypothyroidism    PONV (postoperative nausea and vomiting)    Pre-diabetes    SVD (spontaneous vaginal delivery)    fetal demise at 5 months    Past Surgical History:  Procedure Laterality Date   APPENDECTOMY     BIOPSY  08/25/2020   Procedure: BIOPSY;  Surgeon: San Sandor GAILS, DO;  Location: [facility];  Service: Gastroenterology;;   COLONOSCOPY WITH PROPOFOL  N/A 08/25/2020   Procedure: COLONOSCOPY WITH PROPOFOL ;  Surgeon: San Sandor GAILS, DO;  Location: [facility];  Service: Gastroenterology;  Laterality: N/A;   DILATATION & CURETTAGE/HYSTEROSCOPY WITH MYOSURE N/A 04/15/2017   Procedure: DILATATION & CURETTAGE/HYSTEROSCOPY WITH MYOSURE POLYPECTOMY;  Surgeon: Curlene Agent, MD;   Location: [facility];  Service: Gynecology;  Laterality: N/A;   DILATATION & CURETTAGE/HYSTEROSCOPY WITH MYOSURE N/A 06/18/2019   Procedure: DILATATION & CURETTAGE/HYSTEROSCOPY WITH  MYOSURE;  Surgeon: Curlene Agent, MD;  Location: [facility];  Service: Gynecology;  Laterality: N/A;   ESOPHAGOGASTRODUODENOSCOPY (EGD) WITH PROPOFOL  N/A 08/25/2020   Procedure: ESOPHAGOGASTRODUODENOSCOPY (EGD) WITH PROPOFOL ;  Surgeon: San Sandor GAILS, DO;  Location: [facility];  Service: Gastroenterology;  Laterality: N/A;   EYE SURGERY     cataracts removed   INCONTINENCE SURGERY     PILONIDAL CYST EXCISION     x2   POLYPECTOMY  08/25/2020   Procedure: POLYPECTOMY;  Surgeon: San Sandor GAILS, DO;  Location: [facility];  Service: Gastroenterology;;   repair of toe laceration     dermabond to right big toe right foot   repair of torn ligament right leg     patient denies this surgery   right foot fracture     cast only   TONSILLECTOMY     WISDOM TOOTH EXTRACTION      Family History  Problem Relation Age of Onset   Multiple sclerosis Mother 56   Bladder Cancer Mother 60   Heart Problems Father    Dementia Paternal Aunt    Ovarian cancer Maternal Grandmother    Lung cancer Maternal Grandfather    Tuberculosis Paternal Grandmother     Social History:  reports that she has never smoked. She has never used smokeless tobacco. She reports current drug use. Drug: Morphine . She reports  that she does not drink alcohol.  Allergies:  Allergies  Allergen Reactions   Doxycycline  Anaphylaxis, Diarrhea and Nausea And Vomiting    headache   Oxycodone -Acetaminophen  Hives and Nausea And Vomiting   Prochlorperazine Edisylate Other (See Comments)    Hyper and jitteriness   Amoxicillin  Rash and Other (See Comments)    Has patient had a PCN reaction causing immediate rash, facial/tongue/throat swelling, SOB or lightheadedness with hypotension: Unknown Has patient had a PCN reaction causing severe rash involving mucus  membranes or skin necrosis: Unknown Has patient had a PCN reaction that required hospitalization: No Has patient had a PCN reaction occurring within the last 10 years: Yes If all of the above answers are NO, then may proceed with Cephalosporin use.    Compazine Other (See Comments)    Hyper/jitters/blotches   Eszopiclone Other (See Comments)    Metallic taste and became un effective    Keppra [Levetiracetam] Other (See Comments)    hyperactive   Latex    Levetiracetam Other (See Comments)    Jitters/hyper   Neurontin  [Gabapentin ] Nausea And Vomiting   Propoxyphene Hives   Robitussin Dm Max Day-Night Hives   Tape     PAPER TAPE-skin burns/severe irritation  PATIENT DOES NOT TOLERATE PAPER TAPE   Trileptal [Oxcarbazepine] Other (See Comments)    REACTION: immune system suppressed   Bactrim  [Sulfamethoxazole -Trimethoprim ] Rash   Bupropion Rash   Codeine Rash   Hydrocodone-Acetaminophen  Rash   Moxifloxacin Swelling and Rash   Propoxyphene N-Acetaminophen  Nausea And Vomiting and Rash   Vicodin [Hydrocodone-Acetaminophen ] Rash    No medications prior to admission.    Review of Systems  Constitutional:  Negative for fever.    There were no vitals taken for this visit. Physical Exam Cardiovascular:     Rate and Rhythm: Normal rate.  Pulmonary:     Effort: Pulmonary effort is normal.     No results found for this or any previous visit (from the past 24 hours).  No results found.  Assessment/Plan: 58 yo G2P0 with heavy AUB D/W H/S, D&C, possible Myosure and risks including infection, uterine perforation and organ damage, bleeding/transfusion-HIV/Hep, DVT/PE, pneumonia. She states she understands and agrees.   Lavere Stork E Taetum Flewellen II 05/19/2023, 6:01 PM

## 2023-05-30 ENCOUNTER — Encounter: Payer: Medicare Other | Admitting: Internal Medicine

## 2023-06-03 ENCOUNTER — Ambulatory Visit: Payer: Medicare Other | Admitting: Internal Medicine

## 2023-06-03 ENCOUNTER — Encounter (HOSPITAL_COMMUNITY): Payer: Self-pay | Admitting: Obstetrics and Gynecology

## 2023-06-03 NOTE — Progress Notes (Signed)
 Spoke w/ via phone for pre-op interview--- [facility] needs dos----   CBC, RPR, T&S and HIV per surgeon. BMP, A1C per anesthesia      Lab results------Current EKG in Epic dated 05/09/23. COVID test -----patient states asymptomatic no test needed Arrive at -------0530 NPO after MN NO Solid Food.   Pre-Surgery Ensure or G2:  Med rec completed Medications to take morning of surgery ----- Albuterol, Morphine and Baclofen. Fioricet PRN Diabetic medication ----- NONE  GLP1 agonist last dose: GLP1 instructions:  Patient instructed no nail polish to be worn day of surgery Patient instructed to bring photo id and insurance card day of surgery Patient aware to have Driver (ride ) / caregiver    for 24 hours after surgery - Husband Donita Brooks Patient Special Instructions ----- Shower with antibacterial soap night before or morning of surgery. Pre-Op special Instructions -----  Patient verbalized understanding of instructions that were given at this phone interview. Patient denies chest pain, sob, fever, cough at the interview.

## 2023-06-05 ENCOUNTER — Encounter: Payer: Self-pay | Admitting: Internal Medicine

## 2023-06-06 ENCOUNTER — Other Ambulatory Visit: Payer: Self-pay

## 2023-06-06 MED ORDER — GENTAMICIN SULFATE 40 MG/ML IJ SOLN
5.0000 mg/kg | INTRAVENOUS | Status: AC
  Administered 2023-06-07: 510 mg via INTRAVENOUS
  Filled 2023-06-06 (×2): qty 12.75

## 2023-06-06 MED ORDER — CLINDAMYCIN PHOSPHATE 900 MG/50ML IV SOLN
900.0000 mg | INTRAVENOUS | Status: AC
  Administered 2023-06-07: 900 mg via INTRAVENOUS
  Filled 2023-06-06: qty 50

## 2023-06-06 MED ORDER — MOUNJARO 5 MG/0.5ML ~~LOC~~ SOAJ: 5.0000 mg | SUBCUTANEOUS | 0 refills | Status: DC

## 2023-06-06 NOTE — Anesthesia Preprocedure Evaluation (Signed)
 Anesthesia Evaluation  Patient identified by MRN, date of birth, ID band Patient awake    Reviewed: Allergy & Precautions, NPO status , Patient's Chart, lab work & pertinent test results  History of Anesthesia Complications (+) PONV and history of anesthetic complications  Airway Mallampati: II  TM Distance: >3 FB Neck ROM: Full    Dental no notable dental hx.    Pulmonary asthma , sleep apnea    Pulmonary exam normal        Cardiovascular hypertension, Normal cardiovascular exam     Neuro/Psych  Headaches  Anxiety Depression       GI/Hepatic negative GI ROS, Neg liver ROS,,,  Endo/Other  diabetesHypothyroidism  Class 4 obesity  Renal/GU negative Renal ROS  negative genitourinary   Musculoskeletal negative musculoskeletal ROS (+)    Abdominal   Peds  Hematology negative hematology ROS (+)   Anesthesia Other Findings Day of surgery medications reviewed with patient.  Reproductive/Obstetrics PMB                             Anesthesia Physical Anesthesia Plan  ASA: 3  Anesthesia Plan: General   Post-op Pain Management: Tylenol PO (pre-op)* and Toradol IV (intra-op)*   Induction: Intravenous  PONV Risk Score and Plan: 4 or greater and Treatment may vary due to age or medical condition, Midazolam, Dexamethasone, Ondansetron and Propofol infusion  Airway Management Planned: Oral ETT  Additional Equipment: None  Intra-op Plan:   Post-operative Plan: Extubation in OR  Informed Consent: I have reviewed the patients History and Physical, chart, labs and discussed the procedure including the risks, benefits and alternatives for the proposed anesthesia with the patient or authorized representative who has indicated his/her understanding and acceptance.     Dental advisory given  Plan Discussed with: CRNA  Anesthesia Plan Comments:         Anesthesia Quick Evaluation

## 2023-06-07 ENCOUNTER — Ambulatory Visit (HOSPITAL_COMMUNITY): Payer: Medicare Other | Admitting: Anesthesiology

## 2023-06-07 ENCOUNTER — Other Ambulatory Visit: Payer: Self-pay

## 2023-06-07 ENCOUNTER — Encounter (HOSPITAL_COMMUNITY)
Admission: RE | Disposition: A | Discharge status: Home / Self Care | Payer: Self-pay | Source: Home / Self Care | Attending: Obstetrics and Gynecology

## 2023-06-07 ENCOUNTER — Ambulatory Visit (HOSPITAL_BASED_OUTPATIENT_CLINIC_OR_DEPARTMENT_OTHER): Payer: Medicare Other | Admitting: Anesthesiology

## 2023-06-07 ENCOUNTER — Encounter (HOSPITAL_COMMUNITY): Payer: Self-pay | Admitting: Obstetrics and Gynecology

## 2023-06-07 ENCOUNTER — Ambulatory Visit (HOSPITAL_COMMUNITY)
Admission: RE | Admit: 2023-06-07 | Discharge: 2023-06-07 | Disposition: A | Discharge status: Home / Self Care | Payer: Medicare Other | Attending: Obstetrics and Gynecology | Admitting: Obstetrics and Gynecology

## 2023-06-07 DIAGNOSIS — R7303 Prediabetes: Secondary | ICD-10-CM | POA: Diagnosis not present

## 2023-06-07 DIAGNOSIS — E119 Type 2 diabetes mellitus without complications: Secondary | ICD-10-CM | POA: Diagnosis not present

## 2023-06-07 DIAGNOSIS — N858 Other specified noninflammatory disorders of uterus: Secondary | ICD-10-CM

## 2023-06-07 DIAGNOSIS — J45909 Unspecified asthma, uncomplicated: Secondary | ICD-10-CM

## 2023-06-07 DIAGNOSIS — N939 Abnormal uterine and vaginal bleeding, unspecified: Secondary | ICD-10-CM | POA: Diagnosis present

## 2023-06-07 DIAGNOSIS — I10 Essential (primary) hypertension: Secondary | ICD-10-CM | POA: Diagnosis not present

## 2023-06-07 DIAGNOSIS — E039 Hypothyroidism, unspecified: Secondary | ICD-10-CM

## 2023-06-07 DIAGNOSIS — Z01818 Encounter for other preprocedural examination: Secondary | ICD-10-CM

## 2023-06-07 HISTORY — DX: Essential (primary) hypertension: I10

## 2023-06-07 HISTORY — PX: DILATATION & CURETTAGE/HYSTEROSCOPY WITH MYOSURE: SHX6511

## 2023-06-07 HISTORY — DX: Sleep apnea, unspecified: G47.30

## 2023-06-07 LAB — BASIC METABOLIC PANEL
Anion gap: 10 (ref 5–15)
BUN: 11 mg/dL (ref 6–20)
CO2: 20 mmol/L — ABNORMAL LOW (ref 22–32)
Calcium: 8.9 mg/dL (ref 8.9–10.3)
Chloride: 105 mmol/L (ref 98–111)
Creatinine, Ser: 0.74 mg/dL (ref 0.44–1.00)
GFR, Estimated: >60 mL/min (ref >60)
Glucose, Bld: 297 mg/dL — ABNORMAL HIGH (ref 70–99)
Potassium: 4.4 mmol/L (ref 3.5–5.1)
Sodium: 135 mmol/L (ref 135–145)

## 2023-06-07 LAB — CBC
HCT: 42.4 % (ref 36.0–46.0)
Hemoglobin: 14 g/dL (ref 12.0–15.0)
MCH: 29.8 pg (ref 26.0–34.0)
MCHC: 33 g/dL (ref 30.0–36.0)
MCV: 90.2 fL (ref 80.0–100.0)
Platelets: 307 10*3/uL (ref 150–400)
RBC: 4.7 MIL/uL (ref 3.87–5.11)
RDW: 11.9 % (ref 11.5–15.5)
WBC: 8.1 10*3/uL (ref 4.0–10.5)
nRBC: 0 % (ref 0.0–0.2)

## 2023-06-07 LAB — GLUCOSE, CAPILLARY: Glucose-Capillary: 299 mg/dL — ABNORMAL HIGH (ref 70–99)

## 2023-06-07 LAB — TYPE AND SCREEN
ABO/RH(D): O POS
Antibody Screen: NEGATIVE

## 2023-06-07 LAB — HEMOGLOBIN A1C
Hgb A1c MFr Bld: 8.9 % — ABNORMAL HIGH (ref 4.8–5.6)
Mean Plasma Glucose: 208.73 mg/dL

## 2023-06-07 LAB — POCT PREGNANCY, URINE: Preg Test, Ur: NEGATIVE

## 2023-06-07 SURGERY — DILATATION & CURETTAGE/HYSTEROSCOPY WITH MYOSURE
Anesthesia: General | Site: Uterus

## 2023-06-07 MED ORDER — ORAL CARE MOUTH RINSE: 15.0000 mL | Freq: Once | OROMUCOSAL | Status: AC

## 2023-06-07 MED ORDER — HYDROMORPHONE HCL 1 MG/ML IJ SOLN: 0.2500 mg | INTRAMUSCULAR | Status: DC | PRN

## 2023-06-07 MED ORDER — MIDAZOLAM HCL 2 MG/2ML IJ SOLN
INTRAMUSCULAR | Status: DC | PRN
Start: 2023-06-07 — End: 2023-06-07
  Administered 2023-06-07: 2 mg via INTRAVENOUS

## 2023-06-07 MED ORDER — LACTATED RINGERS IV SOLN: INTRAVENOUS | Status: DC

## 2023-06-07 MED ORDER — PHENYLEPHRINE 80 MCG/ML (10ML) SYRINGE FOR IV PUSH (FOR BLOOD PRESSURE SUPPORT)
PREFILLED_SYRINGE | INTRAVENOUS | Status: DC | PRN
Start: 2023-06-07 — End: 2023-06-07
  Administered 2023-06-07: 160 ug via INTRAVENOUS

## 2023-06-07 MED ORDER — SUCCINYLCHOLINE CHLORIDE 200 MG/10ML IV SOSY
PREFILLED_SYRINGE | INTRAVENOUS | Status: DC | PRN
  Administered 2023-06-07: 200 mg via INTRAVENOUS

## 2023-06-07 MED ORDER — LIDOCAINE HCL (PF) 1 % IJ SOLN
INTRAMUSCULAR | Status: AC
  Filled 2023-06-07: qty 30

## 2023-06-07 MED ORDER — CHLORHEXIDINE GLUCONATE 0.12 % MT SOLN
OROMUCOSAL | Status: DC
Start: 2023-06-07 — End: 2023-06-07
  Filled 2023-06-07: qty 15

## 2023-06-07 MED ORDER — ACETAMINOPHEN 500 MG PO TABS
ORAL_TABLET | ORAL | Status: AC
  Filled 2023-06-07: qty 2

## 2023-06-07 MED ORDER — SOD CITRATE-CITRIC ACID 500-334 MG/5ML PO SOLN: 30.0000 mL | ORAL | Status: DC

## 2023-06-07 MED ORDER — MIDAZOLAM HCL 2 MG/2ML IJ SOLN
INTRAMUSCULAR | Status: AC
  Filled 2023-06-07: qty 2

## 2023-06-07 MED ORDER — DEXAMETHASONE SODIUM PHOSPHATE 10 MG/ML IJ SOLN
INTRAMUSCULAR | Status: DC | PRN
  Administered 2023-06-07: 10 mg via INTRAVENOUS

## 2023-06-07 MED ORDER — PROPOFOL 10 MG/ML IV BOLUS
INTRAVENOUS | Status: DC | PRN
  Administered 2023-06-07: 200 mg via INTRAVENOUS

## 2023-06-07 MED ORDER — FENTANYL CITRATE (PF) 250 MCG/5ML IJ SOLN
INTRAMUSCULAR | Status: AC
  Filled 2023-06-07: qty 5

## 2023-06-07 MED ORDER — LIDOCAINE HCL 1 % IJ SOLN
INTRAMUSCULAR | Status: DC | PRN
  Administered 2023-06-07: 20 mL

## 2023-06-07 MED ORDER — PROPOFOL 10 MG/ML IV BOLUS
INTRAVENOUS | Status: AC
  Filled 2023-06-07: qty 20

## 2023-06-07 MED ORDER — ONDANSETRON HCL 4 MG/2ML IJ SOLN
INTRAMUSCULAR | Status: DC | PRN
  Administered 2023-06-07: 4 mg via INTRAVENOUS

## 2023-06-07 MED ORDER — OXYCODONE HCL 5 MG/5ML PO SOLN: 5.0000 mg | Freq: Once | ORAL | Status: DC | PRN

## 2023-06-07 MED ORDER — POVIDONE-IODINE 10 % EX SWAB: 2.0000 | Freq: Once | CUTANEOUS | Status: DC

## 2023-06-07 MED ORDER — FENTANYL CITRATE (PF) 100 MCG/2ML IJ SOLN: 25.0000 ug | INTRAMUSCULAR | Status: DC | PRN

## 2023-06-07 MED ORDER — INSULIN ASPART 100 UNIT/ML IJ SOLN
0.0000 [IU] | INTRAMUSCULAR | Status: DC | PRN
  Administered 2023-06-07: 8 [IU] via SUBCUTANEOUS

## 2023-06-07 MED ORDER — OXYCODONE HCL 5 MG PO TABS: 5.0000 mg | ORAL_TABLET | Freq: Once | ORAL | Status: DC | PRN

## 2023-06-07 MED ORDER — DROPERIDOL 2.5 MG/ML IJ SOLN: 0.6250 mg | Freq: Once | INTRAMUSCULAR | Status: DC | PRN

## 2023-06-07 MED ORDER — CHLORHEXIDINE GLUCONATE 0.12 % MT SOLN
15.0000 mL | Freq: Once | OROMUCOSAL | Status: AC
  Administered 2023-06-07: 15 mL via OROMUCOSAL

## 2023-06-07 MED ORDER — SODIUM CHLORIDE 0.9 % IR SOLN
Status: DC | PRN
  Administered 2023-06-07: 3000 mL

## 2023-06-07 MED ORDER — FENTANYL CITRATE (PF) 250 MCG/5ML IJ SOLN
INTRAMUSCULAR | Status: DC | PRN
  Administered 2023-06-07: 100 ug via INTRAVENOUS

## 2023-06-07 MED ORDER — SILVER NITRATE-POT NITRATE 75-25 % EX MISC
CUTANEOUS | Status: AC
  Filled 2023-06-07: qty 10

## 2023-06-07 MED ORDER — ACETAMINOPHEN 500 MG PO TABS: 1000.0000 mg | ORAL_TABLET | Freq: Once | ORAL | Status: DC

## 2023-06-07 MED ORDER — ACETAMINOPHEN 500 MG PO TABS
1000.0000 mg | ORAL_TABLET | ORAL | Status: AC
  Administered 2023-06-07: 1000 mg via ORAL

## 2023-06-07 MED ORDER — ROCURONIUM BROMIDE 10 MG/ML (PF) SYRINGE
PREFILLED_SYRINGE | INTRAVENOUS | Status: DC | PRN
  Administered 2023-06-07: 10 mg via INTRAVENOUS

## 2023-06-07 MED ORDER — LIDOCAINE 2% (20 MG/ML) 5 ML SYRINGE
INTRAMUSCULAR | Status: DC | PRN
  Administered 2023-06-07: 100 mg via INTRAVENOUS

## 2023-06-07 SURGICAL SUPPLY — 13 items
CATH ROBINSON RED A/P 16FR (CATHETERS) ×1 IMPLANT
DEVICE MYOSURE LITE (MISCELLANEOUS) IMPLANT
DEVICE MYOSURE REACH (MISCELLANEOUS) IMPLANT
GLOVE BIO SURGEON STRL SZ8 (GLOVE) ×2 IMPLANT
GLOVE SURG UNDER POLY LF SZ7 (GLOVE) ×1 IMPLANT
GOWN STRL REUS W/ TWL LRG LVL3 (GOWN DISPOSABLE) ×2 IMPLANT
KIT PROCEDURE FLUENT (KITS) ×1 IMPLANT
KIT TURNOVER KIT B (KITS) ×1 IMPLANT
PACK VAGINAL MINOR WOMEN LF (CUSTOM PROCEDURE TRAY) ×1 IMPLANT
PAD OB MATERNITY 11 LF (PERSONAL CARE ITEMS) ×1 IMPLANT
SEAL ROD LENS SCOPE MYOSURE (ABLATOR) ×1 IMPLANT
TOWEL GREEN STERILE FF (TOWEL DISPOSABLE) ×2 IMPLANT
UNDERPAD 30X36 HEAVY ABSORB (UNDERPADS AND DIAPERS) ×1 IMPLANT

## 2023-06-07 NOTE — Anesthesia Procedure Notes (Signed)
 Procedure Name: Intubation Date/Time: 06/07/2023 7:59 AM  Performed by: Venia Carbon, CRNAPre-anesthesia Checklist: Patient identified, Emergency Drugs available, Suction available, Patient being monitored and Timeout performed Patient Re-evaluated:Patient Re-evaluated prior to induction Oxygen Delivery Method: Circle system utilized Preoxygenation: Pre-oxygenation with 100% oxygen Induction Type: IV induction Ventilation: Mask ventilation without difficulty Laryngoscope Size: Glidescope and 3 Grade View: Grade I Tube type: Oral Tube size: 7.0 mm Number of attempts: 1 Airway Equipment and Method: Patient positioned with wedge pillow and Stylet Placement Confirmation: ETT inserted through vocal cords under direct vision, positive ETCO2, breath sounds checked- equal and bilateral and CO2 detector Secured at: 21 cm Tube secured with: Tape

## 2023-06-07 NOTE — Transfer of Care (Signed)
 Immediate Anesthesia Transfer of Care Note  Patient: ARADHANA GIN  Procedure(s) Performed: DILATATION & CURETTAGE/HYSTEROSCOPY WITH  MYOSURE (Uterus)  Patient Location: PACU  Anesthesia Type:General  Level of Consciousness: awake, alert , oriented, and patient cooperative  Airway & Oxygen Therapy: Patient Spontanous Breathing and Patient connected to face mask oxygen  Post-op Assessment: Report given to RN, Post -op Vital signs reviewed and stable, Patient moving all extremities, Patient moving all extremities X 4, and Patient able to stick tongue midline  Post vital signs: Reviewed and stable  Last Vitals:  Vitals Value Taken Time  BP 185/112 06/07/23 0833  Temp    Pulse 99 06/07/23 0836  Resp 23 06/07/23 0836  SpO2 100 % 06/07/23 0836  Vitals shown include unfiled device data.  Last Pain:  Vitals:   06/07/23 0617  TempSrc: Oral  PainSc: 5       Patients Stated Pain Goal: 5 (06/07/23 0617)  Complications: No notable events documented.

## 2023-06-07 NOTE — Op Note (Signed)
 NAME: [name] Wright, [name] Wright MEDICAL RECORD NO: [id number] ACCOUNT NO: [id number] DATE OF BIRTH: [birth year] FACILITY: [facility] LOCATION: [facility] PHYSICIAN: Guy Sandifer. Arleta Creek, MD  Operative Report   DATE OF PROCEDURE: 06/07/2023  PREOPERATIVE DIAGNOSIS:  Abnormal uterine bleeding.  POSTOPERATIVE DIAGNOSIS:  Abnormal uterine bleeding.  PROCEDURE:  Hysteroscopy, dilation and curettage, and MyoSure resection.  SURGEON:  Guy Sandifer. Arleta Creek, MD  ANESTHESIA:  General with endotracheal intubation.  ESTIMATED BLOOD LOSS:  30 mL.  DEFICIT OF DISTENDING MEDIA:  475 mL.  SPECIMENS:  Endometrial curettings to pathology.  INDICATIONS AND CONSENT:  This patient is a 58 year old patient with persistent and recurrent vaginal bleeding.  Ultrasound in the office is suggestive of an endometrial mass.  Hysteroscopy, dilation and curettage and possible MyoSure resection has been  discussed preoperatively.  Potential risks and complications have been discussed preoperatively with the patient including, but not limited to infection, organ damage, uterine perforation, bleeding requiring transfusion of blood products with HIV and  hepatitis acquisition, DVT, PE, and pneumonia.  She states she understands and agrees and consent is signed on the chart.  FINDINGS:  Both fallopian tubes ostia are noted.  There are multiple polypoid type masses as well as clot throughout the cavity.  Fallopian tube ostia are identified and there is good distention of the uterine cavity before and after D and C.  DESCRIPTION OF PROCEDURE:  The patient was taken to the operating room where she was identified, placed in the dorsal supine position, and general anesthesia was induced via endotracheal intubation.  She was placed in the dorsal lithotomy position.   Timeout is done.  She was prepped with Betadine, bladder straight catheterized, and draped in a sterile fashion. She receives intravenous gentamicin and Clindamycin.  Bivalve  speculum was placed.  Anterior cervical lip was injected with 1% plain  lidocaine and grasped with a single-tooth tenaculum.  Paracervical block of approximately 20 mL of the same solution was placed at the 2, 4, 5, 7, 8, and 10 o'clock positions.  Cervix was gently progressively dilated.  Hysteroscope was placed in the  endometrial cavity.  After inspection, it was then withdrawn and gentle sharp curettage was done productive of tissue.  Reinspection reveals some of the polypoid type masses remaining.  The MyoSure was then used to resect these without difficulty.   Finally, a reinspection again reveals good distention of the cavity.  Instruments are removed.  All counts are correct.  The patient was awakened and taken to the recovery room in stable condition.   PUS D: 06/07/2023 8:28:21 am T: 06/07/2023 8:51:00 am  JOB: [id number]/ [id number]

## 2023-06-07 NOTE — Discharge Instructions (Signed)
  Post Anesthesia Home Care Instructions  Activity: Get plenty of rest for the remainder of the day. A responsible individual must stay with you for 24 hours following the procedure.  For the next 24 hours, DO NOT: -Drive a car -Operate machinery -Drink alcoholic beverages -Take any medication unless instructed by your physician -Make any legal decisions or sign important papers.  Meals: Start with liquid foods such as gelatin or soup. Progress to regular foods as tolerated. Avoid greasy, spicy, heavy foods. If nausea and/or vomiting occur, drink only clear liquids until the nausea and/or vomiting subsides. Call your physician if vomiting continues.  Special Instructions/Symptoms: Your throat may feel dry or sore from the anesthesia or the breathing tube placed in your throat during surgery. If this causes discomfort, gargle with warm salt water. The discomfort should disappear within 24 hours.  D & C Home care Instructions:   Personal hygiene:  Used sanitary napkins for vaginal drainage not tampons. Shower or tub bathe the day after your procedure. No douching until bleeding stops. Always wipe from front to back after  Elimination.  Activity: Do not drive or operate any equipment today. The effects of the anesthesia are still present and drowsiness may result. Rest today, not necessarily flat bed rest, just take it easy. You may resume your normal activity in one to 2 days.  Sexual activity: No intercourse for one week or as indicated by your physician  Diet: Eat a light diet as desired this evening. You may resume a regular diet tomorrow.  Return to work: One to 2 days.  General Expectations of your surgery: Vaginal bleeding should be no heavier than a normal period. Spotting may continue up to 10 days. Mild cramps may continue for a couple of days. You may have a regular period in 2-6 weeks.  Unexpected observations call your doctor if these occur: persistent or heavy bleeding.  Severe abdominal cramping or pain. Elevation of temperature greater than 100F.  Call for an appointment in one week.    

## 2023-06-07 NOTE — Progress Notes (Signed)
 No changes to H&P per patient history. Continues to have some bleeding Reviewed procedure-H/S, D&C, possible Myosure resection PO instructions reviewed All questions answered She states she understands and agrees

## 2023-06-07 NOTE — Anesthesia Postprocedure Evaluation (Signed)
 Anesthesia Post Note  Patient: [name] Wright  Procedure(s) Performed: DILATATION & CURETTAGE/HYSTEROSCOPY WITH  MYOSURE (Uterus)     Patient location during evaluation: PACU Anesthesia Type: General Level of consciousness: awake and alert Pain management: pain level controlled Vital Signs Assessment: post-procedure vital signs reviewed and stable Respiratory status: spontaneous breathing, nonlabored ventilation and respiratory function stable Cardiovascular status: blood pressure returned to baseline Postop Assessment: no apparent nausea or vomiting Anesthetic complications: no   No notable events documented.  Last Vitals:  Vitals:   06/07/23 0950 06/07/23 0953  BP: (!) 162/93   Pulse: 72   Resp:    Temp:  37.2 C  SpO2:                [name]

## 2023-06-07 NOTE — Progress Notes (Signed)
 06/07/2023  8:22 AM  PATIENT:  [name] Wright  58 y.o. female  PRE-OPERATIVE DIAGNOSIS:  abnormal uterine bleeding  POST-OPERATIVE DIAGNOSIS:  abnormal uterine bleeding  PROCEDURE:  Procedure(s) with comments: DILATATION & CURETTAGE/HYSTEROSCOPY WITH POSSIBLE MYOSURE (N/A) - POSSIBLE MYOSURE [facility]  SURGEON:  Surgeons and Role:    * Harold Hedge, MD - Primary  PHYSICIAN ASSISTANT:   ASSISTANTS:   ANESTHESIA:   general  EBL:  30 ml  BLOOD ADMINISTERED:none  DRAINS: none   LOCAL MEDICATIONS USED:  LIDOCAINE  and Amount: 20 ml  SPECIMEN:  Source of Specimen:  endometrial curettings   DISPOSITION OF SPECIMEN:  PATHOLOGY  COUNTS:  YES  TOURNIQUET:  * No tourniquets in log *  DICTATION: .Other Dictation: Dictation Number [id number]  PLAN OF CARE: Discharge to home after PACU  PATIENT DISPOSITION:  PACU - hemodynamically stable.   Delay start of Pharmacological VTE agent (>24hrs) due to surgical blood loss or risk of bleeding: not applicable

## 2023-06-08 ENCOUNTER — Encounter (HOSPITAL_COMMUNITY): Payer: Self-pay | Admitting: Obstetrics and Gynecology

## 2023-06-10 LAB — SURGICAL PATHOLOGY

## 2023-06-11 ENCOUNTER — Telehealth: Payer: Self-pay

## 2023-06-11 NOTE — Telephone Encounter (Signed)
 PA for mounjaro has been submitted through covermymeds. We are waiting on the determination. YL,RMA

## 2023-06-11 NOTE — Telephone Encounter (Signed)
 Rx Verified YL,RMA

## 2023-06-18 ENCOUNTER — Encounter: Payer: Self-pay | Admitting: Internal Medicine

## 2023-06-21 DIAGNOSIS — D23112 Other benign neoplasm of skin of right lower eyelid, including canthus: Secondary | ICD-10-CM | POA: Diagnosis not present

## 2023-06-21 DIAGNOSIS — H26491 Other secondary cataract, right eye: Secondary | ICD-10-CM | POA: Diagnosis not present

## 2023-06-21 DIAGNOSIS — H16223 Keratoconjunctivitis sicca, not specified as Sjogren's, bilateral: Secondary | ICD-10-CM | POA: Diagnosis not present

## 2023-06-21 DIAGNOSIS — H02834 Dermatochalasis of left upper eyelid: Secondary | ICD-10-CM | POA: Diagnosis not present

## 2023-06-21 DIAGNOSIS — H526 Other disorders of refraction: Secondary | ICD-10-CM | POA: Diagnosis not present

## 2023-06-21 DIAGNOSIS — H57813 Brow ptosis, bilateral: Secondary | ICD-10-CM | POA: Diagnosis not present

## 2023-06-21 DIAGNOSIS — E119 Type 2 diabetes mellitus without complications: Secondary | ICD-10-CM | POA: Diagnosis not present

## 2023-06-21 DIAGNOSIS — Z961 Presence of intraocular lens: Secondary | ICD-10-CM | POA: Diagnosis not present

## 2023-06-21 DIAGNOSIS — H02831 Dermatochalasis of right upper eyelid: Secondary | ICD-10-CM | POA: Diagnosis not present

## 2023-06-24 LAB — HM DIABETES EYE EXAM

## 2023-06-25 ENCOUNTER — Encounter: Payer: Self-pay | Admitting: Internal Medicine

## 2023-06-25 ENCOUNTER — Ambulatory Visit (INDEPENDENT_AMBULATORY_CARE_PROVIDER_SITE_OTHER): Admitting: Internal Medicine

## 2023-06-25 VITALS — BP 128/82 | HR 99 | Temp 98.1°F | Ht 66.0 in | Wt 366.8 lb

## 2023-06-25 DIAGNOSIS — E7800 Pure hypercholesterolemia, unspecified: Secondary | ICD-10-CM

## 2023-06-25 DIAGNOSIS — E039 Hypothyroidism, unspecified: Secondary | ICD-10-CM | POA: Diagnosis not present

## 2023-06-25 DIAGNOSIS — E66813 Obesity, class 3: Secondary | ICD-10-CM

## 2023-06-25 DIAGNOSIS — B370 Candidal stomatitis: Secondary | ICD-10-CM

## 2023-06-25 DIAGNOSIS — I10 Essential (primary) hypertension: Secondary | ICD-10-CM

## 2023-06-25 DIAGNOSIS — E1165 Type 2 diabetes mellitus with hyperglycemia: Secondary | ICD-10-CM | POA: Diagnosis not present

## 2023-06-25 DIAGNOSIS — E538 Deficiency of other specified B group vitamins: Secondary | ICD-10-CM

## 2023-06-25 DIAGNOSIS — Z6841 Body Mass Index (BMI) 40.0 and over, adult: Secondary | ICD-10-CM

## 2023-06-25 MED ORDER — FLUCONAZOLE 100 MG PO TABS: 100.0000 mg | ORAL_TABLET | Freq: Every day | ORAL | 0 refills | Status: AC

## 2023-06-25 MED ORDER — LEVOTHYROXINE SODIUM 100 MCG PO TABS: ORAL_TABLET | ORAL | 0 refills | Status: DC

## 2023-06-25 MED ORDER — CYANOCOBALAMIN 1000 MCG/ML IJ SOLN
1000.0000 ug | Freq: Once | INTRAMUSCULAR | Status: AC
  Administered 2023-06-25: 1000 ug via INTRAMUSCULAR

## 2023-06-25 MED ORDER — MOUNJARO 5 MG/0.5ML ~~LOC~~ SOAJ: 5.0000 mg | SUBCUTANEOUS | 0 refills | Status: DC

## 2023-06-25 NOTE — Progress Notes (Signed)
 I,Victoria T Deloria Lair, CMA,acting as a Neurosurgeon for Gwynneth Aliment, MD.,have documented all relevant documentation on the behalf of Gwynneth Aliment, MD,as directed by  Gwynneth Aliment, MD while in the presence of Gwynneth Aliment, MD.  Subjective:  Patient ID: [name] Wright , female    DOB: 08-Feb-1966 , 58 y.o.   MRN: [id number]  Chief Complaint  Patient presents with   Diabetes   Hypertension   Hypothyroidism    HPI  Patient presents today for dm, bp & thyroid follow up. She reports compliance with medications. Denies headache, chest pain & sob.   While here today, she would like to discuss after given antibiotics for her procedure of removal of uterine polyps, she would like treatment for a yeast infection. She has spoken to GYN, but has yet to get relief of her sx.  She has used AZO yeast plus dual relief OTC.   She has started [facility], injection due today. This is week 2. She states this medication works better for her than Ozempic. She has noticed her BS have been running high, 300 plus. She admits the month of February she was inconsistent with taking medications.  She also took it upon herself to take her husbands glimepiride medication. She did this due to being out of DM medicine.   Letter sent to GYN for pap.    Diabetes She presents for her follow-up diabetic visit. She has type 2 diabetes mellitus. There are no hypoglycemic associated symptoms. Pertinent negatives for diabetes include no blurred vision, no polydipsia, no polyphagia and no polyuria. There are no hypoglycemic complications. Risk factors for coronary artery disease include diabetes mellitus, dyslipidemia, hypertension, sedentary lifestyle and obesity. An ACE inhibitor/angiotensin II receptor blocker is being taken. Eye exam is current.  Hypertension This is a chronic problem. The current episode started more than 1 month ago. The problem is unchanged. Pertinent negatives include no blurred vision, neck pain or  orthopnea. Compliance problems include exercise.      Past Medical History:  Diagnosis Date   Acute cystitis    Allergic rhinitis    Childhood asthma    no problems as adult - no inhaler   Chronic insomnia    Chronic pain due to trauma 02/2001   closed head trauma - Followed by Dr Linton [facility]   Complication of anesthesia    Dyspepsia    Family history of ovarian cancer    Head trauma 02/24/2001   closed   History of kidney stones    passed stone - no surgery required   Hypertension    Hypothyroidism    PONV (postoperative nausea and vomiting)    Pre-diabetes    Sleep apnea    Pt denies   SVD (spontaneous vaginal delivery)    fetal demise at 5 months     Family History  Problem Relation Age of Onset   Multiple sclerosis Mother 48   Bladder Cancer Mother 34   Heart Problems Father    Dementia Paternal Aunt    Ovarian cancer Maternal Grandmother    Lung cancer Maternal Grandfather    Tuberculosis Paternal Grandmother      Current Outpatient Medications:    acetaminophen (TYLENOL) 500 MG tablet, Take 1,000 mg by mouth every 6 (six) hours as needed for moderate pain (pain score 4-6)., Disp: , Rfl:    albuterol (PROAIR HFA) 108 (90 Base) MCG/ACT inhaler, Inhale 2 puffs into the lungs every 4 (four) hours as  needed for wheezing or shortness of breath., Disp: 1 each, Rfl: 1   baclofen (LIORESAL) 10 MG tablet, Take 10 mg by mouth 3 (three) times daily as needed for muscle spasms., Disp: , Rfl:    butalbital-acetaminophen-caffeine (FIORICET) 50-325-40 MG tablet, Take 1 tablet by mouth every 6 (six) hours as needed for headache., Disp: , Rfl:    cetirizine (ZYRTEC ALLERGY) 10 MG tablet, Take 1 tablet (10 mg total) by mouth daily. (Patient taking differently: Take 10 mg by mouth daily as needed for allergies.), Disp: 90 tablet, Rfl: 0   Continuous Glucose Sensor (FREESTYLE LIBRE 3 SENSOR) MISC, 1 applicator by Does not apply route as directed. Place 1  sensor on the skin every 14 days. Use to check glucose continuously, Disp: 3 each, Rfl: 2   cyanocobalamin (VITAMIN B12) 1000 MCG tablet, Take 1,000 mcg by mouth daily as needed (mouth ulcers)., Disp: , Rfl:    diclofenac (FLECTOR) 1.3 % PTCH, Place 1 patch onto the skin daily as needed (pain)., Disp: , Rfl:    diclofenac (VOLTAREN) 75 MG EC tablet, Take 75 mg by mouth 2 (two) times daily as needed for moderate pain (pain score 4-6)., Disp: , Rfl:    diclofenac Sodium (VOLTAREN) 1 % GEL, Apply 1 Application topically 4 (four) times daily as needed (pain)., Disp: , Rfl:    diphenhydrAMINE (BENADRYL) 25 mg capsule, Take 25 mg by mouth every 4 (four) hours as needed for allergies., Disp: , Rfl:    fluconazole (DIFLUCAN) 100 MG tablet, Take 1 tablet (100 mg total) by mouth daily., Disp: 7 tablet, Rfl: 0   fluticasone (FLONASE) 50 MCG/ACT nasal spray, Place 1 spray into both nostrils daily as needed for allergies., Disp: , Rfl:    furosemide (LASIX) 80 MG tablet, TAKE 1 TABLET BY MOUTH ONCE DAILY AS NEEDED FOR FLUID, Disp: 90 tablet, Rfl: 0   ibuprofen (ADVIL) 200 MG tablet, Take 600 mg by mouth every 6 (six) hours as needed for moderate pain (pain score 4-6)., Disp: , Rfl:    MAGNESIUM PO, Take 1 tablet by mouth daily as needed (cramping)., Disp: , Rfl:    meclizine (ANTIVERT) 25 MG tablet, Take 25 mg by mouth every 4 (four) hours as needed for dizziness (vertigo)., Disp: , Rfl:    methocarbamol (ROBAXIN) 500 MG tablet, Take 500 mg by mouth every 8 (eight) hours as needed for muscle spasms., Disp: , Rfl:    morphine (MSIR) 15 MG tablet, Take 15 mg by mouth 3 (three) times daily as needed for severe pain (pain score 7-10)., Disp: , Rfl:    promethazine (PHENERGAN) 25 MG tablet, TAKE 1/2 TO 1 (ONE-HALF TO ONE) TABLET BY MOUTH EVERY 8 HOURS AS NEEDED FOR NAUSEA (Patient taking differently: Take 25 mg by mouth every 4 (four) hours as needed for vomiting or nausea.), Disp: 40 tablet, Rfl: 0   RESTASIS 0.05 %  ophthalmic emulsion, Place 1 drop into both eyes at bedtime., Disp: , Rfl:    SUDOGEST MAXIMUM STRENGTH 30 MG tablet, TAKE 1 TABLET BY MOUTH TWICE DAILY AS NEEDED FOR CONGESTION (Patient taking differently: Take 30 mg by mouth every 4 (four) hours as needed for congestion.), Disp: 90 tablet, Rfl: 0   TURMERIC PO, Take 1,500 mg by mouth daily as needed (inflammation)., Disp: , Rfl:    Vitamin D, Ergocalciferol, (DRISDOL) 1.25 MG (50000 UNIT) CAPS capsule, Take 1 capsule (50,000 Units total) by mouth every 7 (seven) days., Disp: 5 capsule, Rfl: 2   EPINEPHrine  0.3 mg/0.3 mL IJ SOAJ injection, Inject 0.3 mg into the muscle See admin instructions., Disp: , Rfl:    levothyroxine (SYNTHROID) 100 MCG tablet, TAKE 1 TABLET BY MOUTH BEFORE BREAKFAST, Disp: 90 tablet, Rfl: 0   tirzepatide (MOUNJARO) 5 MG/0.5ML Pen, Inject 5 mg into the skin once a week., Disp: 2 mL, Rfl: 0   Allergies  Allergen Reactions   Doxycycline Anaphylaxis, Diarrhea and Nausea And Vomiting    headache   Empagliflozin     Other Reaction(s): Other (See Comments)  Yeast and urinary infection   Lysteda [Tranexamic Acid] Anaphylaxis    Tongue swelling, facial numbness, hand went numb. Felt like throat was closing    Megestrol Anaphylaxis    Tongue swelling, felt like throat was closing    Oxycodone-Acetaminophen Hives and Nausea And Vomiting    Can take plain tylenol   Prochlorperazine Edisylate Other (See Comments)    Hyper and jitteriness   Amoxicillin Nausea And Vomiting and Other (See Comments)   Cortisone     Flushed    Eszopiclone Other (See Comments)    Metallic taste and became un effective    Keppra [Levetiracetam] Other (See Comments)    hyperactive   Latex     Condoms cause vaginal irritation   Neurontin [Gabapentin] Nausea And Vomiting   Ozempic (0.25 Or 0.5 Mg-Dose) [Semaglutide(0.25 Or 0.5mg -Dos)] Diarrhea and Nausea And Vomiting    Bad taste in mouth    Propoxyphene Hives   Robitussin Dm Max Day-Night  Hives   Tape     PAPER TAPE-skin burns/severe irritation  PATIENT DOES NOT TOLERATE PAPER TAPE   Trileptal [Oxcarbazepine] Other (See Comments)    REACTION: immune system suppressed   Bactrim [Sulfamethoxazole-Trimethoprim] Rash   Bupropion Rash   Codeine Rash   Moxifloxacin Swelling and Rash   Vicodin [Hydrocodone-Acetaminophen] Rash     Review of Systems  Constitutional: Negative.   Eyes:  Negative for blurred vision.  Respiratory: Negative.    Cardiovascular: Negative.  Negative for orthopnea.  Gastrointestinal: Negative.   Endocrine: Negative for polydipsia, polyphagia and polyuria.  Genitourinary:  Positive for vaginal discharge.       Pos yeast infection  Musculoskeletal:  Negative for neck pain.  Neurological: Negative.   Psychiatric/Behavioral: Negative.       Today's Vitals   06/25/23 1126  BP: 128/82  Pulse: 99  Temp: 98.1 F (36.7 C)  SpO2: 98%  Weight: (!) 366 lb 12.8 oz (166.4 kg)  Height: 5\' 6"  (1.676 m)   Body mass index is 59.2 kg/m.  Wt Readings from Last 3 Encounters:  06/25/23 (!) 366 lb 12.8 oz (166.4 kg)  06/07/23 (!) 368 lb (166.9 kg)  05/09/23 (!) 381 lb (172.8 kg)     Objective:  Physical Exam Vitals and nursing note reviewed.  Constitutional:      Appearance: Normal appearance. She is obese.  HENT:     Head: Normocephalic and atraumatic.  Eyes:     Extraocular Movements: Extraocular movements intact.  Cardiovascular:     Rate and Rhythm: Normal rate and regular rhythm.     Heart sounds: Normal heart sounds.  Pulmonary:     Effort: Pulmonary effort is normal.     Breath sounds: Normal breath sounds.  Musculoskeletal:     Cervical back: Normal range of motion.  Skin:    General: Skin is warm.  Neurological:     General: No focal deficit present.     Mental Status: She is alert.  Psychiatric:  Mood and Affect: Mood normal.        Behavior: Behavior normal.         Assessment And Plan:  Uncontrolled type 2 diabetes  mellitus with hyperglycemia (HCC) Assessment & Plan: Chronic, has long history of non-compliance. Due to her recent dx of thrush/vaginitis - she is more committed to achieving optimal blood sugar control. She will continue with Mounjaro 5mg  weekly x 2 months per her request. I do plan to titrate the medication at that time. She was commended for incorporating more activity into her daily routine. She is encouraged to make needed dietary recommendations as well. She will f/u in May 2025.   Orders: -     CMP14+EGFR  Essential hypertension, benign Assessment & Plan: Chronic, fair control. Goal BP<120/80.  She was previously on losartan, she states this causes her to have headaches. She agrees to rto in 2 weeks for NV. If BP is above goal, I plan to start valsartan 80mg  daily.   Orders: -     CMP14+EGFR  Thrush, oral Assessment & Plan: I will send rx fluconazole 100mg  daily x 7 days. She will swish/spit dilute ACV. She will let me know if her sx persist.    Primary hypothyroidism Assessment & Plan: Chronic, currently taking levothyroxine daily. She will continue with current meds. I will check thyroid panel at her next visit. Refill sent as requested.   Vitamin B12 deficiency -     Vitamin B12 -     Cyanocobalamin  Class 3 severe obesity due to excess calories with body mass index (BMI) of 50.0 to 59.9 in adult, unspecified whether serious comorbidity present [facility]) Assessment & Plan: BMI 59.  She has lost 15 lbs since January 2025. She was congratulated on her weight loss thus far. She will c/w Mounjaro, encouraged to  aim for at least 150 minutes of exercise per week.    Other orders -     Mounjaro; Inject 5 mg into the skin once a week.  Dispense: 2 mL; Refill: 0 -     Fluconazole; Take 1 tablet (100 mg total) by mouth daily.  Dispense: 7 tablet; Refill: 0 -     Levothyroxine Sodium; TAKE 1 TABLET BY MOUTH BEFORE BREAKFAST  Dispense: 90 tablet; Refill: 0  She is encouraged  to strive for BMI less than 30 to decrease cardiac risk. Advised to aim for at least 150 minutes of exercise per week.    Return in 2 weeks (on 07/09/2023), or BP CHECK--nv, for mAY DM CHECK.  Patient was given opportunity to ask questions. Patient verbalized understanding of the plan and was able to repeat key elements of the plan. All questions were answered to their satisfaction.    I, Gwynneth Aliment, MD, have reviewed all documentation for this visit. The documentation on 06/25/23 for the exam, diagnosis, procedures, and orders are all accurate and complete.   IF YOU HAVE BEEN REFERRED TO A SPECIALIST, IT MAY TAKE 1-2 WEEKS TO SCHEDULE/PROCESS THE REFERRAL. IF YOU HAVE NOT HEARD FROM US/SPECIALIST IN TWO WEEKS, PLEASE GIVE [place] A CALL AT [phone].   THE PATIENT IS ENCOURAGED TO PRACTICE SOCIAL DISTANCING DUE TO THE COVID-19 PANDEMIC.

## 2023-06-25 NOTE — Patient Instructions (Addendum)
 lipedema  Type 2 Diabetes Mellitus, Diagnosis, Adult Type 2 diabetes (type 2 diabetes mellitus) is a long-term (chronic) disease. It may happen when there is one or both of these problems: The pancreas does not make enough insulin. The body does not react in a normal way to insulin that it makes. Insulin lets sugars go into cells in your body. If you have type 2 diabetes, sugars cannot get into your cells. Sugars build up in the blood. This causes high blood sugar. What are the causes? The exact cause of this condition is not known. What increases the risk? Having type 2 diabetes in your family. Being overweight or very overweight. Not being active. Your body not reacting in a normal way to the insulin it makes. Having higher than normal blood sugar over time. Having a type of diabetes when you were pregnant. Having a condition that causes small fluid-filled sacs on your ovaries. What are the signs or symptoms? At first, you may have no symptoms. You will get symptoms slowly. They may include: More thirst than normal. More hunger than normal. Needing to pee more than normal. Losing weight without trying. Feeling tired. Feeling weak. Seeing things blurry. Dark patches on your skin. How is this treated? This condition may be treated by a diabetes expert. You may need to: Follow an eating plan made by a food expert (dietitian). Get regular exercise. Find ways to deal with stress. Check blood sugar as often as told. Take medicines. Your doctor will set treatment goals for you. Your blood sugar should be at these levels: Before meals: 80-130 mg/dL (8.8-4.1 mmol/L). After meals: below 180 mg/dL (10 mmol/L). Over the last 2-3 months: less than 7%. Follow these instructions at home: Medicines Take your diabetes medicines or insulin every day. Take medicines as told to help you prevent other problems caused by this condition. You may need: Aspirin. Medicine to lower  cholesterol. Medicine to control blood pressure. Questions to ask your doctor Should I meet with a diabetes educator? What medicines do I need, and when should I take them? What will I need to treat my condition at home? When should I check my blood sugar? Where can I find a support group? Who can I call if I have questions? When is my next doctor visit? General instructions Take over-the-counter and prescription medicines only as told by your doctor. Keep all follow-up visits. Where to find more information For help and guidance and more information about diabetes, please go to: American Diabetes Association (ADA): www.diabetes.org American Association of Diabetes Care and Education Specialists (ADCES): www.diabeteseducator.org International Diabetes Federation (IDF): DCOnly.dk Contact a doctor if: Your blood sugar is at or above 240 mg/dL (66.0 mmol/L) for 2 days in a row. You have been sick for 2 days or more, and you are not getting better. You have had a fever for 2 days or more, and you are not getting better. You have any of these problems for more than 6 hours: You cannot eat or drink. You feel like you may vomit. You vomit. You have watery poop (diarrhea). Get help right away if: Your blood sugar is lower than 54 mg/dL (3 mmol/L). You feel mixed up (confused). You have trouble thinking clearly. You have trouble breathing. You have medium or large ketone levels in your pee. These symptoms may be an emergency. Get help right away. Call your local emergency services (911 in the [place] Do not wait to see if the symptoms will go away. Do not  drive yourself to the hospital. Summary Type 2 diabetes is a long-term disease. Your pancreas may not make enough insulin, or your body may not react in a normal way to insulin that it makes. This condition is treated with an eating plan, lifestyle changes, and medicines. Your doctor will set treatment goals for you. These will help  you keep your blood sugar in a healthy range. Keep all follow-up visits. This information is not intended to replace advice given to you by your health care provider. Make sure you discuss any questions you have with your health care provider. Document Revised: 06/20/2020 Document Reviewed: 06/20/2020 Elsevier Patient Education  2024 [employer].

## 2023-06-26 ENCOUNTER — Encounter: Payer: Self-pay | Admitting: Obstetrics and Gynecology

## 2023-06-26 LAB — CMP14+EGFR
ALT: 23 IU/L (ref 0–32)
AST: 26 IU/L (ref 0–40)
Albumin: 4.3 g/dL (ref 3.8–4.9)
Alkaline Phosphatase: 118 IU/L (ref 44–121)
BUN/Creatinine Ratio: 17 (ref 9–23)
BUN: 14 mg/dL (ref 6–24)
Bilirubin Total: 0.4 mg/dL (ref 0.0–1.2)
CO2: 22 mmol/L (ref 20–29)
Calcium: 9.4 mg/dL (ref 8.7–10.2)
Chloride: 100 mmol/L (ref 96–106)
Creatinine, Ser: 0.81 mg/dL (ref 0.57–1.00)
Globulin, Total: 3.2 g/dL (ref 1.5–4.5)
Glucose: 150 mg/dL — ABNORMAL HIGH (ref 70–99)
Potassium: 4.5 mmol/L (ref 3.5–5.2)
Sodium: 138 mmol/L (ref 134–144)
Total Protein: 7.5 g/dL (ref 6.0–8.5)
eGFR: 85 mL/min/{1.73_m2} (ref >59)

## 2023-06-26 LAB — VITAMIN B12: Vitamin B-12: 439 pg/mL (ref 232–1245)

## 2023-06-27 ENCOUNTER — Telehealth: Payer: Self-pay

## 2023-06-27 NOTE — Telephone Encounter (Signed)
 Patient was identified as falling into the [place] Measure - Diabetes.   Patient was: Appointment scheduled for lab or office visit for A1c.

## 2023-07-07 DIAGNOSIS — B370 Candidal stomatitis: Secondary | ICD-10-CM | POA: Insufficient documentation

## 2023-07-07 NOTE — Assessment & Plan Note (Signed)
 Chronic, currently taking levothyroxine daily. She will continue with current meds. I will check thyroid panel at her next visit. Refill sent as requested.

## 2023-07-07 NOTE — Assessment & Plan Note (Signed)
 I will send rx fluconazole 100mg  daily x 7 days. She will swish/spit dilute ACV. She will let me know if her sx persist.

## 2023-07-07 NOTE — Assessment & Plan Note (Signed)
 Chronic, fair control. Goal BP<120/80.  She was previously on losartan, she states this causes her to have headaches. She agrees to rto in 2 weeks for NV. If BP is above goal, I plan to start valsartan 80mg  daily.

## 2023-07-07 NOTE — Assessment & Plan Note (Signed)
 BMI 59.  She has lost 15 lbs since January 2025. She was congratulated on her weight loss thus far. She will c/w Mounjaro, encouraged to  aim for at least 150 minutes of exercise per week.

## 2023-07-07 NOTE — Assessment & Plan Note (Signed)
 Chronic, has long history of non-compliance. Due to her recent dx of thrush/vaginitis - she is more committed to achieving optimal blood sugar control. She will continue with [facility] 5mg  weekly x 2 months per her request. I do plan to titrate the medication at that time. She was commended for incorporating more activity into her daily routine. She is encouraged to make needed dietary recommendations as well. She will f/u in May 2025.

## 2023-07-09 ENCOUNTER — Other Ambulatory Visit: Payer: Self-pay

## 2023-07-09 ENCOUNTER — Ambulatory Visit

## 2023-07-09 VITALS — BP 132/88 | Temp 98.1°F | Ht 66.0 in | Wt 366.0 lb

## 2023-07-09 DIAGNOSIS — I10 Essential (primary) hypertension: Secondary | ICD-10-CM

## 2023-07-09 DIAGNOSIS — Z79899 Other long term (current) drug therapy: Secondary | ICD-10-CM

## 2023-07-09 MED ORDER — VALSARTAN 80 MG PO TABS: ORAL_TABLET | ORAL | 1 refills | Status: DC

## 2023-07-09 NOTE — Progress Notes (Signed)
 She presents today for bpc. She currently does not take any prescribed medications for bp. She drinks 4 bottles of water daily.  BP Readings from Last 3 Encounters:  07/09/23 132/88  06/25/23 128/82  06/07/23 (!) 162/93  Per provider patient is to start Valsartan 80MG  once daily with dinner. Prescription sent to pharmacy. Patient aware. Patient also scheduled to come back in 2 weeks for bpc & bmp lab test.

## 2023-07-10 ENCOUNTER — Encounter: Payer: Self-pay | Admitting: Internal Medicine

## 2023-07-11 ENCOUNTER — Other Ambulatory Visit: Payer: Self-pay | Admitting: Internal Medicine

## 2023-07-11 DIAGNOSIS — T7840XD Allergy, unspecified, subsequent encounter: Secondary | ICD-10-CM

## 2023-07-11 DIAGNOSIS — I10 Essential (primary) hypertension: Secondary | ICD-10-CM

## 2023-07-17 DIAGNOSIS — M1711 Unilateral primary osteoarthritis, right knee: Secondary | ICD-10-CM | POA: Diagnosis not present

## 2023-07-17 DIAGNOSIS — M25551 Pain in right hip: Secondary | ICD-10-CM | POA: Diagnosis not present

## 2023-07-23 ENCOUNTER — Ambulatory Visit

## 2023-07-24 ENCOUNTER — Other Ambulatory Visit: Payer: Self-pay | Admitting: Internal Medicine

## 2023-07-24 ENCOUNTER — Encounter: Payer: Self-pay | Admitting: Internal Medicine

## 2023-07-24 MED ORDER — MOUNJARO 7.5 MG/0.5ML ~~LOC~~ SOAJ: 7.5000 mg | SUBCUTANEOUS | 1 refills | Status: DC

## 2023-07-31 DIAGNOSIS — J301 Allergic rhinitis due to pollen: Secondary | ICD-10-CM | POA: Diagnosis not present

## 2023-07-31 DIAGNOSIS — J4530 Mild persistent asthma, uncomplicated: Secondary | ICD-10-CM | POA: Diagnosis not present

## 2023-07-31 DIAGNOSIS — J3089 Other allergic rhinitis: Secondary | ICD-10-CM | POA: Diagnosis not present

## 2023-07-31 DIAGNOSIS — J3081 Allergic rhinitis due to animal (cat) (dog) hair and dander: Secondary | ICD-10-CM | POA: Diagnosis not present

## 2023-08-05 DIAGNOSIS — Z5181 Encounter for therapeutic drug level monitoring: Secondary | ICD-10-CM | POA: Diagnosis not present

## 2023-08-05 DIAGNOSIS — M7918 Myalgia, other site: Secondary | ICD-10-CM | POA: Diagnosis not present

## 2023-08-05 DIAGNOSIS — R202 Paresthesia of skin: Secondary | ICD-10-CM | POA: Diagnosis not present

## 2023-08-05 DIAGNOSIS — M25561 Pain in right knee: Secondary | ICD-10-CM | POA: Diagnosis not present

## 2023-08-05 DIAGNOSIS — M542 Cervicalgia: Secondary | ICD-10-CM | POA: Diagnosis not present

## 2023-08-05 DIAGNOSIS — R519 Headache, unspecified: Secondary | ICD-10-CM | POA: Diagnosis not present

## 2023-08-05 DIAGNOSIS — G894 Chronic pain syndrome: Secondary | ICD-10-CM | POA: Diagnosis not present

## 2023-08-05 DIAGNOSIS — M25562 Pain in left knee: Secondary | ICD-10-CM | POA: Diagnosis not present

## 2023-08-05 DIAGNOSIS — Z79891 Long term (current) use of opiate analgesic: Secondary | ICD-10-CM | POA: Diagnosis not present

## 2023-08-08 ENCOUNTER — Encounter: Payer: Self-pay | Admitting: Internal Medicine

## 2023-08-08 ENCOUNTER — Ambulatory Visit: Payer: Self-pay | Admitting: Internal Medicine

## 2023-08-08 VITALS — BP 134/92 | HR 94 | Temp 98.1°F | Ht 66.0 in | Wt 365.6 lb

## 2023-08-08 DIAGNOSIS — I10 Essential (primary) hypertension: Secondary | ICD-10-CM

## 2023-08-08 DIAGNOSIS — M25561 Pain in right knee: Secondary | ICD-10-CM | POA: Diagnosis not present

## 2023-08-08 DIAGNOSIS — E66813 Obesity, class 3: Secondary | ICD-10-CM

## 2023-08-08 DIAGNOSIS — E1165 Type 2 diabetes mellitus with hyperglycemia: Secondary | ICD-10-CM

## 2023-08-08 DIAGNOSIS — G8929 Other chronic pain: Secondary | ICD-10-CM

## 2023-08-08 DIAGNOSIS — E039 Hypothyroidism, unspecified: Secondary | ICD-10-CM

## 2023-08-08 DIAGNOSIS — M25562 Pain in left knee: Secondary | ICD-10-CM

## 2023-08-08 DIAGNOSIS — Z6841 Body Mass Index (BMI) 40.0 and over, adult: Secondary | ICD-10-CM

## 2023-08-08 MED ORDER — FREESTYLE LIBRE 3 PLUS SENSOR MISC: 3 refills | Status: DC

## 2023-08-08 NOTE — Progress Notes (Signed)
 I,[name] Wright, CMA,acting as a Neurosurgeon for Smiley Dung, MD.,have documented all relevant documentation on the behalf of Smiley Dung, MD,as directed by  Smiley Dung, MD while in the presence of Smiley Dung, MD.  Subjective:  Patient ID: [name] Wright , female    DOB: 04-27-1965 , 58 y.o.   MRN: [id number]  Chief Complaint  Patient presents with   Diabetes    Patient presents today for [facility] follow up. She reports compliance with medications. Denies headache, chest pain & sob.  While here she reports taking only one dose of Valsartan . Due to experiencing migraine, headache & diarrhea.     HPI Discussed the use of AI scribe software for clinical note transcription with the patient, who gave verbal consent to proceed.  History of Present Illness ZEYDA Wright "[name] Wright" is a 59 year old female with hypertension and diabetes who presents for a follow-up on Mounjaro and blood pressure management.  After increasing her Mounjaro dosage to 7.5 mg, she initially experienced headaches after the first two doses, but these have since resolved. No gastrointestinal symptoms such as diarrhea or vomiting, although she mentions occasional burping. Keeping something in her stomach helps manage these symptoms.  She takes magnesium twice daily, which she finds beneficial for her leg cramps and bowel regularity. Prior to starting these medications, she had regular bowel movements twice a day, but now sometimes experiences constipation. She also takes turmeric for inflammation and has noticed improvement in her symptoms.  Her sleep is disrupted due to leg cramps, which wake her up at night. She tries to take her magnesium doses 12 hours apart to aid in symptom management. She is also focusing on increasing her protein intake and has noticed that eating out less frequently helps reduce bloating and puffiness.  She has been taking Lasix  (furosemide ) 40 mg twice a week, which she  finds helps manage her blood pressure. She has experimented with taking a quarter of the pill daily but did not notice any significant changes.  She is engaging in physical therapy, including swim therapy, to help with muscle challenges, particularly behind her knees. She mentions a fall in the past and is working on improving her mobility. She has also set up an above-ground pool at home to facilitate water exercises.  She is monitoring her blood glucose levels and aims to keep her morning readings below 120 mg/dL. Her current readings are around 125 mg/dL. She is also trying to increase her water intake to four 8-ounce bottles a day.   Diabetes She presents for her follow-up diabetic visit. She has type 2 diabetes mellitus. There are no hypoglycemic associated symptoms. Pertinent negatives for diabetes include no blurred vision, no polydipsia, no polyphagia and no polyuria. There are no hypoglycemic complications. Risk factors for coronary artery disease include diabetes mellitus, dyslipidemia, hypertension, sedentary lifestyle and obesity. An ACE inhibitor/angiotensin II receptor blocker is being taken. Eye exam is current.  Hypertension This is a chronic problem. The current episode started more than 1 month ago. The problem is unchanged. Pertinent negatives include no blurred vision, neck pain or orthopnea. Compliance problems include exercise.      Past Medical History:  Diagnosis Date   Acute cystitis    Allergic rhinitis    Childhood asthma    no problems as adult - no inhaler   Chronic insomnia    Chronic pain due to trauma 02/2001   closed head trauma - Followed by Dr Edgar Goods  [facility]   Complication of anesthesia    Dyspepsia    Family history of ovarian cancer    Head trauma 02/24/2001   closed   History of kidney stones    passed stone - no surgery required   Hypertension    Hypothyroidism    PONV (postoperative nausea and vomiting)    Pre-diabetes     Sleep apnea    Pt denies   SVD (spontaneous vaginal delivery)    fetal demise at 5 months     Family History  Problem Relation Age of Onset   Multiple sclerosis Mother 61   Bladder Cancer Mother 66   Heart Problems Father    Dementia Paternal Aunt    Ovarian cancer Maternal Grandmother    Lung cancer Maternal Grandfather    Tuberculosis Paternal Grandmother      Current Outpatient Medications:    acetaminophen  (TYLENOL ) 500 MG tablet, Take 1,000 mg by mouth every 6 (six) hours as needed for moderate pain (pain score 4-6)., Disp: , Rfl:    albuterol  (PROAIR  HFA) 108 (90 Base) MCG/ACT inhaler, Inhale 2 puffs into the lungs every 4 (four) hours as needed for wheezing or shortness of breath., Disp: 1 each, Rfl: 1   baclofen (LIORESAL) 10 MG tablet, Take 10 mg by mouth 3 (three) times daily as needed for muscle spasms., Disp: , Rfl:    butalbital-acetaminophen -caffeine (FIORICET) 50-325-40 MG tablet, Take 1 tablet by mouth every 6 (six) hours as needed for headache., Disp: , Rfl:    cetirizine  (ZYRTEC  ALLERGY) 10 MG tablet, Take 1 tablet (10 mg total) by mouth daily. (Patient taking differently: Take 10 mg by mouth daily as needed for allergies.), Disp: 90 tablet, Rfl: 0   Continuous Glucose Sensor (FREESTYLE LIBRE 3 PLUS SENSOR) MISC, Use as directed to check blood sugars once daily., Disp: 3 each, Rfl: 3   cyanocobalamin  (VITAMIN B12) 1000 MCG tablet, Take 1,000 mcg by mouth daily as needed (mouth ulcers)., Disp: , Rfl:    diclofenac (FLECTOR) 1.3 % PTCH, Place 1 patch onto the skin daily as needed (pain)., Disp: , Rfl:    diclofenac (VOLTAREN) 75 MG EC tablet, Take 75 mg by mouth 2 (two) times daily as needed for moderate pain (pain score 4-6)., Disp: , Rfl:    diclofenac Sodium (VOLTAREN) 1 % GEL, Apply 1 Application topically 4 (four) times daily as needed (pain)., Disp: , Rfl:    diphenhydrAMINE  (BENADRYL ) 25 mg capsule, Take 25 mg by mouth every 4 (four) hours as needed for  allergies., Disp: , Rfl:    EPINEPHrine 0.3 mg/0.3 mL IJ SOAJ injection, Inject 0.3 mg into the muscle See admin instructions., Disp: , Rfl:    fluticasone  (FLONASE) 50 MCG/ACT nasal spray, Place 1 spray into both nostrils daily as needed for allergies., Disp: , Rfl:    furosemide  (LASIX ) 80 MG tablet, TAKE 1 TABLET BY MOUTH ONCE DAILY AS NEEDED FOR FLUID, Disp: 90 tablet, Rfl: 0   ibuprofen  (ADVIL ) 200 MG tablet, Take 600 mg by mouth every 6 (six) hours as needed for moderate pain (pain score 4-6)., Disp: , Rfl:    levothyroxine  (SYNTHROID ) 100 MCG tablet, TAKE 1 TABLET BY MOUTH BEFORE BREAKFAST, Disp: 90 tablet, Rfl: 0   MAGNESIUM PO, Take 1 tablet by mouth daily as needed (cramping)., Disp: , Rfl:    meclizine (ANTIVERT) 25 MG tablet, Take 25 mg by mouth every 4 (four) hours as needed for dizziness (vertigo)., Disp: , Rfl:  methocarbamol (ROBAXIN) 500 MG tablet, Take 500 mg by mouth every 8 (eight) hours as needed for muscle spasms., Disp: , Rfl:    morphine  (MSIR) 15 MG tablet, Take 15 mg by mouth 3 (three) times daily as needed for severe pain (pain score 7-10)., Disp: , Rfl:    promethazine  (PHENERGAN ) 25 MG tablet, TAKE 1/2 TO 1 (ONE-HALF TO ONE) TABLET BY MOUTH EVERY 8 HOURS AS NEEDED FOR NAUSEA (Patient taking differently: Take 25 mg by mouth every 4 (four) hours as needed for vomiting or nausea.), Disp: 40 tablet, Rfl: 0   RESTASIS 0.05 % ophthalmic emulsion, Place 1 drop into both eyes at bedtime., Disp: , Rfl:    SUDOGEST MAXIMUM STRENGTH 30 MG tablet, TAKE 1 TABLET BY MOUTH TWICE DAILY AS NEEDED FOR CONGESTION (Patient taking differently: Take 30 mg by mouth every 4 (four) hours as needed for congestion.), Disp: 90 tablet, Rfl: 0   tirzepatide (MOUNJARO) 7.5 MG/0.5ML Pen, Inject 7.5 mg into the skin once a week., Disp: 2 mL, Rfl: 1   TURMERIC PO, Take 1,500 mg by mouth daily as needed (inflammation)., Disp: , Rfl:    Vitamin D , Ergocalciferol , (DRISDOL ) 1.25 MG (50000 UNIT) CAPS  capsule, Take 1 capsule (50,000 Units total) by mouth every 7 (seven) days., Disp: 5 capsule, Rfl: 2   Allergies  Allergen Reactions   Doxycycline  Anaphylaxis, Diarrhea and Nausea And Vomiting    headache   Empagliflozin      Other Reaction(s): Other (See Comments)  Yeast and urinary infection   Lysteda [Tranexamic Acid] Anaphylaxis    Tongue swelling, facial numbness, hand went numb. Felt like throat was closing    Megestrol Anaphylaxis    Tongue swelling, felt like throat was closing    Oxycodone -Acetaminophen  Hives and Nausea And Vomiting    Can take plain tylenol    Prochlorperazine Edisylate Other (See Comments)    Hyper and jitteriness   Amoxicillin  Nausea And Vomiting and Other (See Comments)   Cortisone     Flushed    Eszopiclone Other (See Comments)    Metallic taste and became un effective    Keppra [Levetiracetam] Other (See Comments)    hyperactive   Latex     Condoms cause vaginal irritation   Neurontin  [Gabapentin ] Nausea And Vomiting   Ozempic  (0.25 Or 0.5 Mg-Dose) [Semaglutide (0.25 Or 0.5mg -Dos)] Diarrhea and Nausea And Vomiting    Bad taste in mouth    Propoxyphene Hives   Robitussin Dm Max Day-Night Hives   Tape     PAPER TAPE-skin burns/severe irritation  PATIENT DOES NOT TOLERATE PAPER TAPE   Trileptal [Oxcarbazepine] Other (See Comments)    REACTION: immune system suppressed   Bactrim  [Sulfamethoxazole -Trimethoprim ] Rash   Bupropion Rash   Codeine Rash   Moxifloxacin Swelling and Rash   Vicodin [Hydrocodone-Acetaminophen ] Rash     Review of Systems  Constitutional: Negative.   Eyes:  Negative for blurred vision.  Respiratory: Negative.    Cardiovascular: Negative.  Negative for orthopnea.  Gastrointestinal: Negative.   Endocrine: Negative for polydipsia, polyphagia and polyuria.  Musculoskeletal:  Positive for arthralgias. Negative for neck pain.  Neurological: Negative.   Psychiatric/Behavioral: Negative.       Today's Vitals   08/08/23  1437 08/08/23 1452  BP: (!) 142/100 (!) 134/92  Pulse: 94   Temp: 98.1 F (36.7 C)   SpO2: 98%   Weight: (!) 365 lb 9.6 oz (165.8 kg)   Height: 5\' 6"  (1.676 m)    Body mass index is 59.01 kg/m.  Wt Readings from Last 3 Encounters:  08/08/23 (!) 365 lb 9.6 oz (165.8 kg)  07/09/23 (!) 366 lb (166 kg)  06/25/23 (!) 366 lb 12.8 oz (166.4 kg)     Objective:  Physical Exam Vitals and nursing note reviewed.  Constitutional:      Appearance: Normal appearance. She is obese.  HENT:     Head: Normocephalic and atraumatic.  Eyes:     Extraocular Movements: Extraocular movements intact.  Cardiovascular:     Rate and Rhythm: Normal rate and regular rhythm.     Heart sounds: Normal heart sounds.  Pulmonary:     Effort: Pulmonary effort is normal.     Breath sounds: Normal breath sounds.  Musculoskeletal:     Cervical back: Normal range of motion.  Skin:    General: Skin is warm.  Neurological:     General: No focal deficit present.     Mental Status: She is alert.  Psychiatric:        Mood and Affect: Mood normal.        Behavior: Behavior normal.         Assessment And Plan:  Uncontrolled type 2 diabetes mellitus with hyperglycemia (HCC) Assessment & Plan: Chronic, currently managed with Mounjaro, improved glucose levels, no GI side effects. Morning glucose ~125 mg/dL, target <725 mg/dL. - Continue Mounjaro 7.5 mg weekly - Encourage water aerobics and postprandial walks. - Check A1c. - Ensure Libre data access.  Orders: -     FreeStyle Libre 3 Plus Sensor; Use as directed to check blood sugars once daily.  Dispense: 3 each; Refill: 3 -     Hemoglobin A1c -     BMP8+eGFR  Essential hypertension, benign Assessment & Plan: Chronic, uncontrolled.  She has been intolerant of two ARBs - losartan  and valsartan .  She has been referred to HTN clinic. Currently taking furosemide  once weekly.  She is reminded to follow low sodium diet.   - Adjust Lasix  to 40 mg on Monday,  Wednesday, and Friday dosing   Primary hypothyroidism Assessment & Plan: Chronic, currently taking levothyroxine  100mcg daily. She will continue with current meds. I will check labs as below and adjust meds as needed.   Orders: -     TSH  Chronic pain of both knees Assessment & Plan: Chronic, sx due to osteoarthritis.  She is encouraged to follow an anti-inflammatory diet. She would likely benefit from water aerobics.    Class 3 severe obesity due to excess calories with body mass index (BMI) of 50.0 to 59.9 in adult, unspecified whether serious comorbidity present Assessment & Plan: BMI 59.  Managed with lifestyle changes and Mounjaro, resulting in weight loss. - Encourage dietary modifications and portion control. - Encourage physical activity, including water aerobics and postprandial walks.    Return in about 3 months (around 11/08/2023) for dm check.  Patient was given opportunity to ask questions. Patient verbalized understanding of the plan and was able to repeat key elements of the plan. All questions were answered to their satisfaction.    I, Smiley Dung, MD, have reviewed all documentation for this visit. The documentation on 08/08/23 for the exam, diagnosis, procedures, and orders are all accurate and complete.   IF YOU HAVE BEEN REFERRED TO A SPECIALIST, IT MAY TAKE 1-2 WEEKS TO SCHEDULE/PROCESS THE REFERRAL. IF YOU HAVE NOT HEARD FROM US /SPECIALIST IN TWO WEEKS, PLEASE GIVE US  A CALL AT [phone].   THE PATIENT IS ENCOURAGED TO PRACTICE SOCIAL DISTANCING DUE  TO THE COVID-19 PANDEMIC.

## 2023-08-08 NOTE — Patient Instructions (Signed)

## 2023-08-09 LAB — TSH: TSH: 3.09 u[IU]/mL (ref 0.450–4.500)

## 2023-08-09 LAB — BMP8+EGFR
BUN/Creatinine Ratio: 16 (ref 9–23)
BUN: 11 mg/dL (ref 6–24)
CO2: 23 mmol/L (ref 20–29)
Calcium: 9.2 mg/dL (ref 8.7–10.2)
Chloride: 101 mmol/L (ref 96–106)
Creatinine, Ser: 0.7 mg/dL (ref 0.57–1.00)
Glucose: 116 mg/dL — ABNORMAL HIGH (ref 70–99)
Potassium: 4.2 mmol/L (ref 3.5–5.2)
Sodium: 140 mmol/L (ref 134–144)
eGFR: 101 mL/min/{1.73_m2} (ref >59)

## 2023-08-09 LAB — HEMOGLOBIN A1C
Est. average glucose Bld gHb Est-mCnc: 174 mg/dL
Hgb A1c MFr Bld: 7.7 % — ABNORMAL HIGH (ref 4.8–5.6)

## 2023-08-10 ENCOUNTER — Encounter: Payer: Self-pay | Admitting: Internal Medicine

## 2023-08-17 DIAGNOSIS — G8929 Other chronic pain: Secondary | ICD-10-CM | POA: Insufficient documentation

## 2023-08-17 NOTE — Assessment & Plan Note (Addendum)
 Chronic, currently taking levothyroxine  100mcg daily. She will continue with current meds. I will check labs as below and adjust meds as needed.

## 2023-08-17 NOTE — Assessment & Plan Note (Addendum)
 Chronic, uncontrolled.  She has been intolerant of two ARBs - losartan  and valsartan .  She has been referred to HTN clinic. Currently taking furosemide  once weekly.  She is reminded to follow low sodium diet.   - Adjust Lasix  to 40 mg on Monday, Wednesday, and Friday dosing

## 2023-08-17 NOTE — Assessment & Plan Note (Signed)
 Chronic, sx due to osteoarthritis.  She is encouraged to follow an anti-inflammatory diet. She would likely benefit from water aerobics.

## 2023-08-17 NOTE — Assessment & Plan Note (Signed)
 Chronic, currently managed with Mounjaro, improved glucose levels, no GI side effects. Morning glucose ~125 mg/dL, target <161 mg/dL. - Continue Mounjaro 7.5 mg weekly - Encourage water aerobics and postprandial walks. - Check A1c. - Ensure Libre data access.

## 2023-08-17 NOTE — Assessment & Plan Note (Signed)
 BMI 59.  Managed with lifestyle changes and Mounjaro, resulting in weight loss. - Encourage dietary modifications and portion control. - Encourage physical activity, including water aerobics and postprandial walks.

## 2023-08-19 ENCOUNTER — Ambulatory Visit (INDEPENDENT_AMBULATORY_CARE_PROVIDER_SITE_OTHER): Admitting: Internal Medicine

## 2023-08-19 ENCOUNTER — Encounter: Payer: Self-pay | Admitting: Internal Medicine

## 2023-08-19 VITALS — BP 150/100 | HR 94 | Temp 98.8°F | Ht 66.0 in | Wt 365.0 lb

## 2023-08-19 DIAGNOSIS — R42 Dizziness and giddiness: Secondary | ICD-10-CM | POA: Diagnosis not present

## 2023-08-19 DIAGNOSIS — I10 Essential (primary) hypertension: Secondary | ICD-10-CM

## 2023-08-19 DIAGNOSIS — H60331 Swimmer's ear, right ear: Secondary | ICD-10-CM

## 2023-08-19 DIAGNOSIS — H60339 Swimmer's ear, unspecified ear: Secondary | ICD-10-CM | POA: Insufficient documentation

## 2023-08-19 DIAGNOSIS — R21 Rash and other nonspecific skin eruption: Secondary | ICD-10-CM | POA: Insufficient documentation

## 2023-08-19 DIAGNOSIS — N760 Acute vaginitis: Secondary | ICD-10-CM

## 2023-08-19 MED ORDER — FLUCONAZOLE 150 MG PO TABS: ORAL_TABLET | ORAL | 0 refills | Status: AC

## 2023-08-19 MED ORDER — AZITHROMYCIN 250 MG PO TABS: ORAL_TABLET | ORAL | 0 refills | Status: AC

## 2023-08-19 NOTE — Assessment & Plan Note (Signed)
 Chronic, uncontrolled. Previously intolerant of both losartan  and valsartan  Blood pressure 150/100 mmHg. Current regimen followed. - Continue furosemide  dosing Monday, Wednesday, and Friday. - May need daily dosing since this is only  medication she has tolerated thus far ?? Compliance - Keep upcoming appt with HTN clinic

## 2023-08-19 NOTE — Progress Notes (Signed)
 I,[name], CMA,acting as a Neurosurgeon for Smiley Dung, MD.,have documented all relevant documentation on the behalf of Smiley Dung, MD,as directed by  Smiley Dung, MD while in the presence of Smiley Dung, MD.  Subjective:  Patient ID: [name] Wright , female    DOB: 09-01-1965 , 58 y.o.   MRN: [id number]  Chief Complaint  Patient presents with   Otalgia    Patient presents today for what she believes is a possible ear infection. She experiences swollen ears with pain. She visited allergist 2 weeks ago. Allergist gave her ear drops. He told her it could possibly be swimmers ear. Since visit with him she states symptoms have not improved. She also experiences drainage.  She has not yet completed mammogram.     HPI Discussed the use of AI scribe software for clinical note transcription with the patient, who gave verbal consent to proceed.  History of Present Illness [name] Wright "[name]" is a 58 year old female who presents with ear pain and dizziness.  She has been experiencing ear pain since last Wednesday, described as an ache with pressure and occasional sharp pain. There is swelling and tenderness around the ear and jaw. Despite using prescribed ear drops for five days, there has been no improvement.  Dizziness began concurrently with the ear pain, described as a sensation of being off balance, similar to being on a slant or a ship. This has affected her ability to drive. She has a history of vertigo following a head injury and manages it with meclizine, which she receives from her pain specialist. No new prescription for meclizine is required at this time.  She has a history of intolerance to most antibiotics, experiencing nausea and vomiting with amoxicillin . She tolerates Levaquin  well and has previously taken azithromycin  and Keflex , though she does not recall her tolerance to these medications.  No trouble swallowing. Reports swelling and tenderness in  the jaw area. Denies recent water exposure despite being diagnosed with swimmer's ear by her allergist. Reports dizziness and vertigo.  Past Medical History:  Diagnosis Date   Acute cystitis    Allergic rhinitis    Childhood asthma    no problems as adult - no inhaler   Chronic insomnia    Chronic pain due to trauma 02/2001   closed head trauma - Followed by Dr Edgar Goods [facility]   Complication of anesthesia    Dyspepsia    Family history of ovarian cancer    Head trauma 02/24/2001   closed   History of kidney stones    passed stone - no surgery required   Hypertension    Hypothyroidism    PONV (postoperative nausea and vomiting)    Pre-diabetes    Sleep apnea    Pt denies   SVD (spontaneous vaginal delivery)    fetal demise at 5 months     Family History  Problem Relation Age of Onset   Multiple sclerosis Mother 31   Bladder Cancer Mother 59   Heart Problems Father    Dementia Paternal Aunt    Ovarian cancer Maternal Grandmother    Lung cancer Maternal Grandfather    Tuberculosis Paternal Grandmother      Current Outpatient Medications:    acetaminophen  (TYLENOL ) 500 MG tablet, Take 1,000 mg by mouth every 6 (six) hours as needed for moderate pain (pain score 4-6)., Disp: , Rfl:    albuterol  (PROAIR  HFA) 108 (90 Base) MCG/ACT inhaler,  Inhale 2 puffs into the lungs every 4 (four) hours as needed for wheezing or shortness of breath., Disp: 1 each, Rfl: 1   baclofen (LIORESAL) 10 MG tablet, Take 10 mg by mouth 3 (three) times daily as needed for muscle spasms., Disp: , Rfl:    butalbital-acetaminophen -caffeine (FIORICET) 50-325-40 MG tablet, Take 1 tablet by mouth every 6 (six) hours as needed for headache., Disp: , Rfl:    cetirizine  (ZYRTEC  ALLERGY) 10 MG tablet, Take 1 tablet (10 mg total) by mouth daily. (Patient taking differently: Take 10 mg by mouth daily as needed for allergies.), Disp: 90 tablet, Rfl: 0   Continuous Glucose Sensor (FREESTYLE  LIBRE 3 PLUS SENSOR) MISC, Use as directed to check blood sugars once daily., Disp: 3 each, Rfl: 3   cyanocobalamin  (VITAMIN B12) 1000 MCG tablet, Take 1,000 mcg by mouth daily as needed (mouth ulcers)., Disp: , Rfl:    diclofenac (FLECTOR) 1.3 % PTCH, Place 1 patch onto the skin daily as needed (pain)., Disp: , Rfl:    diclofenac (VOLTAREN) 75 MG EC tablet, Take 75 mg by mouth 2 (two) times daily as needed for moderate pain (pain score 4-6)., Disp: , Rfl:    diclofenac Sodium (VOLTAREN) 1 % GEL, Apply 1 Application topically 4 (four) times daily as needed (pain)., Disp: , Rfl:    diphenhydrAMINE  (BENADRYL ) 25 mg capsule, Take 25 mg by mouth every 4 (four) hours as needed for allergies., Disp: , Rfl:    EPINEPHrine 0.3 mg/0.3 mL IJ SOAJ injection, Inject 0.3 mg into the muscle See admin instructions., Disp: , Rfl:    fluticasone  (FLONASE) 50 MCG/ACT nasal spray, Place 1 spray into both nostrils daily as needed for allergies., Disp: , Rfl:    furosemide  (LASIX ) 80 MG tablet, TAKE 1 TABLET BY MOUTH ONCE DAILY AS NEEDED FOR FLUID, Disp: 90 tablet, Rfl: 0   ibuprofen  (ADVIL ) 200 MG tablet, Take 600 mg by mouth every 6 (six) hours as needed for moderate pain (pain score 4-6)., Disp: , Rfl:    levothyroxine  (SYNTHROID ) 100 MCG tablet, TAKE 1 TABLET BY MOUTH BEFORE BREAKFAST, Disp: 90 tablet, Rfl: 0   MAGNESIUM PO, Take 1 tablet by mouth daily as needed (cramping)., Disp: , Rfl:    meclizine (ANTIVERT) 25 MG tablet, Take 25 mg by mouth every 4 (four) hours as needed for dizziness (vertigo)., Disp: , Rfl:    methocarbamol (ROBAXIN) 500 MG tablet, Take 500 mg by mouth every 8 (eight) hours as needed for muscle spasms., Disp: , Rfl:    morphine  (MSIR) 15 MG tablet, Take 15 mg by mouth 3 (three) times daily as needed for severe pain (pain score 7-10)., Disp: , Rfl:    promethazine  (PHENERGAN ) 25 MG tablet, TAKE 1/2 TO 1 (ONE-HALF TO ONE) TABLET BY MOUTH EVERY 8 HOURS AS NEEDED FOR NAUSEA (Patient taking  differently: Take 25 mg by mouth every 4 (four) hours as needed for vomiting or nausea.), Disp: 40 tablet, Rfl: 0   RESTASIS 0.05 % ophthalmic emulsion, Place 1 drop into both eyes at bedtime., Disp: , Rfl:    SUDOGEST MAXIMUM STRENGTH 30 MG tablet, TAKE 1 TABLET BY MOUTH TWICE DAILY AS NEEDED FOR CONGESTION (Patient taking differently: Take 30 mg by mouth every 4 (four) hours as needed for congestion.), Disp: 90 tablet, Rfl: 0   tirzepatide (MOUNJARO) 7.5 MG/0.5ML Pen, Inject 7.5 mg into the skin once a week., Disp: 2 mL, Rfl: 1   TURMERIC PO, Take 1,500 mg by mouth daily  as needed (inflammation)., Disp: , Rfl:    Vitamin D , Ergocalciferol , (DRISDOL ) 1.25 MG (50000 UNIT) CAPS capsule, Take 1 capsule (50,000 Units total) by mouth every 7 (seven) days., Disp: 5 capsule, Rfl: 2   azithromycin  (ZITHROMAX ) 250 MG tablet, Take 2 tablets on day 1, then 1 tablet daily on days 2 through 5, Disp: 6 tablet, Rfl: 0   fluconazole  (DIFLUCAN ) 150 MG tablet, Take 1 tab by mouth today repeat in 5 days, Disp: 2 tablet, Rfl: 0   Allergies  Allergen Reactions   Doxycycline  Anaphylaxis, Diarrhea and Nausea And Vomiting    headache   Empagliflozin      Other Reaction(s): Other (See Comments)  Yeast and urinary infection   Lysteda [Tranexamic Acid] Anaphylaxis    Tongue swelling, facial numbness, hand went numb. Felt like throat was closing    Megestrol Anaphylaxis    Tongue swelling, felt like throat was closing    Oxycodone -Acetaminophen  Hives and Nausea And Vomiting    Can take plain tylenol    Prochlorperazine Edisylate Other (See Comments)    Hyper and jitteriness   Amoxicillin  Nausea And Vomiting and Other (See Comments)   Cortisone     Flushed    Eszopiclone Other (See Comments)    Metallic taste and became un effective    Keppra [Levetiracetam] Other (See Comments)    hyperactive   Latex     Condoms cause vaginal irritation   Neurontin  [Gabapentin ] Nausea And Vomiting   Ozempic  (0.25 Or 0.5  Mg-Dose) [Semaglutide (0.25 Or 0.5mg -Dos)] Diarrhea and Nausea And Vomiting    Bad taste in mouth    Propoxyphene Hives   Robitussin Dm Max Day-Night Hives   Tape     PAPER TAPE-skin burns/severe irritation  PATIENT DOES NOT TOLERATE PAPER TAPE   Trileptal [Oxcarbazepine] Other (See Comments)    REACTION: immune system suppressed   Bactrim  [Sulfamethoxazole -Trimethoprim ] Rash   Bupropion Rash   Codeine Rash   Moxifloxacin Swelling and Rash   Vicodin [Hydrocodone-Acetaminophen ] Rash     Review of Systems  Constitutional: Negative.   HENT:  Positive for ear pain.   Respiratory: Negative.    Cardiovascular: Negative.   Gastrointestinal: Negative.   Neurological: Negative.   Psychiatric/Behavioral: Negative.       Today's Vitals   08/19/23 1413 08/19/23 1433  BP: (!) 152/82 (!) 150/100  Pulse: 94   Temp: 98.8 F (37.1 C)   SpO2: 98%   Weight: (!) 365 lb (165.6 kg)   Height: 5\' 6"  (1.676 m)    Body mass index is 58.91 kg/m.  Wt Readings from Last 3 Encounters:  08/19/23 (!) 365 lb (165.6 kg)  08/08/23 (!) 365 lb 9.6 oz (165.8 kg)  07/09/23 (!) 366 lb (166 kg)     Objective:  Physical Exam Vitals and nursing note reviewed.  Constitutional:      Appearance: Normal appearance. She is obese.  HENT:     Head: Normocephalic and atraumatic.     Salivary Glands: Right salivary gland is tender.     Comments: Pre-auricular LAN    Right Ear: Ear canal and external ear normal. There is no impacted cerumen. Tympanic membrane is bulging.     Left Ear: Tympanic membrane, ear canal and external ear normal. There is no impacted cerumen.  Eyes:     Extraocular Movements: Extraocular movements intact.  Cardiovascular:     Rate and Rhythm: Normal rate and regular rhythm.     Heart sounds: Normal heart sounds.  Pulmonary:  Effort: Pulmonary effort is normal.     Breath sounds: Normal breath sounds.  Musculoskeletal:     Cervical back: Normal range of motion.  Skin:     General: Skin is warm.  Neurological:     General: No focal deficit present.     Mental Status: She is alert.  Psychiatric:        Mood and Affect: Mood normal.        Behavior: Behavior normal.       Assessment And Plan:  Acute swimmer's ear of right side Assessment & Plan: Persistent earache with improvement from antibiotic ear drops.  - Prescribe Azithromycin  due to extensive abx allergies - No relief with Ciprodex  eardrops  Orders: -     Azithromycin ; Take 2 tablets on day 1, then 1 tablet daily on days 2 through 5  Dispense: 6 tablet; Refill: 0  Vertigo Assessment & Plan: Intermittent vertigo managed with meclizine. Associated with prior head injury. - Continue meclizine as needed.   Acute vaginitis -     Fluconazole ; Take 1 tab by mouth today repeat in 5 days  Dispense: 2 tablet; Refill: 0  Essential hypertension, benign Assessment & Plan: Chronic, uncontrolled. Previously intolerant of both losartan  and valsartan  Blood pressure 150/100 mmHg. Current regimen followed. - Continue furosemide  dosing Monday, Wednesday, and Friday. - May need daily dosing since this is only  medication she has tolerated thus far ?? Compliance - Keep upcoming appt with HTN clinic    Return if symptoms worsen or fail to improve.  Patient was given opportunity to ask questions. Patient verbalized understanding of the plan and was able to repeat key elements of the plan. All questions were answered to their satisfaction.   I, Smiley Dung, MD, have reviewed all documentation for this visit. The documentation on 08/19/23 for the exam, diagnosis, procedures, and orders are all accurate and complete.   IF YOU HAVE BEEN REFERRED TO A SPECIALIST, IT MAY TAKE 1-2 WEEKS TO SCHEDULE/PROCESS THE REFERRAL. IF YOU HAVE NOT HEARD FROM US /SPECIALIST IN TWO WEEKS, PLEASE GIVE US  A CALL AT [phone].   THE PATIENT IS ENCOURAGED TO PRACTICE SOCIAL DISTANCING DUE TO THE COVID-19 PANDEMIC.

## 2023-08-19 NOTE — Assessment & Plan Note (Signed)
 Intermittent vertigo managed with meclizine. Associated with prior head injury. - Continue meclizine as needed.

## 2023-08-19 NOTE — Patient Instructions (Signed)
Earache, Adult An earache, or ear pain, can be caused by many things, including: An infection. Ear wax buildup. Ear pressure. Something in the ear that should not be there (foreign body). A sore throat. Tooth problems. Jaw problems. Treatment of the earache will depend on the cause. If the cause is not clear or cannot be known, you may need to watch your symptoms until your earache goes away or until a cause is found. Follow these instructions at home: Medicines Take or apply over-the-counter and prescription medicines only as told by your health care provider. If you were prescribed antibiotics, use them as told by your health care provider. Do not stop using the antibiotic even if you start to feel better. Do not put anything in your ear other than medicine that is prescribed by your health care provider. Managing pain     If directed, apply heat to the affected area as often as told by your health care provider. Use the heat source that your health care provider recommends, such as a moist heat pack or a heating pad. Place a towel between your skin and the heat source. Leave the heat on for 20-30 minutes. If your skin turns bright red, remove the heat right away to prevent burns. The risk of burns is higher if you cannot feel pain, heat, or cold. If directed, put ice on the affected area. To do this: Put ice in a plastic bag. Place a towel between your skin and the bag. Leave the ice on for 20 minutes, 2-3 times a day. If your skin turns bright red, remove the ice right away to prevent skin damage. The risk of skin damage is higher if you cannot feel pain, heat, or cold.  General instructions Pay attention to any changes in your symptoms. Try resting in an upright position instead of lying down. This may help to reduce pressure in your ear and relieve pain. Chew gum if it helps to relieve your ear pain. Treat any allergies as told by your health care provider. Drink enough fluid  to keep your urine pale yellow. It is up to you to get the results of any tests that were done. Ask your health care provider, or the department that is doing the tests, when your results will be ready. Contact a health care provider if: Your pain does not improve within 2 days. Your earache gets worse. You have new symptoms. You have a fever. Get help right away if: You have a severe headache. You have a stiff neck. You have trouble swallowing. You have redness or swelling behind your ear. You have fluid or blood coming from your ear. You have hearing loss. You feel dizzy. This information is not intended to replace advice given to you by your health care provider. Make sure you discuss any questions you have with your health care provider. Document Revised: 08/07/2021 Document Reviewed: 08/07/2021 [employer] Patient Education  2024 [employer]  

## 2023-08-19 NOTE — Assessment & Plan Note (Signed)
 Persistent earache with improvement from antibiotic ear drops.  - Prescribe Azithromycin  due to extensive abx allergies - No relief with Ciprodex  eardrops

## 2023-08-21 DIAGNOSIS — M1712 Unilateral primary osteoarthritis, left knee: Secondary | ICD-10-CM | POA: Diagnosis not present

## 2023-08-26 DIAGNOSIS — M1711 Unilateral primary osteoarthritis, right knee: Secondary | ICD-10-CM | POA: Diagnosis not present

## 2023-08-28 DIAGNOSIS — M1712 Unilateral primary osteoarthritis, left knee: Secondary | ICD-10-CM | POA: Diagnosis not present

## 2023-09-04 DIAGNOSIS — M1711 Unilateral primary osteoarthritis, right knee: Secondary | ICD-10-CM | POA: Diagnosis not present

## 2023-09-05 ENCOUNTER — Other Ambulatory Visit: Payer: Self-pay | Admitting: Family Medicine

## 2023-09-05 DIAGNOSIS — E559 Vitamin D deficiency, unspecified: Secondary | ICD-10-CM

## 2023-09-06 DIAGNOSIS — M1712 Unilateral primary osteoarthritis, left knee: Secondary | ICD-10-CM | POA: Diagnosis not present

## 2023-09-09 NOTE — Therapy (Signed)
 OUTPATIENT PHYSICAL THERAPY LOWER EXTREMITY EVALUATION   Patient Name: [name] Wright MRN: [id number] DOB:[birth year], 58 y.o., female Today's Date: 09/10/2023  END OF SESSION:  PT End of Session - 09/10/23 1711     Visit Number 1    Date for PT Re-Evaluation 11/08/23    Authorization Type [employer] mcr    PT Start Time 1315    PT Stop Time 1359    PT Time Calculation (min) 44 min    Activity Tolerance Patient tolerated treatment well;Patient limited by pain    Behavior During Therapy [facility] for tasks assessed/performed             Past Medical History:  Diagnosis Date   Acute cystitis    Allergic rhinitis    Childhood asthma    no problems as adult - no inhaler   Chronic insomnia    Chronic pain due to trauma 02/2001   closed head trauma - Followed by Dr Edgar Goods [facility]   Complication of anesthesia    Dyspepsia    Family history of ovarian cancer    Head trauma 02/24/2001   closed   History of kidney stones    passed stone - no surgery required   Hypertension    Hypothyroidism    PONV (postoperative nausea and vomiting)    Pre-diabetes    Sleep apnea    Pt denies   SVD (spontaneous vaginal delivery)    fetal demise at 5 months   Past Surgical History:  Procedure Laterality Date   APPENDECTOMY     BIOPSY  08/25/2020   Procedure: BIOPSY;  Surgeon: Annis Kinder, DO;  Location: [facility];  Service: Gastroenterology;;   COLONOSCOPY WITH PROPOFOL  N/A 08/25/2020   Procedure: COLONOSCOPY WITH PROPOFOL ;  Surgeon: Annis Kinder, DO;  Location: [facility];  Service: Gastroenterology;  Laterality: N/A;   DILATATION & CURETTAGE/HYSTEROSCOPY WITH MYOSURE N/A 04/15/2017   Procedure: DILATATION & CURETTAGE/HYSTEROSCOPY WITH MYOSURE POLYPECTOMY;  Surgeon: Thora Flint, MD;  Location: [facility];  Service: Gynecology;  Laterality: N/A;   DILATATION & CURETTAGE/HYSTEROSCOPY WITH MYOSURE N/A 06/18/2019   Procedure: DILATATION &  CURETTAGE/HYSTEROSCOPY WITH  MYOSURE;  Surgeon: Thora Flint, MD;  Location: [facility];  Service: Gynecology;  Laterality: N/A;   DILATATION & CURETTAGE/HYSTEROSCOPY WITH MYOSURE N/A 06/07/2023   Procedure: DILATATION & CURETTAGE/HYSTEROSCOPY WITH  MYOSURE;  Surgeon: Thora Flint, MD;  Location: [facility];  Service: Gynecology;  Laterality: N/A;  POSSIBLE MYOSURE [facility]   ESOPHAGOGASTRODUODENOSCOPY (EGD) WITH PROPOFOL  N/A 08/25/2020   Procedure: ESOPHAGOGASTRODUODENOSCOPY (EGD) WITH PROPOFOL ;  Surgeon: Annis Kinder, DO;  Location: [facility];  Service: Gastroenterology;  Laterality: N/A;   EYE SURGERY     cataracts removed   INCONTINENCE SURGERY     PILONIDAL CYST EXCISION     x2   POLYPECTOMY  08/25/2020   Procedure: POLYPECTOMY;  Surgeon: Annis Kinder, DO;  Location: [facility];  Service: Gastroenterology;;   repair of toe laceration     dermabond to right big toe right foot   repair of torn ligament right leg     patient denies this surgery   right foot fracture     cast only   TONSILLECTOMY     WISDOM TOOTH EXTRACTION     Patient Active Problem List   Diagnosis Date Noted   Vertigo 08/19/2023   Swimmer's ear 08/19/2023   Rash and nonspecific skin eruption 08/19/2023   Chronic pain of both knees 08/17/2023   Thrush, oral 07/07/2023  Female climacteric state 05/14/2023   Pre-op exam 05/14/2023   Class 3 severe obesity due to excess calories with body mass index (BMI) of 50.0 to 59.9 in adult 03/25/2023   Primary hypothyroidism 03/25/2023   Otalgia, right ear 03/25/2023   Vitamin D  deficiency 02/06/2023   Vitamin B12 deficiency 02/06/2023   Urinary frequency 05/27/2022   Leukocytes in urine 05/27/2022   Costochondritis 05/14/2022   Genetic testing 03/08/2021   Family history of ovarian cancer 02/16/2021   Uncontrolled type 2 diabetes mellitus with hyperglycemia (HCC) 02/07/2021   Essential hypertension, benign 02/07/2021   Gastritis and gastroduodenitis    Nausea  without vomiting    Adenomatous polyp of ascending colon    Adenomatous polyp of transverse colon    Adenomatous polyp of sigmoid colon    Rectal polyp    Adenomatous polyp of descending colon    Gall bladder pain 11/04/2019   Class 3 severe obesity due to excess calories with serious comorbidity and body mass index (BMI) of 60.0 to 69.9 in adult 08/13/2019   Nasal congestion 03/10/2019   Sinus pressure 03/10/2019   Hyperglycemia 01/22/2019   Hypertriglyceridemia 01/22/2019   Combined forms of age-related cataract of left eye 09/10/2017   Musculoskeletal neck pain 07/24/2017   Migraine without aura and without status migrainosus, not intractable 11/12/2016   Encounter for preventative adult health care exam with abnormal findings 09/01/2015   Sinusitis, chronic 09/01/2015   Cough 09/01/2015   Morbid obesity with BMI of 50.0-59.9, adult (HCC) 11/18/2014   RUQ pain 10/27/2014   Urinary incontinence 09/17/2013   Acute sinus infection 10/16/2012   Paresthesias 09/22/2012   Sleep apnea, obstructive 09/22/2012   Elevated BP 07/29/2012   Fatigue 07/10/2012   Anxiety 06/02/2012   Closed head injury 06/02/2012   Depression 06/02/2012   Facial pain 06/02/2012   Leg cramps 04/15/2011   Edema 12/21/2010   Chronic pain due to trauma 03/15/2007   Extrinsic asthma 03/15/2007   INSOMNIA, CHRONIC 11/01/2006   Allergic rhinitis 11/01/2006   DYSPEPSIA, CHRONIC 11/01/2006   Head injury 11/01/2006    PCP: Cleave Curling MD  REFERRING PROVIDER:   Dayne Even, MD    REFERRING DIAG: M17.0 (ICD-10-CM) - Degenerative arthritis of knee, bilateral   THERAPY DIAG:  Bilateral chronic knee pain  Muscle weakness (generalized)  Abnormal posture  Other abnormalities of gait and mobility  Rationale for Evaluation and Treatment: Rehabilitation  ONSET DATE: 1 year  SUBJECTIVE:   SUBJECTIVE STATEMENT: Want to avoid having TKR.  Bone spurs are on the back of my knee caps.  Twisted my  knee about 1 years ago.  Have been using rollator on and off for past 6 months. Can't straighten my knee. Gel shots have helped.  Just had injections last week.  Have a few more scheduled.  Had a surgery in last few months and I have been somewhat immobile. Sleep for about 4 hours before waking  PERTINENT HISTORY: TBI 2008/2014 PAIN:  Are you having pain? Yes: NPRS scale: current 2-3/10; worst 8/10; least 2/10 Pain location: bilat knee joint Pain description: ache Aggravating factors: weight bearing Relieving factors: lidocaine  patches, OTC meds, mag cream  PRECAUTIONS: None  RED FLAGS: None   WEIGHT BEARING RESTRICTIONS: No  FALLS:  Has patient fallen in last 6 months? No tripped walking into the house  LIVING ENVIRONMENT: Lives with: lives with their family Lives in: House/apartment Stairs: 6 step into home Has following equipment at home: Single point cane and rollator and  electric scooterlift chair, shower bench  OCCUPATION: disables  PLOF: Independent uses shower benches  PATIENT GOALS: build stamina, increase strength  NEXT MD VISIT: Friday  OBJECTIVE:  Note: Objective measures were completed at Evaluation unless otherwise noted.  DIAGNOSTIC FINDINGS: none in chart  PATIENT SURVEYS:  LEFS 12/80  COGNITION: Overall cognitive status: Within functional limits for tasks assessed     SENSATION: WFL   MUSCLE LENGTH: Hamstrings:shortened bilaterally   POSTURE: rounded shoulders, forward head, and knee flexed trunk forward flexed.  PALPATION: Moderate TTP about knees bilat joint lines medial> Lateral  LOWER EXTREMITY ROM:  Active ROM Right eval Left eval  Hip flexion    Hip extension    Hip abduction    Hip adduction    Hip internal rotation    Hip external rotation    Knee flexion 100d 103  Knee extension -40 -34  Ankle dorsiflexion    Ankle plantarflexion    Ankle inversion    Ankle eversion     (Blank rows = not tested)  LOWER EXTREMITY  MMT:  HD (lb) Tested in sitting Right eval Left eval  Hip flexion 44.4 45.9  Hip extension    Hip abduction 27.1 25.9  Hip adduction    Hip internal rotation    Hip external rotation    Knee flexion    Knee extension 33.1 25.6  Ankle dorsiflexion    Ankle plantarflexion    Ankle inversion    Ankle eversion     (Blank rows = not tested)   FUNCTIONAL TESTS:  Timed up and go (TUG): 23.91 STS transfers: heavy ue support from pool bench; forward momentum with ue elevated   GAIT: Distance walked: 400 ft Assistive device utilized: rollator Level of assistance: SBA Comments: bilateral flex contractures: no heel strike sliding on toes when advancing LE no "real" swing phase                                                                                                                                TREATMENT  Eval Self care:Posture and Optometrist handout and instruction    PATIENT EDUCATION:  Education details: Discussed eval findings, rehab rationale, aquatic program progression/POC and pools in area. Patient is in agreement  Person educated: Patient Education method: Explanation Education comprehension: verbalized understanding  HOME EXERCISE PROGRAM: ***  ASSESSMENT:  CLINICAL IMPRESSION: Patient is a 58 y.o. f who was seen today for physical therapy evaluation and treatment for bilat knee OA. She presents today with husband assisting she is using a rollator.  May require wc assistance for therapy sessions.  Exam reveals significant bilateral knee flex contractures, limited knee flex, strength deficits, balance and proprioception deficits. Her gait is greatly impeded as she is unable to strike her heels and doesn't clear her feet from the floor/shuffles. She is a high fall risk due to posture and gait deficits.  She will benefit from skilled physical therapy both aquatic  and land.  Will plan on focusing initially in aquatics on pain management, movement toleration  improving ROM and strengthening  OBJECTIVE IMPAIRMENTS: {opptimpairments:25111}.   ACTIVITY LIMITATIONS: {activitylimitations:27494}  PARTICIPATION LIMITATIONS: {participationrestrictions:25113}  PERSONAL FACTORS: {Personal factors:25162} are also affecting patient's functional outcome.   REHAB POTENTIAL: Good  CLINICAL DECISION MAKING: Evolving/moderate complexity  EVALUATION COMPLEXITY: Moderate   GOALS: Goals reviewed with patient? Yes  SHORT TERM GOALS: Target date: *** Pt will tolerate full aquatic sessions consistently without increase in pain and with improving function to demonstrate good toleration and effectiveness of intervention.  Baseline: Goal status: INITIAL  2.  *** Baseline:  Goal status: INITIAL  3.  *** Baseline:  Goal status: INITIAL  4.  *** Baseline:  Goal status: INITIAL  5.  *** Baseline:  Goal status: INITIAL  6.  *** Baseline:  Goal status: INITIAL  LONG TERM GOALS: Target date: ***  Pt to improve on LEFS by at least 9 point to demonstrate statistically significant Improvement in function. Baseline: 12/80 Goal status: INITIAL  2.  Pt will report decrease in pain by at least 50% for improved toleration to activity/quality of life and to demonstrate improved management of pain. Baseline:  Goal status: INITIAL  3.  Pt will improve strength in all areas listed by    to demonstrate improved overall physical function Baseline:  Goal status: INITIAL  4.  *** Baseline:  Goal status: INITIAL  5.  *** Baseline:  Goal status: INITIAL  6.  *** Baseline:  Goal status: INITIAL   PLAN:  PT FREQUENCY: 2x/week  PT DURATION: 8 weeks  PLANNED INTERVENTIONS: 97164- PT Re-evaluation, 97110-Therapeutic exercises, 97530- Therapeutic activity, 97112- Neuromuscular re-education, 97535- Self Care, 32951- Manual therapy, [id number]- Gait training, [phone]- Orthotic Initial, [phone]- Aquatic Therapy, [phone]- Ionotophoresis 4mg /ml Dexamethasone ,  Patient/Family education, Balance training, Stair training, Taping, Dry Needling, Joint mobilization, DME instructions, Cryotherapy, and Moist heat  PLAN FOR NEXT SESSION: alternate land/aquatics for Knee ROM/strengthening; gait; balance an proprioception   Adriana Hopping Laneta Pintos) Jonaya Freshour MPT 09/10/23 5:25 PM [facility] [address] [place], [place], [address] Phone: [phone]   Fax:  [phone]   Date of referral: 08/09/23 Referring provider: Meyer Ada MD Referring diagnosis? Bilat knee OA Treatment diagnosis? (if different than referring diagnosis) no  What was this (referring dx) caused by? Arthritis  Lonne Roan of Condition: Chronic (continuous duration > 3 months)   Laterality: Both  Current Functional Measure Score: LEFS 12/80  Objective measurements identify impairments when they are compared to normal values, the uninvolved extremity, and prior level of function.  [x]  Yes  []  No  Objective assessment of functional ability: Severe functional limitations   Briefly describe symptoms: Bilat knee pain, flexion contractures, gait abnormality, strength deficits  How did symptoms start: OA  Average pain intensity:  Last 24 hours: 3/10  Past week: 3/10  How often does the pt experience symptoms? Constantly  How much have the symptoms interfered with usual daily activities? Quite a bit  How has condition changed since care began at this facility? NA - initial visit  In general, how is the patients overall health? Good   BACK PAIN (STarT Back Screening Tool) No

## 2023-09-10 ENCOUNTER — Encounter (HOSPITAL_BASED_OUTPATIENT_CLINIC_OR_DEPARTMENT_OTHER): Payer: Self-pay | Admitting: Physical Therapy

## 2023-09-10 ENCOUNTER — Ambulatory Visit (HOSPITAL_BASED_OUTPATIENT_CLINIC_OR_DEPARTMENT_OTHER): Attending: Orthopaedic Surgery | Admitting: Physical Therapy

## 2023-09-10 DIAGNOSIS — R2689 Other abnormalities of gait and mobility: Secondary | ICD-10-CM | POA: Diagnosis not present

## 2023-09-10 DIAGNOSIS — M25562 Pain in left knee: Secondary | ICD-10-CM | POA: Insufficient documentation

## 2023-09-10 DIAGNOSIS — M6281 Muscle weakness (generalized): Secondary | ICD-10-CM | POA: Insufficient documentation

## 2023-09-10 DIAGNOSIS — M25561 Pain in right knee: Secondary | ICD-10-CM | POA: Insufficient documentation

## 2023-09-10 DIAGNOSIS — R293 Abnormal posture: Secondary | ICD-10-CM | POA: Insufficient documentation

## 2023-09-10 DIAGNOSIS — G8929 Other chronic pain: Secondary | ICD-10-CM | POA: Diagnosis not present

## 2023-09-11 ENCOUNTER — Encounter (HOSPITAL_BASED_OUTPATIENT_CLINIC_OR_DEPARTMENT_OTHER): Payer: Self-pay

## 2023-09-11 ENCOUNTER — Other Ambulatory Visit: Payer: Self-pay

## 2023-09-12 ENCOUNTER — Institutional Professional Consult (permissible substitution) (HOSPITAL_BASED_OUTPATIENT_CLINIC_OR_DEPARTMENT_OTHER): Admitting: Family

## 2023-09-12 ENCOUNTER — Encounter: Payer: Self-pay | Admitting: Internal Medicine

## 2023-09-13 ENCOUNTER — Other Ambulatory Visit: Payer: Self-pay

## 2023-09-13 DIAGNOSIS — E559 Vitamin D deficiency, unspecified: Secondary | ICD-10-CM

## 2023-09-13 DIAGNOSIS — M1711 Unilateral primary osteoarthritis, right knee: Secondary | ICD-10-CM | POA: Diagnosis not present

## 2023-09-13 MED ORDER — VITAMIN D (ERGOCALCIFEROL) 1.25 MG (50000 UNIT) PO CAPS: 50000.0000 [IU] | ORAL_CAPSULE | ORAL | 0 refills | Status: AC

## 2023-09-16 ENCOUNTER — Encounter (HOSPITAL_BASED_OUTPATIENT_CLINIC_OR_DEPARTMENT_OTHER): Payer: Self-pay

## 2023-09-16 ENCOUNTER — Ambulatory Visit (HOSPITAL_BASED_OUTPATIENT_CLINIC_OR_DEPARTMENT_OTHER)

## 2023-09-16 DIAGNOSIS — M6281 Muscle weakness (generalized): Secondary | ICD-10-CM

## 2023-09-16 DIAGNOSIS — G8929 Other chronic pain: Secondary | ICD-10-CM | POA: Diagnosis not present

## 2023-09-16 DIAGNOSIS — R293 Abnormal posture: Secondary | ICD-10-CM

## 2023-09-16 DIAGNOSIS — M25561 Pain in right knee: Secondary | ICD-10-CM | POA: Diagnosis not present

## 2023-09-16 DIAGNOSIS — R2689 Other abnormalities of gait and mobility: Secondary | ICD-10-CM | POA: Diagnosis not present

## 2023-09-16 DIAGNOSIS — M25562 Pain in left knee: Secondary | ICD-10-CM | POA: Diagnosis not present

## 2023-09-16 NOTE — Therapy (Signed)
 OUTPATIENT PHYSICAL THERAPY LOWER EXTREMITY TREATMENT   Patient Name: [name] Wright MRN: [id number] DOB:12-Aug-1965, 58 y.o., female Today's Date: 09/16/2023  END OF SESSION:  PT End of Session - 09/16/23 1023     Visit Number 2    Date for PT Re-Evaluation 11/08/23    Authorization Type [employer] mcr    PT Start Time 1023    PT Stop Time 1101    PT Time Calculation (min) 38 min    Activity Tolerance Patient tolerated treatment well    Behavior During Therapy WFL for tasks assessed/performed              Past Medical History:  Diagnosis Date   Acute cystitis    Allergic rhinitis    Childhood asthma    no problems as adult - no inhaler   Chronic insomnia    Chronic pain due to trauma 02/2001   closed head trauma - Followed by Dr [name] Wright [facility]   Complication of anesthesia    Dyspepsia    Family history of ovarian cancer    Head trauma 02/24/2001   closed   History of kidney stones    passed stone - no surgery required   Hypertension    Hypothyroidism    PONV (postoperative nausea and vomiting)    Pre-diabetes    Sleep apnea    Pt denies   SVD (spontaneous vaginal delivery)    fetal demise at 5 months   Past Surgical History:  Procedure Laterality Date   APPENDECTOMY     BIOPSY  08/25/2020   Procedure: BIOPSY;  Surgeon: [name] Kinder, DO;  Location: [facility];  Service: Gastroenterology;;   COLONOSCOPY WITH PROPOFOL  N/A 08/25/2020   Procedure: COLONOSCOPY WITH PROPOFOL ;  Surgeon: [name] Kinder, DO;  Location: [facility];  Service: Gastroenterology;  Laterality: N/A;   DILATATION & CURETTAGE/HYSTEROSCOPY WITH MYOSURE N/A 04/15/2017   Procedure: DILATATION & CURETTAGE/HYSTEROSCOPY WITH MYOSURE POLYPECTOMY;  Surgeon: [name] Flint, MD;  Location: [facility];  Service: Gynecology;  Laterality: N/A;   DILATATION & CURETTAGE/HYSTEROSCOPY WITH MYOSURE N/A 06/18/2019   Procedure: DILATATION & CURETTAGE/HYSTEROSCOPY WITH  MYOSURE;   Surgeon: [name] Flint, MD;  Location: [facility];  Service: Gynecology;  Laterality: N/A;   DILATATION & CURETTAGE/HYSTEROSCOPY WITH MYOSURE N/A 06/07/2023   Procedure: DILATATION & CURETTAGE/HYSTEROSCOPY WITH  MYOSURE;  Surgeon: [name] Flint, MD;  Location: [facility];  Service: Gynecology;  Laterality: N/A;  POSSIBLE MYOSURE [facility]   ESOPHAGOGASTRODUODENOSCOPY (EGD) WITH PROPOFOL  N/A 08/25/2020   Procedure: ESOPHAGOGASTRODUODENOSCOPY (EGD) WITH PROPOFOL ;  Surgeon: [name] Kinder, DO;  Location: [facility];  Service: Gastroenterology;  Laterality: N/A;   EYE SURGERY     cataracts removed   INCONTINENCE SURGERY     PILONIDAL CYST EXCISION     x2   POLYPECTOMY  08/25/2020   Procedure: POLYPECTOMY;  Surgeon: [name] Kinder, DO;  Location: [facility];  Service: Gastroenterology;;   repair of toe laceration     dermabond to right big toe right foot   repair of torn ligament right leg     patient denies this surgery   right foot fracture     cast only   TONSILLECTOMY     WISDOM TOOTH EXTRACTION     Patient Active Problem List   Diagnosis Date Noted   Vertigo 08/19/2023   Swimmer's ear 08/19/2023   Rash and nonspecific skin eruption 08/19/2023   Chronic pain of both knees 08/17/2023   Thrush, oral 07/07/2023  Female climacteric state 05/14/2023   Pre-op exam 05/14/2023   Class 3 severe obesity due to excess calories with body mass index (BMI) of 50.0 to 59.9 in adult 03/25/2023   Primary hypothyroidism 03/25/2023   Otalgia, right ear 03/25/2023   Vitamin D  deficiency 02/06/2023   Vitamin B12 deficiency 02/06/2023   Urinary frequency 05/27/2022   Leukocytes in urine 05/27/2022   Costochondritis 05/14/2022   Genetic testing 03/08/2021   Family history of ovarian cancer 02/16/2021   Uncontrolled type 2 diabetes mellitus with hyperglycemia (HCC) 02/07/2021   Essential hypertension, benign 02/07/2021   Gastritis and gastroduodenitis    Nausea without vomiting    Adenomatous polyp  of ascending colon    Adenomatous polyp of transverse colon    Adenomatous polyp of sigmoid colon    Rectal polyp    Adenomatous polyp of descending colon    Gall bladder pain 11/04/2019   Class 3 severe obesity due to excess calories with serious comorbidity and body mass index (BMI) of 60.0 to 69.9 in adult 08/13/2019   Nasal congestion 03/10/2019   Sinus pressure 03/10/2019   Hyperglycemia 01/22/2019   Hypertriglyceridemia 01/22/2019   Combined forms of age-related cataract of left eye 09/10/2017   Musculoskeletal neck pain 07/24/2017   Migraine without aura and without status migrainosus, not intractable 11/12/2016   Encounter for preventative adult health care exam with abnormal findings 09/01/2015   Sinusitis, chronic 09/01/2015   Cough 09/01/2015   Morbid obesity with BMI of 50.0-59.9, adult (HCC) 11/18/2014   RUQ pain 10/27/2014   Urinary incontinence 09/17/2013   Acute sinus infection 10/16/2012   Paresthesias 09/22/2012   Sleep apnea, obstructive 09/22/2012   Elevated BP 07/29/2012   Fatigue 07/10/2012   Anxiety 06/02/2012   Closed head injury 06/02/2012   Depression 06/02/2012   Facial pain 06/02/2012   Leg cramps 04/15/2011   Edema 12/21/2010   Chronic pain due to trauma 03/15/2007   Extrinsic asthma 03/15/2007   INSOMNIA, CHRONIC 11/01/2006   Allergic rhinitis 11/01/2006   DYSPEPSIA, CHRONIC 11/01/2006   Head injury 11/01/2006    PCP: [name] Curling MD  REFERRING PROVIDER:   Dayne Even, MD    REFERRING DIAG: M17.0 (ICD-10-CM) - Degenerative arthritis of knee, bilateral   THERAPY DIAG:  Bilateral chronic knee pain  Muscle weakness (generalized)  Abnormal posture  Other abnormalities of gait and mobility  Rationale for Evaluation and Treatment: Rehabilitation  ONSET DATE: 1 year  SUBJECTIVE:   SUBJECTIVE STATEMENT:  R 4/10, L 2/10. Had gel shot last Friday in both knees which helped.   [name] Wright: Want to avoid having TKR.  Bone spurs are on  the back of my knee caps.  Twisted my knee about 1 years ago.  Have been using rollator on and off for past 6 months. Can't straighten my knee. Gel shots have helped.  Just had injections last week.  Have a few more scheduled.  Had a surgery in last few months and I have been somewhat immobile not moving much at all.. Sleep for about 4 hours before waking  PERTINENT HISTORY: TBI 2008/2014 PAIN:  Are you having pain? Yes: NPRS scale: current 3-4/10; worst 8/10; least 2/10 Pain location: bilat knee joint Pain description: ache Aggravating factors: weight bearing Relieving factors: lidocaine  patches, OTC meds, mag cream  PRECAUTIONS: None  RED FLAGS: None   WEIGHT BEARING RESTRICTIONS: No  FALLS:  Has patient fallen in last 6 months? No tripped walking into the house  LIVING ENVIRONMENT: Lives with:  lives with their family Lives in: House/apartment Stairs: 6 step into home Has following equipment at home: Single point cane and rollator and electric scooterlift chair, shower bench  OCCUPATION: disables  PLOF: Independent uses shower benches  PATIENT GOALS: build stamina, increase strength  NEXT MD VISIT: Friday  OBJECTIVE:  Note: Objective measures were completed at Evaluation unless otherwise noted.  DIAGNOSTIC FINDINGS: none in chart  PATIENT SURVEYS:  LEFS 12/80  COGNITION: Overall cognitive status: Within functional limits for tasks assessed     SENSATION: WFL   MUSCLE LENGTH: Hamstrings:shortened bilaterally   POSTURE: rounded shoulders, forward head, and knee flexed trunk forward flexed.  PALPATION: Moderate TTP about knees bilat joint lines medial> Lateral  LOWER EXTREMITY ROM:  Active ROM Right eval Left eval  Hip flexion    Hip extension    Hip abduction    Hip adduction    Hip internal rotation    Hip external rotation    Knee flexion 100d 103  Knee extension -40 -34  Ankle dorsiflexion    Ankle plantarflexion    Ankle inversion     Ankle eversion     (Blank rows = not tested)  LOWER EXTREMITY MMT:  HD (lb) Tested in sitting Right eval Left eval  Hip flexion 44.4 45.9  Hip extension    Hip abduction 27.1 25.9  Hip adduction    Hip internal rotation    Hip external rotation    Knee flexion    Knee extension 33.1 25.6  Ankle dorsiflexion    Ankle plantarflexion    Ankle inversion    Ankle eversion     (Blank rows = not tested)   FUNCTIONAL TESTS:  Timed up and go (TUG): 23.91 STS transfers: heavy ue support from pool bench; forward momentum with ue elevated   GAIT: Distance walked: 100 ft Assistive device utilized: rollator Level of assistance: SBA Comments: bilateral flex contractures: no heel strike, sliding on toes when advancing LE during swing, forward flex trunk posture                                                                                                                                TREATMENT   09/16/23 Contract relax for bil HS 5" holds- ball placed under opp knee for support - STM to tibialis anterior in supine (ball under knee)  -passive stretching of tib anterior, HS -LAQ 2x10ea  Eval Self care:Posture and Optometrist handout and instruction    PATIENT EDUCATION:  Education details: Discussed eval findings, rehab rationale, aquatic program progression/POC and pools in area. Patient is in agreement  Person educated: Patient Education method: Explanation Education comprehension: verbalized understanding  HOME EXERCISE PROGRAM: TBA  ASSESSMENT:  CLINICAL IMPRESSION:  Patient with significant bilateral hamstring tightness, tibialis anterior tightness, and IT band tightness.  Patient very limited in mobility due to tightness and discomfort associated with this.  Patient also experiences catching under patella with certain movements.  Spent time on manual interventions to address tightness in bilateral tibialis anterior today.  Also employed contract relax techniques  to improve hamstring extensibility.  Instructed her in how to do this with husband at home if she feels this is beneficial.  Patient did report mild discomfort with exercises though had overall good tolerance.  Patient will benefit from continued aquatic PT due to buoyancy and decrease stress on bilateral LEs.  Eval: Patient is a 58 y.o. f who was seen today for physical therapy evaluation and treatment for bilat knee OA. She presents today with husband assisting she is using a rollator.  May require wc assistance for therapy sessions.  Exam reveals significant bilateral knee flex contractures, limited knee flex, strength deficits, balance and proprioception deficits. Her gait is greatly impeded as she is unable to strike her heels and doesn't clear her feet from the floor/shuffles. She is a high fall risk due to posture and gait deficits.  She will benefit from skilled physical therapy both aquatic and land.  Will plan on focusing initially in aquatics on pain management, movement toleration improving ROM and strengthening then progress fairly quickly to alternating with land. Goals to improve all deficits which are significantly affecting her functional mobility and ADL's  OBJECTIVE IMPAIRMENTS: Abnormal gait, decreased activity tolerance, decreased balance, decreased coordination, decreased endurance, decreased knowledge of condition, decreased knowledge of use of DME, decreased mobility, difficulty walking, decreased ROM, decreased strength, impaired flexibility, postural dysfunction, obesity, and pain.   ACTIVITY LIMITATIONS: carrying, lifting, bending, sitting, standing, squatting, sleeping, stairs, transfers, hygiene/grooming, and locomotion level  PARTICIPATION LIMITATIONS: meal prep, cleaning, laundry, driving, shopping, community activity, occupation, and yard work  PERSONAL FACTORS: Behavior pattern, Fitness, Past/current experiences, and Time since onset of injury/illness/exacerbation are also  affecting patient's functional outcome.   REHAB POTENTIAL: Good  CLINICAL DECISION MAKING: Evolving/moderate complexity  EVALUATION COMPLEXITY: Moderate   GOALS: Goals reviewed with patient? Yes  SHORT TERM GOALS: Target date: 10/11/23 Pt will tolerate full aquatic sessions consistently without increase in pain and with improving function to demonstrate good toleration and effectiveness of intervention.  Baseline: Goal status: INITIAL  2.  Pt will tolerate walking to and from setting and engaging in aquatic therapy session without excessive fatigue or increase in pain to demonstrate improved toleration to activity. Baseline: unable Goal status: INITIAL  3.  Pt will be indep with stair negotiation into and out of pool consistently using hand rail Baseline:  Goal status: INITIAL    4. Pt to verbalize understanding of the importance of exercise/movement on overall well being.    Baseline: immobile for up towards 1 year    Goal status: INITIAL   LONG TERM GOALS: Target date: 11/08/23  Pt to improve on LEFS by at least 9 point to demonstrate statistically significant Improvement in function. Baseline: 12/80 Goal status: INITIAL  2.  Pt will report decrease in pain by at least 50% for improved toleration to activity/quality of life and to demonstrate improved management of pain. Baseline:  Goal status: INITIAL  3.  Pt will improve strength in knee extension by at least 10 lbs to demonstrate improved overall physical function Baseline:  Goal status: INITIAL  4.  Pt to improve bilateral knee extension by 50% for improved gait. Baseline:  Goal status: INITIAL    5. Pt will be indep with final HEP's (land and aquatic as appropriate) for continued management of condition    Baseline:    Goal status: INITIAL   PLAN:  PT  FREQUENCY: 2x/week  PT DURATION: 8 weeks  PLANNED INTERVENTIONS: [id number]- PT Re-evaluation, 97110-Therapeutic exercises, 97530- Therapeutic activity, 97112-  Neuromuscular re-education, 97535- Self Care, [id number]- Manual therapy, [phone]- Gait training, [phone]- Orthotic Initial, [phone]- Aquatic Therapy, [phone]- Ionotophoresis 4mg /ml Dexamethasone , Patient/Family education, Balance training, Stair training, Taping, Dry Needling, Joint mobilization, DME instructions, Cryotherapy, and Moist heat  PLAN FOR NEXT SESSION: alternate land/aquatics for Knee ROM/strengthening; gait; balance an proprioception  Herb Loges, PTA  09/16/23 2:19 PM [facility] [address] [place], [place], [address] Phone: [phone]   Fax:  [phone]   Date of referral: 08/09/23 Referring provider: Meyer Ada MD Referring diagnosis? Bilat knee OA Treatment diagnosis? (if different than referring diagnosis) no  What was this (referring dx) caused by? Arthritis  Lonne Roan of Condition: Chronic (continuous duration > 3 months)   Laterality: Both  Current Functional Measure Score: LEFS 12/80  Objective measurements identify impairments when they are compared to normal values, the uninvolved extremity, and prior level of function.  [x]  Yes  []  No  Objective assessment of functional ability: Severe functional limitations   Briefly describe symptoms: Bilat knee pain, flexion contractures, gait abnormality, strength deficits  How did symptoms start: OA  Average pain intensity:  Last 24 hours: 3/10  Past week: 3/10  How often does the pt experience symptoms? Constantly  How much have the symptoms interfered with usual daily activities? Quite a bit  How has condition changed since care began at this facility? NA - initial visit  In general, how is the patients overall health? Good   BACK PAIN (STarT Back Screening Tool) No

## 2023-09-17 ENCOUNTER — Encounter (HOSPITAL_BASED_OUTPATIENT_CLINIC_OR_DEPARTMENT_OTHER): Payer: Self-pay | Admitting: Physical Therapy

## 2023-09-17 ENCOUNTER — Ambulatory Visit (HOSPITAL_BASED_OUTPATIENT_CLINIC_OR_DEPARTMENT_OTHER): Admitting: Physical Therapy

## 2023-09-17 DIAGNOSIS — G8929 Other chronic pain: Secondary | ICD-10-CM

## 2023-09-17 DIAGNOSIS — R293 Abnormal posture: Secondary | ICD-10-CM | POA: Diagnosis not present

## 2023-09-17 DIAGNOSIS — R2689 Other abnormalities of gait and mobility: Secondary | ICD-10-CM | POA: Diagnosis not present

## 2023-09-17 DIAGNOSIS — M6281 Muscle weakness (generalized): Secondary | ICD-10-CM

## 2023-09-17 DIAGNOSIS — M25561 Pain in right knee: Secondary | ICD-10-CM | POA: Diagnosis not present

## 2023-09-17 DIAGNOSIS — M25562 Pain in left knee: Secondary | ICD-10-CM | POA: Diagnosis not present

## 2023-09-17 NOTE — Therapy (Signed)
 OUTPATIENT PHYSICAL THERAPY LOWER EXTREMITY TREATMENT   Patient Name: [name] Wright MRN: [id number] DOB:[birth year], 58 y.o., female Today's Date: 09/17/2023  END OF SESSION:  PT End of Session - 09/17/23 0853     Visit Number 3    Date for PT Re-Evaluation 11/08/23    Authorization Type [employer] mcr    PT Start Time 0850    PT Stop Time 0930    PT Time Calculation (min) 40 min    Activity Tolerance Patient tolerated treatment well    Behavior During Therapy [facility] for tasks assessed/performed              Past Medical History:  Diagnosis Date   Acute cystitis    Allergic rhinitis    Childhood asthma    no problems as adult - no inhaler   Chronic insomnia    Chronic pain due to trauma 02/2001   closed head trauma - Followed by Dr Edgar Goods [facility]   Complication of anesthesia    Dyspepsia    Family history of ovarian cancer    Head trauma 02/24/2001   closed   History of kidney stones    passed stone - no surgery required   Hypertension    Hypothyroidism    PONV (postoperative nausea and vomiting)    Pre-diabetes    Sleep apnea    Pt denies   SVD (spontaneous vaginal delivery)    fetal demise at 5 months   Past Surgical History:  Procedure Laterality Date   APPENDECTOMY     BIOPSY  08/25/2020   Procedure: BIOPSY;  Surgeon: Annis Kinder, DO;  Location: [facility];  Service: Gastroenterology;;   COLONOSCOPY WITH PROPOFOL  N/A 08/25/2020   Procedure: COLONOSCOPY WITH PROPOFOL ;  Surgeon: Annis Kinder, DO;  Location: [facility];  Service: Gastroenterology;  Laterality: N/A;   DILATATION & CURETTAGE/HYSTEROSCOPY WITH MYOSURE N/A 04/15/2017   Procedure: DILATATION & CURETTAGE/HYSTEROSCOPY WITH MYOSURE POLYPECTOMY;  Surgeon: Thora Flint, MD;  Location: [facility];  Service: Gynecology;  Laterality: N/A;   DILATATION & CURETTAGE/HYSTEROSCOPY WITH MYOSURE N/A 06/18/2019   Procedure: DILATATION & CURETTAGE/HYSTEROSCOPY WITH  MYOSURE;   Surgeon: Thora Flint, MD;  Location: [facility];  Service: Gynecology;  Laterality: N/A;   DILATATION & CURETTAGE/HYSTEROSCOPY WITH MYOSURE N/A 06/07/2023   Procedure: DILATATION & CURETTAGE/HYSTEROSCOPY WITH  MYOSURE;  Surgeon: Thora Flint, MD;  Location[facility] OR;  Service: Gynecology;  Laterality: N/A;  POSSIBLE MYOSURE [facility]   ESOPHAGOGASTRODUODENOSCOPY (EGD) WITH PROPOFOL  N/A 08/25/2020   Procedure: ESOPHAGOGASTRODUODENOSCOPY (EGD) WITH PROPOFOL ;  Surgeon: Annis Kinder, DO;  Location: [facility];  Service: Gastroenterology;  Laterality: N/A;   EYE SURGERY     cataracts removed   INCONTINENCE SURGERY     PILONIDAL CYST EXCISION     x2   POLYPECTOMY  08/25/2020   Procedure: POLYPECTOMY;  Surgeon: Annis Kinder, DO;  Location: [facility];  Service: Gastroenterology;;   repair of toe laceration     dermabond to right big toe right foot   repair of torn ligament right leg     patient denies this surgery   right foot fracture     cast only   TONSILLECTOMY     WISDOM TOOTH EXTRACTION     Patient Active Problem List   Diagnosis Date Noted   Vertigo 08/19/2023   Swimmer's ear 08/19/2023   Rash and nonspecific skin eruption 08/19/2023   Chronic pain of both knees 08/17/2023   Thrush, oral 07/07/2023  Female climacteric state 05/14/2023   Pre-op exam 05/14/2023   Class 3 severe obesity due to excess calories with body mass index (BMI) of 50.0 to 59.9 in adult 03/25/2023   Primary hypothyroidism 03/25/2023   Otalgia, right ear 03/25/2023   Vitamin D  deficiency 02/06/2023   Vitamin B12 deficiency 02/06/2023   Urinary frequency 05/27/2022   Leukocytes in urine 05/27/2022   Costochondritis 05/14/2022   Genetic testing 03/08/2021   Family history of ovarian cancer 02/16/2021   Uncontrolled type 2 diabetes mellitus with hyperglycemia (HCC) 02/07/2021   Essential hypertension, benign 02/07/2021   Gastritis and gastroduodenitis    Nausea without vomiting    Adenomatous polyp  of ascending colon    Adenomatous polyp of transverse colon    Adenomatous polyp of sigmoid colon    Rectal polyp    Adenomatous polyp of descending colon    Gall bladder pain 11/04/2019   Class 3 severe obesity due to excess calories with serious comorbidity and body mass index (BMI) of 60.0 to 69.9 in adult 08/13/2019   Nasal congestion 03/10/2019   Sinus pressure 03/10/2019   Hyperglycemia 01/22/2019   Hypertriglyceridemia 01/22/2019   Combined forms of age-related cataract of left eye 09/10/2017   Musculoskeletal neck pain 07/24/2017   Migraine without aura and without status migrainosus, not intractable 11/12/2016   Encounter for preventative adult health care exam with abnormal findings 09/01/2015   Sinusitis, chronic 09/01/2015   Cough 09/01/2015   Morbid obesity with BMI of 50.0-59.9, adult (HCC) 11/18/2014   RUQ pain 10/27/2014   Urinary incontinence 09/17/2013   Acute sinus infection 10/16/2012   Paresthesias 09/22/2012   Sleep apnea, obstructive 09/22/2012   Elevated BP 07/29/2012   Fatigue 07/10/2012   Anxiety 06/02/2012   Closed head injury 06/02/2012   Depression 06/02/2012   Facial pain 06/02/2012   Leg cramps 04/15/2011   Edema 12/21/2010   Chronic pain due to trauma 03/15/2007   Extrinsic asthma 03/15/2007   INSOMNIA, CHRONIC 11/01/2006   Allergic rhinitis 11/01/2006   DYSPEPSIA, CHRONIC 11/01/2006   Head injury 11/01/2006    PCP: Cleave Curling MD  REFERRING PROVIDER:   Dayne Even, MD    REFERRING DIAG: M17.0 (ICD-10-CM) - Degenerative arthritis of knee, bilateral   THERAPY DIAG:  Bilateral chronic knee pain  Muscle weakness (generalized)  Abnormal posture  Rationale for Evaluation and Treatment: Rehabilitation  ONSET DATE: 1 year  SUBJECTIVE:   SUBJECTIVE STATEMENT:  R 6/10, L 4/10. Am very sore after yesterdays land session. "She worked  more on left than right but both are cranky today"  eval: Want to avoid having TKR.  Bone  spurs are on the back of my knee caps.  Twisted my knee about 1 years ago.  Have been using rollator on and off for past 6 months. Can't straighten my knee. Gel shots have helped.  Just had injections last week.  Have a few more scheduled.  Had a surgery in last few months and I have been somewhat immobile not moving much at all.. Sleep for about 4 hours before waking  PERTINENT HISTORY: TBI 2008/2014 PAIN:  Are you having pain? Yes: NPRS scale: current 3-4/10; worst 8/10; least 2/10 Pain location: bilat knee joint Pain description: ache Aggravating factors: weight bearing Relieving factors: lidocaine  patches, OTC meds, mag cream  PRECAUTIONS: None  RED FLAGS: None   WEIGHT BEARING RESTRICTIONS: No  FALLS:  Has patient fallen in last 6 months? No tripped walking into the house  LIVING ENVIRONMENT:  Lives with: lives with their family Lives in: House/apartment Stairs: 6 step into home Has following equipment at home: Single point cane and rollator and electric scooterlift chair, shower bench  OCCUPATION: disables  PLOF: Independent uses shower benches  PATIENT GOALS: build stamina, increase strength  NEXT MD VISIT: Friday  OBJECTIVE:  Note: Objective measures were completed at Evaluation unless otherwise noted.  DIAGNOSTIC FINDINGS: none in chart  PATIENT SURVEYS:  LEFS 12/80  COGNITION: Overall cognitive status: Within functional limits for tasks assessed     SENSATION: WFL   MUSCLE LENGTH: Hamstrings:shortened bilaterally   POSTURE: rounded shoulders, forward head, and knee flexed trunk forward flexed.  PALPATION: Moderate TTP about knees bilat joint lines medial> Lateral  LOWER EXTREMITY ROM:  Active ROM Right eval Left eval  Hip flexion    Hip extension    Hip abduction    Hip adduction    Hip internal rotation    Hip external rotation    Knee flexion 100d 103  Knee extension -40 -34  Ankle dorsiflexion    Ankle plantarflexion    Ankle  inversion    Ankle eversion     (Blank rows = not tested)  LOWER EXTREMITY MMT:  HD (lb) Tested in sitting Right eval Left eval  Hip flexion 44.4 45.9  Hip extension    Hip abduction 27.1 25.9  Hip adduction    Hip internal rotation    Hip external rotation    Knee flexion    Knee extension 33.1 25.6  Ankle dorsiflexion    Ankle plantarflexion    Ankle inversion    Ankle eversion     (Blank rows = not tested)   FUNCTIONAL TESTS:  Timed up and go (TUG): 23.91 STS transfers: heavy ue support from pool bench; forward momentum with ue elevated   GAIT: Distance walked: 100 ft Assistive device utilized: rollator Level of assistance: SBA Comments: bilateral flex contractures: no heel strike, sliding on toes when advancing LE during swing, forward flex trunk posture                                                                                                                                TREATMENT  09/17/23 [facility] Adult PT Treatment:                                                DATE: 09/17/23 Pt seen for aquatic therapy today.  Treatment took place in water 3.5-4.75 ft in depth at the [facility] pool. Temp of water was 91.  Pt entered the pool via stairs using step to pattern with hand rail, exited via lift for pain management purposes  *Intro to setting *walking forward, back and side stepping with ue support of noodle 4.7 ft *cycling with support yellow noodle *marching forward ue  support noodle *standing ue support wall 4.88ft: df, pf; hip add/abd x 10; relaxed squats x 5 (right knee popped with discomfort ended exercise) *seated on lift: hip add/abd; knee flex/ext.  Pt requires the buoyancy and hydrostatic pressure of water for support, and to offload joints by unweighting joint load by at least 50 % in navel deep water and by at least 75-80% in chest to neck deep water.  Viscosity of the water is needed for resistance of strengthening. Water current  perturbations provides challenge to standing balance requiring increased core activation.    09/16/23 Contract relax for bil HS 5" holds- ball placed under opp knee for support - STM to tibialis anterior in supine (ball under knee)  -passive stretching of tib anterior, HS -LAQ 2x10ea     PATIENT EDUCATION:  Education details: Discussed eval findings, rehab rationale, aquatic program progression/POC and pools in area. Patient is in agreement  Person educated: Patient Education method: Explanation Education comprehension: verbalized understanding  HOME EXERCISE PROGRAM: TBA  ASSESSMENT:  CLINICAL IMPRESSION: Pt demonstrates safety and independence in aquatic setting with therapist instructing from deck. She is confident in setting, moving throughout all depths easily.  Pt is directed through various movement patterns and trials in both sitting and standing positions. She is limited due to knee discomfort but puts forth good effort.  Focus on movement and stretching.  Goals are ongoing.      Eval: Patient is a 58 y.o. f who was seen today for physical therapy evaluation and treatment for bilat knee OA. She presents today with husband assisting she is using a rollator.  May require wc assistance for therapy sessions.  Exam reveals significant bilateral knee flex contractures, limited knee flex, strength deficits, balance and proprioception deficits. Her gait is greatly impeded as she is unable to strike her heels and doesn't clear her feet from the floor/shuffles. She is a high fall risk due to posture and gait deficits.  She will benefit from skilled physical therapy both aquatic and land.  Will plan on focusing initially in aquatics on pain management, movement toleration improving ROM and strengthening then progress fairly quickly to alternating with land. Goals to improve all deficits which are significantly affecting her functional mobility and ADL's  OBJECTIVE IMPAIRMENTS: Abnormal  gait, decreased activity tolerance, decreased balance, decreased coordination, decreased endurance, decreased knowledge of condition, decreased knowledge of use of DME, decreased mobility, difficulty walking, decreased ROM, decreased strength, impaired flexibility, postural dysfunction, obesity, and pain.   ACTIVITY LIMITATIONS: carrying, lifting, bending, sitting, standing, squatting, sleeping, stairs, transfers, hygiene/grooming, and locomotion level  PARTICIPATION LIMITATIONS: meal prep, cleaning, laundry, driving, shopping, community activity, occupation, and yard work  PERSONAL FACTORS: Behavior pattern, Fitness, Past/current experiences, and Time since onset of injury/illness/exacerbation are also affecting patient's functional outcome.   REHAB POTENTIAL: Good  CLINICAL DECISION MAKING: Evolving/moderate complexity  EVALUATION COMPLEXITY: Moderate   GOALS: Goals reviewed with patient? Yes  SHORT TERM GOALS: Target date: 10/11/23 Pt will tolerate full aquatic sessions consistently without increase in pain and with improving function to demonstrate good toleration and effectiveness of intervention.  Baseline: Goal status: INITIAL  2.  Pt will tolerate walking to and from setting and engaging in aquatic therapy session without excessive fatigue or increase in pain to demonstrate improved toleration to activity. Baseline: unable Goal status: INITIAL  3.  Pt will be indep with stair negotiation into and out of pool consistently using hand rail Baseline:  Goal status: INITIAL    4. Pt to  verbalize understanding of the importance of exercise/movement on overall well being.    Baseline: immobile for up towards 1 year    Goal status: INITIAL   LONG TERM GOALS: Target date: 11/08/23  Pt to improve on LEFS by at least 9 point to demonstrate statistically significant Improvement in function. Baseline: 12/80 Goal status: INITIAL  2.  Pt will report decrease in pain by at least 50% for  improved toleration to activity/quality of life and to demonstrate improved management of pain. Baseline:  Goal status: INITIAL  3.  Pt will improve strength in knee extension by at least 10 lbs to demonstrate improved overall physical function Baseline:  Goal status: INITIAL  4.  Pt to improve bilateral knee extension by 50% for improved gait. Baseline:  Goal status: INITIAL    5. Pt will be indep with final HEP's (land and aquatic as appropriate) for continued management of condition    Baseline:    Goal status: INITIAL   PLAN:  PT FREQUENCY: 2x/week  PT DURATION: 8 weeks  PLANNED INTERVENTIONS: 97164- PT Re-evaluation, 97110-Therapeutic exercises, 97530- Therapeutic activity, 97112- Neuromuscular re-education, 97535- Self Care, 13086- Manual therapy, [phone]- Gait training, [phone]- Orthotic Initial, [phone]- Aquatic Therapy, [phone]- Ionotophoresis 4mg /ml Dexamethasone , Patient/Family education, Balance training, Stair training, Taping, Dry Needling, Joint mobilization, DME instructions, Cryotherapy, and Moist heat  PLAN FOR NEXT SESSION: alternate land/aquatics for Knee ROM/strengthening; gait; balance an proprioception  Adriana Hopping Laneta Pintos) Glover Capano MPT 09/17/23 10:10 AM [facility] [address] [place], [place], [address] Phone: [phone]   Fax:  [phone]    Date of referral: 08/09/23 Referring provider: Meyer Ada MD Referring diagnosis? Bilat knee OA Treatment diagnosis? (if different than referring diagnosis) no  What was this (referring dx) caused by? Arthritis  Lonne Roan of Condition: Chronic (continuous duration > 3 months)   Laterality: Both  Current Functional Measure Score: LEFS 12/80  Objective measurements identify impairments when they are compared to normal values, the uninvolved extremity, and prior level of function.  [x]  Yes  []  No  Objective assessment of functional ability: Severe functional  limitations   Briefly describe symptoms: Bilat knee pain, flexion contractures, gait abnormality, strength deficits  How did symptoms start: OA  Average pain intensity:  Last 24 hours: 3/10  Past week: 3/10  How often does the pt experience symptoms? Constantly  How much have the symptoms interfered with usual daily activities? Quite a bit  How has condition changed since care began at this facility? NA - initial visit  In general, how is the patients overall health? Good   BACK PAIN (STarT Back Screening Tool) No

## 2023-09-24 ENCOUNTER — Encounter (HOSPITAL_BASED_OUTPATIENT_CLINIC_OR_DEPARTMENT_OTHER): Payer: Self-pay

## 2023-09-24 ENCOUNTER — Other Ambulatory Visit: Payer: Self-pay | Admitting: Internal Medicine

## 2023-09-24 ENCOUNTER — Ambulatory Visit (HOSPITAL_BASED_OUTPATIENT_CLINIC_OR_DEPARTMENT_OTHER): Admitting: Physical Therapy

## 2023-09-26 ENCOUNTER — Ambulatory Visit: Admitting: Internal Medicine

## 2023-09-26 ENCOUNTER — Encounter (HOSPITAL_BASED_OUTPATIENT_CLINIC_OR_DEPARTMENT_OTHER): Payer: Self-pay

## 2023-09-26 ENCOUNTER — Ambulatory Visit: Payer: Self-pay | Admitting: Internal Medicine

## 2023-09-26 VITALS — BP 124/80 | HR 94 | Temp 98.1°F | Ht 66.0 in | Wt 364.0 lb

## 2023-09-26 DIAGNOSIS — R82998 Other abnormal findings in urine: Secondary | ICD-10-CM

## 2023-09-26 DIAGNOSIS — H9201 Otalgia, right ear: Secondary | ICD-10-CM

## 2023-09-26 DIAGNOSIS — R3915 Urgency of urination: Secondary | ICD-10-CM | POA: Insufficient documentation

## 2023-09-26 LAB — POCT URINALYSIS DIP (CLINITEK)
Bilirubin, UA: NEGATIVE
Glucose, UA: NEGATIVE mg/dL
Ketones, POC UA: NEGATIVE mg/dL
Nitrite, UA: NEGATIVE
POC PROTEIN,UA: 30 — AB
Spec Grav, UA: 1.02 (ref 1.010–1.025)
Urobilinogen, UA: 0.2 U/dL
pH, UA: 6 (ref 5.0–8.0)

## 2023-09-26 MED ORDER — NITROFURANTOIN MONOHYD MACRO 100 MG PO CAPS
100.0000 mg | ORAL_CAPSULE | Freq: Two times a day (BID) | ORAL | 0 refills | Status: AC
Start: 2023-09-26 — End: 2023-10-01

## 2023-09-26 NOTE — Assessment & Plan Note (Addendum)
 Intermittent ear discomfort with occasional drainage and aching. Scarring present.  - Recommend over-the-counter medication equivalent to Norel. - Include recommendation in after-visit summary.

## 2023-09-26 NOTE — Progress Notes (Signed)
 I,[name] Wright, CMA,acting as a Neurosurgeon for Smiley Dung, MD.,have documented all relevant documentation on the behalf of Smiley Dung, MD,as directed by  Smiley Dung, MD while in the presence of Smiley Dung, MD.  Subjective:  Patient ID: [name] Wright , female    DOB: Aug 02, 1965 , 58 y.o.   MRN: [id number]  Chief Complaint  Patient presents with   Urinary Tract Infection    Patient presents today for possible UTI. She experiences burning sensation, smell, milky like discharge. This initially started a week ago. Along with cloudiness. She admits drinking more water than usual.     HPI Discussed the use of AI scribe software for clinical note transcription with the patient, who gave verbal consent to proceed.  History of Present Illness [name] Wright is a 58 year old female who presents with symptoms of a urinary tract infection. She is accompanied by [name] Wright, who is likely a family member.  She experiences dysuria, particularly in the last few seconds of urination, and urinary urgency without a full bladder. Her urine appears cloudy. These symptoms began approximately a week and a half ago and initially improved with increased water intake but have since recurred.  She has a history of allergies to several antibiotics including doxycycline , Bactrim , and penicillin, which limits her treatment options. Macrobid (nitrofurantoin) is one of the few antibiotics she can tolerate.  She recently spent 16 hours in a car, which she believes exacerbated her symptoms due to limited opportunities to urinate frequently.  Occasional right ear aching are reported, which she manages by avoiding washing her hair on the affected side and using over-the-counter ear drops when necessary.     Past Medical History:  Diagnosis Date   Acute cystitis    Allergic rhinitis    Childhood asthma    no problems as adult - no inhaler   Chronic insomnia    Chronic pain due to  trauma 02/2001   closed head trauma - Followed by Dr Edgar Goods [facility]   Complication of anesthesia    Dyspepsia    Family history of ovarian cancer    Head trauma 02/24/2001   closed   History of kidney stones    passed stone - no surgery required   Hypertension    Hypothyroidism    PONV (postoperative nausea and vomiting)    Pre-diabetes    Sleep apnea    Pt denies   SVD (spontaneous vaginal delivery)    fetal demise at 5 months     Family History  Problem Relation Age of Onset   Multiple sclerosis Mother 34   Bladder Cancer Mother 15   Heart Problems Father    Dementia Paternal Aunt    Ovarian cancer Maternal Grandmother    Lung cancer Maternal Grandfather    Tuberculosis Paternal Grandmother      Current Outpatient Medications:    acetaminophen  (TYLENOL ) 500 MG tablet, Take 1,000 mg by mouth every 6 (six) hours as needed for moderate pain (pain score 4-6)., Disp: , Rfl:    albuterol  (PROAIR  HFA) 108 (90 Base) MCG/ACT inhaler, Inhale 2 puffs into the lungs every 4 (four) hours as needed for wheezing or shortness of breath., Disp: 1 each, Rfl: 1   baclofen (LIORESAL) 10 MG tablet, Take 10 mg by mouth 3 (three) times daily as needed for muscle spasms., Disp: , Rfl:    butalbital-acetaminophen -caffeine (FIORICET) 50-325-40 MG tablet, Take 1 tablet by mouth  every 6 (six) hours as needed for headache., Disp: , Rfl:    cetirizine  (ZYRTEC  ALLERGY) 10 MG tablet, Take 1 tablet (10 mg total) by mouth daily. (Patient taking differently: Take 10 mg by mouth daily as needed for allergies.), Disp: 90 tablet, Rfl: 0   Continuous Glucose Sensor (FREESTYLE LIBRE 3 PLUS SENSOR) MISC, Use as directed to check blood sugars once daily., Disp: 3 each, Rfl: 3   cyanocobalamin  (VITAMIN B12) 1000 MCG tablet, Take 1,000 mcg by mouth daily as needed (mouth ulcers)., Disp: , Rfl:    diclofenac (FLECTOR) 1.3 % PTCH, Place 1 patch onto the skin daily as needed (pain)., Disp: , Rfl:     diclofenac (VOLTAREN) 75 MG EC tablet, Take 75 mg by mouth 2 (two) times daily as needed for moderate pain (pain score 4-6)., Disp: , Rfl:    diclofenac Sodium (VOLTAREN) 1 % GEL, Apply 1 Application topically 4 (four) times daily as needed (pain)., Disp: , Rfl:    diphenhydrAMINE  (BENADRYL ) 25 mg capsule, Take 25 mg by mouth every 4 (four) hours as needed for allergies., Disp: , Rfl:    EPINEPHrine 0.3 mg/0.3 mL IJ SOAJ injection, Inject 0.3 mg into the muscle See admin instructions., Disp: , Rfl:    fluconazole  (DIFLUCAN ) 150 MG tablet, Take 1 tab by mouth today repeat in 5 days, Disp: 2 tablet, Rfl: 0   fluticasone  (FLONASE) 50 MCG/ACT nasal spray, Place 1 spray into both nostrils daily as needed for allergies., Disp: , Rfl:    furosemide  (LASIX ) 80 MG tablet, TAKE 1 TABLET BY MOUTH ONCE DAILY AS NEEDED FOR FLUID, Disp: 90 tablet, Rfl: 0   ibuprofen  (ADVIL ) 200 MG tablet, Take 600 mg by mouth every 6 (six) hours as needed for moderate pain (pain score 4-6)., Disp: , Rfl:    levothyroxine  (SYNTHROID ) 100 MCG tablet, TAKE 1 TABLET BY MOUTH BEFORE BREAKFAST, Disp: 90 tablet, Rfl: 0   MAGNESIUM PO, Take 1 tablet by mouth daily as needed (cramping)., Disp: , Rfl:    meclizine (ANTIVERT) 25 MG tablet, Take 25 mg by mouth every 4 (four) hours as needed for dizziness (vertigo)., Disp: , Rfl:    methocarbamol (ROBAXIN) 500 MG tablet, Take 500 mg by mouth every 8 (eight) hours as needed for muscle spasms., Disp: , Rfl:    morphine  (MSIR) 15 MG tablet, Take 15 mg by mouth 3 (three) times daily as needed for severe pain (pain score 7-10)., Disp: , Rfl:    MOUNJARO  7.5 MG/0.5ML Pen, INJECT 7.5 MG SUBCUTANEOUSLY  ONCE A WEEK, Disp: 4 mL, Rfl: 0   nitrofurantoin, macrocrystal-monohydrate, (MACROBID) 100 MG capsule, Take 1 capsule (100 mg total) by mouth 2 (two) times daily for 5 days., Disp: 10 capsule, Rfl: 0   promethazine  (PHENERGAN ) 25 MG tablet, TAKE 1/2 TO 1 (ONE-HALF TO ONE) TABLET BY MOUTH EVERY 8  HOURS AS NEEDED FOR NAUSEA (Patient taking differently: Take 25 mg by mouth every 4 (four) hours as needed for vomiting or nausea.), Disp: 40 tablet, Rfl: 0   RESTASIS 0.05 % ophthalmic emulsion, Place 1 drop into both eyes at bedtime., Disp: , Rfl:    SUDOGEST MAXIMUM STRENGTH 30 MG tablet, TAKE 1 TABLET BY MOUTH TWICE DAILY AS NEEDED FOR CONGESTION (Patient taking differently: Take 30 mg by mouth every 4 (four) hours as needed for congestion.), Disp: 90 tablet, Rfl: 0   TURMERIC PO, Take 1,500 mg by mouth daily as needed (inflammation)., Disp: , Rfl:    Vitamin D , Ergocalciferol , (  DRISDOL ) 1.25 MG (50000 UNIT) CAPS capsule, Take 1 capsule (50,000 Units total) by mouth once a week., Disp: 5 capsule, Rfl: 0   Allergies  Allergen Reactions   Doxycycline  Anaphylaxis, Diarrhea and Nausea And Vomiting    headache   Empagliflozin      Other Reaction(s): Other (See Comments)  Yeast and urinary infection   Lysteda [Tranexamic Acid] Anaphylaxis    Tongue swelling, facial numbness, hand went numb. Felt like throat was closing    Megestrol Anaphylaxis    Tongue swelling, felt like throat was closing    Oxycodone -Acetaminophen  Hives and Nausea And Vomiting    Can take plain tylenol    Prochlorperazine Edisylate Other (See Comments)    Hyper and jitteriness   Amoxicillin  Nausea And Vomiting and Other (See Comments)   Cortisone     Flushed    Eszopiclone Other (See Comments)    Metallic taste and became un effective    Keppra [Levetiracetam] Other (See Comments)    hyperactive   Latex     Condoms cause vaginal irritation   Neurontin  [Gabapentin ] Nausea And Vomiting   Ozempic  (0.25 Or 0.5 Mg-Dose) [Semaglutide (0.25 Or 0.5mg -Dos)] Diarrhea and Nausea And Vomiting    Bad taste in mouth    Propoxyphene Hives   Robitussin Dm Max Day-Night Hives   Tape     PAPER TAPE-skin burns/severe irritation  PATIENT DOES NOT TOLERATE PAPER TAPE   Trileptal [Oxcarbazepine] Other (See Comments)    REACTION:  immune system suppressed   Bactrim  [Sulfamethoxazole -Trimethoprim ] Rash   Bupropion Rash   Codeine Rash   Moxifloxacin Swelling and Rash   Vicodin [Hydrocodone-Acetaminophen ] Rash     Review of Systems  Constitutional: Negative.   Respiratory: Negative.    Cardiovascular: Negative.   Gastrointestinal: Negative.   Genitourinary:  Positive for dysuria and frequency.  Neurological: Negative.   Psychiatric/Behavioral: Negative.       Today's Vitals   09/26/23 1422  BP: 124/80  Pulse: 94  Temp: 98.1 F (36.7 C)  SpO2: 98%  Weight: (!) 364 lb (165.1 kg)  Height: 5' 6 (1.676 m)   Body mass index is 58.75 kg/m.  Wt Readings from Last 3 Encounters:  09/26/23 (!) 364 lb (165.1 kg)  08/19/23 (!) 365 lb (165.6 kg)  08/08/23 (!) 365 lb 9.6 oz (165.8 kg)     Objective:  Physical Exam Vitals and nursing note reviewed.  Constitutional:      Appearance: Normal appearance.  HENT:     Head: Normocephalic and atraumatic.     Ears:     Comments: Scarring R TM  Eyes:     Extraocular Movements: Extraocular movements intact.    Cardiovascular:     Rate and Rhythm: Normal rate and regular rhythm.     Heart sounds: Normal heart sounds.  Pulmonary:     Effort: Pulmonary effort is normal.     Breath sounds: Normal breath sounds.   Musculoskeletal:     Cervical back: Normal range of motion.   Skin:    General: Skin is warm.   Neurological:     General: No focal deficit present.     Mental Status: She is alert.   Psychiatric:        Mood and Affect: Mood normal.        Behavior: Behavior normal.         Assessment And Plan:  Urgency of urination -     POCT URINALYSIS DIP (CLINITEK)  Leukocytes in urine Assessment & Plan: Recurrent UTI  with dysuria, urgency, and cloudy urine. Symptoms improved with increased fluid intake but recurred after prolonged sitting. Multiple antibiotic allergies limit treatment options. Nitrofurantoin tolerated but poses long-term risks. -  Encourage increased fluid intake. - Advise cranberry juice consumption. - Educate on proper hygiene practices. - Discuss potential risks of long-term nitrofurantoin use.  Orders: -     Urine Culture  Otalgia, right ear Assessment & Plan: Intermittent ear discomfort with occasional drainage and aching. Scarring present.  - Recommend over-the-counter medication equivalent to Norel. - Include recommendation in after-visit summary.   Other orders -     Nitrofurantoin Monohyd Macro; Take 1 capsule (100 mg total) by mouth 2 (two) times daily for 5 days.  Dispense: 10 capsule; Refill: 0   Return if symptoms worsen or fail to improve.  Patient was given opportunity to ask questions. Patient verbalized understanding of the plan and was able to repeat key elements of the plan. All questions were answered to their satisfaction.   I, Smiley Dung, MD, have reviewed all documentation for this visit. The documentation on 09/26/23 for the exam, diagnosis, procedures, and orders are all accurate and complete.   IF YOU HAVE BEEN REFERRED TO A SPECIALIST, IT Wright TAKE 1-2 WEEKS TO SCHEDULE/PROCESS THE REFERRAL. IF YOU HAVE NOT HEARD FROM US /SPECIALIST IN TWO WEEKS, PLEASE GIVE US  A CALL AT [phone].   THE PATIENT IS ENCOURAGED TO PRACTICE SOCIAL DISTANCING DUE TO THE COVID-19 PANDEMIC.

## 2023-09-26 NOTE — Patient Instructions (Addendum)
 Phenylephrine /chlorpheniramine  Urinary Tract Infection, Female A urinary tract infection (UTI) is an infection in your urinary tract. The urinary tract is made up of organs that make, store, and get rid of pee (urine) in your body. These organs include: The kidneys. The ureters. The bladder. The urethra. What are the causes? Most UTIs are caused by germs called bacteria. They may be in or near your genitals. These germs grow and cause swelling in your urinary tract. What increases the risk? You're more likely to get a UTI if: You're a female. The urethra is shorter in females than in males. You have a soft tube called a catheter that drains your pee. You can't control when you pee or poop. You have trouble peeing because of: A kidney stone. A urinary blockage. A nerve condition that affects your bladder. Not getting enough to drink. You're sexually active. You use a birth control inside your vagina, like spermicide. You're pregnant. You have low levels of the hormone estrogen in your body. You're an older adult. You're also more likely to get a UTI if you have other health problems. These may include: Diabetes. A weak immune system. Your immune system is your body's defense system. Sickle cell disease. Injury of the spine. What are the signs or symptoms? Symptoms may include: Needing to pee right away. Peeing small amounts often. Pain or burning when you pee. Blood in your pee. Pee that smells bad or odd. Pain in your belly or lower back. You may also: Feel confused. This may be the first symptom in older adults. Vomit. Not feel hungry. Feel tired or easily annoyed. Have a fever or chills. How is this diagnosed? A UTI is diagnosed based on your medical history and an exam. You may also have other tests. These may include: Pee tests. Blood tests. Tests for sexually transmitted infections (STIs). If you've had more than one UTI, you may need to have imaging studies  done to find out why you keep getting them. How is this treated? A UTI can be treated by: Taking antibiotics or other medicines. Drinking enough fluid to keep your pee pale yellow. In rare cases, a UTI can cause a very bad condition called sepsis. Sepsis may be treated in the hospital. Follow these instructions at home: Medicines Take your medicines only as told by your health care provider. If you were given antibiotics, take them as told by your provider. Do not stop taking them even if you start to feel better. General instructions Make sure you: Pee often and fully. Do not hold your pee for a long time. Wipe from front to back after you pee or poop. Use each tissue only once when you wipe. Pee after you have sex. Do not douche or use sprays or powders in your genital area. Contact a health care provider if: Your symptoms don't get better after 1-2 days of taking antibiotics. Your symptoms go away and then come back. You have a fever or chills. You vomit or feel like you may vomit. Get help right away if: You have very bad pain in your back or lower belly. You faint. This information is not intended to replace advice given to you by your health care provider. Make sure you discuss any questions you have with your health care provider. Document Revised: 03/06/2023 Document Reviewed: 06/29/2022 Elsevier Patient Education  2025 [employer].

## 2023-09-26 NOTE — Assessment & Plan Note (Signed)
 Recurrent UTI with dysuria, urgency, and cloudy urine. Symptoms improved with increased fluid intake but recurred after prolonged sitting. Multiple antibiotic allergies limit treatment options. Nitrofurantoin tolerated but poses long-term risks. - Encourage increased fluid intake. - Advise cranberry juice consumption. - Educate on proper hygiene practices. - Discuss potential risks of long-term nitrofurantoin use.

## 2023-09-27 ENCOUNTER — Ambulatory Visit (HOSPITAL_BASED_OUTPATIENT_CLINIC_OR_DEPARTMENT_OTHER): Admitting: Physical Therapy

## 2023-09-27 ENCOUNTER — Encounter (HOSPITAL_BASED_OUTPATIENT_CLINIC_OR_DEPARTMENT_OTHER): Payer: Self-pay | Admitting: Physical Therapy

## 2023-09-27 DIAGNOSIS — R293 Abnormal posture: Secondary | ICD-10-CM

## 2023-09-27 DIAGNOSIS — M25561 Pain in right knee: Secondary | ICD-10-CM | POA: Diagnosis not present

## 2023-09-27 DIAGNOSIS — G8929 Other chronic pain: Secondary | ICD-10-CM | POA: Diagnosis not present

## 2023-09-27 DIAGNOSIS — R2689 Other abnormalities of gait and mobility: Secondary | ICD-10-CM

## 2023-09-27 DIAGNOSIS — M6281 Muscle weakness (generalized): Secondary | ICD-10-CM | POA: Diagnosis not present

## 2023-09-27 DIAGNOSIS — M25562 Pain in left knee: Secondary | ICD-10-CM | POA: Diagnosis not present

## 2023-09-27 NOTE — Therapy (Signed)
 OUTPATIENT PHYSICAL THERAPY LOWER EXTREMITY TREATMENT   Patient Name: [name] Wright MRN: [id number] DOB:08/16/1965, 58 y.o., female Today's Date: 09/27/2023  END OF SESSION:  PT End of Session - 09/27/23 1002     Visit Number 4    Date for PT Re-Evaluation 11/08/23    Authorization Type [employer] mcr approved 16 visits 6/3 - 7-29    PT Start Time 0852    PT Stop Time 0933    PT Time Calculation (min) 41 min    Activity Tolerance Patient tolerated treatment well    Behavior During Therapy [facility] for tasks assessed/performed            Past Medical History:  Diagnosis Date   Acute cystitis    Allergic rhinitis    Childhood asthma    no problems as adult - no inhaler   Chronic insomnia    Chronic pain due to trauma 02/2001   closed head trauma - Followed by Dr Edgar Goods [facility]   Complication of anesthesia    Dyspepsia    Family history of ovarian cancer    Head trauma 02/24/2001   closed   History of kidney stones    passed stone - no surgery required   Hypertension    Hypothyroidism    PONV (postoperative nausea and vomiting)    Pre-diabetes    Sleep apnea    Pt denies   SVD (spontaneous vaginal delivery)    fetal demise at 5 months   Past Surgical History:  Procedure Laterality Date   APPENDECTOMY     BIOPSY  08/25/2020   Procedure: BIOPSY;  Surgeon: Annis Kinder, DO;  Location: [facility];  Service: Gastroenterology;;   COLONOSCOPY WITH PROPOFOL  N/A 08/25/2020   Procedure: COLONOSCOPY WITH PROPOFOL ;  Surgeon: Annis Kinder, DO;  Location: [facility];  Service: Gastroenterology;  Laterality: N/A;   DILATATION & CURETTAGE/HYSTEROSCOPY WITH MYOSURE N/A 04/15/2017   Procedure: DILATATION & CURETTAGE/HYSTEROSCOPY WITH MYOSURE POLYPECTOMY;  Surgeon: Thora Flint, MD;  Location: [facility];  Service: Gynecology;  Laterality: N/A;   DILATATION & CURETTAGE/HYSTEROSCOPY WITH MYOSURE N/A 06/18/2019   Procedure: DILATATION &  CURETTAGE/HYSTEROSCOPY WITH  MYOSURE;  Surgeon: Thora Flint, MD;  Location: [facility];  Service: Gynecology;  Laterality: N/A;   DILATATION & CURETTAGE/HYSTEROSCOPY WITH MYOSURE N/A 06/07/2023   Procedure: DILATATION & CURETTAGE/HYSTEROSCOPY WITH  MYOSURE;  Surgeon: Thora Flint, MD;  Location: [facility] OR;  Service: Gynecology;  Laterality: N/A;  POSSIBLE MYOSURE [facility]   ESOPHAGOGASTRODUODENOSCOPY (EGD) WITH PROPOFOL  N/A 08/25/2020   Procedure: ESOPHAGOGASTRODUODENOSCOPY (EGD) WITH PROPOFOL ;  Surgeon: Annis Kinder, DO;  Location: [facility];  Service: Gastroenterology;  Laterality: N/A;   EYE SURGERY     cataracts removed   INCONTINENCE SURGERY     PILONIDAL CYST EXCISION     x2   POLYPECTOMY  08/25/2020   Procedure: POLYPECTOMY;  Surgeon: Annis Kinder, DO;  Location: [facility];  Service: Gastroenterology;;   repair of toe laceration     dermabond to right big toe right foot   repair of torn ligament right leg     patient denies this surgery   right foot fracture     cast only   TONSILLECTOMY     WISDOM TOOTH EXTRACTION     Patient Active Problem List   Diagnosis Date Noted   Urgency of urination 09/26/2023   Vertigo 08/19/2023   Swimmer's ear 08/19/2023   Rash and nonspecific skin eruption 08/19/2023   Chronic pain of  both knees 08/17/2023   Thrush, oral 07/07/2023   Female climacteric state 05/14/2023   Pre-op exam 05/14/2023   Class 3 severe obesity due to excess calories with body mass index (BMI) of 50.0 to 59.9 in adult 03/25/2023   Primary hypothyroidism 03/25/2023   Otalgia, right ear 03/25/2023   Vitamin D  deficiency 02/06/2023   Vitamin B12 deficiency 02/06/2023   Urinary frequency 05/27/2022   Leukocytes in urine 05/27/2022   Costochondritis 05/14/2022   Genetic testing 03/08/2021   Family history of ovarian cancer 02/16/2021   Uncontrolled type 2 diabetes mellitus with hyperglycemia (HCC) 02/07/2021   Essential hypertension, benign 02/07/2021    Gastritis and gastroduodenitis    Nausea without vomiting    Adenomatous polyp of ascending colon    Adenomatous polyp of transverse colon    Adenomatous polyp of sigmoid colon    Rectal polyp    Adenomatous polyp of descending colon    Gall bladder pain 11/04/2019   Class 3 severe obesity due to excess calories with serious comorbidity and body mass index (BMI) of 60.0 to 69.9 in adult 08/13/2019   Nasal congestion 03/10/2019   Sinus pressure 03/10/2019   Hyperglycemia 01/22/2019   Hypertriglyceridemia 01/22/2019   Combined forms of age-related cataract of left eye 09/10/2017   Musculoskeletal neck pain 07/24/2017   Migraine without aura and without status migrainosus, not intractable 11/12/2016   Encounter for preventative adult health care exam with abnormal findings 09/01/2015   Sinusitis, chronic 09/01/2015   Cough 09/01/2015   Morbid obesity with BMI of 50.0-59.9, adult (HCC) 11/18/2014   RUQ pain 10/27/2014   Urinary incontinence 09/17/2013   Acute sinus infection 10/16/2012   Paresthesias 09/22/2012   Sleep apnea, obstructive 09/22/2012   Elevated BP 07/29/2012   Fatigue 07/10/2012   Anxiety 06/02/2012   Closed head injury 06/02/2012   Depression 06/02/2012   Facial pain 06/02/2012   Leg cramps 04/15/2011   Edema 12/21/2010   Chronic pain due to trauma 03/15/2007   Extrinsic asthma 03/15/2007   INSOMNIA, CHRONIC 11/01/2006   Allergic rhinitis 11/01/2006   DYSPEPSIA, CHRONIC 11/01/2006   Head injury 11/01/2006    PCP: Cleave Curling MD  REFERRING PROVIDER:   Dayne Even, MD    REFERRING DIAG: M17.0 (ICD-10-CM) - Degenerative arthritis of knee, bilateral   THERAPY DIAG:  Bilateral chronic knee pain  Muscle weakness (generalized)  Abnormal posture  Other abnormalities of gait and mobility  Rationale for Evaluation and Treatment: Rehabilitation  ONSET DATE: 1 year  SUBJECTIVE:   SUBJECTIVE STATEMENT:  The patient went on a trip over the past  few days. She reports her knees are really flaired up. Her sciatic nerve is too on the right.   eval: Want to avoid having TKR.  Bone spurs are on the back of my knee caps.  Twisted my knee about 1 years ago.  Have been using rollator on and off for past 6 months. Can't straighten my knee. Gel shots have helped.  Just had injections last week.  Have a few more scheduled.  Had a surgery in last few months and I have been somewhat immobile not moving much at all.. Sleep for about 4 hours before waking  PERTINENT HISTORY: TBI 2008/2014 PAIN:  Are you having pain? Yes: NPRS scale: current 3-4/10; worst 8/10; least 2/10 Pain location: bilat knee joint Pain description: ache Aggravating factors: weight bearing Relieving factors: lidocaine  patches, OTC meds, mag cream  PRECAUTIONS: None  RED FLAGS: None   WEIGHT BEARING  RESTRICTIONS: No  FALLS:  Has patient fallen in last 6 months? No tripped walking into the house  LIVING ENVIRONMENT: Lives with: lives with their family Lives in: House/apartment Stairs: 6 step into home Has following equipment at home: Single point cane and rollator and electric scooterlift chair, shower bench  OCCUPATION: disables  PLOF: Independent uses shower benches  PATIENT GOALS: build stamina, increase strength  NEXT MD VISIT: Friday  OBJECTIVE:  Note: Objective measures were completed at Evaluation unless otherwise noted.  DIAGNOSTIC FINDINGS: none in chart  PATIENT SURVEYS:  LEFS 12/80  COGNITION: Overall cognitive status: Within functional limits for tasks assessed     SENSATION: WFL   MUSCLE LENGTH: Hamstrings:shortened bilaterally   POSTURE: rounded shoulders, forward head, and knee flexed trunk forward flexed.  PALPATION: Moderate TTP about knees bilat joint lines medial> Lateral  LOWER EXTREMITY ROM:  Active ROM Right eval Left eval  Hip flexion    Hip extension    Hip abduction    Hip adduction    Hip internal rotation     Hip external rotation    Knee flexion 100d 103  Knee extension -40 -34  Ankle dorsiflexion    Ankle plantarflexion    Ankle inversion    Ankle eversion     (Blank rows = not tested)  LOWER EXTREMITY MMT:  HD (lb) Tested in sitting Right eval Left eval  Hip flexion 44.4 45.9  Hip extension    Hip abduction 27.1 25.9  Hip adduction    Hip internal rotation    Hip external rotation    Knee flexion    Knee extension 33.1 25.6  Ankle dorsiflexion    Ankle plantarflexion    Ankle inversion    Ankle eversion     (Blank rows = not tested)   FUNCTIONAL TESTS:  Timed up and go (TUG): 23.91 STS transfers: heavy ue support from pool bench; forward momentum with ue elevated   GAIT: Distance walked: 100 ft Assistive device utilized: rollator Level of assistance: SBA Comments: bilateral flex contractures: no heel strike, sliding on toes when advancing LE during swing, forward flex trunk posture                                                                                                                                TREATMENT  6/20  Manual:  Trigger point release to bilateral hamstringing  Extension stretching with distraction  Patella mobilization  Grade II and III PA and AP mobilization   There-ex:  Quad set 2x8 with cuing for technique and home set up  Hamstring stretch 2x30 sec bilateral  Gastroc stretch seated 2x30 sec hold     09/17/23 [facility] Adult PT Treatment:  DATE: 09/17/23 Pt seen for aquatic therapy today.  Treatment took place in water 3.5-4.75 ft in depth at the [facility] pool. Temp of water was 91.  Pt entered the pool via stairs using step to pattern with hand rail, exited via lift for pain management purposes  *Intro to setting *walking forward, back and side stepping with ue support of noodle 4.7 ft *cycling with support yellow noodle *marching forward ue support noodle *standing ue support  wall 4.3ft: df, pf; hip add/abd x 10; relaxed squats x 5 (right knee popped with discomfort ended exercise) *seated on lift: hip add/abd; knee flex/ext.  Pt requires the buoyancy and hydrostatic pressure of water for support, and to offload joints by unweighting joint load by at least 50 % in navel deep water and by at least 75-80% in chest to neck deep water.  Viscosity of the water is needed for resistance of strengthening. Water current perturbations provides challenge to standing balance requiring increased core activation.    09/16/23 Contract relax for bil HS 5 holds- ball placed under opp knee for support - STM to tibialis anterior in supine (ball under knee)  -passive stretching of tib anterior, HS -LAQ 2x10ea     PATIENT EDUCATION:  Education details: Discussed eval findings, rehab rationale, aquatic program progression/POC and pools in area. Patient is in agreement  Person educated: Patient Education method: Explanation Education comprehension: verbalized understanding  HOME EXERCISE PROGRAM: TBA  ASSESSMENT:  CLINICAL IMPRESSION: Came in today with increase in soreness following a trip.  Therapy worked on patient's range of motion.  The patient has improved since the last visit.  She continues to have  pain with end range extension.  With manual therapy patient demonstrated slight improvement in knee extension per visual inspection.  We also reviewed self patella mobilization and self stretching for home.  Began developing.  The patient program for home.  She was shown how to do quad sets and hip abduction for home.  She is also shown a gastroc and hamstring stretch for home that she can do to help improve self extension range of motion.  She feels like the pool is helping her ability to strengthen. Therapy will continue to expand HEP.    Eval: Patient is a 58 y.o. f who was seen today for physical therapy evaluation and treatment for bilat knee OA. She presents today with  husband assisting she is using a rollator.  May require wc assistance for therapy sessions.  Exam reveals significant bilateral knee flex contractures, limited knee flex, strength deficits, balance and proprioception deficits. Her gait is greatly impeded as she is unable to strike her heels and doesn't clear her feet from the floor/shuffles. She is a high fall risk due to posture and gait deficits.  She will benefit from skilled physical therapy both aquatic and land.  Will plan on focusing initially in aquatics on pain management, movement toleration improving ROM and strengthening then progress fairly quickly to alternating with land. Goals to improve all deficits which are significantly affecting her functional mobility and ADL's  OBJECTIVE IMPAIRMENTS: Abnormal gait, decreased activity tolerance, decreased balance, decreased coordination, decreased endurance, decreased knowledge of condition, decreased knowledge of use of DME, decreased mobility, difficulty walking, decreased ROM, decreased strength, impaired flexibility, postural dysfunction, obesity, and pain.   ACTIVITY LIMITATIONS: carrying, lifting, bending, sitting, standing, squatting, sleeping, stairs, transfers, hygiene/grooming, and locomotion level  PARTICIPATION LIMITATIONS: meal prep, cleaning, laundry, driving, shopping, community activity, occupation, and yard work  PERSONAL FACTORS:  Behavior pattern, Fitness, Past/current experiences, and Time since onset of injury/illness/exacerbation are also affecting patient's functional outcome.   REHAB POTENTIAL: Good  CLINICAL DECISION MAKING: Evolving/moderate complexity  EVALUATION COMPLEXITY: Moderate   GOALS: Goals reviewed with patient? Yes  SHORT TERM GOALS: Target date: 10/11/23 Pt will tolerate full aquatic sessions consistently without increase in pain and with improving function to demonstrate good toleration and effectiveness of intervention.  Baseline: Goal status:  INITIAL  2.  Pt will tolerate walking to and from setting and engaging in aquatic therapy session without excessive fatigue or increase in pain to demonstrate improved toleration to activity. Baseline: unable Goal status: INITIAL  3.  Pt will be indep with stair negotiation into and out of pool consistently using hand rail Baseline:  Goal status: INITIAL    4. Pt to verbalize understanding of the importance of exercise/movement on overall well being.    Baseline: immobile for up towards 1 year    Goal status: INITIAL   LONG TERM GOALS: Target date: 11/08/23  Pt to improve on LEFS by at least 9 point to demonstrate statistically significant Improvement in function. Baseline: 12/80 Goal status: INITIAL  2.  Pt will report decrease in pain by at least 50% for improved toleration to activity/quality of life and to demonstrate improved management of pain. Baseline:  Goal status: INITIAL  3.  Pt will improve strength in knee extension by at least 10 lbs to demonstrate improved overall physical function Baseline:  Goal status: INITIAL  4.  Pt to improve bilateral knee extension by 50% for improved gait. Baseline:  Goal status: INITIAL    5. Pt will be indep with final HEP's (land and aquatic as appropriate) for continued management of condition    Baseline:    Goal status: INITIAL   PLAN:  PT FREQUENCY: 2x/week  PT DURATION: 8 weeks  PLANNED INTERVENTIONS: 97164- PT Re-evaluation, 97110-Therapeutic exercises, 97530- Therapeutic activity, 97112- Neuromuscular re-education, 97535- Self Care, 16109- Manual therapy, [phone]- Gait training, [phone]- Orthotic Initial, [phone]- Aquatic Therapy, [phone]- Ionotophoresis 4mg /ml Dexamethasone , Patient/Family education, Balance training, Stair training, Taping, Dry Needling, Joint mobilization, DME instructions, Cryotherapy, and Moist heat  PLAN FOR NEXT SESSION: alternate land/aquatics for Knee ROM/strengthening; gait; balance an  proprioception  Adriana Hopping Laneta Pintos) Ziemba MPT 09/27/23 2:45 PM [facility] [address] [place], [place], [address] Phone: [phone]   Fax:  [phone]    Date of referral: 08/09/23 Referring provider: Meyer Ada MD Referring diagnosis? Bilat knee OA Treatment diagnosis? (if different than referring diagnosis) no  What was this (referring dx) caused by? Arthritis  Lonne Roan of Condition: Chronic (continuous duration > 3 months)   Laterality: Both  Current Functional Measure Score: LEFS 12/80  Objective measurements identify impairments when they are compared to normal values, the uninvolved extremity, and prior level of function.  [x]  Yes  []  No  Objective assessment of functional ability: Severe functional limitations   Briefly describe symptoms: Bilat knee pain, flexion contractures, gait abnormality, strength deficits  How did symptoms start: OA  Average pain intensity:  Last 24 hours: 3/10  Past week: 3/10  How often does the pt experience symptoms? Constantly  How much have the symptoms interfered with usual daily activities? Quite a bit  How has condition changed since care began at this facility? NA - initial visit  In general, how is the patients overall health? Good   BACK PAIN (STarT Back Screening Tool) No

## 2023-09-30 ENCOUNTER — Encounter (HOSPITAL_BASED_OUTPATIENT_CLINIC_OR_DEPARTMENT_OTHER): Payer: Self-pay | Admitting: Rehabilitative and Restorative Service Providers"

## 2023-09-30 ENCOUNTER — Ambulatory Visit (HOSPITAL_BASED_OUTPATIENT_CLINIC_OR_DEPARTMENT_OTHER): Admitting: Rehabilitative and Restorative Service Providers"

## 2023-09-30 DIAGNOSIS — R2689 Other abnormalities of gait and mobility: Secondary | ICD-10-CM

## 2023-09-30 DIAGNOSIS — G8929 Other chronic pain: Secondary | ICD-10-CM | POA: Diagnosis not present

## 2023-09-30 DIAGNOSIS — R293 Abnormal posture: Secondary | ICD-10-CM

## 2023-09-30 DIAGNOSIS — M6281 Muscle weakness (generalized): Secondary | ICD-10-CM

## 2023-09-30 DIAGNOSIS — M25562 Pain in left knee: Secondary | ICD-10-CM | POA: Diagnosis not present

## 2023-09-30 DIAGNOSIS — M25561 Pain in right knee: Secondary | ICD-10-CM | POA: Diagnosis not present

## 2023-09-30 LAB — URINE CULTURE

## 2023-09-30 NOTE — Therapy (Signed)
 OUTPATIENT PHYSICAL THERAPY LOWER EXTREMITY TREATMENT   Patient Name: [name] Wright MRN: [id number] DOB:09/09/1965, 58 y.o., female Today's Date: 09/30/2023  END OF SESSION:  PT End of Session - 09/30/23 0935     Visit Number 5    Date for PT Re-Evaluation 11/08/23    Authorization Type [employer] mcr approved 16 visits 6/3 - 7-29    PT Start Time 0935    PT Stop Time 1034    PT Time Calculation (min) 59 min    Activity Tolerance Patient tolerated treatment well;No increased pain    Behavior During Therapy [facility] for tasks assessed/performed             Past Medical History:  Diagnosis Date   Acute cystitis    Allergic rhinitis    Childhood asthma    no problems as adult - no inhaler   Chronic insomnia    Chronic pain due to trauma 02/2001   closed head trauma - Followed by Dr Elspeth Mems [facility]   Complication of anesthesia    Dyspepsia    Family history of ovarian cancer    Head trauma 02/24/2001   closed   History of kidney stones    passed stone - no surgery required   Hypertension    Hypothyroidism    PONV (postoperative nausea and vomiting)    Pre-diabetes    Sleep apnea    Pt denies   SVD (spontaneous vaginal delivery)    fetal demise at 5 months   Past Surgical History:  Procedure Laterality Date   APPENDECTOMY     BIOPSY  08/25/2020   Procedure: BIOPSY;  Surgeon: San Sandor GAILS, DO;  Location: [facility];  Service: Gastroenterology;;   COLONOSCOPY WITH PROPOFOL  N/A 08/25/2020   Procedure: COLONOSCOPY WITH PROPOFOL ;  Surgeon: San Sandor GAILS, DO;  Location: [facility];  Service: Gastroenterology;  Laterality: N/A;   DILATATION & CURETTAGE/HYSTEROSCOPY WITH MYOSURE N/A 04/15/2017   Procedure: DILATATION & CURETTAGE/HYSTEROSCOPY WITH MYOSURE POLYPECTOMY;  Surgeon: Curlene Agent, MD;  Location: [facility];  Service: Gynecology;  Laterality: N/A;   DILATATION & CURETTAGE/HYSTEROSCOPY WITH MYOSURE N/A 06/18/2019   Procedure:  DILATATION & CURETTAGE/HYSTEROSCOPY WITH  MYOSURE;  Surgeon: Curlene Agent, MD;  Location: [facility];  Service: Gynecology;  Laterality: N/A;   DILATATION & CURETTAGE/HYSTEROSCOPY WITH MYOSURE N/A 06/07/2023   Procedure: DILATATION & CURETTAGE/HYSTEROSCOPY WITH  MYOSURE;  Surgeon: Curlene Agent, MD;  Location: [facility] OR;  Service: Gynecology;  Laterality: N/A;  POSSIBLE MYOSURE [facility]   ESOPHAGOGASTRODUODENOSCOPY (EGD) WITH PROPOFOL  N/A 08/25/2020   Procedure: ESOPHAGOGASTRODUODENOSCOPY (EGD) WITH PROPOFOL ;  Surgeon: San Sandor GAILS, DO;  Location: [facility];  Service: Gastroenterology;  Laterality: N/A;   EYE SURGERY     cataracts removed   INCONTINENCE SURGERY     PILONIDAL CYST EXCISION     x2   POLYPECTOMY  08/25/2020   Procedure: POLYPECTOMY;  Surgeon: San Sandor GAILS, DO;  Location: [facility];  Service: Gastroenterology;;   repair of toe laceration     dermabond to right big toe right foot   repair of torn ligament right leg     patient denies this surgery   right foot fracture     cast only   TONSILLECTOMY     WISDOM TOOTH EXTRACTION     Patient Active Problem List   Diagnosis Date Noted   Urgency of urination 09/26/2023   Vertigo 08/19/2023   Swimmer's ear 08/19/2023   Rash and nonspecific skin eruption 08/19/2023  Chronic pain of both knees 08/17/2023   Thrush, oral 07/07/2023   Female climacteric state 05/14/2023   Pre-op exam 05/14/2023   Class 3 severe obesity due to excess calories with body mass index (BMI) of 50.0 to 59.9 in adult 03/25/2023   Primary hypothyroidism 03/25/2023   Otalgia, right ear 03/25/2023   Vitamin D  deficiency 02/06/2023   Vitamin B12 deficiency 02/06/2023   Urinary frequency 05/27/2022   Leukocytes in urine 05/27/2022   Costochondritis 05/14/2022   Genetic testing 03/08/2021   Family history of ovarian cancer 02/16/2021   Uncontrolled type 2 diabetes mellitus with hyperglycemia (HCC) 02/07/2021   Essential hypertension, benign  02/07/2021   Gastritis and gastroduodenitis    Nausea without vomiting    Adenomatous polyp of ascending colon    Adenomatous polyp of transverse colon    Adenomatous polyp of sigmoid colon    Rectal polyp    Adenomatous polyp of descending colon    Gall bladder pain 11/04/2019   Class 3 severe obesity due to excess calories with serious comorbidity and body mass index (BMI) of 60.0 to 69.9 in adult 08/13/2019   Nasal congestion 03/10/2019   Sinus pressure 03/10/2019   Hyperglycemia 01/22/2019   Hypertriglyceridemia 01/22/2019   Combined forms of age-related cataract of left eye 09/10/2017   Musculoskeletal neck pain 07/24/2017   Migraine without aura and without status migrainosus, not intractable 11/12/2016   Encounter for preventative adult health care exam with abnormal findings 09/01/2015   Sinusitis, chronic 09/01/2015   Cough 09/01/2015   Morbid obesity with BMI of 50.0-59.9, adult (HCC) 11/18/2014   RUQ pain 10/27/2014   Urinary incontinence 09/17/2013   Acute sinus infection 10/16/2012   Paresthesias 09/22/2012   Sleep apnea, obstructive 09/22/2012   Elevated BP 07/29/2012   Fatigue 07/10/2012   Anxiety 06/02/2012   Closed head injury 06/02/2012   Depression 06/02/2012   Facial pain 06/02/2012   Leg cramps 04/15/2011   Edema 12/21/2010   Chronic pain due to trauma 03/15/2007   Extrinsic asthma 03/15/2007   INSOMNIA, CHRONIC 11/01/2006   Allergic rhinitis 11/01/2006   DYSPEPSIA, CHRONIC 11/01/2006   Head injury 11/01/2006    PCP: Catheryn Slocumb MD  REFERRING PROVIDER:   Sheril Coy, MD    REFERRING DIAG: M17.0 (ICD-10-CM) - Degenerative arthritis of knee, bilateral   THERAPY DIAG:  Bilateral chronic knee pain  Muscle weakness (generalized)  Abnormal posture  Other abnormalities of gait and mobility  Rationale for Evaluation and Treatment: Rehabilitation  ONSET DATE: 1 year  SUBJECTIVE:   SUBJECTIVE STATEMENT:  Pt stated she was in pain  after the trip and being in the car for 16 hours; she states she had swelling too.  Moving of my knee caps hurt Friday.   eval: Want to avoid having TKR.  Bone spurs are on the back of my knee caps.  Twisted my knee about 1 years ago.  Have been using rollator on and off for past 6 months. Can't straighten my knee. Gel shots have helped.  Just had injections last week.  Have a few more scheduled.  Had a surgery in last few months and I have been somewhat immobile not moving much at all.. Sleep for about 4 hours before waking  PERTINENT HISTORY: TBI 2008/2014 PAIN:  Are you having pain? Yes: NPRS scale: current 3-4/10; worst 8/10; least 2/10 Pain location: bilat knee joint Pain description: ache Aggravating factors: weight bearing Relieving factors: lidocaine  patches, OTC meds, mag cream  PRECAUTIONS: None  RED FLAGS: None   WEIGHT BEARING RESTRICTIONS: No  FALLS:  Has patient fallen in last 6 months? No tripped walking into the house  LIVING ENVIRONMENT: Lives with: lives with their family Lives in: House/apartment Stairs: 6 step into home Has following equipment at home: Single point cane and rollator and electric scooterlift chair, shower bench  OCCUPATION: disables  PLOF: Independent uses shower benches  PATIENT GOALS: build stamina, increase strength  NEXT MD VISIT: Friday  OBJECTIVE:  Note: Objective measures were completed at Evaluation unless otherwise noted.  DIAGNOSTIC FINDINGS: none in chart  PATIENT SURVEYS:  LEFS 12/80  COGNITION: Overall cognitive status: Within functional limits for tasks assessed     SENSATION: WFL   MUSCLE LENGTH: Hamstrings:shortened bilaterally   POSTURE: rounded shoulders, forward head, and knee flexed trunk forward flexed.  PALPATION: Moderate TTP about knees bilat joint lines medial> Lateral  LOWER EXTREMITY ROM:  Active ROM Right eval Left eval  Hip flexion    Hip extension    Hip abduction    Hip adduction     Hip internal rotation    Hip external rotation    Knee flexion 100d 103  Knee extension -40 -34  Ankle dorsiflexion    Ankle plantarflexion    Ankle inversion    Ankle eversion     (Blank rows = not tested)  LOWER EXTREMITY MMT:  HD (lb) Tested in sitting Right eval Left eval  Hip flexion 44.4 45.9  Hip extension    Hip abduction 27.1 25.9  Hip adduction    Hip internal rotation    Hip external rotation    Knee flexion    Knee extension 33.1 25.6  Ankle dorsiflexion    Ankle plantarflexion    Ankle inversion    Ankle eversion     (Blank rows = not tested)   FUNCTIONAL TESTS:  Timed up and go (TUG): 23.91 STS transfers: heavy ue support from pool bench; forward momentum with ue elevated   GAIT: Distance walked: 100 ft Assistive device utilized: rollator Level of assistance: SBA Comments: bilateral flex contractures: no heel strike, sliding on toes when advancing LE during swing, forward flex trunk posture                                                                                                                                TREATMENT  09/30/23 SAQ unilat x 15 Quad set off bolster x 15 Iso SAQ with ankle pump x 15 Hamstring digs x 20  Ball squeeze x 20 Ball squeeze with bridge x 20 SLR x 15 STW/deep tissue work as tolerated to [employer] Tib and [employer] bil Manual Gastroc/Ant Tib stretch bil  6/20  Manual:  Trigger point release to bilateral hamstringing  Extension stretching with distraction  Patella mobilization  Grade II and III PA and AP mobilization   There-ex:  Quad set 2x8 with cuing for technique and home set up  Hamstring stretch 2x30 sec bilateral  Gastroc stretch seated 2x30 sec hold     09/17/23 [facility] Adult PT Treatment:                                                DATE: 09/17/23 Pt seen for aquatic therapy today.  Treatment took place in water 3.5-4.75 ft in depth at the [facility] pool. Temp of water was 91.  Pt  entered the pool via stairs using step to pattern with hand rail, exited via lift for pain management purposes  *Intro to setting *walking forward, back and side stepping with ue support of noodle 4.7 ft *cycling with support yellow noodle *marching forward ue support noodle *standing ue support wall 4.33ft: df, pf; hip add/abd x 10; relaxed squats x 5 (right knee popped with discomfort ended exercise) *seated on lift: hip add/abd; knee flex/ext.  Pt requires the buoyancy and hydrostatic pressure of water for support, and to offload joints by unweighting joint load by at least 50 % in navel deep water and by at least 75-80% in chest to neck deep water.  Viscosity of the water is needed for resistance of strengthening. Water current perturbations provides challenge to standing balance requiring increased core activation.    09/16/23 Contract relax for bil HS 5 holds- ball placed under opp knee for support - STM to tibialis anterior in supine (ball under knee)  -passive stretching of tib anterior, HS -LAQ 2x10ea     PATIENT EDUCATION:  Education details: Discussed eval findings, rehab rationale, aquatic program progression/POC and pools in area. Patient is in agreement  Person educated: Patient Education method: Explanation Education comprehension: verbalized understanding  HOME EXERCISE PROGRAM: TBA  ASSESSMENT:  CLINICAL IMPRESSION: Came in today with increase in soreness following a trip.  Therapy focused on strength and manual. She continues to have  pain with end range extension. She is tender medial knee joint line and along Ant Tib where several tight bands are palpated. She feels like the pool is helping her ability to strengthen. Therapy will continue to expand HEP.    Eval: Patient is a 58 y.o. f who was seen today for physical therapy evaluation and treatment for bilat knee OA. She presents today with husband assisting she is using a rollator.  May require wc assistance  for therapy sessions.  Exam reveals significant bilateral knee flex contractures, limited knee flex, strength deficits, balance and proprioception deficits. Her gait is greatly impeded as she is unable to strike her heels and doesn't clear her feet from the floor/shuffles. She is a high fall risk due to posture and gait deficits.  She will benefit from skilled physical therapy both aquatic and land.  Will plan on focusing initially in aquatics on pain management, movement toleration improving ROM and strengthening then progress fairly quickly to alternating with land. Goals to improve all deficits which are significantly affecting her functional mobility and ADL's  OBJECTIVE IMPAIRMENTS: Abnormal gait, decreased activity tolerance, decreased balance, decreased coordination, decreased endurance, decreased knowledge of condition, decreased knowledge of use of DME, decreased mobility, difficulty walking, decreased ROM, decreased strength, impaired flexibility, postural dysfunction, obesity, and pain.   ACTIVITY LIMITATIONS: carrying, lifting, bending, sitting, standing, squatting, sleeping, stairs, transfers, hygiene/grooming, and locomotion level  PARTICIPATION LIMITATIONS: meal prep, cleaning, laundry, driving, shopping, community activity, occupation, and yard work  PERSONAL FACTORS:  Behavior pattern, Fitness, Past/current experiences, and Time since onset of injury/illness/exacerbation are also affecting patient's functional outcome.   REHAB POTENTIAL: Good  CLINICAL DECISION MAKING: Evolving/moderate complexity  EVALUATION COMPLEXITY: Moderate   GOALS: Goals reviewed with patient? Yes  SHORT TERM GOALS: Target date: 10/11/23 Pt will tolerate full aquatic sessions consistently without increase in pain and with improving function to demonstrate good toleration and effectiveness of intervention.  Baseline: Goal status: INITIAL  2.  Pt will tolerate walking to and from setting and engaging in  aquatic therapy session without excessive fatigue or increase in pain to demonstrate improved toleration to activity. Baseline: unable Goal status: INITIAL  3.  Pt will be indep with stair negotiation into and out of pool consistently using hand rail Baseline:  Goal status: INITIAL    4. Pt to verbalize understanding of the importance of exercise/movement on overall well being.    Baseline: immobile for up towards 1 year    Goal status: INITIAL   LONG TERM GOALS: Target date: 11/08/23  Pt to improve on LEFS by at least 9 point to demonstrate statistically significant Improvement in function. Baseline: 12/80 Goal status: INITIAL  2.  Pt will report decrease in pain by at least 50% for improved toleration to activity/quality of life and to demonstrate improved management of pain. Baseline:  Goal status: INITIAL  3.  Pt will improve strength in knee extension by at least 10 lbs to demonstrate improved overall physical function Baseline:  Goal status: INITIAL  4.  Pt to improve bilateral knee extension by 50% for improved gait. Baseline:  Goal status: INITIAL    5. Pt will be indep with final HEP's (land and aquatic as appropriate) for continued management of condition    Baseline:    Goal status: INITIAL   PLAN:  PT FREQUENCY: 2x/week  PT DURATION: 8 weeks  PLANNED INTERVENTIONS: 97164- PT Re-evaluation, 97110-Therapeutic exercises, 97530- Therapeutic activity, [id number]- Neuromuscular re-education, 97535- Self Care, [id number]- Manual therapy, [id number]- Gait training, [phone]- Orthotic Initial, [phone]- Aquatic Therapy, [phone]- Ionotophoresis 4mg /ml Dexamethasone , Patient/Family education, Balance training, Stair training, Taping, Dry Needling, Joint mobilization, DME instructions, Cryotherapy, and Moist heat  PLAN FOR NEXT SESSION: alternate land/aquatics for Knee ROM/strengthening; gait; balance an proprioception; Review HEP  Ronal Foots) Ziemba MPT 09/30/23 10:39 AM [facility] [address] [place], [place], [address] Phone: [phone]   Fax:  [phone]    Date of referral: 08/09/23 Referring provider: Maude Graves MD Referring diagnosis? Bilat knee OA Treatment diagnosis? (if different than referring diagnosis) no  What was this (referring dx) caused by? Arthritis  Lysle of Condition: Chronic (continuous duration > 3 months)   Laterality: Both  Current Functional Measure Score: LEFS 12/80  Objective measurements identify impairments when they are compared to normal values, the uninvolved extremity, and prior level of function.  [x]  Yes  []  No  Objective assessment of functional ability: Severe functional limitations   Briefly describe symptoms: Bilat knee pain, flexion contractures, gait abnormality, strength deficits  How did symptoms start: OA  Average pain intensity:  Last 24 hours: 3/10  Past week: 3/10  How often does the pt experience symptoms? Constantly  How much have the symptoms interfered with usual daily activities? Quite a bit  How has condition changed since care began at this facility? NA - initial visit  In general, how is the patients overall health? Good   BACK PAIN (STarT Back Screening Tool) No

## 2023-10-02 ENCOUNTER — Ambulatory Visit (HOSPITAL_BASED_OUTPATIENT_CLINIC_OR_DEPARTMENT_OTHER): Admitting: Physical Therapy

## 2023-10-02 ENCOUNTER — Encounter (HOSPITAL_BASED_OUTPATIENT_CLINIC_OR_DEPARTMENT_OTHER): Payer: Self-pay | Admitting: Physical Therapy

## 2023-10-02 DIAGNOSIS — M25561 Pain in right knee: Secondary | ICD-10-CM | POA: Diagnosis not present

## 2023-10-02 DIAGNOSIS — G8929 Other chronic pain: Secondary | ICD-10-CM | POA: Diagnosis not present

## 2023-10-02 DIAGNOSIS — R293 Abnormal posture: Secondary | ICD-10-CM

## 2023-10-02 DIAGNOSIS — R2689 Other abnormalities of gait and mobility: Secondary | ICD-10-CM | POA: Diagnosis not present

## 2023-10-02 DIAGNOSIS — M25562 Pain in left knee: Secondary | ICD-10-CM | POA: Diagnosis not present

## 2023-10-02 DIAGNOSIS — M6281 Muscle weakness (generalized): Secondary | ICD-10-CM | POA: Diagnosis not present

## 2023-10-02 NOTE — Therapy (Signed)
 OUTPATIENT PHYSICAL THERAPY LOWER EXTREMITY TREATMENT   Patient Name: [name] Wright MRN: [id number] DOB:1966-03-03, 58 y.o., female Today's Date: 10/02/2023  END OF SESSION:  PT End of Session - 10/02/23 0930     Visit Number 6    Date for PT Re-Evaluation 11/08/23    Authorization Type [employer] mcr approved 16 visits 6/3 - 7-29    PT Start Time 0930    PT Stop Time 1008    PT Time Calculation (min) 38 min    Behavior During Therapy [facility] for tasks assessed/performed             Past Medical History:  Diagnosis Date   Acute cystitis    Allergic rhinitis    Childhood asthma    no problems as adult - no inhaler   Chronic insomnia    Chronic pain due to trauma 02/2001   closed head trauma - Followed by Dr Elspeth Mems [facility]   Complication of anesthesia    Dyspepsia    Family history of ovarian cancer    Head trauma 02/24/2001   closed   History of kidney stones    passed stone - no surgery required   Hypertension    Hypothyroidism    PONV (postoperative nausea and vomiting)    Pre-diabetes    Sleep apnea    Pt denies   SVD (spontaneous vaginal delivery)    fetal demise at 5 months   Past Surgical History:  Procedure Laterality Date   APPENDECTOMY     BIOPSY  08/25/2020   Procedure: BIOPSY;  Surgeon: San Sandor GAILS, DO;  Location: [facility];  Service: Gastroenterology;;   COLONOSCOPY WITH PROPOFOL  N/A 08/25/2020   Procedure: COLONOSCOPY WITH PROPOFOL ;  Surgeon: San Sandor GAILS, DO;  Location: [facility];  Service: Gastroenterology;  Laterality: N/A;   DILATATION & CURETTAGE/HYSTEROSCOPY WITH MYOSURE N/A 04/15/2017   Procedure: DILATATION & CURETTAGE/HYSTEROSCOPY WITH MYOSURE POLYPECTOMY;  Surgeon: Curlene Agent, MD;  Location: [facility];  Service: Gynecology;  Laterality: N/A;   DILATATION & CURETTAGE/HYSTEROSCOPY WITH MYOSURE N/A 06/18/2019   Procedure: DILATATION & CURETTAGE/HYSTEROSCOPY WITH  MYOSURE;  Surgeon: Curlene Agent, MD;   Location: [facility];  Service: Gynecology;  Laterality: N/A;   DILATATION & CURETTAGE/HYSTEROSCOPY WITH MYOSURE N/A 06/07/2023   Procedure: DILATATION & CURETTAGE/HYSTEROSCOPY WITH  MYOSURE;  Surgeon: Curlene Agent, MD;  Location: [facility] OR;  Service: Gynecology;  Laterality: N/A;  POSSIBLE MYOSURE [facility]   ESOPHAGOGASTRODUODENOSCOPY (EGD) WITH PROPOFOL  N/A 08/25/2020   Procedure: ESOPHAGOGASTRODUODENOSCOPY (EGD) WITH PROPOFOL ;  Surgeon: San Sandor GAILS, DO;  Location: [facility];  Service: Gastroenterology;  Laterality: N/A;   EYE SURGERY     cataracts removed   INCONTINENCE SURGERY     PILONIDAL CYST EXCISION     x2   POLYPECTOMY  08/25/2020   Procedure: POLYPECTOMY;  Surgeon: San Sandor GAILS, DO;  Location: [facility];  Service: Gastroenterology;;   repair of toe laceration     dermabond to right big toe right foot   repair of torn ligament right leg     patient denies this surgery   right foot fracture     cast only   TONSILLECTOMY     WISDOM TOOTH EXTRACTION     Patient Active Problem List   Diagnosis Date Noted   Urgency of urination 09/26/2023   Vertigo 08/19/2023   Swimmer's ear 08/19/2023   Rash and nonspecific skin eruption 08/19/2023   Chronic pain of both knees 08/17/2023   Thrush, oral 07/07/2023  Female climacteric state 05/14/2023   Pre-op exam 05/14/2023   Class 3 severe obesity due to excess calories with body mass index (BMI) of 50.0 to 59.9 in adult 03/25/2023   Primary hypothyroidism 03/25/2023   Otalgia, right ear 03/25/2023   Vitamin D  deficiency 02/06/2023   Vitamin B12 deficiency 02/06/2023   Urinary frequency 05/27/2022   Leukocytes in urine 05/27/2022   Costochondritis 05/14/2022   Genetic testing 03/08/2021   Family history of ovarian cancer 02/16/2021   Uncontrolled type 2 diabetes mellitus with hyperglycemia (HCC) 02/07/2021   Essential hypertension, benign 02/07/2021   Gastritis and gastroduodenitis    Nausea without vomiting    Adenomatous  polyp of ascending colon    Adenomatous polyp of transverse colon    Adenomatous polyp of sigmoid colon    Rectal polyp    Adenomatous polyp of descending colon    Gall bladder pain 11/04/2019   Class 3 severe obesity due to excess calories with serious comorbidity and body mass index (BMI) of 60.0 to 69.9 in adult 08/13/2019   Nasal congestion 03/10/2019   Sinus pressure 03/10/2019   Hyperglycemia 01/22/2019   Hypertriglyceridemia 01/22/2019   Combined forms of age-related cataract of left eye 09/10/2017   Musculoskeletal neck pain 07/24/2017   Migraine without aura and without status migrainosus, not intractable 11/12/2016   Encounter for preventative adult health care exam with abnormal findings 09/01/2015   Sinusitis, chronic 09/01/2015   Cough 09/01/2015   Morbid obesity with BMI of 50.0-59.9, adult (HCC) 11/18/2014   RUQ pain 10/27/2014   Urinary incontinence 09/17/2013   Acute sinus infection 10/16/2012   Paresthesias 09/22/2012   Sleep apnea, obstructive 09/22/2012   Elevated BP 07/29/2012   Fatigue 07/10/2012   Anxiety 06/02/2012   Closed head injury 06/02/2012   Depression 06/02/2012   Facial pain 06/02/2012   Leg cramps 04/15/2011   Edema 12/21/2010   Chronic pain due to trauma 03/15/2007   Extrinsic asthma 03/15/2007   INSOMNIA, CHRONIC 11/01/2006   Allergic rhinitis 11/01/2006   DYSPEPSIA, CHRONIC 11/01/2006   Head injury 11/01/2006    PCP: Catheryn Slocumb MD  REFERRING PROVIDER:   Sheril Coy, MD    REFERRING DIAG: M17.0 (ICD-10-CM) - Degenerative arthritis of knee, bilateral   THERAPY DIAG:  Bilateral chronic knee pain  Muscle weakness (generalized)  Abnormal posture  Rationale for Evaluation and Treatment: Rehabilitation  ONSET DATE: 1 year  SUBJECTIVE:   SUBJECTIVE STATEMENT:  Monday went well (last land treatment).  Pt reports that she walked with rollator from lobby to pool with 2 rest breaks.   POOL ACCESS: currently none.   Has  done aquatic therapy in the past.   eval: Want to avoid having TKR.  Bone spurs are on the back of my knee caps.  Twisted my knee about 1 years ago.  Have been using rollator on and off for past 6 months. Can't straighten my knee. Gel shots have helped.  Just had injections last week.  Have a few more scheduled.  Had a surgery in last few months and I have been somewhat immobile not moving much at all.. Sleep for about 4 hours before waking  PERTINENT HISTORY: TBI 2008/2014 PAIN:  Are you having pain? Yes: NPRS scale: current 4/10 Pain location: bilat knee joint Pain description: ache Aggravating factors: weight bearing Relieving factors: lidocaine  patches, OTC meds, mag cream  PRECAUTIONS: None  RED FLAGS: None   WEIGHT BEARING RESTRICTIONS: No  FALLS:  Has patient fallen in last 6  months? No tripped walking into the house  LIVING ENVIRONMENT: Lives with: lives with their family Lives in: House/apartment Stairs: 6 step into home Has following equipment at home: Single point cane and rollator and electric scooterlift chair, shower bench  OCCUPATION: disables  PLOF: Independent uses shower benches  PATIENT GOALS: build stamina, increase strength  NEXT MD VISIT: Friday  OBJECTIVE:  Note: Objective measures were completed at Evaluation unless otherwise noted.  DIAGNOSTIC FINDINGS: none in chart  PATIENT SURVEYS:  LEFS 12/80  COGNITION: Overall cognitive status: Within functional limits for tasks assessed     SENSATION: WFL   MUSCLE LENGTH: Hamstrings:shortened bilaterally   POSTURE: rounded shoulders, forward head, and knee flexed trunk forward flexed.  PALPATION: Moderate TTP about knees bilat joint lines medial> Lateral  LOWER EXTREMITY ROM:  Active ROM Right eval Left eval  Hip flexion    Hip extension    Hip abduction    Hip adduction    Hip internal rotation    Hip external rotation    Knee flexion 100d 103  Knee extension -40 -34  Ankle  dorsiflexion    Ankle plantarflexion    Ankle inversion    Ankle eversion     (Blank rows = not tested)  LOWER EXTREMITY MMT:  HD (lb) Tested in sitting Right eval Left eval  Hip flexion 44.4 45.9  Hip extension    Hip abduction 27.1 25.9  Hip adduction    Hip internal rotation    Hip external rotation    Knee flexion    Knee extension 33.1 25.6  Ankle dorsiflexion    Ankle plantarflexion    Ankle inversion    Ankle eversion     (Blank rows = not tested)   FUNCTIONAL TESTS:  Timed up and go (TUG): 23.91 STS transfers: heavy ue support from pool bench; forward momentum with ue elevated   GAIT: Distance walked: 100 ft Assistive device utilized: rollator Level of assistance: SBA Comments: bilateral flex contractures: no heel strike, sliding on toes when advancing LE during swing, forward flex trunk posture                                                                                                                                TREATMENT  [facility] Adult PT Treatment:                                                DATE: 10/02/23 Pt seen for aquatic therapy today.  Treatment took place in water 3.5-4.75 ft in depth at the [facility] pool. Temp of water was 91.  Pt entered and exited the pool via stairs using step-to pattern with heavy UE on hand rail.  * UE on barbell: walking forward/backward  * UE on barbell: side stepping -> with arm addct/ abdct  with yellow hand floats * walking forward with reciprocal arm swing with light resistance bells x 3 laps (cues for heel strike and flexed knee during swing through) and backwards  *cycling with support yellow noodle (under arms) - not tolerated in right knee - stopped *standing UE support wall 4.105ft: heel/toe raises x 10; hip add/abd x 10; relaxed squats x 5; leg swings into hip flex/ext x 5  * UE on corner in deeper water (no floatation support):  cycling (no pain) * R/L hamstring stretch with heel on 2nd step x 15s x  2 each LE   09/30/23 SAQ unilat x 15 Quad set off bolster x 15 Iso SAQ with ankle pump x 15 Hamstring digs x 20  Ball squeeze x 20 Ball squeeze with bridge x 20 SLR x 15 STW/deep tissue work as tolerated to [employer] Tib and [employer] bil Manual Gastroc/Ant Tib stretch bil  6/20  Manual:  Trigger point release to bilateral hamstringing  Extension stretching with distraction  Patella mobilization  Grade II and III PA and AP mobilization   There-ex:  Quad set 2x8 with cuing for technique and home set up  Hamstring stretch 2x30 sec bilateral  Gastroc stretch seated 2x30 sec hold     09/17/23 [facility] Adult PT Treatment:                                                DATE: 09/17/23 Pt seen for aquatic therapy today.  Treatment took place in water 3.5-4.75 ft in depth at the [facility] pool. Temp of water was 91.  Pt entered the pool via stairs using step to pattern with hand rail, exited via lift for pain management purposes  *Intro to setting *walking forward, back and side stepping with ue support of noodle 4.7 ft *cycling with support yellow noodle *marching forward ue support noodle *standing ue support wall 4.52ft: df, pf; hip add/abd x 10; relaxed squats x 5 (right knee popped with discomfort ended exercise) *seated on lift: hip add/abd; knee flex/ext.  Pt requires the buoyancy and hydrostatic pressure of water for support, and to offload joints by unweighting joint load by at least 50 % in navel deep water and by at least 75-80% in chest to neck deep water.  Viscosity of the water is needed for resistance of strengthening. Water current perturbations provides challenge to standing balance requiring increased core activation.    09/16/23 Contract relax for bil HS 5 holds- ball placed under opp knee for support - STM to tibialis anterior in supine (ball under knee)  -passive stretching of tib anterior, HS -LAQ 2x10ea     PATIENT EDUCATION:  Education details:  Discussed eval findings, rehab rationale, aquatic program progression/POC and pools in area. Patient is in agreement  Person educated: Patient Education method: Explanation Education comprehension: verbalized understanding  HOME EXERCISE PROGRAM: TBA  ASSESSMENT:  CLINICAL IMPRESSION: Pt initially complained of increased R knee pain with cycling with support of noodle under arms; no pain when UE on wall with no floatation.  She continues to have  pain in R knee with end range extension. Will continue to progress as tolerated.  Encouraged pt to seek pool outside of therapy session to work on aquatic exercises.  Goals are ongoing.    Eval: Patient is a 58 y.o. f who was seen today for  physical therapy evaluation and treatment for bilat knee OA. She presents today with husband assisting she is using a rollator.  May require wc assistance for therapy sessions.  Exam reveals significant bilateral knee flex contractures, limited knee flex, strength deficits, balance and proprioception deficits. Her gait is greatly impeded as she is unable to strike her heels and doesn't clear her feet from the floor/shuffles. She is a high fall risk due to posture and gait deficits.  She will benefit from skilled physical therapy both aquatic and land.  Will plan on focusing initially in aquatics on pain management, movement toleration improving ROM and strengthening then progress fairly quickly to alternating with land. Goals to improve all deficits which are significantly affecting her functional mobility and ADL's  OBJECTIVE IMPAIRMENTS: Abnormal gait, decreased activity tolerance, decreased balance, decreased coordination, decreased endurance, decreased knowledge of condition, decreased knowledge of use of DME, decreased mobility, difficulty walking, decreased ROM, decreased strength, impaired flexibility, postural dysfunction, obesity, and pain.   ACTIVITY LIMITATIONS: carrying, lifting, bending, sitting, standing,  squatting, sleeping, stairs, transfers, hygiene/grooming, and locomotion level  PARTICIPATION LIMITATIONS: meal prep, cleaning, laundry, driving, shopping, community activity, occupation, and yard work  PERSONAL FACTORS: Behavior pattern, Fitness, Past/current experiences, and Time since onset of injury/illness/exacerbation are also affecting patient's functional outcome.   REHAB POTENTIAL: Good  CLINICAL DECISION MAKING: Evolving/moderate complexity  EVALUATION COMPLEXITY: Moderate   GOALS: Goals reviewed with patient? Yes  SHORT TERM GOALS: Target date: 10/11/23 Pt will tolerate full aquatic sessions consistently without increase in pain and with improving function to demonstrate good toleration and effectiveness of intervention.  Baseline: Goal status: INITIAL  2.  Pt will tolerate walking to and from setting and engaging in aquatic therapy session without excessive fatigue or increase in pain to demonstrate improved toleration to activity. Baseline: unable Goal status: INITIAL  3.  Pt will be indep with stair negotiation into and out of pool consistently using hand rail Baseline:  Goal status: INITIAL    4. Pt to verbalize understanding of the importance of exercise/movement on overall well being.    Baseline: immobile for up towards 1 year    Goal status: INITIAL   LONG TERM GOALS: Target date: 11/08/23  Pt to improve on LEFS by at least 9 point to demonstrate statistically significant Improvement in function. Baseline: 12/80 Goal status: INITIAL  2.  Pt will report decrease in pain by at least 50% for improved toleration to activity/quality of life and to demonstrate improved management of pain. Baseline:  Goal status: INITIAL  3.  Pt will improve strength in knee extension by at least 10 lbs to demonstrate improved overall physical function Baseline:  Goal status: INITIAL  4.  Pt to improve bilateral knee extension by 50% for improved gait. Baseline:  Goal status:  INITIAL    5. Pt will be indep with final HEP's (land and aquatic as appropriate) for continued management of condition    Baseline:    Goal status: INITIAL   PLAN:  PT FREQUENCY: 2x/week  PT DURATION: 8 weeks  PLANNED INTERVENTIONS: 97164- PT Re-evaluation, 97110-Therapeutic exercises, 97530- Therapeutic activity, [id number]- Neuromuscular re-education, 97535- Self Care, [id number]- Manual therapy, [id number]- Gait training, [phone]- Orthotic Initial, [phone]- Aquatic Therapy, [phone]- Ionotophoresis 4mg /ml Dexamethasone , Patient/Family education, Balance training, Stair training, Taping, Dry Needling, Joint mobilization, DME instructions, Cryotherapy, and Moist heat  PLAN FOR NEXT SESSION: alternate land/aquatics for Knee ROM/strengthening; gait; balance an proprioception; Review HEP  Delon Aquas, PTA 10/02/23 10:38 AM [facility] [address] [place], [place], [address] Phone: [phone]   Fax:  [phone]   Date of referral: 08/09/23 Referring provider: Maude Graves MD Referring diagnosis? Bilat knee OA Treatment diagnosis? (if different than referring diagnosis) no  What was this (referring dx) caused by? Arthritis  Lysle of Condition: Chronic (continuous duration > 3 months)   Laterality: Both  Current Functional Measure Score: LEFS 12/80  Objective measurements identify impairments when they are compared to normal values, the uninvolved extremity, and prior level of function.  [x]  Yes  []  No  Objective assessment of functional ability: Severe functional limitations   Briefly describe symptoms: Bilat knee pain, flexion contractures, gait abnormality, strength deficits  How did symptoms start: OA  Average pain intensity:  Last 24 hours: 3/10  Past week: 3/10  How often does the pt experience symptoms? Constantly  How much have the symptoms interfered with usual daily activities? Quite a bit  How has condition  changed since care began at this facility? NA - initial visit  In general, how is the patients overall health? Good   BACK PAIN (STarT Back Screening Tool) No

## 2023-10-07 ENCOUNTER — Encounter: Payer: Self-pay | Admitting: Internal Medicine

## 2023-10-08 ENCOUNTER — Ambulatory Visit (HOSPITAL_BASED_OUTPATIENT_CLINIC_OR_DEPARTMENT_OTHER): Attending: Orthopaedic Surgery | Admitting: Physical Therapy

## 2023-10-08 ENCOUNTER — Encounter (HOSPITAL_BASED_OUTPATIENT_CLINIC_OR_DEPARTMENT_OTHER): Payer: Self-pay | Admitting: Physical Therapy

## 2023-10-08 DIAGNOSIS — M25562 Pain in left knee: Secondary | ICD-10-CM | POA: Insufficient documentation

## 2023-10-08 DIAGNOSIS — R2689 Other abnormalities of gait and mobility: Secondary | ICD-10-CM | POA: Insufficient documentation

## 2023-10-08 DIAGNOSIS — M6281 Muscle weakness (generalized): Secondary | ICD-10-CM | POA: Diagnosis not present

## 2023-10-08 DIAGNOSIS — G8929 Other chronic pain: Secondary | ICD-10-CM | POA: Diagnosis not present

## 2023-10-08 DIAGNOSIS — M25561 Pain in right knee: Secondary | ICD-10-CM | POA: Diagnosis not present

## 2023-10-08 DIAGNOSIS — R293 Abnormal posture: Secondary | ICD-10-CM | POA: Insufficient documentation

## 2023-10-08 NOTE — Therapy (Signed)
 OUTPATIENT PHYSICAL THERAPY LOWER EXTREMITY TREATMENT   Patient Name: [name] Wright MRN: [id number] DOB:26-Jul-1965, 58 y.o., female Today's Date: 10/08/2023  END OF SESSION:  PT End of Session - 10/08/23 1051     Visit Number 7    Date for PT Re-Evaluation 11/08/23    Authorization Type [employer] mcr approved 16 visits 6/3 - 7-29    PT Start Time 1053    PT Stop Time 1133    PT Time Calculation (min) 40 min    Activity Tolerance Patient tolerated treatment well    Behavior During Therapy WFL for tasks assessed/performed             Past Medical History:  Diagnosis Date   Acute cystitis    Allergic rhinitis    Childhood asthma    no problems as adult - no inhaler   Chronic insomnia    Chronic pain due to trauma 02/2001   closed head trauma - Followed by Dr Elspeth Mems [facility]   Complication of anesthesia    Dyspepsia    Family history of ovarian cancer    Head trauma 02/24/2001   closed   History of kidney stones    passed stone - no surgery required   Hypertension    Hypothyroidism    PONV (postoperative nausea and vomiting)    Pre-diabetes    Sleep apnea    Pt denies   SVD (spontaneous vaginal delivery)    fetal demise at 5 months   Past Surgical History:  Procedure Laterality Date   APPENDECTOMY     BIOPSY  08/25/2020   Procedure: BIOPSY;  Surgeon: San Sandor GAILS, DO;  Location: [facility];  Service: Gastroenterology;;   COLONOSCOPY WITH PROPOFOL  N/A 08/25/2020   Procedure: COLONOSCOPY WITH PROPOFOL ;  Surgeon: San Sandor GAILS, DO;  Location: [facility];  Service: Gastroenterology;  Laterality: N/A;   DILATATION & CURETTAGE/HYSTEROSCOPY WITH MYOSURE N/A 04/15/2017   Procedure: DILATATION & CURETTAGE/HYSTEROSCOPY WITH MYOSURE POLYPECTOMY;  Surgeon: Curlene Agent, MD;  Location: [facility];  Service: Gynecology;  Laterality: N/A;   DILATATION & CURETTAGE/HYSTEROSCOPY WITH MYOSURE N/A 06/18/2019   Procedure: DILATATION &  CURETTAGE/HYSTEROSCOPY WITH  MYOSURE;  Surgeon: Curlene Agent, MD;  Location: [facility];  Service: Gynecology;  Laterality: N/A;   DILATATION & CURETTAGE/HYSTEROSCOPY WITH MYOSURE N/A 06/07/2023   Procedure: DILATATION & CURETTAGE/HYSTEROSCOPY WITH  MYOSURE;  Surgeon: Curlene Agent, MD;  Location: [facility];  Service: Gynecology;  Laterality: N/A;  POSSIBLE MYOSURE [facility]   ESOPHAGOGASTRODUODENOSCOPY (EGD) WITH PROPOFOL  N/A 08/25/2020   Procedure: ESOPHAGOGASTRODUODENOSCOPY (EGD) WITH PROPOFOL ;  Surgeon: San Sandor GAILS, DO;  Location: [facility];  Service: Gastroenterology;  Laterality: N/A;   EYE SURGERY     cataracts removed   INCONTINENCE SURGERY     PILONIDAL CYST EXCISION     x2   POLYPECTOMY  08/25/2020   Procedure: POLYPECTOMY;  Surgeon: San Sandor GAILS, DO;  Location: [facility];  Service: Gastroenterology;;   repair of toe laceration     dermabond to right big toe right foot   repair of torn ligament right leg     patient denies this surgery   right foot fracture     cast only   TONSILLECTOMY     WISDOM TOOTH EXTRACTION     Patient Active Problem List   Diagnosis Date Noted   Urgency of urination 09/26/2023   Vertigo 08/19/2023   Swimmer's ear 08/19/2023   Rash and nonspecific skin eruption 08/19/2023   Chronic pain  of both knees 08/17/2023   Thrush, oral 07/07/2023   Female climacteric state 05/14/2023   Pre-op exam 05/14/2023   Class 3 severe obesity due to excess calories with body mass index (BMI) of 50.0 to 59.9 in adult 03/25/2023   Primary hypothyroidism 03/25/2023   Otalgia, right ear 03/25/2023   Vitamin D  deficiency 02/06/2023   Vitamin B12 deficiency 02/06/2023   Urinary frequency 05/27/2022   Leukocytes in urine 05/27/2022   Costochondritis 05/14/2022   Genetic testing 03/08/2021   Family history of ovarian cancer 02/16/2021   Uncontrolled type 2 diabetes mellitus with hyperglycemia (HCC) 02/07/2021   Essential hypertension, benign 02/07/2021    Gastritis and gastroduodenitis    Nausea without vomiting    Adenomatous polyp of ascending colon    Adenomatous polyp of transverse colon    Adenomatous polyp of sigmoid colon    Rectal polyp    Adenomatous polyp of descending colon    Gall bladder pain 11/04/2019   Class 3 severe obesity due to excess calories with serious comorbidity and body mass index (BMI) of 60.0 to 69.9 in adult 08/13/2019   Nasal congestion 03/10/2019   Sinus pressure 03/10/2019   Hyperglycemia 01/22/2019   Hypertriglyceridemia 01/22/2019   Combined forms of age-related cataract of left eye 09/10/2017   Musculoskeletal neck pain 07/24/2017   Migraine without aura and without status migrainosus, not intractable 11/12/2016   Encounter for preventative adult health care exam with abnormal findings 09/01/2015   Sinusitis, chronic 09/01/2015   Cough 09/01/2015   Morbid obesity with BMI of 50.0-59.9, adult (HCC) 11/18/2014   RUQ pain 10/27/2014   Urinary incontinence 09/17/2013   Acute sinus infection 10/16/2012   Paresthesias 09/22/2012   Sleep apnea, obstructive 09/22/2012   Elevated BP 07/29/2012   Fatigue 07/10/2012   Anxiety 06/02/2012   Closed head injury 06/02/2012   Depression 06/02/2012   Facial pain 06/02/2012   Leg cramps 04/15/2011   Edema 12/21/2010   Chronic pain due to trauma 03/15/2007   Extrinsic asthma 03/15/2007   INSOMNIA, CHRONIC 11/01/2006   Allergic rhinitis 11/01/2006   DYSPEPSIA, CHRONIC 11/01/2006   Head injury 11/01/2006    PCP: Catheryn Slocumb MD  REFERRING PROVIDER:   Sheril Coy, MD    REFERRING DIAG: M17.0 (ICD-10-CM) - Degenerative arthritis of knee, bilateral   THERAPY DIAG:  Bilateral chronic knee pain  Muscle weakness (generalized)  Abnormal posture  Rationale for Evaluation and Treatment: Rehabilitation  ONSET DATE: 1 year  SUBJECTIVE:   SUBJECTIVE STATEMENT:  I feel pretty good today. I have noticed that if I get up and walk every hour at work  walk I much better.   Stiffness really this am not pain  POOL ACCESS: currently none.   Has done aquatic therapy in the past.   eval: Want to avoid having TKR.  Bone spurs are on the back of my knee caps.  Twisted my knee about 1 years ago.  Have been using rollator on and off for past 6 months. Can't straighten my knee. Gel shots have helped.  Just had injections last week.  Have a few more scheduled.  Had a surgery in last few months and I have been somewhat immobile not moving much at all.. Sleep for about 4 hours before waking  PERTINENT HISTORY: TBI 2008/2014 PAIN:  Are you having pain? Yes: NPRS scale: current 2/10 stiffness R>L Pain location: bilat knee joint Pain description: ache Aggravating factors: weight bearing Relieving factors: lidocaine  patches, OTC meds, mag cream  PRECAUTIONS: None  RED FLAGS: None   WEIGHT BEARING RESTRICTIONS: No  FALLS:  Has patient fallen in last 6 months? No tripped walking into the house  LIVING ENVIRONMENT: Lives with: lives with their family Lives in: House/apartment Stairs: 6 step into home Has following equipment at home: Single point cane and rollator and electric scooterlift chair, shower bench  OCCUPATION: disables  PLOF: Independent uses shower benches  PATIENT GOALS: build stamina, increase strength  NEXT MD VISIT: Friday  OBJECTIVE:  Note: Objective measures were completed at Evaluation unless otherwise noted.  DIAGNOSTIC FINDINGS: none in chart  PATIENT SURVEYS:  LEFS 12/80  COGNITION: Overall cognitive status: Within functional limits for tasks assessed     SENSATION: WFL   MUSCLE LENGTH: Hamstrings:shortened bilaterally   POSTURE: rounded shoulders, forward head, and knee flexed trunk forward flexed.  PALPATION: Moderate TTP about knees bilat joint lines medial> Lateral  LOWER EXTREMITY ROM:  Active ROM Right eval Left eval  Hip flexion    Hip extension    Hip abduction    Hip adduction     Hip internal rotation    Hip external rotation    Knee flexion 100d 103  Knee extension -40 -34  Ankle dorsiflexion    Ankle plantarflexion    Ankle inversion    Ankle eversion     (Blank rows = not tested)  LOWER EXTREMITY MMT:  HD (lb) Tested in sitting Right eval Left eval  Hip flexion 44.4 45.9  Hip extension    Hip abduction 27.1 25.9  Hip adduction    Hip internal rotation    Hip external rotation    Knee flexion    Knee extension 33.1 25.6  Ankle dorsiflexion    Ankle plantarflexion    Ankle inversion    Ankle eversion     (Blank rows = not tested)   FUNCTIONAL TESTS:  Timed up and go (TUG): 23.91 STS transfers: heavy ue support from pool bench; forward momentum with ue elevated   GAIT: Distance walked: 100 ft Assistive device utilized: rollator Level of assistance: SBA Comments: bilateral flex contractures: no heel strike, sliding on toes when advancing LE during swing, forward flex trunk posture                                                                                                                                TREATMENT  [facility] Adult PT Treatment:                                                DATE: 10/08/23 Pt seen for aquatic therapy today.  Treatment took place in water 3.5-4.75 ft in depth at the [facility] pool. Temp of water was 91.  Pt entered and exited the pool via stairs using step-to pattern with heavy UE on  hand rail.  * UE on barbell: walking forward/backward and side stepping *standing UE support wall 3.95ft (attempted some completion off of wall but not tolerated): heel/toe raises x 10; hip add/abd x 10; relaxed squats x 10; leg swings into hip flex/ext x 10 *Gait training with cues for heel strike *side stepping yellow HB UE add/abd->slight lunge as tolerated *R and L hamstring then quad stretch at steps  *cycling ue support on yellow noodle * UE on corner in deeper water (no floatation support):hip add/abd; hip  flex/ext  Pt requires the buoyancy and hydrostatic pressure of water for support, and to offload joints by unweighting joint load by at least 50 % in navel deep water and by at least 75-80% in chest to neck deep water.  Viscosity of the water is needed for resistance of strengthening. Water current perturbations provides challenge to standing balance requiring increased core activation.     [facility] Adult PT Treatment:                                                DATE: 10/02/23 Pt seen for aquatic therapy today.  Treatment took place in water 3.5-4.75 ft in depth at the [facility] pool. Temp of water was 91.  Pt entered and exited the pool via stairs using step-to pattern with heavy UE on hand rail.  * UE on barbell: walking forward/backward  * UE on barbell: side stepping -> with arm addct/ abdct with yellow hand floats * walking forward with reciprocal arm swing with light resistance bells x 3 laps (cues for heel strike and flexed knee during swing through) and backwards  *cycling with support yellow noodle (under arms) - not tolerated in right knee - stopped *standing UE support wall 4.65ft: heel/toe raises x 10; hip add/abd x 10; relaxed squats x 5; leg swings into hip flex/ext x 5  * UE on corner in deeper water (no floatation support):  cycling (no pain) * R/L hamstring stretch with heel on 2nd step x 15s x 2 each LE  09/30/23 SAQ unilat x 15 Quad set off bolster x 15 Iso SAQ with ankle pump x 15 Hamstring digs x 20  Ball squeeze x 20 Ball squeeze with bridge x 20 SLR x 15 STW/deep tissue work as tolerated to [employer] Tib and [employer] bil Manual Gastroc/Ant Tib stretch bil  6/20  Manual:  Trigger point release to bilateral hamstringing  Extension stretching with distraction  Patella mobilization  Grade II and III PA and AP mobilization   There-ex:  Quad set 2x8 with cuing for technique and home set up  Hamstring stretch 2x30 sec bilateral  Gastroc stretch seated  2x30 sec hold     09/17/23 [facility] Adult PT Treatment:                                                DATE: 09/17/23 Pt seen for aquatic therapy today.  Treatment took place in water 3.5-4.75 ft in depth at the [facility] pool. Temp of water was 91.  Pt entered the pool via stairs using step to pattern with hand rail, exited via lift for pain management purposes  *Intro to setting *walking forward, back and side  stepping with ue support of noodle 4.7 ft *cycling with support yellow noodle *marching forward ue support noodle *standing ue support wall 4.40ft: df, pf; hip add/abd x 10; relaxed squats x 5 (right knee popped with discomfort ended exercise) *seated on lift: hip add/abd; knee flex/ext.  Pt requires the buoyancy and hydrostatic pressure of water for support, and to offload joints by unweighting joint load by at least 50 % in navel deep water and by at least 75-80% in chest to neck deep water.  Viscosity of the water is needed for resistance of strengthening. Water current perturbations provides challenge to standing balance requiring increased core activation.    09/16/23 Contract relax for bil HS 5 holds- ball placed under opp knee for support - STM to tibialis anterior in supine (ball under knee)  -passive stretching of tib anterior, HS -LAQ 2x10ea     PATIENT EDUCATION:  Education details: Discussed eval findings, rehab rationale, aquatic program progression/POC and pools in area. Patient is in agreement  Person educated: Patient Education method: Explanation Education comprehension: verbalized understanding  HOME EXERCISE PROGRAM: TBA  ASSESSMENT:  CLINICAL IMPRESSION: Pt reports just stiffness today not pain R knee > Left at onset of therapy. She has been walking to and from setting 500 ft each way using rollator with good toleration.  She states she has had some minor increases in knee joint pain post aquatic sessions which she ices to reduce.  She continues  with OTC meds as well.  Pt edu on importance of gaining full knee extension for proper gait as well as if total knee replacements needed in future for optimal outcome.  She VU.  Some right knee discomfort increased during session today.  She received hand out on pools in area as she is planning on gaining access going forward.  She reports some compliance with land based exercises given thus far.     Eval: Patient is a 58 y.o. f who was seen today for physical therapy evaluation and treatment for bilat knee OA. She presents today with husband assisting she is using a rollator.  May require wc assistance for therapy sessions.  Exam reveals significant bilateral knee flex contractures, limited knee flex, strength deficits, balance and proprioception deficits. Her gait is greatly impeded as she is unable to strike her heels and doesn't clear her feet from the floor/shuffles. She is a high fall risk due to posture and gait deficits.  She will benefit from skilled physical therapy both aquatic and land.  Will plan on focusing initially in aquatics on pain management, movement toleration improving ROM and strengthening then progress fairly quickly to alternating with land. Goals to improve all deficits which are significantly affecting her functional mobility and ADL's  OBJECTIVE IMPAIRMENTS: Abnormal gait, decreased activity tolerance, decreased balance, decreased coordination, decreased endurance, decreased knowledge of condition, decreased knowledge of use of DME, decreased mobility, difficulty walking, decreased ROM, decreased strength, impaired flexibility, postural dysfunction, obesity, and pain.   ACTIVITY LIMITATIONS: carrying, lifting, bending, sitting, standing, squatting, sleeping, stairs, transfers, hygiene/grooming, and locomotion level  PARTICIPATION LIMITATIONS: meal prep, cleaning, laundry, driving, shopping, community activity, occupation, and yard work  PERSONAL FACTORS: Behavior pattern,  Fitness, Past/current experiences, and Time since onset of injury/illness/exacerbation are also affecting patient's functional outcome.   REHAB POTENTIAL: Good  CLINICAL DECISION MAKING: Evolving/moderate complexity  EVALUATION COMPLEXITY: Moderate   GOALS: Goals reviewed with patient? Yes  SHORT TERM GOALS: Target date: 10/11/23 Pt will tolerate full aquatic sessions consistently without increase in  pain and with improving function to demonstrate good toleration and effectiveness of intervention.  Baseline: Goal status: INITIAL  2.  Pt will tolerate walking to and from setting and engaging in aquatic therapy session without excessive fatigue or increase in pain to demonstrate improved toleration to activity. Baseline: unable Goal status: INITIAL  3.  Pt will be indep with stair negotiation into and out of pool consistently using hand rail Baseline:  Goal status: INITIAL    4. Pt to verbalize understanding of the importance of exercise/movement on overall well being.    Baseline: immobile for up towards 1 year    Goal status: INITIAL   LONG TERM GOALS: Target date: 11/08/23  Pt to improve on LEFS by at least 9 point to demonstrate statistically significant Improvement in function. Baseline: 12/80 Goal status: INITIAL  2.  Pt will report decrease in pain by at least 50% for improved toleration to activity/quality of life and to demonstrate improved management of pain. Baseline:  Goal status: INITIAL  3.  Pt will improve strength in knee extension by at least 10 lbs to demonstrate improved overall physical function Baseline:  Goal status: INITIAL  4.  Pt to improve bilateral knee extension by 50% for improved gait. Baseline:  Goal status: INITIAL    5. Pt will be indep with final HEP's (land and aquatic as appropriate) for continued management of condition    Baseline:    Goal status: INITIAL   PLAN:  PT FREQUENCY: 2x/week  PT DURATION: 8 weeks  PLANNED  INTERVENTIONS: 97164- PT Re-evaluation, 97110-Therapeutic exercises, 97530- Therapeutic activity, [id number]- Neuromuscular re-education, 97535- Self Care, [id number]- Manual therapy, [id number]- Gait training, [phone]- Orthotic Initial, [id number]- Aquatic Therapy, [phone]- Ionotophoresis 4mg /ml Dexamethasone , Patient/Family education, Balance training, Stair training, Taping, Dry Needling, Joint mobilization, DME instructions, Cryotherapy, and Moist heat  PLAN FOR NEXT SESSION: alternate land/aquatics for Knee ROM/strengthening; gait; balance an proprioception; Review HEP  Ronal Foots) Avraham Benish MPT 10/08/23 11:45 AM [facility] [address] [place], [place], [address] Phone: [phone]   Fax:  [phone]    Date of referral: 08/09/23 Referring provider: Maude Graves MD Referring diagnosis? Bilat knee OA Treatment diagnosis? (if different than referring diagnosis) no  What was this (referring dx) caused by? Arthritis  Lysle of Condition: Chronic (continuous duration > 3 months)   Laterality: Both  Current Functional Measure Score: LEFS 12/80  Objective measurements identify impairments when they are compared to normal values, the uninvolved extremity, and prior level of function.  [x]  Yes  []  No  Objective assessment of functional ability: Severe functional limitations   Briefly describe symptoms: Bilat knee pain, flexion contractures, gait abnormality, strength deficits  How did symptoms start: OA  Average pain intensity:  Last 24 hours: 3/10  Past week: 3/10  How often does the pt experience symptoms? Constantly  How much have the symptoms interfered with usual daily activities? Quite a bit  How has condition changed since care began at this facility? NA - initial visit  In general, how is the patients overall health? Good   BACK PAIN (STarT Back Screening Tool) No

## 2023-10-10 ENCOUNTER — Ambulatory Visit (HOSPITAL_BASED_OUTPATIENT_CLINIC_OR_DEPARTMENT_OTHER): Payer: Self-pay

## 2023-10-10 ENCOUNTER — Encounter (HOSPITAL_BASED_OUTPATIENT_CLINIC_OR_DEPARTMENT_OTHER): Payer: Self-pay

## 2023-10-10 DIAGNOSIS — M6281 Muscle weakness (generalized): Secondary | ICD-10-CM

## 2023-10-10 DIAGNOSIS — R2689 Other abnormalities of gait and mobility: Secondary | ICD-10-CM | POA: Diagnosis not present

## 2023-10-10 DIAGNOSIS — G8929 Other chronic pain: Secondary | ICD-10-CM | POA: Diagnosis not present

## 2023-10-10 DIAGNOSIS — M25562 Pain in left knee: Secondary | ICD-10-CM | POA: Diagnosis not present

## 2023-10-10 DIAGNOSIS — R293 Abnormal posture: Secondary | ICD-10-CM

## 2023-10-10 DIAGNOSIS — M25561 Pain in right knee: Secondary | ICD-10-CM | POA: Diagnosis not present

## 2023-10-10 NOTE — Therapy (Signed)
 OUTPATIENT PHYSICAL THERAPY LOWER EXTREMITY TREATMENT   Patient Name: [name] Wright MRN: [id number] DOB:[birth year], 58 y.o., female Today's Date: 10/10/2023  END OF SESSION:  PT End of Session - 10/10/23 1337     Visit Number 8    Date for PT Re-Evaluation 11/08/23    Authorization Type [employer] mcr approved 16 visits 6/3 - 7-29    PT Start Time 1333    PT Stop Time 1417    PT Time Calculation (min) 44 min    Activity Tolerance Patient tolerated treatment well    Behavior During Therapy [facility] for tasks assessed/performed              Past Medical History:  Diagnosis Date   Acute cystitis    Allergic rhinitis    Childhood asthma    no problems as adult - no inhaler   Chronic insomnia    Chronic pain due to trauma 02/2001   closed head trauma - Followed by Dr Elspeth Mems [facility]   Complication of anesthesia    Dyspepsia    Family history of ovarian cancer    Head trauma 02/24/2001   closed   History of kidney stones    passed stone - no surgery required   Hypertension    Hypothyroidism    PONV (postoperative nausea and vomiting)    Pre-diabetes    Sleep apnea    Pt denies   SVD (spontaneous vaginal delivery)    fetal demise at 5 months   Past Surgical History:  Procedure Laterality Date   APPENDECTOMY     BIOPSY  08/25/2020   Procedure: BIOPSY;  Surgeon: San Sandor GAILS, DO;  Location: [facility];  Service: Gastroenterology;;   COLONOSCOPY WITH PROPOFOL  N/A 08/25/2020   Procedure: COLONOSCOPY WITH PROPOFOL ;  Surgeon: San Sandor GAILS, DO;  Location: [facility];  Service: Gastroenterology;  Laterality: N/A;   DILATATION & CURETTAGE/HYSTEROSCOPY WITH MYOSURE N/A 04/15/2017   Procedure: DILATATION & CURETTAGE/HYSTEROSCOPY WITH MYOSURE POLYPECTOMY;  Surgeon: Curlene Agent, MD;  Location: [facility];  Service: Gynecology;  Laterality: N/A;   DILATATION & CURETTAGE/HYSTEROSCOPY WITH MYOSURE N/A 06/18/2019   Procedure: DILATATION &  CURETTAGE/HYSTEROSCOPY WITH  MYOSURE;  Surgeon: Curlene Agent, MD;  Location: [facility];  Service: Gynecology;  Laterality: N/A;   DILATATION & CURETTAGE/HYSTEROSCOPY WITH MYOSURE N/A 06/07/2023   Procedure: DILATATION & CURETTAGE/HYSTEROSCOPY WITH  MYOSURE;  Surgeon: Curlene Agent, MD;  Location: [facility];  Service: Gynecology;  Laterality: N/A;  POSSIBLE MYOSURE [facility]   ESOPHAGOGASTRODUODENOSCOPY (EGD) WITH PROPOFOL  N/A 08/25/2020   Procedure: ESOPHAGOGASTRODUODENOSCOPY (EGD) WITH PROPOFOL ;  Surgeon: San Sandor GAILS, DO;  Location: [facility];  Service: Gastroenterology;  Laterality: N/A;   EYE SURGERY     cataracts removed   INCONTINENCE SURGERY     PILONIDAL CYST EXCISION     x2   POLYPECTOMY  08/25/2020   Procedure: POLYPECTOMY;  Surgeon: San Sandor GAILS, DO;  Location: [facility];  Service: Gastroenterology;;   repair of toe laceration     dermabond to right big toe right foot   repair of torn ligament right leg     patient denies this surgery   right foot fracture     cast only   TONSILLECTOMY     WISDOM TOOTH EXTRACTION     Patient Active Problem List   Diagnosis Date Noted   Urgency of urination 09/26/2023   Vertigo 08/19/2023   Swimmer's ear 08/19/2023   Rash and nonspecific skin eruption 08/19/2023   Chronic  pain of both knees 08/17/2023   Thrush, oral 07/07/2023   Female climacteric state 05/14/2023   Pre-op exam 05/14/2023   Class 3 severe obesity due to excess calories with body mass index (BMI) of 50.0 to 59.9 in adult 03/25/2023   Primary hypothyroidism 03/25/2023   Otalgia, right ear 03/25/2023   Vitamin D  deficiency 02/06/2023   Vitamin B12 deficiency 02/06/2023   Urinary frequency 05/27/2022   Leukocytes in urine 05/27/2022   Costochondritis 05/14/2022   Genetic testing 03/08/2021   Family history of ovarian cancer 02/16/2021   Uncontrolled type 2 diabetes mellitus with hyperglycemia (HCC) 02/07/2021   Essential hypertension, benign 02/07/2021    Gastritis and gastroduodenitis    Nausea without vomiting    Adenomatous polyp of ascending colon    Adenomatous polyp of transverse colon    Adenomatous polyp of sigmoid colon    Rectal polyp    Adenomatous polyp of descending colon    Gall bladder pain 11/04/2019   Class 3 severe obesity due to excess calories with serious comorbidity and body mass index (BMI) of 60.0 to 69.9 in adult 08/13/2019   Nasal congestion 03/10/2019   Sinus pressure 03/10/2019   Hyperglycemia 01/22/2019   Hypertriglyceridemia 01/22/2019   Combined forms of age-related cataract of left eye 09/10/2017   Musculoskeletal neck pain 07/24/2017   Migraine without aura and without status migrainosus, not intractable 11/12/2016   Encounter for preventative adult health care exam with abnormal findings 09/01/2015   Sinusitis, chronic 09/01/2015   Cough 09/01/2015   Morbid obesity with BMI of 50.0-59.9, adult (HCC) 11/18/2014   RUQ pain 10/27/2014   Urinary incontinence 09/17/2013   Acute sinus infection 10/16/2012   Paresthesias 09/22/2012   Sleep apnea, obstructive 09/22/2012   Elevated BP 07/29/2012   Fatigue 07/10/2012   Anxiety 06/02/2012   Closed head injury 06/02/2012   Depression 06/02/2012   Facial pain 06/02/2012   Leg cramps 04/15/2011   Edema 12/21/2010   Chronic pain due to trauma 03/15/2007   Extrinsic asthma 03/15/2007   INSOMNIA, CHRONIC 11/01/2006   Allergic rhinitis 11/01/2006   DYSPEPSIA, CHRONIC 11/01/2006   Head injury 11/01/2006    PCP: Catheryn Slocumb MD  REFERRING PROVIDER:   Sheril Coy, MD    REFERRING DIAG: M17.0 (ICD-10-CM) - Degenerative arthritis of knee, bilateral   THERAPY DIAG:  Bilateral chronic knee pain  Muscle weakness (generalized)  Abnormal posture  Other abnormalities of gait and mobility  Rationale for Evaluation and Treatment: Rehabilitation  ONSET DATE: 1 year  SUBJECTIVE:   SUBJECTIVE STATEMENT: I think I found a pool I can go to. She  reports R knee pain at entry 4/10. L knee is 2/10. Had a lot of fluid in legs yesterday and took a fluid pill today. Had dental work done yesterday and has been on pain medication. Has ben trying to walk more overall. Can walk short distances without AD. Has not done exercise this week.   POOL ACCESS: currently none.   Has done aquatic therapy in the past.   eval: Want to avoid having TKR.  Bone spurs are on the back of my knee caps.  Twisted my knee about 1 years ago.  Have been using rollator on and off for past 6 months. Can't straighten my knee. Gel shots have helped.  Just had injections last week.  Have a few more scheduled.  Had a surgery in last few months and I have been somewhat immobile not moving much at all.. Sleep for about  4 hours before waking  PERTINENT HISTORY: TBI 2008/2014 PAIN:  Are you having pain? Yes: NPRS scale: current 2/10 stiffness R>L Pain location: bilat knee joint Pain description: ache Aggravating factors: weight bearing Relieving factors: lidocaine  patches, OTC meds, mag cream  PRECAUTIONS: None  RED FLAGS: None   WEIGHT BEARING RESTRICTIONS: No  FALLS:  Has patient fallen in last 6 months? No tripped walking into the house  LIVING ENVIRONMENT: Lives with: lives with their family Lives in: House/apartment Stairs: 6 step into home Has following equipment at home: Single point cane and rollator and electric scooterlift chair, shower bench  OCCUPATION: disables  PLOF: Independent uses shower benches  PATIENT GOALS: build stamina, increase strength  NEXT MD VISIT: Friday  OBJECTIVE:  Note: Objective measures were completed at Evaluation unless otherwise noted.  DIAGNOSTIC FINDINGS: none in chart  PATIENT SURVEYS:  LEFS 12/80  COGNITION: Overall cognitive status: Within functional limits for tasks assessed     SENSATION: WFL   MUSCLE LENGTH: Hamstrings:shortened bilaterally   POSTURE: rounded shoulders, forward head, and knee  flexed trunk forward flexed.  PALPATION: Moderate TTP about knees bilat joint lines medial> Lateral  LOWER EXTREMITY ROM:  Active ROM Right eval Left eval  Hip flexion    Hip extension    Hip abduction    Hip adduction    Hip internal rotation    Hip external rotation    Knee flexion 100d 103  Knee extension -40 -34  Ankle dorsiflexion    Ankle plantarflexion    Ankle inversion    Ankle eversion     (Blank rows = not tested)  LOWER EXTREMITY MMT:  HD (lb) Tested in sitting Right eval Left eval  Hip flexion 44.4 45.9  Hip extension    Hip abduction 27.1 25.9  Hip adduction    Hip internal rotation    Hip external rotation    Knee flexion    Knee extension 33.1 25.6  Ankle dorsiflexion    Ankle plantarflexion    Ankle inversion    Ankle eversion     (Blank rows = not tested)   FUNCTIONAL TESTS:  Timed up and go (TUG): 23.91 STS transfers: heavy ue support from pool bench; forward momentum with ue elevated   GAIT: Distance walked: 100 ft Assistive device utilized: rollator Level of assistance: SBA Comments: bilateral flex contractures: no heel strike, sliding on toes when advancing LE during swing, forward flex trunk posture                                                                                                                                TREATMENT   10/10/23 STM to posterior knee/distal HS Heel prop with 1/2 roll - 15 seconds on R LE, on L LE SAQ 2x10ea Supine SLR 2x10ea Gait in hall with rollator 42ft, seated rest break, 57ft Standing calf stretch (back of nu-step) 20sec x2ea     [facility] Adult  PT Treatment:                                                DATE: 10/08/23 Pt seen for aquatic therapy today.  Treatment took place in water 3.5-4.75 ft in depth at the [facility] pool. Temp of water was 91.  Pt entered and exited the pool via stairs using step-to pattern with heavy UE on hand rail.  * UE on barbell: walking  forward/backward and side stepping *standing UE support wall 3.67ft (attempted some completion off of wall but not tolerated): heel/toe raises x 10; hip add/abd x 10; relaxed squats x 10; leg swings into hip flex/ext x 10 *Gait training with cues for heel strike *side stepping yellow HB UE add/abd->slight lunge as tolerated *R and L hamstring then quad stretch at steps  *cycling ue support on yellow noodle * UE on corner in deeper water (no floatation support):hip add/abd; hip flex/ext  Pt requires the buoyancy and hydrostatic pressure of water for support, and to offload joints by unweighting joint load by at least 50 % in navel deep water and by at least 75-80% in chest to neck deep water.  Viscosity of the water is needed for resistance of strengthening. Water current perturbations provides challenge to standing balance requiring increased core activation.     [facility] Adult PT Treatment:                                                DATE: 10/02/23 Pt seen for aquatic therapy today.  Treatment took place in water 3.5-4.75 ft in depth at the [facility] pool. Temp of water was 91.  Pt entered and exited the pool via stairs using step-to pattern with heavy UE on hand rail.  * UE on barbell: walking forward/backward  * UE on barbell: side stepping -> with arm addct/ abdct with yellow hand floats * walking forward with reciprocal arm swing with light resistance bells x 3 laps (cues for heel strike and flexed knee during swing through) and backwards  *cycling with support yellow noodle (under arms) - not tolerated in right knee - stopped *standing UE support wall 4.59ft: heel/toe raises x 10; hip add/abd x 10; relaxed squats x 5; leg swings into hip flex/ext x 5  * UE on corner in deeper water (no floatation support):  cycling (no pain) * R/L hamstring stretch with heel on 2nd step x 15s x 2 each LE  09/30/23 SAQ unilat x 15 Quad set off bolster x 15 Iso SAQ with ankle pump x  15 Hamstring digs x 20  Ball squeeze x 20 Ball squeeze with bridge x 20 SLR x 15 STW/deep tissue work as tolerated to [employer] Tib and [employer] bil Manual Gastroc/Ant Tib stretch bil  6/20  Manual:  Trigger point release to bilateral hamstringing  Extension stretching with distraction  Patella mobilization  Grade II and III PA and AP mobilization   There-ex:  Quad set 2x8 with cuing for technique and home set up  Hamstring stretch 2x30 sec bilateral  Gastroc stretch seated 2x30 sec hold     09/17/23 [facility] Adult PT Treatment:  DATE: 09/17/23 Pt seen for aquatic therapy today.  Treatment took place in water 3.5-4.75 ft in depth at the [facility] pool. Temp of water was 91.  Pt entered the pool via stairs using step to pattern with hand rail, exited via lift for pain management purposes  *Intro to setting *walking forward, back and side stepping with ue support of noodle 4.7 ft *cycling with support yellow noodle *marching forward ue support noodle *standing ue support wall 4.20ft: df, pf; hip add/abd x 10; relaxed squats x 5 (right knee popped with discomfort ended exercise) *seated on lift: hip add/abd; knee flex/ext.  Pt requires the buoyancy and hydrostatic pressure of water for support, and to offload joints by unweighting joint load by at least 50 % in navel deep water and by at least 75-80% in chest to neck deep water.  Viscosity of the water is needed for resistance of strengthening. Water current perturbations provides challenge to standing balance requiring increased core activation.    09/16/23 Contract relax for bil HS 5 holds- ball placed under opp knee for support - STM to tibialis anterior in supine (ball under knee)  -passive stretching of tib anterior, HS -LAQ 2x10ea     PATIENT EDUCATION:  Education details: Discussed eval findings, rehab rationale, aquatic program progression/POC and pools in area.  Patient is in agreement  Person educated: Patient Education method: Explanation Education comprehension: verbalized understanding  HOME EXERCISE PROGRAM: TBA  ASSESSMENT:  CLINICAL IMPRESSION: Pt remains significantly tender into distal HS. Spent time on MT to address this as she reported benefit from this at previous sessions. Pt felt improved knee extension following this. Trialed heel prop stretching with fair tolerance. Difficulty with R LE tolerating this. Instructed pt in home performance of this. With walking, she demonstrates poor endurance, requiring seated rest break after 51ft. Verbal cuing provided for pt to keep rollator close to body. Increase in L knee pain to 5/10 at end of session. Instructed in use of ice for symptom management.      Eval: Patient is a 58 y.o. f who was seen today for physical therapy evaluation and treatment for bilat knee OA. She presents today with husband assisting she is using a rollator.  May require wc assistance for therapy sessions.  Exam reveals significant bilateral knee flex contractures, limited knee flex, strength deficits, balance and proprioception deficits. Her gait is greatly impeded as she is unable to strike her heels and doesn't clear her feet from the floor/shuffles. She is a high fall risk due to posture and gait deficits.  She will benefit from skilled physical therapy both aquatic and land.  Will plan on focusing initially in aquatics on pain management, movement toleration improving ROM and strengthening then progress fairly quickly to alternating with land. Goals to improve all deficits which are significantly affecting her functional mobility and ADL's  OBJECTIVE IMPAIRMENTS: Abnormal gait, decreased activity tolerance, decreased balance, decreased coordination, decreased endurance, decreased knowledge of condition, decreased knowledge of use of DME, decreased mobility, difficulty walking, decreased ROM, decreased strength, impaired  flexibility, postural dysfunction, obesity, and pain.   ACTIVITY LIMITATIONS: carrying, lifting, bending, sitting, standing, squatting, sleeping, stairs, transfers, hygiene/grooming, and locomotion level  PARTICIPATION LIMITATIONS: meal prep, cleaning, laundry, driving, shopping, community activity, occupation, and yard work  PERSONAL FACTORS: Behavior pattern, Fitness, Past/current experiences, and Time since onset of injury/illness/exacerbation are also affecting patient's functional outcome.   REHAB POTENTIAL: Good  CLINICAL DECISION MAKING: Evolving/moderate complexity  EVALUATION COMPLEXITY: Moderate   GOALS:  Goals reviewed with patient? Yes  SHORT TERM GOALS: Target date: 10/11/23 Pt will tolerate full aquatic sessions consistently without increase in pain and with improving function to demonstrate good toleration and effectiveness of intervention.  Baseline: Goal status: INITIAL  2.  Pt will tolerate walking to and from setting and engaging in aquatic therapy session without excessive fatigue or increase in pain to demonstrate improved toleration to activity. Baseline: unable Goal status: INITIAL  3.  Pt will be indep with stair negotiation into and out of pool consistently using hand rail Baseline:  Goal status: INITIAL    4. Pt to verbalize understanding of the importance of exercise/movement on overall well being.    Baseline: immobile for up towards 1 year    Goal status: INITIAL   LONG TERM GOALS: Target date: 11/08/23  Pt to improve on LEFS by at least 9 point to demonstrate statistically significant Improvement in function. Baseline: 12/80 Goal status: INITIAL  2.  Pt will report decrease in pain by at least 50% for improved toleration to activity/quality of life and to demonstrate improved management of pain. Baseline:  Goal status: INITIAL  3.  Pt will improve strength in knee extension by at least 10 lbs to demonstrate improved overall physical  function Baseline:  Goal status: INITIAL  4.  Pt to improve bilateral knee extension by 50% for improved gait. Baseline:  Goal status: INITIAL    5. Pt will be indep with final HEP's (land and aquatic as appropriate) for continued management of condition    Baseline:    Goal status: INITIAL   PLAN:  PT FREQUENCY: 2x/week  PT DURATION: 8 weeks  PLANNED INTERVENTIONS: [id number]- PT Re-evaluation, 97110-Therapeutic exercises, 97530- Therapeutic activity, [id number]- Neuromuscular re-education, 97535- Self Care, [id number]- Manual therapy, [id number]- Gait training, [phone]- Orthotic Initial, [phone]- Aquatic Therapy, [phone]- Ionotophoresis 4mg /ml Dexamethasone , Patient/Family education, Balance training, Stair training, Taping, Dry Needling, Joint mobilization, DME instructions, Cryotherapy, and Moist heat  PLAN FOR NEXT SESSION: alternate land/aquatics for Knee ROM/strengthening; gait; balance an proprioception; Review HEP  Asberry Rodes, PTA  10/10/23 4:01 PM [facility] [address] [place], [place], [address] Phone: [phone]   Fax:  [phone]    Date of referral: 08/09/23 Referring provider: Maude Graves MD Referring diagnosis? Bilat knee OA Treatment diagnosis? (if different than referring diagnosis) no  What was this (referring dx) caused by? Arthritis  Lysle of Condition: Chronic (continuous duration > 3 months)   Laterality: Both  Current Functional Measure Score: LEFS 12/80  Objective measurements identify impairments when they are compared to normal values, the uninvolved extremity, and prior level of function.  [x]  Yes  []  No  Objective assessment of functional ability: Severe functional limitations   Briefly describe symptoms: Bilat knee pain, flexion contractures, gait abnormality, strength deficits  How did symptoms start: OA  Average pain intensity:  Last 24 hours: 3/10  Past week: 3/10  How often does the pt  experience symptoms? Constantly  How much have the symptoms interfered with usual daily activities? Quite a bit  How has condition changed since care began at this facility? NA - initial visit  In general, how is the patients overall health? Good   BACK PAIN (STarT Back Screening Tool) No

## 2023-10-14 ENCOUNTER — Encounter (HOSPITAL_BASED_OUTPATIENT_CLINIC_OR_DEPARTMENT_OTHER): Payer: Self-pay | Admitting: Physical Therapy

## 2023-10-14 ENCOUNTER — Ambulatory Visit (HOSPITAL_BASED_OUTPATIENT_CLINIC_OR_DEPARTMENT_OTHER): Admitting: Physical Therapy

## 2023-10-14 DIAGNOSIS — M6281 Muscle weakness (generalized): Secondary | ICD-10-CM

## 2023-10-14 DIAGNOSIS — G8929 Other chronic pain: Secondary | ICD-10-CM

## 2023-10-14 DIAGNOSIS — R293 Abnormal posture: Secondary | ICD-10-CM

## 2023-10-14 DIAGNOSIS — R2689 Other abnormalities of gait and mobility: Secondary | ICD-10-CM

## 2023-10-14 DIAGNOSIS — M25561 Pain in right knee: Secondary | ICD-10-CM | POA: Diagnosis not present

## 2023-10-14 DIAGNOSIS — M25562 Pain in left knee: Secondary | ICD-10-CM | POA: Diagnosis not present

## 2023-10-14 NOTE — Therapy (Signed)
 OUTPATIENT PHYSICAL THERAPY LOWER EXTREMITY TREATMENT Progress Note Reporting Period 09/10/23 to 10/14/23  See note below for Objective Data and Assessment of Progress/Goals.      Patient Name: [name] Wright MRN: [id number] DOB:23-Feb-1966, 58 y.o., female Today's Date: 10/14/2023  END OF SESSION:  PT End of Session - 10/14/23 0937     Visit Number 9    Date for PT Re-Evaluation 11/08/23    Authorization Type [employer] mcr approved 16 visits 6/3 - 7-29    PT Start Time 0931    PT Stop Time 1010    PT Time Calculation (min) 39 min    Activity Tolerance Patient tolerated treatment well    Behavior During Therapy [facility] for tasks assessed/performed              Past Medical History:  Diagnosis Date   Acute cystitis    Allergic rhinitis    Childhood asthma    no problems as adult - no inhaler   Chronic insomnia    Chronic pain due to trauma 02/2001   closed head trauma - Followed by Dr Elspeth Mems [facility]   Complication of anesthesia    Dyspepsia    Family history of ovarian cancer    Head trauma 02/24/2001   closed   History of kidney stones    passed stone - no surgery required   Hypertension    Hypothyroidism    PONV (postoperative nausea and vomiting)    Pre-diabetes    Sleep apnea    Pt denies   SVD (spontaneous vaginal delivery)    fetal demise at 5 months   Past Surgical History:  Procedure Laterality Date   APPENDECTOMY     BIOPSY  08/25/2020   Procedure: BIOPSY;  Surgeon: San Sandor GAILS, DO;  Location: [facility];  Service: Gastroenterology;;   COLONOSCOPY WITH PROPOFOL  N/A 08/25/2020   Procedure: COLONOSCOPY WITH PROPOFOL ;  Surgeon: San Sandor GAILS, DO;  Location: [facility];  Service: Gastroenterology;  Laterality: N/A;   DILATATION & CURETTAGE/HYSTEROSCOPY WITH MYOSURE N/A 04/15/2017   Procedure: DILATATION & CURETTAGE/HYSTEROSCOPY WITH MYOSURE POLYPECTOMY;  Surgeon: Curlene Agent, MD;  Location: [facility];  Service:  Gynecology;  Laterality: N/A;   DILATATION & CURETTAGE/HYSTEROSCOPY WITH MYOSURE N/A 06/18/2019   Procedure: DILATATION & CURETTAGE/HYSTEROSCOPY WITH  MYOSURE;  Surgeon: Curlene Agent, MD;  Location: [facility];  Service: Gynecology;  Laterality: N/A;   DILATATION & CURETTAGE/HYSTEROSCOPY WITH MYOSURE N/A 06/07/2023   Procedure: DILATATION & CURETTAGE/HYSTEROSCOPY WITH  MYOSURE;  Surgeon: Curlene Agent, MD;  Location: [facility] OR;  Service: Gynecology;  Laterality: N/A;  POSSIBLE MYOSURE [facility]   ESOPHAGOGASTRODUODENOSCOPY (EGD) WITH PROPOFOL  N/A 08/25/2020   Procedure: ESOPHAGOGASTRODUODENOSCOPY (EGD) WITH PROPOFOL ;  Surgeon: San Sandor GAILS, DO;  Location: [facility];  Service: Gastroenterology;  Laterality: N/A;   EYE SURGERY     cataracts removed   INCONTINENCE SURGERY     PILONIDAL CYST EXCISION     x2   POLYPECTOMY  08/25/2020   Procedure: POLYPECTOMY;  Surgeon: San Sandor GAILS, DO;  Location: [facility];  Service: Gastroenterology;;   repair of toe laceration     dermabond to right big toe right foot   repair of torn ligament right leg     patient denies this surgery   right foot fracture     cast only   TONSILLECTOMY     WISDOM TOOTH EXTRACTION     Patient Active Problem List   Diagnosis Date Noted   Urgency of urination  09/26/2023   Vertigo 08/19/2023   Swimmer's ear 08/19/2023   Rash and nonspecific skin eruption 08/19/2023   Chronic pain of both knees 08/17/2023   Thrush, oral 07/07/2023   Female climacteric state 05/14/2023   Pre-op exam 05/14/2023   Class 3 severe obesity due to excess calories with body mass index (BMI) of 50.0 to 59.9 in adult 03/25/2023   Primary hypothyroidism 03/25/2023   Otalgia, right ear 03/25/2023   Vitamin D  deficiency 02/06/2023   Vitamin B12 deficiency 02/06/2023   Urinary frequency 05/27/2022   Leukocytes in urine 05/27/2022   Costochondritis 05/14/2022   Genetic testing 03/08/2021   Family history of ovarian cancer 02/16/2021    Uncontrolled type 2 diabetes mellitus with hyperglycemia (HCC) 02/07/2021   Essential hypertension, benign 02/07/2021   Gastritis and gastroduodenitis    Nausea without vomiting    Adenomatous polyp of ascending colon    Adenomatous polyp of transverse colon    Adenomatous polyp of sigmoid colon    Rectal polyp    Adenomatous polyp of descending colon    Gall bladder pain 11/04/2019   Class 3 severe obesity due to excess calories with serious comorbidity and body mass index (BMI) of 60.0 to 69.9 in adult 08/13/2019   Nasal congestion 03/10/2019   Sinus pressure 03/10/2019   Hyperglycemia 01/22/2019   Hypertriglyceridemia 01/22/2019   Combined forms of age-related cataract of left eye 09/10/2017   Musculoskeletal neck pain 07/24/2017   Migraine without aura and without status migrainosus, not intractable 11/12/2016   Encounter for preventative adult health care exam with abnormal findings 09/01/2015   Sinusitis, chronic 09/01/2015   Cough 09/01/2015   Morbid obesity with BMI of 50.0-59.9, adult (HCC) 11/18/2014   RUQ pain 10/27/2014   Urinary incontinence 09/17/2013   Acute sinus infection 10/16/2012   Paresthesias 09/22/2012   Sleep apnea, obstructive 09/22/2012   Elevated BP 07/29/2012   Fatigue 07/10/2012   Anxiety 06/02/2012   Closed head injury 06/02/2012   Depression 06/02/2012   Facial pain 06/02/2012   Leg cramps 04/15/2011   Edema 12/21/2010   Chronic pain due to trauma 03/15/2007   Extrinsic asthma 03/15/2007   INSOMNIA, CHRONIC 11/01/2006   Allergic rhinitis 11/01/2006   DYSPEPSIA, CHRONIC 11/01/2006   Head injury 11/01/2006    PCP: Catheryn Slocumb MD  REFERRING PROVIDER:   Sheril Coy, MD    REFERRING DIAG: M17.0 (ICD-10-CM) - Degenerative arthritis of knee, bilateral   THERAPY DIAG:  Bilateral chronic knee pain  Muscle weakness (generalized)  Abnormal posture  Other abnormalities of gait and mobility  Rationale for Evaluation and Treatment:  Rehabilitation  ONSET DATE: 1 year  SUBJECTIVE:   SUBJECTIVE STATEMENT: I think I found a pool I can go to. She reports R knee pain at entry 4/10. L knee is 5/10. Moved sideways getting out of car and my left knee did not like it.  Left knee is worse than right today.  I have been doing the stretches some [place] gave me.   POOL ACCESS: currently none.   Has done aquatic therapy in the past.   eval: Want to avoid having TKR.  Bone spurs are on the back of my knee caps.  Twisted my knee about 1 years ago.  Have been using rollator on and off for past 6 months. Can't straighten my knee. Gel shots have helped.  Just had injections last week.  Have a few more scheduled.  Had a surgery in last few months and I have been  somewhat immobile not moving much at all.. Sleep for about 4 hours before waking  PERTINENT HISTORY: TBI 2008/2014 PAIN:  Are you having pain? Yes: NPRS scale: current 2/10 stiffness R>L Pain location: bilat knee joint Pain description: ache Aggravating factors: weight bearing Relieving factors: lidocaine  patches, OTC meds, mag cream  PRECAUTIONS: None  RED FLAGS: None   WEIGHT BEARING RESTRICTIONS: No  FALLS:  Has patient fallen in last 6 months? No tripped walking into the house  LIVING ENVIRONMENT: Lives with: lives with their family Lives in: House/apartment Stairs: 6 step into home Has following equipment at home: Single point cane and rollator and electric scooterlift chair, shower bench  OCCUPATION: disables  PLOF: Independent uses shower benches  PATIENT GOALS: build stamina, increase strength  NEXT MD VISIT: Friday  OBJECTIVE:  Note: Objective measures were completed at Evaluation unless otherwise noted.  DIAGNOSTIC FINDINGS: none in chart  PATIENT SURVEYS:  LEFS 12/80  COGNITION: Overall cognitive status: Within functional limits for tasks assessed     SENSATION: WFL   MUSCLE LENGTH: Hamstrings:shortened bilaterally   POSTURE:  rounded shoulders, forward head, and knee flexed trunk forward flexed.  PALPATION: Moderate TTP about knees bilat joint lines medial> Lateral  LOWER EXTREMITY ROM:  Active ROM Right eval Left eval  Hip flexion    Hip extension    Hip abduction    Hip adduction    Hip internal rotation    Hip external rotation    Knee flexion 100d 103  Knee extension -40 -34  Ankle dorsiflexion    Ankle plantarflexion    Ankle inversion    Ankle eversion     (Blank rows = not tested)  LOWER EXTREMITY MMT:  HD (lb) Tested in sitting Right eval Left eval  Hip flexion 44.4 45.9  Hip extension    Hip abduction 27.1 25.9  Hip adduction    Hip internal rotation    Hip external rotation    Knee flexion    Knee extension 33.1 25.6  Ankle dorsiflexion    Ankle plantarflexion    Ankle inversion    Ankle eversion     (Blank rows = not tested)   FUNCTIONAL TESTS:  Timed up and go (TUG): 23.91 STS transfers: heavy ue support from pool bench; forward momentum with ue elevated   GAIT: Distance walked: 100 ft Assistive device utilized: rollator Level of assistance: SBA Comments: bilateral flex contractures: no heel strike, sliding on toes when advancing LE during swing, forward flex trunk posture                                                                                                                                TREATMENT  [facility] Adult PT Treatment:  DATE: 10/14/23 Pt seen for aquatic therapy today.  Treatment took place in water 3.5-4.75 ft in depth at the [facility] pool. Temp of water was 91.  Pt entered and exited the pool via stairs using step-to pattern with heavy UE on hand rail.  * Unsupported: walking forward/backward and side stepping *side stepping yellow HB UE add/abd->slight lunge as tolerated *R and L hamstring then quad stretch at steps  *Gastroc stretch bottom step *standing UE support  yellow HB: df; hip  add/abd ( not tolerated RLE due to increased pain); hip flex/ext *UE support wall 3.47ft : relaxed squats ; PF *Forward marching with knee kicks ue support yellow HB *Gait training with cues for heel strike *cycling ue support on yellow noodle * UE on corner in deeper water (no floatation support):hip add/abd; hip flex/ext  Pt requires the buoyancy and hydrostatic pressure of water for support, and to offload joints by unweighting joint load by at least 50 % in navel deep water and by at least 75-80% in chest to neck deep water.  Viscosity of the water is needed for resistance of strengthening. Water current perturbations provides challenge to standing balance requiring increased core activation.   10/10/23 STM to posterior knee/distal HS Heel prop with 1/2 roll - 15 seconds on R LE, on L LE SAQ 2x10ea Supine SLR 2x10ea Gait in hall with rollator 58ft, seated rest break, 68ft Standing calf stretch (back of nu-step) 20sec x2ea     [facility] Adult PT Treatment:                                                DATE: 10/08/23 Pt seen for aquatic therapy today.  Treatment took place in water 3.5-4.75 ft in depth at the [facility] pool. Temp of water was 91.  Pt entered and exited the pool via stairs using step-to pattern with heavy UE on hand rail.  * UE on barbell: walking forward/backward and side stepping *standing UE support wall 3.46ft (attempted some completion off of wall but not tolerated): heel/toe raises x 10; hip add/abd x 10; relaxed squats x 10; leg swings into hip flex/ext x 10 *Gait training with cues for heel strike *side stepping yellow HB UE add/abd->slight lunge as tolerated *R and L hamstring then quad stretch at steps  *cycling ue support on yellow noodle * UE on corner in deeper water (no floatation support):hip add/abd; hip flex/ext  Pt requires the buoyancy and hydrostatic pressure of water for support, and to offload joints by unweighting joint load by at  least 50 % in navel deep water and by at least 75-80% in chest to neck deep water.  Viscosity of the water is needed for resistance of strengthening. Water current perturbations provides challenge to standing balance requiring increased core activation.     [facility] Adult PT Treatment:                                                DATE: 10/02/23 Pt seen for aquatic therapy today.  Treatment took place in water 3.5-4.75 ft in depth at the [facility] pool. Temp of water was 91.  Pt entered and exited the pool via stairs using step-to pattern with  heavy UE on hand rail.  * UE on barbell: walking forward/backward  * UE on barbell: side stepping -> with arm addct/ abdct with yellow hand floats * walking forward with reciprocal arm swing with light resistance bells x 3 laps (cues for heel strike and flexed knee during swing through) and backwards  *cycling with support yellow noodle (under arms) - not tolerated in right knee - stopped *standing UE support wall 4.41ft: heel/toe raises x 10; hip add/abd x 10; relaxed squats x 5; leg swings into hip flex/ext x 5  * UE on corner in deeper water (no floatation support):  cycling (no pain) * R/L hamstring stretch with heel on 2nd step x 15s x 2 each LE  09/30/23 SAQ unilat x 15 Quad set off bolster x 15 Iso SAQ with ankle pump x 15 Hamstring digs x 20  Ball squeeze x 20 Ball squeeze with bridge x 20 SLR x 15 STW/deep tissue work as tolerated to [employer] Tib and [employer] bil Manual Gastroc/Ant Tib stretch bil  6/20  Manual:  Trigger point release to bilateral hamstringing  Extension stretching with distraction  Patella mobilization  Grade II and III PA and AP mobilization   There-ex:  Quad set 2x8 with cuing for technique and home set up  Hamstring stretch 2x30 sec bilateral  Gastroc stretch seated 2x30 sec hold     09/17/23 [facility] Adult PT Treatment:                                                DATE: 09/17/23 Pt seen for aquatic  therapy today.  Treatment took place in water 3.5-4.75 ft in depth at the [facility] pool. Temp of water was 91.  Pt entered and exits the pool via stairs using step to pattern with hand rail, exited via lift for pain management purposes  *Intro to setting *walking forward, back and side stepping with ue support of noodle 4.7 ft *cycling with support yellow noodle *marching forward ue support noodle *standing ue support wall 4.59ft: df, pf; hip add/abd x 10; relaxed squats x 5 (right knee popped with discomfort ended exercise) *seated on lift: hip add/abd; knee flex/ext.  Pt requires the buoyancy and hydrostatic pressure of water for support, and to offload joints by unweighting joint load by at least 50 % in navel deep water and by at least 75-80% in chest to neck deep water.  Viscosity of the water is needed for resistance of strengthening. Water current perturbations provides challenge to standing balance requiring increased core activation.    09/16/23 Contract relax for bil HS 5 holds- ball placed under opp knee for support - STM to tibialis anterior in supine (ball under knee)  -passive stretching of tib anterior, HS -LAQ 2x10ea     PATIENT EDUCATION:  Education details: Discussed eval findings, rehab rationale, aquatic program progression/POC and pools in area. Patient is in agreement  Person educated: Patient Education method: Explanation Education comprehension: verbalized understanding  HOME EXERCISE PROGRAM: TBA  ASSESSMENT:  CLINICAL IMPRESSION: PN: Visually pt demonstrating improving knee ext.  She reports mindfulness with amb encouraging heel strike with each step.  Good response to land based intervention with some reports of discomfort after manual but improved knee flexibility.  Gait improvement with left heel strike, continues to be unable to heel strike right. Pt is negotiating  stairs into and out of pool requiring heavy ue support but improved from  requiring lift to exit. She is tolerating aquatic session without excessive pain or fatigue.  She has been amb to and from setting but with difficulty requiring rest periods. Pt with poor toleration to hip abd today due to left knee discomfort.  Fairly good toleration to session.  STGs addressed. Goals ongoing       Eval: Patient is a 58 y.o. f who was seen today for physical therapy evaluation and treatment for bilat knee OA. She presents today with husband assisting she is using a rollator.  May require wc assistance for therapy sessions.  Exam reveals significant bilateral knee flex contractures, limited knee flex, strength deficits, balance and proprioception deficits. Her gait is greatly impeded as she is unable to strike her heels and doesn't clear her feet from the floor/shuffles. She is a high fall risk due to posture and gait deficits.  She will benefit from skilled physical therapy both aquatic and land.  Will plan on focusing initially in aquatics on pain management, movement toleration improving ROM and strengthening then progress fairly quickly to alternating with land. Goals to improve all deficits which are significantly affecting her functional mobility and ADL's  OBJECTIVE IMPAIRMENTS: Abnormal gait, decreased activity tolerance, decreased balance, decreased coordination, decreased endurance, decreased knowledge of condition, decreased knowledge of use of DME, decreased mobility, difficulty walking, decreased ROM, decreased strength, impaired flexibility, postural dysfunction, obesity, and pain.   ACTIVITY LIMITATIONS: carrying, lifting, bending, sitting, standing, squatting, sleeping, stairs, transfers, hygiene/grooming, and locomotion level  PARTICIPATION LIMITATIONS: meal prep, cleaning, laundry, driving, shopping, community activity, occupation, and yard work  PERSONAL FACTORS: Behavior pattern, Fitness, Past/current experiences, and Time since onset of  injury/illness/exacerbation are also affecting patient's functional outcome.   REHAB POTENTIAL: Good  CLINICAL DECISION MAKING: Evolving/moderate complexity  EVALUATION COMPLEXITY: Moderate   GOALS: Goals reviewed with patient? Yes  SHORT TERM GOALS: Target date: 10/11/23 Pt will tolerate full aquatic sessions consistently without increase in pain and with improving function to demonstrate good toleration and effectiveness of intervention.  Baseline: Goal status:Met 10/14/23  2.  Pt will tolerate walking to and from setting and engaging in aquatic therapy session without excessive fatigue or increase in pain to demonstrate improved toleration to activity. Baseline: unable Goal status: In progress 10/14/23  3.  Pt will be indep with stair negotiation into and out of pool consistently using hand rail Baseline:  Goal status: Met 10/14/23    4. Pt to verbalize understanding of the importance of exercise/movement on overall well being.    Baseline: immobile for up towards 1 year    Goal status:Met 10/14/23   LONG TERM GOALS: Target date: 11/08/23  Pt to improve on LEFS by at least 9 point to demonstrate statistically significant Improvement in function. Baseline: 12/80 Goal status: INITIAL  2.  Pt will report decrease in pain by at least 50% for improved toleration to activity/quality of life and to demonstrate improved management of pain. Baseline:  Goal status: INITIAL  3.  Pt will improve strength in knee extension by at least 10 lbs to demonstrate improved overall physical function Baseline:  Goal status: INITIAL  4.  Pt to improve bilateral knee extension by 50% for improved gait. Baseline:  Goal status: INITIAL    5. Pt will be indep with final HEP's (land and aquatic as appropriate) for continued management of condition    Baseline:    Goal status: INITIAL  PLAN:  PT FREQUENCY: 2x/week  PT DURATION: 8 weeks  PLANNED INTERVENTIONS: 97164- PT Re-evaluation,  97110-Therapeutic exercises, 97530- Therapeutic activity, [id number]- Neuromuscular re-education, 97535- Self Care, [id number]- Manual therapy, [id number]- Gait training, [phone]- Orthotic Initial, [phone]- Aquatic Therapy, [phone]- Ionotophoresis 4mg /ml Dexamethasone , Patient/Family education, Balance training, Stair training, Taping, Dry Needling, Joint mobilization, DME instructions, Cryotherapy, and Moist heat  PLAN FOR NEXT SESSION: alternate land/aquatics for Knee ROM/strengthening; gait; balance an proprioception; Review HEP  Ronal Foots) Azariel Banik MPT 10/14/23 9:38 AM [facility] [address] [place], [place], [address] Phone: [phone]   Fax:  [phone]  Addend Ronal Foots) Elpidia Karn MPT 10/16/23 9:25 AM [facility] [address] [place], [place], [address] Phone: [phone]   Fax:  [phone]    Date of referral: 08/09/23 Referring provider: Maude Graves MD Referring diagnosis? Bilat knee OA Treatment diagnosis? (if different than referring diagnosis) no  What was this (referring dx) caused by? Arthritis  Lysle of Condition: Chronic (continuous duration > 3 months)   Laterality: Both  Current Functional Measure Score: LEFS 12/80  Objective measurements identify impairments when they are compared to normal values, the uninvolved extremity, and prior level of function.  [x]  Yes  []  No  Objective assessment of functional ability: Severe functional limitations   Briefly describe symptoms: Bilat knee pain, flexion contractures, gait abnormality, strength deficits  How did symptoms start: OA  Average pain intensity:  Last 24 hours: 3/10  Past week: 3/10  How often does the pt experience symptoms? Constantly  How much have the symptoms interfered with usual daily activities? Quite a bit  How has condition changed since care began at this facility? NA - initial visit  In  general, how is the patients overall health? Good   BACK PAIN (STarT Back Screening Tool) No

## 2023-10-16 IMAGING — CR DG CHEST 2V
2 series · 2 of 2 positions shown · non-contrast
Comparison: September 01, 2015

CLINICAL DATA: Cough.  Shortness of breath.

EXAM:
CHEST - 2 VIEW

[w chest pa]
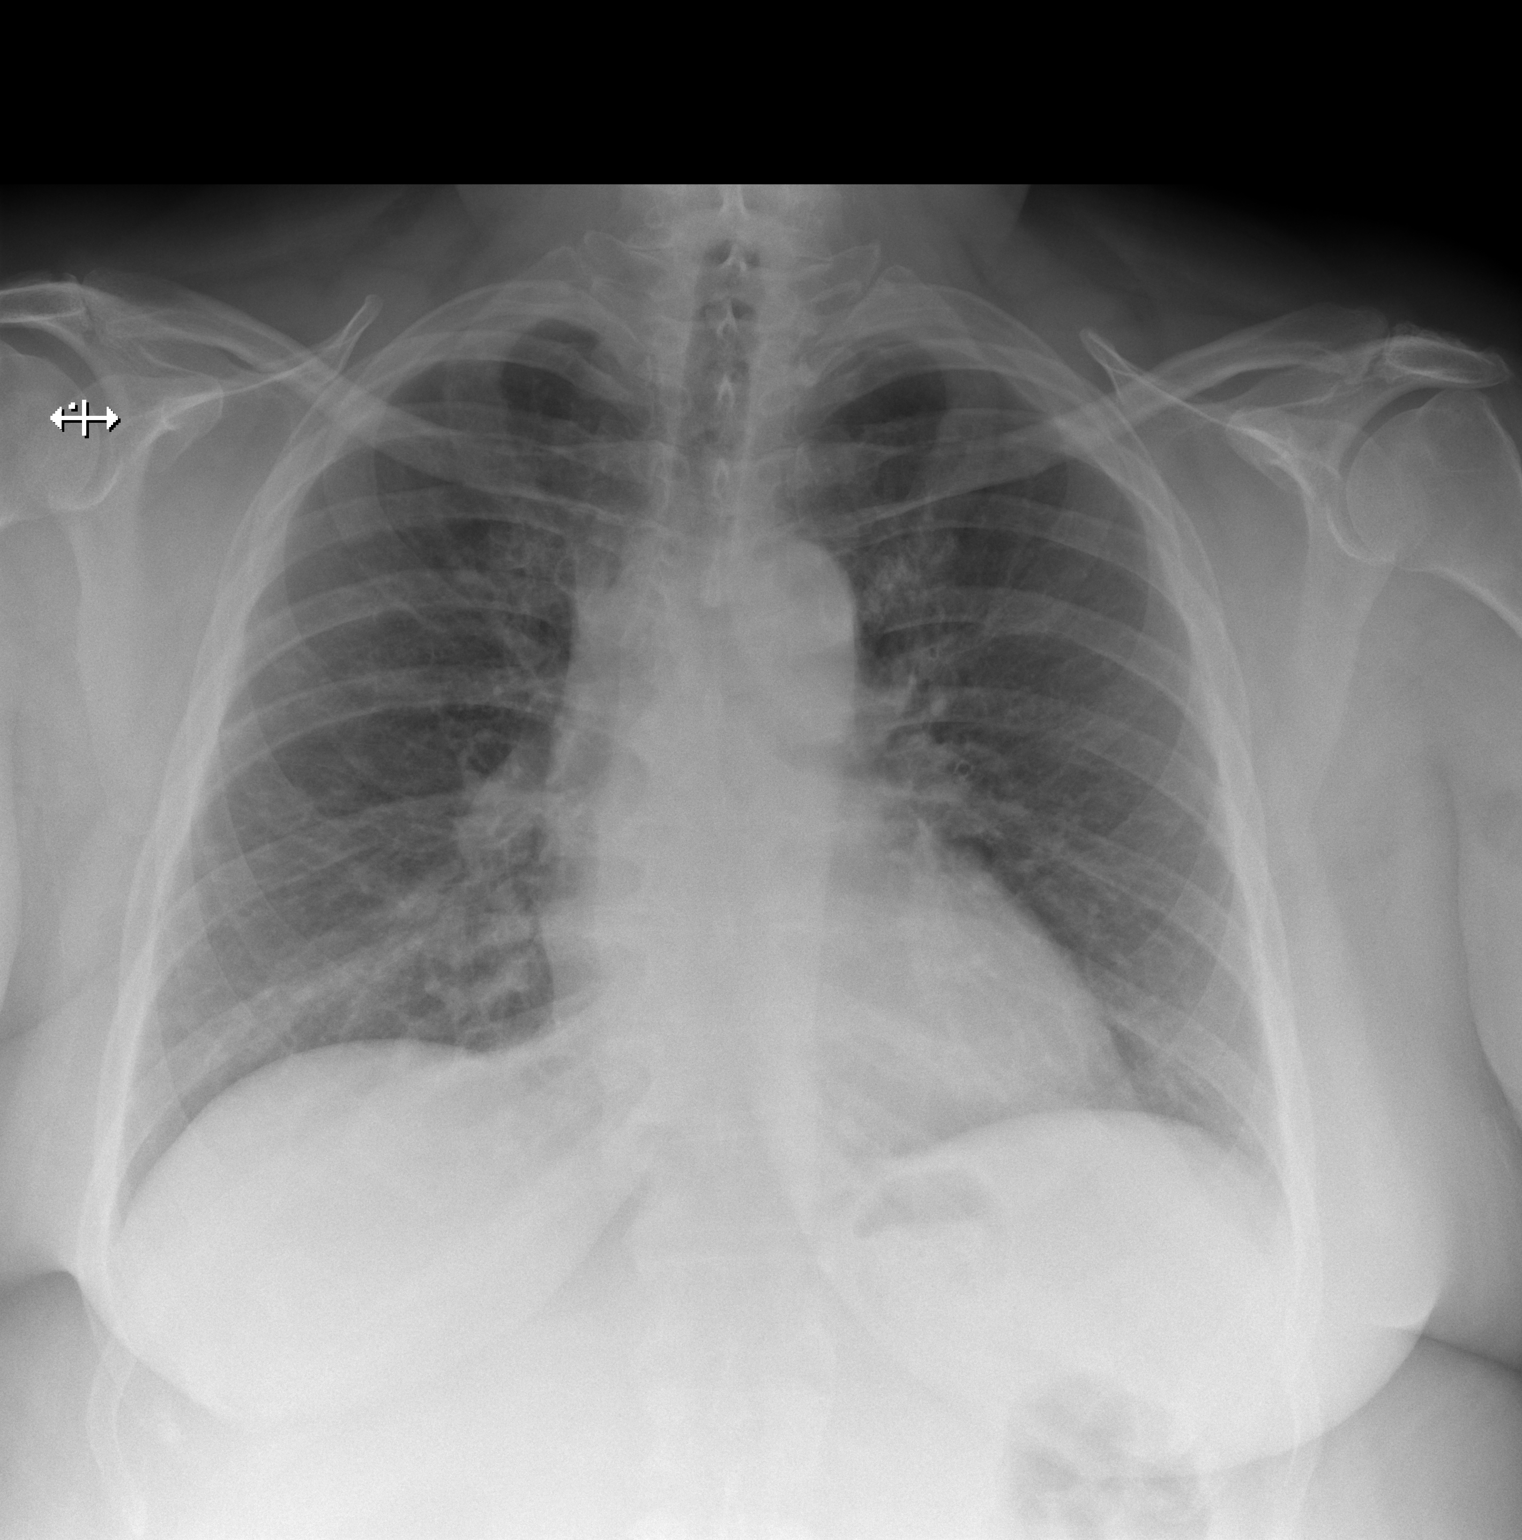

[w chest lat]
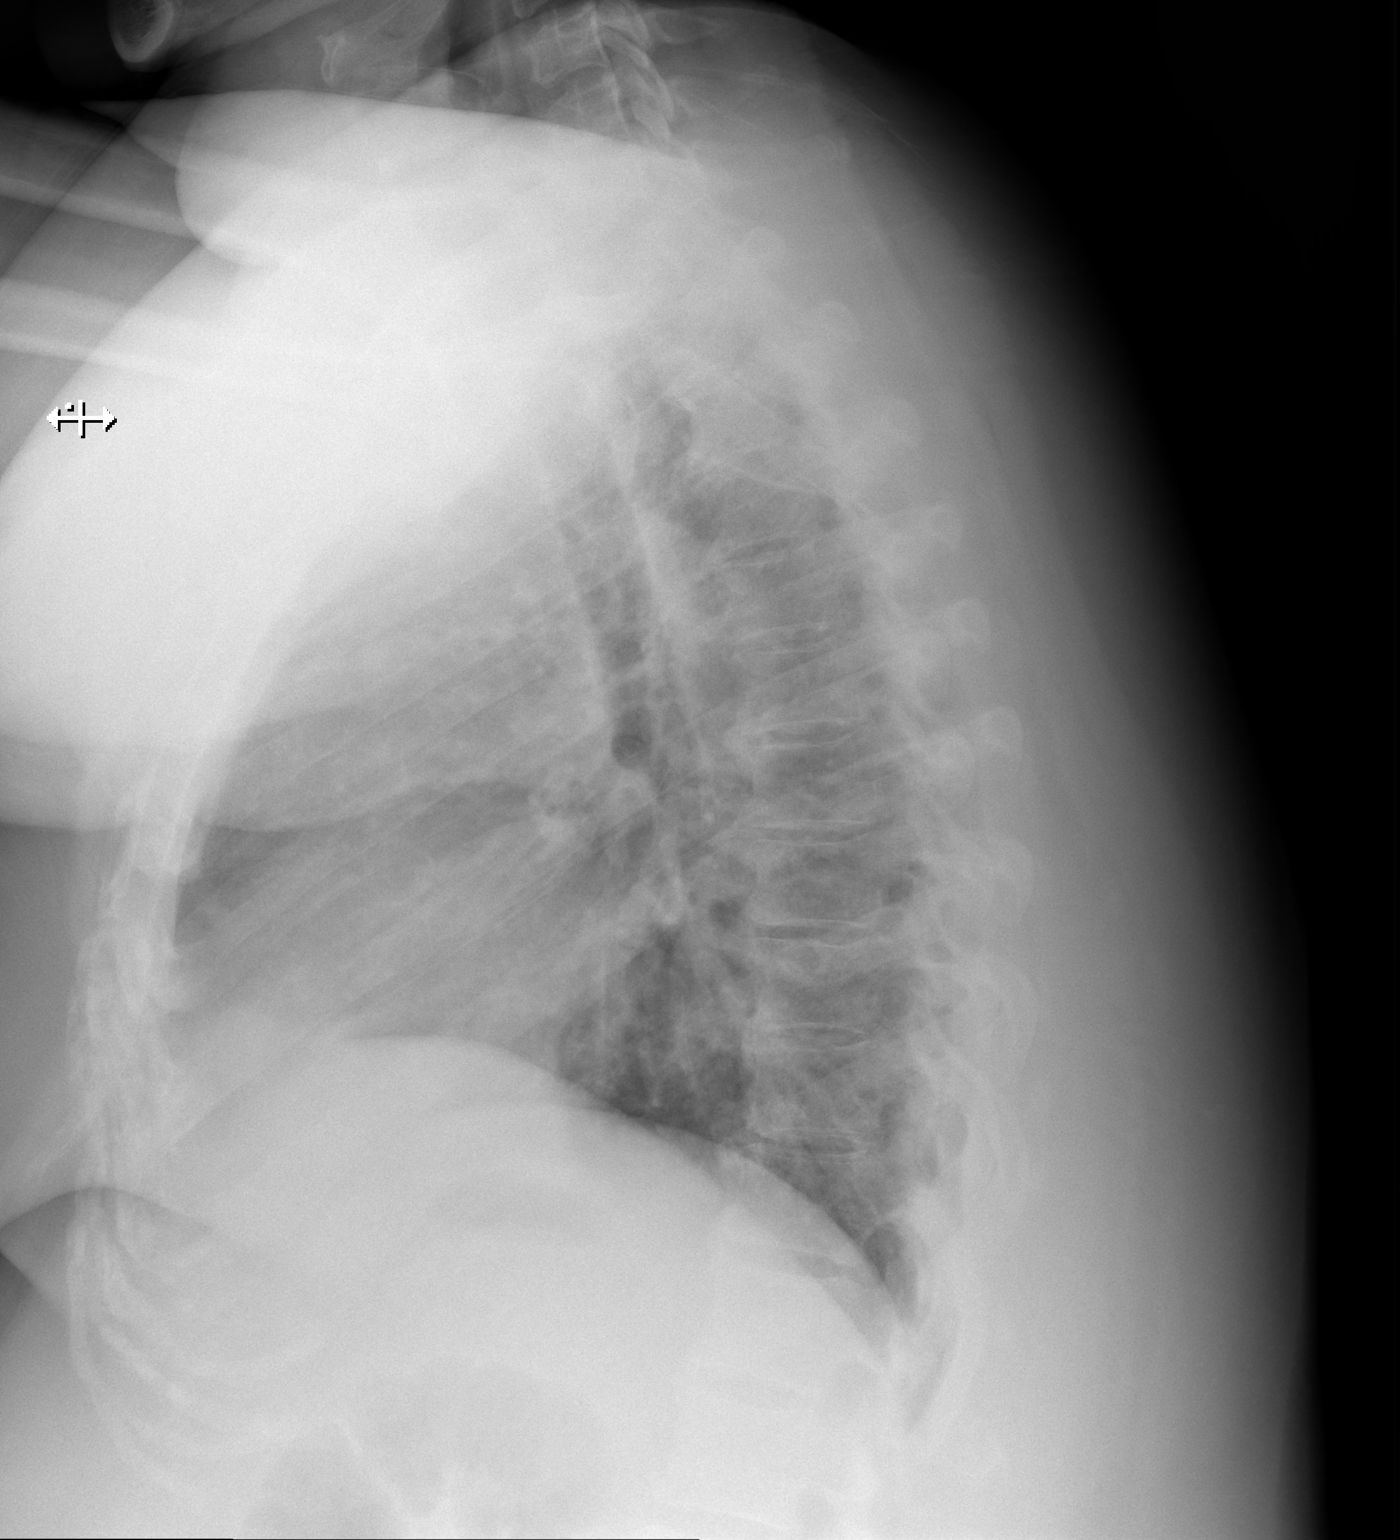

[2 of 2 positions shown; findings below may reference images not displayed]

FINDINGS: The heart size and mediastinal contours are within normal limits.
Both lungs are clear. The visualized skeletal structures are
unremarkable.
IMPRESSION: No active cardiopulmonary disease.

## 2023-10-17 ENCOUNTER — Encounter (HOSPITAL_BASED_OUTPATIENT_CLINIC_OR_DEPARTMENT_OTHER): Payer: Self-pay

## 2023-10-17 ENCOUNTER — Ambulatory Visit (HOSPITAL_BASED_OUTPATIENT_CLINIC_OR_DEPARTMENT_OTHER)

## 2023-10-17 DIAGNOSIS — M6281 Muscle weakness (generalized): Secondary | ICD-10-CM | POA: Diagnosis not present

## 2023-10-17 DIAGNOSIS — R2689 Other abnormalities of gait and mobility: Secondary | ICD-10-CM | POA: Diagnosis not present

## 2023-10-17 DIAGNOSIS — G8929 Other chronic pain: Secondary | ICD-10-CM

## 2023-10-17 DIAGNOSIS — M25561 Pain in right knee: Secondary | ICD-10-CM | POA: Diagnosis not present

## 2023-10-17 DIAGNOSIS — R293 Abnormal posture: Secondary | ICD-10-CM | POA: Diagnosis not present

## 2023-10-17 DIAGNOSIS — M25562 Pain in left knee: Secondary | ICD-10-CM | POA: Diagnosis not present

## 2023-10-17 NOTE — Therapy (Signed)
 OUTPATIENT PHYSICAL THERAPY LOWER EXTREMITY TREATMENT       Patient Name: [name] Wright MRN: [id number] DOB:[birth year], 58 y.o., female Today's Date: 10/17/2023  END OF SESSION:  PT End of Session - 10/17/23 1202     Visit Number 10    Date for PT Re-Evaluation 11/08/23    Authorization Type [employer] mcr approved 16 visits 6/3 - 7-29    Progress Note Due on Visit 19    PT Start Time 1149    PT Stop Time 1232    PT Time Calculation (min) 43 min    Activity Tolerance Patient tolerated treatment well    Behavior During Therapy [facility] for tasks assessed/performed               Past Medical History:  Diagnosis Date   Acute cystitis    Allergic rhinitis    Childhood asthma    no problems as adult - no inhaler   Chronic insomnia    Chronic pain due to trauma 02/2001   closed head trauma - Followed by Dr Elspeth Mems [facility]   Complication of anesthesia    Dyspepsia    Family history of ovarian cancer    Head trauma 02/24/2001   closed   History of kidney stones    passed stone - no surgery required   Hypertension    Hypothyroidism    PONV (postoperative nausea and vomiting)    Pre-diabetes    Sleep apnea    Pt denies   SVD (spontaneous vaginal delivery)    fetal demise at 5 months   Past Surgical History:  Procedure Laterality Date   APPENDECTOMY     BIOPSY  08/25/2020   Procedure: BIOPSY;  Surgeon: San Sandor GAILS, DO;  Location: [facility];  Service: Gastroenterology;;   COLONOSCOPY WITH PROPOFOL  N/A 08/25/2020   Procedure: COLONOSCOPY WITH PROPOFOL ;  Surgeon: San Sandor GAILS, DO;  Location: [facility];  Service: Gastroenterology;  Laterality: N/A;   DILATATION & CURETTAGE/HYSTEROSCOPY WITH MYOSURE N/A 04/15/2017   Procedure: DILATATION & CURETTAGE/HYSTEROSCOPY WITH MYOSURE POLYPECTOMY;  Surgeon: Curlene Agent, MD;  Location: [facility];  Service: Gynecology;  Laterality: N/A;   DILATATION & CURETTAGE/HYSTEROSCOPY WITH MYOSURE N/A  06/18/2019   Procedure: DILATATION & CURETTAGE/HYSTEROSCOPY WITH  MYOSURE;  Surgeon: Curlene Agent, MD;  Location: [facility];  Service: Gynecology;  Laterality: N/A;   DILATATION & CURETTAGE/HYSTEROSCOPY WITH MYOSURE N/A 06/07/2023   Procedure: DILATATION & CURETTAGE/HYSTEROSCOPY WITH  MYOSURE;  Surgeon: Curlene Agent, MD;  Location: [facility] OR;  Service: Gynecology;  Laterality: N/A;  POSSIBLE MYOSURE [facility]   ESOPHAGOGASTRODUODENOSCOPY (EGD) WITH PROPOFOL  N/A 08/25/2020   Procedure: ESOPHAGOGASTRODUODENOSCOPY (EGD) WITH PROPOFOL ;  Surgeon: San Sandor GAILS, DO;  Location: [facility];  Service: Gastroenterology;  Laterality: N/A;   EYE SURGERY     cataracts removed   INCONTINENCE SURGERY     PILONIDAL CYST EXCISION     x2   POLYPECTOMY  08/25/2020   Procedure: POLYPECTOMY;  Surgeon: San Sandor GAILS, DO;  Location: [facility];  Service: Gastroenterology;;   repair of toe laceration     dermabond to right big toe right foot   repair of torn ligament right leg     patient denies this surgery   right foot fracture     cast only   TONSILLECTOMY     WISDOM TOOTH EXTRACTION     Patient Active Problem List   Diagnosis Date Noted   Urgency of urination 09/26/2023   Vertigo 08/19/2023  Swimmer's ear 08/19/2023   Rash and nonspecific skin eruption 08/19/2023   Chronic pain of both knees 08/17/2023   Thrush, oral 07/07/2023   Female climacteric state 05/14/2023   Pre-op exam 05/14/2023   Class 3 severe obesity due to excess calories with body mass index (BMI) of 50.0 to 59.9 in adult 03/25/2023   Primary hypothyroidism 03/25/2023   Otalgia, right ear 03/25/2023   Vitamin D  deficiency 02/06/2023   Vitamin B12 deficiency 02/06/2023   Urinary frequency 05/27/2022   Leukocytes in urine 05/27/2022   Costochondritis 05/14/2022   Genetic testing 03/08/2021   Family history of ovarian cancer 02/16/2021   Uncontrolled type 2 diabetes mellitus with hyperglycemia (HCC) 02/07/2021   Essential  hypertension, benign 02/07/2021   Gastritis and gastroduodenitis    Nausea without vomiting    Adenomatous polyp of ascending colon    Adenomatous polyp of transverse colon    Adenomatous polyp of sigmoid colon    Rectal polyp    Adenomatous polyp of descending colon    Gall bladder pain 11/04/2019   Class 3 severe obesity due to excess calories with serious comorbidity and body mass index (BMI) of 60.0 to 69.9 in adult 08/13/2019   Nasal congestion 03/10/2019   Sinus pressure 03/10/2019   Hyperglycemia 01/22/2019   Hypertriglyceridemia 01/22/2019   Combined forms of age-related cataract of left eye 09/10/2017   Musculoskeletal neck pain 07/24/2017   Migraine without aura and without status migrainosus, not intractable 11/12/2016   Encounter for preventative adult health care exam with abnormal findings 09/01/2015   Sinusitis, chronic 09/01/2015   Cough 09/01/2015   Morbid obesity with BMI of 50.0-59.9, adult (HCC) 11/18/2014   RUQ pain 10/27/2014   Urinary incontinence 09/17/2013   Acute sinus infection 10/16/2012   Paresthesias 09/22/2012   Sleep apnea, obstructive 09/22/2012   Elevated BP 07/29/2012   Fatigue 07/10/2012   Anxiety 06/02/2012   Closed head injury 06/02/2012   Depression 06/02/2012   Facial pain 06/02/2012   Leg cramps 04/15/2011   Edema 12/21/2010   Chronic pain due to trauma 03/15/2007   Extrinsic asthma 03/15/2007   INSOMNIA, CHRONIC 11/01/2006   Allergic rhinitis 11/01/2006   DYSPEPSIA, CHRONIC 11/01/2006   Head injury 11/01/2006    PCP: Catheryn Slocumb MD  REFERRING PROVIDER:   Sheril Coy, MD    REFERRING DIAG: M17.0 (ICD-10-CM) - Degenerative arthritis of knee, bilateral   THERAPY DIAG:  Bilateral chronic knee pain  Muscle weakness (generalized)  Abnormal posture  Other abnormalities of gait and mobility  Rationale for Evaluation and Treatment: Rehabilitation  ONSET DATE: 1 year  SUBJECTIVE:   SUBJECTIVE STATEMENT: Pt  reports increased stiffness after driving a different car. Has purchased a Scientist, clinical (histocompatibility and immunogenetics) and feels this has really helped.   POOL ACCESS: currently none.   Has done aquatic therapy in the past.   eval: Want to avoid having TKR.  Bone spurs are on the back of my knee caps.  Twisted my knee about 1 years ago.  Have been using rollator on and off for past 6 months. Can't straighten my knee. Gel shots have helped.  Just had injections last week.  Have a few more scheduled.  Had a surgery in last few months and I have been somewhat immobile not moving much at all.. Sleep for about 4 hours before waking  PERTINENT HISTORY: TBI 2008/2014 PAIN:  Are you having pain? Yes: NPRS scale: current 2/10 stiffness R>L Pain location: bilat knee joint Pain description: ache Aggravating factors: weight  bearing Relieving factors: lidocaine  patches, OTC meds, mag cream  PRECAUTIONS: None  RED FLAGS: None   WEIGHT BEARING RESTRICTIONS: No  FALLS:  Has patient fallen in last 6 months? No tripped walking into the house  LIVING ENVIRONMENT: Lives with: lives with their family Lives in: House/apartment Stairs: 6 step into home Has following equipment at home: Single point cane and rollator and electric scooterlift chair, shower bench  OCCUPATION: disables  PLOF: Independent uses shower benches  PATIENT GOALS: build stamina, increase strength  NEXT MD VISIT: Friday  OBJECTIVE:  Note: Objective measures were completed at Evaluation unless otherwise noted.  DIAGNOSTIC FINDINGS: none in chart  PATIENT SURVEYS:  LEFS 12/80  COGNITION: Overall cognitive status: Within functional limits for tasks assessed     SENSATION: WFL   MUSCLE LENGTH: Hamstrings:shortened bilaterally   POSTURE: rounded shoulders, forward head, and knee flexed trunk forward flexed.  PALPATION: Moderate TTP about knees bilat joint lines medial> Lateral  LOWER EXTREMITY ROM:  Active ROM Right eval Left eval  Hip  flexion    Hip extension    Hip abduction    Hip adduction    Hip internal rotation    Hip external rotation    Knee flexion 100d 103  Knee extension -40 -34  Ankle dorsiflexion    Ankle plantarflexion    Ankle inversion    Ankle eversion     (Blank rows = not tested)  LOWER EXTREMITY MMT:  HD (lb) Tested in sitting Right eval Left eval  Hip flexion 44.4 45.9  Hip extension    Hip abduction 27.1 25.9  Hip adduction    Hip internal rotation    Hip external rotation    Knee flexion    Knee extension 33.1 25.6  Ankle dorsiflexion    Ankle plantarflexion    Ankle inversion    Ankle eversion     (Blank rows = not tested)   FUNCTIONAL TESTS:  Timed up and go (TUG): 23.91 STS transfers: heavy ue support from pool bench; forward momentum with ue elevated   GAIT: Distance walked: 100 ft Assistive device utilized: rollator Level of assistance: SBA Comments: bilateral flex contractures: no heel strike, sliding on toes when advancing LE during swing, forward flex trunk posture                                                                                                                                TREATMENT    10/17/23 STM to distal quad and lateral HS end of session x34min SAQ 3 second hold 1# x10, 0# x10 Supine SLR 2x10ea Quad set pressing into ball under knee 5 2x10ea Seated HSS with heel on 6 Step 2x30sec each Gait in hall with rollator 20ft, seated rest break, 41ft Standing calf stretch (back of nu-step) 30sec x2ea   [facility] Adult PT Treatment:  DATE: 10/14/23 Pt seen for aquatic therapy today.  Treatment took place in water 3.5-4.75 ft in depth at the [facility] pool. Temp of water was 91.  Pt entered and exited the pool via stairs using step-to pattern with heavy UE on hand rail.  * Unsupported: walking forward/backward and side stepping *side stepping yellow HB UE add/abd->slight lunge as tolerated *R  and L hamstring then quad stretch at steps  *Gastroc stretch bottom step *standing UE support  yellow HB: df; hip add/abd ( not tolerated RLE due to increased pain); hip flex/ext *UE support wall 3.27ft : relaxed squats ; PF *Forward marching with knee kicks ue support yellow HB *Gait training with cues for heel strike *cycling ue support on yellow noodle * UE on corner in deeper water (no floatation support):hip add/abd; hip flex/ext  Pt requires the buoyancy and hydrostatic pressure of water for support, and to offload joints by unweighting joint load by at least 50 % in navel deep water and by at least 75-80% in chest to neck deep water.  Viscosity of the water is needed for resistance of strengthening. Water current perturbations provides challenge to standing balance requiring increased core activation.   10/10/23 STM to posterior knee/distal HS Heel prop with 1/2 roll - 15 seconds on R LE, on L LE SAQ 2x10ea Supine SLR 2x10ea Gait in hall with rollator 41ft, seated rest break, 4ft Standing calf stretch (back of nu-step) 20sec x2ea     [facility] Adult PT Treatment:                                                DATE: 10/08/23 Pt seen for aquatic therapy today.  Treatment took place in water 3.5-4.75 ft in depth at the [facility] pool. Temp of water was 91.  Pt entered and exited the pool via stairs using step-to pattern with heavy UE on hand rail.  * UE on barbell: walking forward/backward and side stepping *standing UE support wall 3.51ft (attempted some completion off of wall but not tolerated): heel/toe raises x 10; hip add/abd x 10; relaxed squats x 10; leg swings into hip flex/ext x 10 *Gait training with cues for heel strike *side stepping yellow HB UE add/abd->slight lunge as tolerated *R and L hamstring then quad stretch at steps  *cycling ue support on yellow noodle * UE on corner in deeper water (no floatation support):hip add/abd; hip flex/ext  Pt requires  the buoyancy and hydrostatic pressure of water for support, and to offload joints by unweighting joint load by at least 50 % in navel deep water and by at least 75-80% in chest to neck deep water.  Viscosity of the water is needed for resistance of strengthening. Water current perturbations provides challenge to standing balance requiring increased core activation.     [facility] Adult PT Treatment:                                                DATE: 10/02/23 Pt seen for aquatic therapy today.  Treatment took place in water 3.5-4.75 ft in depth at the [facility] pool. Temp of water was 91.  Pt entered and exited the pool via stairs using step-to pattern with  heavy UE on hand rail.  * UE on barbell: walking forward/backward  * UE on barbell: side stepping -> with arm addct/ abdct with yellow hand floats * walking forward with reciprocal arm swing with light resistance bells x 3 laps (cues for heel strike and flexed knee during swing through) and backwards  *cycling with support yellow noodle (under arms) - not tolerated in right knee - stopped *standing UE support wall 4.40ft: heel/toe raises x 10; hip add/abd x 10; relaxed squats x 5; leg swings into hip flex/ext x 5  * UE on corner in deeper water (no floatation support):  cycling (no pain) * R/L hamstring stretch with heel on 2nd step x 15s x 2 each LE  09/30/23 SAQ unilat x 15 Quad set off bolster x 15 Iso SAQ with ankle pump x 15 Hamstring digs x 20  Ball squeeze x 20 Ball squeeze with bridge x 20 SLR x 15 STW/deep tissue work as tolerated to [employer] Tib and [employer] bil Manual Gastroc/Ant Tib stretch bil  6/20  Manual:  Trigger point release to bilateral hamstringing  Extension stretching with distraction  Patella mobilization  Grade II and III PA and AP mobilization   There-ex:  Quad set 2x8 with cuing for technique and home set up  Hamstring stretch 2x30 sec bilateral  Gastroc stretch seated 2x30 sec hold      09/17/23 [facility] Adult PT Treatment:                                                DATE: 09/17/23 Pt seen for aquatic therapy today.  Treatment took place in water 3.5-4.75 ft in depth at the [facility] pool. Temp of water was 91.  Pt entered and exits the pool via stairs using step to pattern with hand rail, exited via lift for pain management purposes  *Intro to setting *walking forward, back and side stepping with ue support of noodle 4.7 ft *cycling with support yellow noodle *marching forward ue support noodle *standing ue support wall 4.31ft: df, pf; hip add/abd x 10; relaxed squats x 5 (right knee popped with discomfort ended exercise) *seated on lift: hip add/abd; knee flex/ext.  Pt requires the buoyancy and hydrostatic pressure of water for support, and to offload joints by unweighting joint load by at least 50 % in navel deep water and by at least 75-80% in chest to neck deep water.  Viscosity of the water is needed for resistance of strengthening. Water current perturbations provides challenge to standing balance requiring increased core activation.    09/16/23 Contract relax for bil HS 5 holds- ball placed under opp knee for support - STM to tibialis anterior in supine (ball under knee)  -passive stretching of tib anterior, HS -LAQ 2x10ea     PATIENT EDUCATION:  Education details: Discussed eval findings, rehab rationale, aquatic program progression/POC and pools in area. Patient is in agreement  Person educated: Patient Education method: Explanation Education comprehension: verbalized understanding  HOME EXERCISE PROGRAM: TBA  ASSESSMENT:  CLINICAL IMPRESSION: Continue to address knee extension as well as LE strength today.  Patient with good response to addition of light resistance with SAQ. Following plinth exercises, she reported improved ability with letting LE's straighten in supine position.  While walking in hall with rollator PTA cued patient to  keep close to walker to avoid  lumbar flexion.  She did experience incidents of right knee discomfort when performing standing gastroc stretch.  She felt this at the distal quads which was relieved by STM. Will continue to work on improving knee extension and will progress as tolerated,   Eval: Patient is a 58 y.o. f who was seen today for physical therapy evaluation and treatment for bilat knee OA. She presents today with husband assisting she is using a rollator.  May require wc assistance for therapy sessions.  Exam reveals significant bilateral knee flex contractures, limited knee flex, strength deficits, balance and proprioception deficits. Her gait is greatly impeded as she is unable to strike her heels and doesn't clear her feet from the floor/shuffles. She is a high fall risk due to posture and gait deficits.  She will benefit from skilled physical therapy both aquatic and land.  Will plan on focusing initially in aquatics on pain management, movement toleration improving ROM and strengthening then progress fairly quickly to alternating with land. Goals to improve all deficits which are significantly affecting her functional mobility and ADL's  OBJECTIVE IMPAIRMENTS: Abnormal gait, decreased activity tolerance, decreased balance, decreased coordination, decreased endurance, decreased knowledge of condition, decreased knowledge of use of DME, decreased mobility, difficulty walking, decreased ROM, decreased strength, impaired flexibility, postural dysfunction, obesity, and pain.   ACTIVITY LIMITATIONS: carrying, lifting, bending, sitting, standing, squatting, sleeping, stairs, transfers, hygiene/grooming, and locomotion level  PARTICIPATION LIMITATIONS: meal prep, cleaning, laundry, driving, shopping, community activity, occupation, and yard work  PERSONAL FACTORS: Behavior pattern, Fitness, Past/current experiences, and Time since onset of injury/illness/exacerbation are also affecting patient's  functional outcome.   REHAB POTENTIAL: Good  CLINICAL DECISION MAKING: Evolving/moderate complexity  EVALUATION COMPLEXITY: Moderate   GOALS: Goals reviewed with patient? Yes  SHORT TERM GOALS: Target date: 10/11/23 Pt will tolerate full aquatic sessions consistently without increase in pain and with improving function to demonstrate good toleration and effectiveness of intervention.  Baseline: Goal status:Met 10/14/23  2.  Pt will tolerate walking to and from setting and engaging in aquatic therapy session without excessive fatigue or increase in pain to demonstrate improved toleration to activity. Baseline: unable Goal status: In progress 10/14/23  3.  Pt will be indep with stair negotiation into and out of pool consistently using hand rail Baseline:  Goal status: Met 10/14/23    4. Pt to verbalize understanding of the importance of exercise/movement on overall well being.    Baseline: immobile for up towards 1 year    Goal status:Met 10/14/23   LONG TERM GOALS: Target date: 11/08/23  Pt to improve on LEFS by at least 9 point to demonstrate statistically significant Improvement in function. Baseline: 12/80 Goal status: INITIAL  2.  Pt will report decrease in pain by at least 50% for improved toleration to activity/quality of life and to demonstrate improved management of pain. Baseline:  Goal status: INITIAL  3.  Pt will improve strength in knee extension by at least 10 lbs to demonstrate improved overall physical function Baseline:  Goal status: INITIAL  4.  Pt to improve bilateral knee extension by 50% for improved gait. Baseline:  Goal status: INITIAL    5. Pt will be indep with final HEP's (land and aquatic as appropriate) for continued management of condition    Baseline:    Goal status: INITIAL   PLAN:  PT FREQUENCY: 2x/week  PT DURATION: 8 weeks  PLANNED INTERVENTIONS: 97164- PT Re-evaluation, 97110-Therapeutic exercises, 97530- Therapeutic activity, [id number]-  Neuromuscular re-education, 97535- Self Care, [id number]-  Manual therapy, [id number]- Gait training, 02239- Orthotic Initial, [phone]- Aquatic Therapy, [id number]- Ionotophoresis 4mg /ml Dexamethasone , Patient/Family education, Balance training, Stair training, Taping, Dry Needling, Joint mobilization, DME instructions, Cryotherapy, and Moist heat  PLAN FOR NEXT SESSION: alternate land/aquatics for Knee ROM/strengthening; gait; balance an proprioception; Review HEP  Ronal Foots) Ziemba MPT 10/17/23 3:55 PM [facility] [address] [place], [place], [address] Phone: [phone]   Fax:  [phone]  Addend Ronal Foots) Ziemba MPT 10/17/23 3:55 PM [facility] [address] [place], [place], [address] Phone: [phone]   Fax:  [phone]    Date of referral: 08/09/23 Referring provider: Maude Graves MD Referring diagnosis? Bilat knee OA Treatment diagnosis? (if different than referring diagnosis) no  What was this (referring dx) caused by? Arthritis  Lysle of Condition: Chronic (continuous duration > 3 months)   Laterality: Both  Current Functional Measure Score: LEFS 12/80  Objective measurements identify impairments when they are compared to normal values, the uninvolved extremity, and prior level of function.  [x]  Yes  []  No  Objective assessment of functional ability: Severe functional limitations   Briefly describe symptoms: Bilat knee pain, flexion contractures, gait abnormality, strength deficits  How did symptoms start: OA  Average pain intensity:  Last 24 hours: 3/10  Past week: 3/10  How often does the pt experience symptoms? Constantly  How much have the symptoms interfered with usual daily activities? Quite a bit  How has condition changed since care began at this facility? NA - initial visit  In general, how is the patients overall health? Good   BACK PAIN  (STarT Back Screening Tool) No

## 2023-10-21 ENCOUNTER — Encounter (HOSPITAL_BASED_OUTPATIENT_CLINIC_OR_DEPARTMENT_OTHER): Payer: Self-pay | Admitting: Physical Therapy

## 2023-10-21 ENCOUNTER — Ambulatory Visit (HOSPITAL_BASED_OUTPATIENT_CLINIC_OR_DEPARTMENT_OTHER): Admitting: Physical Therapy

## 2023-10-21 DIAGNOSIS — M25561 Pain in right knee: Secondary | ICD-10-CM | POA: Diagnosis not present

## 2023-10-21 DIAGNOSIS — M6281 Muscle weakness (generalized): Secondary | ICD-10-CM

## 2023-10-21 DIAGNOSIS — G8929 Other chronic pain: Secondary | ICD-10-CM | POA: Diagnosis not present

## 2023-10-21 DIAGNOSIS — R2689 Other abnormalities of gait and mobility: Secondary | ICD-10-CM | POA: Diagnosis not present

## 2023-10-21 DIAGNOSIS — M25562 Pain in left knee: Secondary | ICD-10-CM | POA: Diagnosis not present

## 2023-10-21 DIAGNOSIS — R293 Abnormal posture: Secondary | ICD-10-CM | POA: Diagnosis not present

## 2023-10-21 NOTE — Therapy (Signed)
 OUTPATIENT PHYSICAL THERAPY LOWER EXTREMITY TREATMENT   Patient Name: [name] Wright MRN: [id number] DOB:07/10/1965, 58 y.o., female Today's Date: 10/21/2023  END OF SESSION:  PT End of Session - 10/21/23 0943     Visit Number 11    Date for PT Re-Evaluation 11/08/23    Authorization Type [employer] mcr approved 16 visits 6/3 - 7-29    Authorization - Visit Number 11    Authorization - Number of Visits 16    Progress Note Due on Visit 19    PT Start Time 0930    PT Stop Time 1010    PT Time Calculation (min) 40 min    Behavior During Therapy [facility] for tasks assessed/performed            Past Medical History:  Diagnosis Date   Acute cystitis    Allergic rhinitis    Childhood asthma    no problems as adult - no inhaler   Chronic insomnia    Chronic pain due to trauma 02/2001   closed head trauma - Followed by Dr Elspeth Mems [facility]   Complication of anesthesia    Dyspepsia    Family history of ovarian cancer    Head trauma 02/24/2001   closed   History of kidney stones    passed stone - no surgery required   Hypertension    Hypothyroidism    PONV (postoperative nausea and vomiting)    Pre-diabetes    Sleep apnea    Pt denies   SVD (spontaneous vaginal delivery)    fetal demise at 5 months   Past Surgical History:  Procedure Laterality Date   APPENDECTOMY     BIOPSY  08/25/2020   Procedure: BIOPSY;  Surgeon: San Sandor GAILS, DO;  Location: [facility];  Service: Gastroenterology;;   COLONOSCOPY WITH PROPOFOL  N/A 08/25/2020   Procedure: COLONOSCOPY WITH PROPOFOL ;  Surgeon: San Sandor GAILS, DO;  Location: [facility];  Service: Gastroenterology;  Laterality: N/A;   DILATATION & CURETTAGE/HYSTEROSCOPY WITH MYOSURE N/A 04/15/2017   Procedure: DILATATION & CURETTAGE/HYSTEROSCOPY WITH MYOSURE POLYPECTOMY;  Surgeon: Curlene Agent, MD;  Location: [facility];  Service: Gynecology;  Laterality: N/A;   DILATATION & CURETTAGE/HYSTEROSCOPY WITH MYOSURE  N/A 06/18/2019   Procedure: DILATATION & CURETTAGE/HYSTEROSCOPY WITH  MYOSURE;  Surgeon: Curlene Agent, MD;  Location: [facility];  Service: Gynecology;  Laterality: N/A;   DILATATION & CURETTAGE/HYSTEROSCOPY WITH MYOSURE N/A 06/07/2023   Procedure: DILATATION & CURETTAGE/HYSTEROSCOPY WITH  MYOSURE;  Surgeon: Curlene Agent, MD;  Location: [facility] OR;  Service: Gynecology;  Laterality: N/A;  POSSIBLE MYOSURE [facility]   ESOPHAGOGASTRODUODENOSCOPY (EGD) WITH PROPOFOL  N/A 08/25/2020   Procedure: ESOPHAGOGASTRODUODENOSCOPY (EGD) WITH PROPOFOL ;  Surgeon: San Sandor GAILS, DO;  Location: [facility];  Service: Gastroenterology;  Laterality: N/A;   EYE SURGERY     cataracts removed   INCONTINENCE SURGERY     PILONIDAL CYST EXCISION     x2   POLYPECTOMY  08/25/2020   Procedure: POLYPECTOMY;  Surgeon: San Sandor GAILS, DO;  Location: [facility];  Service: Gastroenterology;;   repair of toe laceration     dermabond to right big toe right foot   repair of torn ligament right leg     patient denies this surgery   right foot fracture     cast only   TONSILLECTOMY     WISDOM TOOTH EXTRACTION     Patient Active Problem List   Diagnosis Date Noted   Urgency of urination 09/26/2023   Vertigo 08/19/2023  Swimmer's ear 08/19/2023   Rash and nonspecific skin eruption 08/19/2023   Chronic pain of both knees 08/17/2023   Thrush, oral 07/07/2023   Female climacteric state 05/14/2023   Pre-op exam 05/14/2023   Class 3 severe obesity due to excess calories with body mass index (BMI) of 50.0 to 59.9 in adult 03/25/2023   Primary hypothyroidism 03/25/2023   Otalgia, right ear 03/25/2023   Vitamin D  deficiency 02/06/2023   Vitamin B12 deficiency 02/06/2023   Urinary frequency 05/27/2022   Leukocytes in urine 05/27/2022   Costochondritis 05/14/2022   Genetic testing 03/08/2021   Family history of ovarian cancer 02/16/2021   Uncontrolled type 2 diabetes mellitus with hyperglycemia (HCC) 02/07/2021   Essential  hypertension, benign 02/07/2021   Gastritis and gastroduodenitis    Nausea without vomiting    Adenomatous polyp of ascending colon    Adenomatous polyp of transverse colon    Adenomatous polyp of sigmoid colon    Rectal polyp    Adenomatous polyp of descending colon    Gall bladder pain 11/04/2019   Class 3 severe obesity due to excess calories with serious comorbidity and body mass index (BMI) of 60.0 to 69.9 in adult 08/13/2019   Nasal congestion 03/10/2019   Sinus pressure 03/10/2019   Hyperglycemia 01/22/2019   Hypertriglyceridemia 01/22/2019   Combined forms of age-related cataract of left eye 09/10/2017   Musculoskeletal neck pain 07/24/2017   Migraine without aura and without status migrainosus, not intractable 11/12/2016   Encounter for preventative adult health care exam with abnormal findings 09/01/2015   Sinusitis, chronic 09/01/2015   Cough 09/01/2015   Morbid obesity with BMI of 50.0-59.9, adult (HCC) 11/18/2014   RUQ pain 10/27/2014   Urinary incontinence 09/17/2013   Acute sinus infection 10/16/2012   Paresthesias 09/22/2012   Sleep apnea, obstructive 09/22/2012   Elevated BP 07/29/2012   Fatigue 07/10/2012   Anxiety 06/02/2012   Closed head injury 06/02/2012   Depression 06/02/2012   Facial pain 06/02/2012   Leg cramps 04/15/2011   Edema 12/21/2010   Chronic pain due to trauma 03/15/2007   Extrinsic asthma 03/15/2007   INSOMNIA, CHRONIC 11/01/2006   Allergic rhinitis 11/01/2006   DYSPEPSIA, CHRONIC 11/01/2006   Head injury 11/01/2006    PCP: Catheryn Slocumb MD  REFERRING PROVIDER:   Sheril Coy, MD    REFERRING DIAG: M17.0 (ICD-10-CM) - Degenerative arthritis of knee, bilateral   THERAPY DIAG:  Bilateral chronic knee pain  Muscle weakness (generalized)  Abnormal posture  Rationale for Evaluation and Treatment: Rehabilitation  ONSET DATE: 1 year  SUBJECTIVE:   SUBJECTIVE STATEMENT: Pt reports using the massager she purchased and feels  this has really helped.  Plans to check out the aquatic center this week.   POOL ACCESS: currently none.  Has [employer] that will pay for membership to aquatic center.  Has done aquatic therapy in the past.   eval: Want to avoid having TKR.  Bone spurs are on the back of my knee caps.  Twisted my knee about 1 years ago.  Have been using rollator on and off for past 6 months. Can't straighten my knee. Gel shots have helped.  Just had injections last week.  Have a few more scheduled.  Had a surgery in last few months and I have been somewhat immobile not moving much at all.. Sleep for about 4 hours before waking  PERTINENT HISTORY: TBI 2008/2014 PAIN:  Are you having pain? Yes: NPRS scale: current 3-4/10 Lt knee 6/10 Rt knee  Pain location: bilat knee joint Pain description: ache Aggravating factors: weight bearing Relieving factors: lidocaine  patches, OTC meds, mag cream  PRECAUTIONS: None  RED FLAGS: None   WEIGHT BEARING RESTRICTIONS: No  FALLS:  Has patient fallen in last 6 months? No tripped walking into the house  LIVING ENVIRONMENT: Lives with: lives with their family Lives in: House/apartment Stairs: 6 step into home Has following equipment at home: Single point cane and rollator and electric scooterlift chair, shower bench  OCCUPATION: disables  PLOF: Independent uses shower benches  PATIENT GOALS: build stamina, increase strength  NEXT MD VISIT: Friday  OBJECTIVE:  Note: Objective measures were completed at Evaluation unless otherwise noted.  DIAGNOSTIC FINDINGS: none in chart  PATIENT SURVEYS:  LEFS 12/80  COGNITION: Overall cognitive status: Within functional limits for tasks assessed     SENSATION: WFL   MUSCLE LENGTH: Hamstrings:shortened bilaterally   POSTURE: rounded shoulders, forward head, and knee flexed trunk forward flexed.  PALPATION: Moderate TTP about knees bilat joint lines medial> Lateral  LOWER EXTREMITY ROM:  Active  ROM Right eval Left eval  Hip flexion    Hip extension    Hip abduction    Hip adduction    Hip internal rotation    Hip external rotation    Knee flexion 100d 103  Knee extension -40 -34  Ankle dorsiflexion    Ankle plantarflexion    Ankle inversion    Ankle eversion     (Blank rows = not tested)  LOWER EXTREMITY MMT:  HD (lb) Tested in sitting Right eval Left eval  Hip flexion 44.4 45.9  Hip extension    Hip abduction 27.1 25.9  Hip adduction    Hip internal rotation    Hip external rotation    Knee flexion    Knee extension 33.1 25.6  Ankle dorsiflexion    Ankle plantarflexion    Ankle inversion    Ankle eversion     (Blank rows = not tested)   FUNCTIONAL TESTS:  Timed up and go (TUG): 23.91 STS transfers: heavy ue support from pool bench; forward momentum with ue elevated   GAIT: Distance walked: 100 ft Assistive device utilized: rollator Level of assistance: SBA Comments: bilateral flex contractures: no heel strike, sliding on toes when advancing LE during swing, forward flex trunk posture                                                                                                                                TREATMENT  [facility] Adult PT Treatment:                                                DATE: 10/21/23 Pt seen for aquatic therapy today.  Treatment took place in water 3.5-4.75 ft in depth at the [facility] pool.  Temp of water was 91.  Pt entered and exited the pool via stairs using step-to pattern with heavy UE on hand rail.  * Yellow hand floats under water:  walking forward/backward - multiple laps, cues for heel/toe and toe/ heel *side stepping yellow hand floats and add/abdct * marching forward - not tolerated * forward walking kicks  * UE on corner in deeper water (no floatation support):hip add/abd; hip flex/extension; cycling * seated on bench:  alternating LAQ; flutter kick; hip abdct/ add; clams *R and L hamstring  stretch  with foot on 2nd step, 20sec x 2 each LE * quad stretch at steps  *Gastroc stretch bottom, heels off    10/17/23 STM to distal quad and lateral HS end of session x106min SAQ 3 second hold 1# x10, 0# x10 Supine SLR 2x10ea Quad set pressing into ball under knee 5 2x10ea Seated HSS with heel on 6 Step 2x30sec each Gait in hall with rollator 15ft, seated rest break, 52ft Standing calf stretch (back of nu-step) 30sec x2ea   [facility] Adult PT Treatment:                                                DATE: 10/14/23 Pt seen for aquatic therapy today.  Treatment took place in water 3.5-4.75 ft in depth at the [facility] pool. Temp of water was 91.  Pt entered and exited the pool via stairs using step-to pattern with heavy UE on hand rail.  * Unsupported: walking forward/backward and side stepping *side stepping yellow HB UE add/abd->slight lunge as tolerated *R and L hamstring then quad stretch at steps  *Gastroc stretch bottom step *standing UE support  yellow HB: df; hip add/abd ( not tolerated RLE due to increased pain); hip flex/ext *UE support wall 3.90ft : relaxed squats ; PF *Forward marching with knee kicks ue support yellow HB *Gait training with cues for heel strike *cycling ue support on yellow noodle * UE on corner in deeper water (no floatation support):hip add/abd; hip flex/ext  10/10/23 STM to posterior knee/distal HS Heel prop with 1/2 roll - 15 seconds on R LE, on L LE SAQ 2x10ea Supine SLR 2x10ea Gait in hall with rollator 47ft, seated rest break, 71ft Standing calf stretch (back of nu-step) 20sec x2ea     [facility] Adult PT Treatment:                                                DATE: 10/08/23 Pt seen for aquatic therapy today.  Treatment took place in water 3.5-4.75 ft in depth at the [facility] pool. Temp of water was 91.  Pt entered and exited the pool via stairs using step-to pattern with heavy UE on hand rail.  * UE on barbell: walking  forward/backward and side stepping *standing UE support wall 3.18ft (attempted some completion off of wall but not tolerated): heel/toe raises x 10; hip add/abd x 10; relaxed squats x 10; leg swings into hip flex/ext x 10 *Gait training with cues for heel strike *side stepping yellow HB UE add/abd->slight lunge as tolerated *R and L hamstring then quad stretch at steps  *cycling ue support on yellow noodle * UE on corner in deeper water (  no floatation support):hip add/abd; hip flex/ext   [facility] Adult PT Treatment:                                                DATE: 10/02/23 Pt seen for aquatic therapy today.  Treatment took place in water 3.5-4.75 ft in depth at the [facility] pool. Temp of water was 91.  Pt entered and exited the pool via stairs using step-to pattern with heavy UE on hand rail.  * UE on barbell: walking forward/backward  * UE on barbell: side stepping -> with arm addct/ abdct with yellow hand floats * walking forward with reciprocal arm swing with light resistance bells x 3 laps (cues for heel strike and flexed knee during swing through) and backwards  *cycling with support yellow noodle (under arms) - not tolerated in right knee - stopped *standing UE support wall 4.20ft: heel/toe raises x 10; hip add/abd x 10; relaxed squats x 5; leg swings into hip flex/ext x 5  * UE on corner in deeper water (no floatation support):  cycling (no pain) * R/L hamstring stretch with heel on 2nd step x 15s x 2 each LE  09/30/23 SAQ unilat x 15 Quad set off bolster x 15 Iso SAQ with ankle pump x 15 Hamstring digs x 20  Ball squeeze x 20 Ball squeeze with bridge x 20 SLR x 15 STW/deep tissue work as tolerated to [employer] Tib and [employer] bil Manual Gastroc/Ant Tib stretch bil  6/20  Manual:  Trigger point release to bilateral hamstringing  Extension stretching with distraction  Patella mobilization  Grade II and III PA and AP mobilization   There-ex:  Quad set 2x8 with  cuing for technique and home set up  Hamstring stretch 2x30 sec bilateral  Gastroc stretch seated 2x30 sec hold     09/17/23 [facility] Adult PT Treatment:                                                DATE: 09/17/23 Pt seen for aquatic therapy today.  Treatment took place in water 3.5-4.75 ft in depth at the [facility] pool. Temp of water was 91.  Pt entered and exits the pool via stairs using step to pattern with hand rail, exited via lift for pain management purposes  *Intro to setting *walking forward, back and side stepping with ue support of noodle 4.7 ft *cycling with support yellow noodle *marching forward ue support noodle *standing ue support wall 4.35ft: df, pf; hip add/abd x 10; relaxed squats x 5 (right knee popped with discomfort ended exercise) *seated on lift: hip add/abd; knee flex/ext.   09/16/23 Contract relax for bil HS 5 holds- ball placed under opp knee for support - STM to tibialis anterior in supine (ball under knee)  -passive stretching of tib anterior, HS -LAQ 2x10ea     PATIENT EDUCATION:  Education details: aquatic therapy progression/ modification Person educated: Patient Education method: Explanation Education comprehension: verbalized understanding  HOME EXERCISE PROGRAM: TBA  ASSESSMENT:  CLINICAL IMPRESSION: Pt reported increase in pain in knees with marching, but other exercises tolerated well, especially LE stretches.  Plan to create and issue aquatic HEP; pt currently has 2-3 more water appts  prior to end of POC.  Will continue to work on improving knee extension and will progress as tolerated,   Eval: Patient is a 58 y.o. f who was seen today for physical therapy evaluation and treatment for bilat knee OA. She presents today with husband assisting she is using a rollator.  May require wc assistance for therapy sessions.  Exam reveals significant bilateral knee flex contractures, limited knee flex, strength deficits, balance and  proprioception deficits. Her gait is greatly impeded as she is unable to strike her heels and doesn't clear her feet from the floor/shuffles. She is a high fall risk due to posture and gait deficits.  She will benefit from skilled physical therapy both aquatic and land.  Will plan on focusing initially in aquatics on pain management, movement toleration improving ROM and strengthening then progress fairly quickly to alternating with land. Goals to improve all deficits which are significantly affecting her functional mobility and ADL's  OBJECTIVE IMPAIRMENTS: Abnormal gait, decreased activity tolerance, decreased balance, decreased coordination, decreased endurance, decreased knowledge of condition, decreased knowledge of use of DME, decreased mobility, difficulty walking, decreased ROM, decreased strength, impaired flexibility, postural dysfunction, obesity, and pain.   ACTIVITY LIMITATIONS: carrying, lifting, bending, sitting, standing, squatting, sleeping, stairs, transfers, hygiene/grooming, and locomotion level  PARTICIPATION LIMITATIONS: meal prep, cleaning, laundry, driving, shopping, community activity, occupation, and yard work  PERSONAL FACTORS: Behavior pattern, Fitness, Past/current experiences, and Time since onset of injury/illness/exacerbation are also affecting patient's functional outcome.   REHAB POTENTIAL: Good  CLINICAL DECISION MAKING: Evolving/moderate complexity  EVALUATION COMPLEXITY: Moderate   GOALS: Goals reviewed with patient? Yes  SHORT TERM GOALS: Target date: 10/11/23 Pt will tolerate full aquatic sessions consistently without increase in pain and with improving function to demonstrate good toleration and effectiveness of intervention.  Baseline: Goal status:Met 10/14/23  2.  Pt will tolerate walking to and from setting and engaging in aquatic therapy session without excessive fatigue or increase in pain to demonstrate improved toleration to activity. Baseline:  unable Goal status: In progress 10/14/23  3.  Pt will be indep with stair negotiation into and out of pool consistently using hand rail Baseline:  Goal status: Met 10/14/23    4. Pt to verbalize understanding of the importance of exercise/movement on overall well being.    Baseline: immobile for up towards 1 year    Goal status:Met 10/14/23   LONG TERM GOALS: Target date: 11/08/23  Pt to improve on LEFS by at least 9 point to demonstrate statistically significant Improvement in function. Baseline: 12/80 Goal status: INITIAL  2.  Pt will report decrease in pain by at least 50% for improved toleration to activity/quality of life and to demonstrate improved management of pain. Baseline:  Goal status: INITIAL  3.  Pt will improve strength in knee extension by at least 10 lbs to demonstrate improved overall physical function Baseline:  Goal status: INITIAL  4.  Pt to improve bilateral knee extension by 50% for improved gait. Baseline:  Goal status: INITIAL    5. Pt will be indep with final HEP's (land and aquatic as appropriate) for continued management of condition    Baseline:    Goal status: INITIAL   PLAN:  PT FREQUENCY: 2x/week  PT DURATION: 8 weeks  PLANNED INTERVENTIONS: [id number]- PT Re-evaluation, 97110-Therapeutic exercises, 97530- Therapeutic activity, 97112- Neuromuscular re-education, 97535- Self Care, [id number]- Manual therapy, [id number]- Gait training, [phone]- Orthotic Initial, [phone]- Aquatic Therapy, [phone]- Ionotophoresis 4mg /ml Dexamethasone , Patient/Family education, Balance training, Stair training, Taping, Dry  Needling, Joint mobilization, DME instructions, Cryotherapy, and Moist heat  PLAN FOR NEXT SESSION: alternate land/aquatics for Knee ROM/strengthening; gait; balance an proprioception; Review HEP  Delon Aquas, PTA 10/21/23 12:36 PM [facility] [address] [place], [place], [address] Phone: [phone]    Fax:  [phone]     Date of referral: 08/09/23 Referring provider: Maude Graves MD Referring diagnosis? Bilat knee OA Treatment diagnosis? (if different than referring diagnosis) no  What was this (referring dx) caused by? Arthritis  Lysle of Condition: Chronic (continuous duration > 3 months)   Laterality: Both  Current Functional Measure Score: LEFS 12/80  Objective measurements identify impairments when they are compared to normal values, the uninvolved extremity, and prior level of function.  [x]  Yes  []  No  Objective assessment of functional ability: Severe functional limitations   Briefly describe symptoms: Bilat knee pain, flexion contractures, gait abnormality, strength deficits  How did symptoms start: OA  Average pain intensity:  Last 24 hours: 3/10  Past week: 3/10  How often does the pt experience symptoms? Constantly  How much have the symptoms interfered with usual daily activities? Quite a bit  How has condition changed since care began at this facility? NA - initial visit  In general, how is the patients overall health? Good   BACK PAIN (STarT Back Screening Tool) No

## 2023-10-24 ENCOUNTER — Encounter (HOSPITAL_BASED_OUTPATIENT_CLINIC_OR_DEPARTMENT_OTHER): Payer: Self-pay | Admitting: Physical Therapy

## 2023-10-24 ENCOUNTER — Ambulatory Visit (HOSPITAL_BASED_OUTPATIENT_CLINIC_OR_DEPARTMENT_OTHER): Admitting: Physical Therapy

## 2023-10-24 DIAGNOSIS — R2689 Other abnormalities of gait and mobility: Secondary | ICD-10-CM

## 2023-10-24 DIAGNOSIS — M6281 Muscle weakness (generalized): Secondary | ICD-10-CM

## 2023-10-24 DIAGNOSIS — R293 Abnormal posture: Secondary | ICD-10-CM

## 2023-10-24 DIAGNOSIS — M25562 Pain in left knee: Secondary | ICD-10-CM | POA: Diagnosis not present

## 2023-10-24 DIAGNOSIS — G8929 Other chronic pain: Secondary | ICD-10-CM | POA: Diagnosis not present

## 2023-10-24 DIAGNOSIS — M25561 Pain in right knee: Secondary | ICD-10-CM | POA: Diagnosis not present

## 2023-10-24 NOTE — Therapy (Signed)
 OUTPATIENT PHYSICAL THERAPY LOWER EXTREMITY TREATMENT   Patient Name: [name] Wright MRN: [id number] DOB:[birth year], 58 y.o., female Today's Date: 10/24/2023  END OF SESSION:  PT End of Session - 10/24/23 1103     Visit Number 12    Date for PT Re-Evaluation 11/08/23    Authorization Type [employer] mcr approved 16 visits 6/3 - 7-29    Authorization - Visit Number 12    Authorization - Number of Visits 16    Progress Note Due on Visit 19    PT Start Time 1101    PT Stop Time 1140    PT Time Calculation (min) 39 min    Activity Tolerance Patient tolerated treatment well    Behavior During Therapy WFL for tasks assessed/performed            Past Medical History:  Diagnosis Date   Acute cystitis    Allergic rhinitis    Childhood asthma    no problems as adult - no inhaler   Chronic insomnia    Chronic pain due to trauma 02/2001   closed head trauma - Followed by Dr Elspeth Mems [facility]   Complication of anesthesia    Dyspepsia    Family history of ovarian cancer    Head trauma 02/24/2001   closed   History of kidney stones    passed stone - no surgery required   Hypertension    Hypothyroidism    PONV (postoperative nausea and vomiting)    Pre-diabetes    Sleep apnea    Pt denies   SVD (spontaneous vaginal delivery)    fetal demise at 5 months   Past Surgical History:  Procedure Laterality Date   APPENDECTOMY     BIOPSY  08/25/2020   Procedure: BIOPSY;  Surgeon: San Sandor GAILS, DO;  Location: [facility];  Service: Gastroenterology;;   COLONOSCOPY WITH PROPOFOL  N/A 08/25/2020   Procedure: COLONOSCOPY WITH PROPOFOL ;  Surgeon: San Sandor GAILS, DO;  Location: [facility];  Service: Gastroenterology;  Laterality: N/A;   DILATATION & CURETTAGE/HYSTEROSCOPY WITH MYOSURE N/A 04/15/2017   Procedure: DILATATION & CURETTAGE/HYSTEROSCOPY WITH MYOSURE POLYPECTOMY;  Surgeon: Curlene Agent, MD;  Location: [facility];  Service: Gynecology;  Laterality:  N/A;   DILATATION & CURETTAGE/HYSTEROSCOPY WITH MYOSURE N/A 06/18/2019   Procedure: DILATATION & CURETTAGE/HYSTEROSCOPY WITH  MYOSURE;  Surgeon: Curlene Agent, MD;  Location: [facility];  Service: Gynecology;  Laterality: N/A;   DILATATION & CURETTAGE/HYSTEROSCOPY WITH MYOSURE N/A 06/07/2023   Procedure: DILATATION & CURETTAGE/HYSTEROSCOPY WITH  MYOSURE;  Surgeon: Curlene Agent, MD;  Location: [facility];  Service: Gynecology;  Laterality: N/A;  POSSIBLE MYOSURE [facility]   ESOPHAGOGASTRODUODENOSCOPY (EGD) WITH PROPOFOL  N/A 08/25/2020   Procedure: ESOPHAGOGASTRODUODENOSCOPY (EGD) WITH PROPOFOL ;  Surgeon: San Sandor GAILS, DO;  Location: [facility];  Service: Gastroenterology;  Laterality: N/A;   EYE SURGERY     cataracts removed   INCONTINENCE SURGERY     PILONIDAL CYST EXCISION     x2   POLYPECTOMY  08/25/2020   Procedure: POLYPECTOMY;  Surgeon: San Sandor GAILS, DO;  Location: [facility];  Service: Gastroenterology;;   repair of toe laceration     dermabond to right big toe right foot   repair of torn ligament right leg     patient denies this surgery   right foot fracture     cast only   TONSILLECTOMY     WISDOM TOOTH EXTRACTION     Patient Active Problem List   Diagnosis Date Noted  Urgency of urination 09/26/2023   Vertigo 08/19/2023   Swimmer's ear 08/19/2023   Rash and nonspecific skin eruption 08/19/2023   Chronic pain of both knees 08/17/2023   Thrush, oral 07/07/2023   Female climacteric state 05/14/2023   Pre-op exam 05/14/2023   Class 3 severe obesity due to excess calories with body mass index (BMI) of 50.0 to 59.9 in adult 03/25/2023   Primary hypothyroidism 03/25/2023   Otalgia, right ear 03/25/2023   Vitamin D  deficiency 02/06/2023   Vitamin B12 deficiency 02/06/2023   Urinary frequency 05/27/2022   Leukocytes in urine 05/27/2022   Costochondritis 05/14/2022   Genetic testing 03/08/2021   Family history of ovarian cancer 02/16/2021   Uncontrolled type 2 diabetes  mellitus with hyperglycemia (HCC) 02/07/2021   Essential hypertension, benign 02/07/2021   Gastritis and gastroduodenitis    Nausea without vomiting    Adenomatous polyp of ascending colon    Adenomatous polyp of transverse colon    Adenomatous polyp of sigmoid colon    Rectal polyp    Adenomatous polyp of descending colon    Gall bladder pain 11/04/2019   Class 3 severe obesity due to excess calories with serious comorbidity and body mass index (BMI) of 60.0 to 69.9 in adult 08/13/2019   Nasal congestion 03/10/2019   Sinus pressure 03/10/2019   Hyperglycemia 01/22/2019   Hypertriglyceridemia 01/22/2019   Combined forms of age-related cataract of left eye 09/10/2017   Musculoskeletal neck pain 07/24/2017   Migraine without aura and without status migrainosus, not intractable 11/12/2016   Encounter for preventative adult health care exam with abnormal findings 09/01/2015   Sinusitis, chronic 09/01/2015   Cough 09/01/2015   Morbid obesity with BMI of 50.0-59.9, adult (HCC) 11/18/2014   RUQ pain 10/27/2014   Urinary incontinence 09/17/2013   Acute sinus infection 10/16/2012   Paresthesias 09/22/2012   Sleep apnea, obstructive 09/22/2012   Elevated BP 07/29/2012   Fatigue 07/10/2012   Anxiety 06/02/2012   Closed head injury 06/02/2012   Depression 06/02/2012   Facial pain 06/02/2012   Leg cramps 04/15/2011   Edema 12/21/2010   Chronic pain due to trauma 03/15/2007   Extrinsic asthma 03/15/2007   INSOMNIA, CHRONIC 11/01/2006   Allergic rhinitis 11/01/2006   DYSPEPSIA, CHRONIC 11/01/2006   Head injury 11/01/2006    PCP: Catheryn Slocumb MD  REFERRING PROVIDER:   Sheril Coy, MD    REFERRING DIAG: M17.0 (ICD-10-CM) - Degenerative arthritis of knee, bilateral   THERAPY DIAG:  Bilateral chronic knee pain  Muscle weakness (generalized)  Abnormal posture  Other abnormalities of gait and mobility  Rationale for Evaluation and Treatment: Rehabilitation  ONSET DATE:  1 year  SUBJECTIVE:   SUBJECTIVE STATEMENT:  Knee pain is up a little bit and a little more stiff, slept poorly.  I have been using the massager and it is helping a lot.  POOL ACCESS: currently none.  Has [employer] that will pay for membership to aquatic center.  Has done aquatic therapy in the past.   eval: Want to avoid having TKR.  Bone spurs are on the back of my knee caps.  Twisted my knee about 1 years ago.  Have been using rollator on and off for past 6 months. Can't straighten my knee. Gel shots have helped.  Just had injections last week.  Have a few more scheduled.  Had a surgery in last few months and I have been somewhat immobile not moving much at all.. Sleep for about 4 hours before waking  PERTINENT HISTORY: TBI 2008/2014 PAIN:  Are you having pain? Yes: NPRS scale: current 5/10 Lt knee 6/10 Rt knee Pain location: bilat knee joint Pain description: ache Aggravating factors: weight bearing Relieving factors: lidocaine  patches, OTC meds, mag cream  PRECAUTIONS: None  RED FLAGS: None   WEIGHT BEARING RESTRICTIONS: No  FALLS:  Has patient fallen in last 6 months? No tripped walking into the house  LIVING ENVIRONMENT: Lives with: lives with their family Lives in: House/apartment Stairs: 6 step into home Has following equipment at home: Single point cane and rollator and electric scooterlift chair, shower bench  OCCUPATION: disables  PLOF: Independent uses shower benches  PATIENT GOALS: build stamina, increase strength  NEXT MD VISIT: Friday  OBJECTIVE:  Note: Objective measures were completed at Evaluation unless otherwise noted.  DIAGNOSTIC FINDINGS: none in chart  PATIENT SURVEYS:  LEFS 12/80  COGNITION: Overall cognitive status: Within functional limits for tasks assessed     SENSATION: WFL   MUSCLE LENGTH: Hamstrings:shortened bilaterally   POSTURE: rounded shoulders, forward head, and knee flexed trunk forward  flexed.  PALPATION: Moderate TTP about knees bilat joint lines medial> Lateral  LOWER EXTREMITY ROM:  Active ROM Right eval Left eval  Hip flexion    Hip extension    Hip abduction    Hip adduction    Hip internal rotation    Hip external rotation    Knee flexion 100d 103  Knee extension -40 -34  Ankle dorsiflexion    Ankle plantarflexion    Ankle inversion    Ankle eversion     (Blank rows = not tested)  LOWER EXTREMITY MMT:  HD (lb) Tested in sitting Right eval Left eval  Hip flexion 44.4 45.9  Hip extension    Hip abduction 27.1 25.9  Hip adduction    Hip internal rotation    Hip external rotation    Knee flexion    Knee extension 33.1 25.6  Ankle dorsiflexion    Ankle plantarflexion    Ankle inversion    Ankle eversion     (Blank rows = not tested)   FUNCTIONAL TESTS:  Timed up and go (TUG): 23.91 STS transfers: heavy ue support from pool bench; forward momentum with ue elevated   GAIT: Distance walked: 100 ft Assistive device utilized: rollator Level of assistance: SBA Comments: bilateral flex contractures: no heel strike, sliding on toes when advancing LE during swing, forward flex trunk posture                                                                                                                                TREATMENT  [facility] Adult PT Treatment:                                                DATE: 10/24/23 Pt  seen for aquatic therapy today.  Treatment took place in water 3.5-4.75 ft in depth at the [facility] pool. Temp of water was 91.  Pt entered and exited the pool via stairs using step-to pattern with heavy UE on hand rail.  * Yellow hand floats under water:  walking forward/backward - multiple laps, cues for heel/toe and toe/ heel *side stepping yellow hand floats and add/abdct * forward walking kicks  *3 way stretch using hollow full noodle. (Pt reports greater stretch in stance leg her focus on keeping heel planted) *  UE on corner in deeper water (no floatation support):hip add/abd; hip flex/extension; cycling; clams *Tandem stance lle forward yellow HB support 3.6 ft x 20 s unsteady but maintains balance->unable to tolerate rle forward due to pain.  *SLS R/L x 20s ea ue support yellow HB *seated 3rd step: alternation LAQ *hs stretch on 2nd step. *Gastroc stretch bottom, heels off    10/17/23 STM to distal quad and lateral HS end of session x29min SAQ 3 second hold 1# x10, 0# x10 Supine SLR 2x10ea Quad set pressing into ball under knee 5 2x10ea Seated HSS with heel on 6 Step 2x30sec each Gait in hall with rollator 90ft, seated rest break, 77ft Standing calf stretch (back of nu-step) 30sec x2ea          PATIENT EDUCATION:  Education details: aquatic therapy progression/ modification Person educated: Patient Education method: Explanation Education comprehension: verbalized understanding  HOME EXERCISE PROGRAM: TBA  ASSESSMENT:  CLINICAL IMPRESSION: Visually pt has improved knee extension, she is now able to plant heels on ground with effort in standing.  She does reports sight increase in knee and hip pain post sessions due to effort and aggressive stretching.  Will continue to challenge balance as she is unsteady with reduced BOS. She is progressing well with good effort and attitude.  Eval: Patient is a 58 y.o. f who was seen today for physical therapy evaluation and treatment for bilat knee OA. She presents today with husband assisting she is using a rollator.  May require wc assistance for therapy sessions.  Exam reveals significant bilateral knee flex contractures, limited knee flex, strength deficits, balance and proprioception deficits. Her gait is greatly impeded as she is unable to strike her heels and doesn't clear her feet from the floor/shuffles. She is a high fall risk due to posture and gait deficits.  She will benefit from skilled physical therapy both aquatic and land.  Will  plan on focusing initially in aquatics on pain management, movement toleration improving ROM and strengthening then progress fairly quickly to alternating with land. Goals to improve all deficits which are significantly affecting her functional mobility and ADL's  OBJECTIVE IMPAIRMENTS: Abnormal gait, decreased activity tolerance, decreased balance, decreased coordination, decreased endurance, decreased knowledge of condition, decreased knowledge of use of DME, decreased mobility, difficulty walking, decreased ROM, decreased strength, impaired flexibility, postural dysfunction, obesity, and pain.   ACTIVITY LIMITATIONS: carrying, lifting, bending, sitting, standing, squatting, sleeping, stairs, transfers, hygiene/grooming, and locomotion level  PARTICIPATION LIMITATIONS: meal prep, cleaning, laundry, driving, shopping, community activity, occupation, and yard work  PERSONAL FACTORS: Behavior pattern, Fitness, Past/current experiences, and Time since onset of injury/illness/exacerbation are also affecting patient's functional outcome.   REHAB POTENTIAL: Good  CLINICAL DECISION MAKING: Evolving/moderate complexity  EVALUATION COMPLEXITY: Moderate   GOALS: Goals reviewed with patient? Yes  SHORT TERM GOALS: Target date: 10/11/23 Pt will tolerate full aquatic sessions consistently without increase in pain and with improving function to demonstrate good  toleration and effectiveness of intervention.  Baseline: Goal status:Met 10/14/23  2.  Pt will tolerate walking to and from setting and engaging in aquatic therapy session without excessive fatigue or increase in pain to demonstrate improved toleration to activity. Baseline: unable Goal status: In progress 10/14/23  3.  Pt will be indep with stair negotiation into and out of pool consistently using hand rail Baseline:  Goal status: Met 10/14/23    4. Pt to verbalize understanding of the importance of exercise/movement on overall well  being.    Baseline: immobile for up towards 1 year    Goal status:Met 10/14/23   LONG TERM GOALS: Target date: 11/08/23  Pt to improve on LEFS by at least 9 point to demonstrate statistically significant Improvement in function. Baseline: 12/80 Goal status: INITIAL  2.  Pt will report decrease in pain by at least 50% for improved toleration to activity/quality of life and to demonstrate improved management of pain. Baseline:  Goal status: INITIAL  3.  Pt will improve strength in knee extension by at least 10 lbs to demonstrate improved overall physical function Baseline:  Goal status: INITIAL  4.  Pt to improve bilateral knee extension by 50% for improved gait. Baseline:  Goal status: INITIAL    5. Pt will be indep with final HEP's (land and aquatic as appropriate) for continued management of condition    Baseline:    Goal status: INITIAL   PLAN:  PT FREQUENCY: 2x/week  PT DURATION: 8 weeks  PLANNED INTERVENTIONS: 97164- PT Re-evaluation, 97110-Therapeutic exercises, 97530- Therapeutic activity, [id number]- Neuromuscular re-education, 97535- Self Care, [id number]- Manual therapy, [id number]- Gait training, [phone]- Orthotic Initial, [phone]- Aquatic Therapy, [phone]- Ionotophoresis 4mg /ml Dexamethasone , Patient/Family education, Balance training, Stair training, Taping, Dry Needling, Joint mobilization, DME instructions, Cryotherapy, and Moist heat  PLAN FOR NEXT SESSION: alternate land/aquatics for Knee ROM/strengthening; gait; balance an proprioception; Review HEP  Ronal Foots) Leinani Lisbon MPT 10/24/23 11:48 AM [facility] [address] [place], [place], [address] Phone: [phone]   Fax:  [phone]      Date of referral: 08/09/23 Referring provider: Maude Graves MD Referring diagnosis? Bilat knee OA Treatment diagnosis? (if different than referring diagnosis) no  What was this (referring dx) caused by? Arthritis  Lysle of  Condition: Chronic (continuous duration > 3 months)   Laterality: Both  Current Functional Measure Score: LEFS 12/80  Objective measurements identify impairments when they are compared to normal values, the uninvolved extremity, and prior level of function.  [x]  Yes  []  No  Objective assessment of functional ability: Severe functional limitations   Briefly describe symptoms: Bilat knee pain, flexion contractures, gait abnormality, strength deficits  How did symptoms start: OA  Average pain intensity:  Last 24 hours: 3/10  Past week: 3/10  How often does the pt experience symptoms? Constantly  How much have the symptoms interfered with usual daily activities? Quite a bit  How has condition changed since care began at this facility? NA - initial visit  In general, how is the patients overall health? Good   BACK PAIN (STarT Back Screening Tool) No

## 2023-10-26 ENCOUNTER — Other Ambulatory Visit: Payer: Self-pay | Admitting: Internal Medicine

## 2023-10-28 ENCOUNTER — Ambulatory Visit (HOSPITAL_BASED_OUTPATIENT_CLINIC_OR_DEPARTMENT_OTHER): Admitting: Physical Therapy

## 2023-10-28 ENCOUNTER — Encounter (HOSPITAL_BASED_OUTPATIENT_CLINIC_OR_DEPARTMENT_OTHER): Payer: Self-pay | Admitting: Physical Therapy

## 2023-10-28 DIAGNOSIS — M25562 Pain in left knee: Secondary | ICD-10-CM | POA: Diagnosis not present

## 2023-10-28 DIAGNOSIS — G8929 Other chronic pain: Secondary | ICD-10-CM | POA: Diagnosis not present

## 2023-10-28 DIAGNOSIS — R293 Abnormal posture: Secondary | ICD-10-CM | POA: Diagnosis not present

## 2023-10-28 DIAGNOSIS — R2689 Other abnormalities of gait and mobility: Secondary | ICD-10-CM | POA: Diagnosis not present

## 2023-10-28 DIAGNOSIS — M6281 Muscle weakness (generalized): Secondary | ICD-10-CM | POA: Diagnosis not present

## 2023-10-28 DIAGNOSIS — M25561 Pain in right knee: Secondary | ICD-10-CM | POA: Diagnosis not present

## 2023-10-28 NOTE — Therapy (Signed)
 OUTPATIENT PHYSICAL THERAPY LOWER EXTREMITY TREATMENT   Patient Name: [name] Wright MRN: [id number] DOB:Mar 04, 1966, 58 y.o., female Today's Date: 10/28/2023  END OF SESSION:  PT End of Session - 10/28/23 0932     Visit Number 13    Date for PT Re-Evaluation 11/08/23    Authorization Type [employer] mcr approved 16 visits 6/3 - 7-29    Authorization - Visit Number 13    Authorization - Number of Visits 16    Progress Note Due on Visit 19    PT Start Time 0928    PT Stop Time 1010    PT Time Calculation (min) 42 min    Activity Tolerance Patient tolerated treatment well    Behavior During Therapy [facility] for tasks assessed/performed            Past Medical History:  Diagnosis Date   Acute cystitis    Allergic rhinitis    Childhood asthma    no problems as adult - no inhaler   Chronic insomnia    Chronic pain due to trauma 02/2001   closed head trauma - Followed by Dr Elspeth Mems [facility]   Complication of anesthesia    Dyspepsia    Family history of ovarian cancer    Head trauma 02/24/2001   closed   History of kidney stones    passed stone - no surgery required   Hypertension    Hypothyroidism    PONV (postoperative nausea and vomiting)    Pre-diabetes    Sleep apnea    Pt denies   SVD (spontaneous vaginal delivery)    fetal demise at 5 months   Past Surgical History:  Procedure Laterality Date   APPENDECTOMY     BIOPSY  08/25/2020   Procedure: BIOPSY;  Surgeon: San Sandor GAILS, DO;  Location: [facility];  Service: Gastroenterology;;   COLONOSCOPY WITH PROPOFOL  N/A 08/25/2020   Procedure: COLONOSCOPY WITH PROPOFOL ;  Surgeon: San Sandor GAILS, DO;  Location: [facility];  Service: Gastroenterology;  Laterality: N/A;   DILATATION & CURETTAGE/HYSTEROSCOPY WITH MYOSURE N/A 04/15/2017   Procedure: DILATATION & CURETTAGE/HYSTEROSCOPY WITH MYOSURE POLYPECTOMY;  Surgeon: Curlene Agent, MD;  Location: [facility];  Service: Gynecology;  Laterality:  N/A;   DILATATION & CURETTAGE/HYSTEROSCOPY WITH MYOSURE N/A 06/18/2019   Procedure: DILATATION & CURETTAGE/HYSTEROSCOPY WITH  MYOSURE;  Surgeon: Curlene Agent, MD;  Location: [facility];  Service: Gynecology;  Laterality: N/A;   DILATATION & CURETTAGE/HYSTEROSCOPY WITH MYOSURE N/A 06/07/2023   Procedure: DILATATION & CURETTAGE/HYSTEROSCOPY WITH  MYOSURE;  Surgeon: Curlene Agent, MD;  Location: [facility] OR;  Service: Gynecology;  Laterality: N/A;  POSSIBLE MYOSURE [facility]   ESOPHAGOGASTRODUODENOSCOPY (EGD) WITH PROPOFOL  N/A 08/25/2020   Procedure: ESOPHAGOGASTRODUODENOSCOPY (EGD) WITH PROPOFOL ;  Surgeon: San Sandor GAILS, DO;  Location: [facility];  Service: Gastroenterology;  Laterality: N/A;   EYE SURGERY     cataracts removed   INCONTINENCE SURGERY     PILONIDAL CYST EXCISION     x2   POLYPECTOMY  08/25/2020   Procedure: POLYPECTOMY;  Surgeon: San Sandor GAILS, DO;  Location: [facility];  Service: Gastroenterology;;   repair of toe laceration     dermabond to right big toe right foot   repair of torn ligament right leg     patient denies this surgery   right foot fracture     cast only   TONSILLECTOMY     WISDOM TOOTH EXTRACTION     Patient Active Problem List   Diagnosis Date Noted  Urgency of urination 09/26/2023   Vertigo 08/19/2023   Swimmer's ear 08/19/2023   Rash and nonspecific skin eruption 08/19/2023   Chronic pain of both knees 08/17/2023   Thrush, oral 07/07/2023   Female climacteric state 05/14/2023   Pre-op exam 05/14/2023   Class 3 severe obesity due to excess calories with body mass index (BMI) of 50.0 to 59.9 in adult 03/25/2023   Primary hypothyroidism 03/25/2023   Otalgia, right ear 03/25/2023   Vitamin D  deficiency 02/06/2023   Vitamin B12 deficiency 02/06/2023   Urinary frequency 05/27/2022   Leukocytes in urine 05/27/2022   Costochondritis 05/14/2022   Genetic testing 03/08/2021   Family history of ovarian cancer 02/16/2021   Uncontrolled type 2 diabetes  mellitus with hyperglycemia (HCC) 02/07/2021   Essential hypertension, benign 02/07/2021   Gastritis and gastroduodenitis    Nausea without vomiting    Adenomatous polyp of ascending colon    Adenomatous polyp of transverse colon    Adenomatous polyp of sigmoid colon    Rectal polyp    Adenomatous polyp of descending colon    Gall bladder pain 11/04/2019   Class 3 severe obesity due to excess calories with serious comorbidity and body mass index (BMI) of 60.0 to 69.9 in adult 08/13/2019   Nasal congestion 03/10/2019   Sinus pressure 03/10/2019   Hyperglycemia 01/22/2019   Hypertriglyceridemia 01/22/2019   Combined forms of age-related cataract of left eye 09/10/2017   Musculoskeletal neck pain 07/24/2017   Migraine without aura and without status migrainosus, not intractable 11/12/2016   Encounter for preventative adult health care exam with abnormal findings 09/01/2015   Sinusitis, chronic 09/01/2015   Cough 09/01/2015   Morbid obesity with BMI of 50.0-59.9, adult (HCC) 11/18/2014   RUQ pain 10/27/2014   Urinary incontinence 09/17/2013   Acute sinus infection 10/16/2012   Paresthesias 09/22/2012   Sleep apnea, obstructive 09/22/2012   Elevated BP 07/29/2012   Fatigue 07/10/2012   Anxiety 06/02/2012   Closed head injury 06/02/2012   Depression 06/02/2012   Facial pain 06/02/2012   Leg cramps 04/15/2011   Edema 12/21/2010   Chronic pain due to trauma 03/15/2007   Extrinsic asthma 03/15/2007   INSOMNIA, CHRONIC 11/01/2006   Allergic rhinitis 11/01/2006   DYSPEPSIA, CHRONIC 11/01/2006   Head injury 11/01/2006    PCP: Catheryn Slocumb MD  REFERRING PROVIDER:   Sheril Coy, MD    REFERRING DIAG: M17.0 (ICD-10-CM) - Degenerative arthritis of knee, bilateral   THERAPY DIAG:  Bilateral chronic knee pain  Muscle weakness (generalized)  Abnormal posture  Rationale for Evaluation and Treatment: Rehabilitation  ONSET DATE: 1 year  SUBJECTIVE:   SUBJECTIVE  STATEMENT:  Pt reports pain/catch in right SI area with sideways movement after last session. Reports quick pain that immediately resolves. She also reports standing straight for ~ 50% distance to setting    POOL ACCESS: currently none.  Has [employer] that will pay for membership to aquatic center.  Has done aquatic therapy in the past.   eval: Want to avoid having TKR.  Bone spurs are on the back of my knee caps.  Twisted my knee about 1 years ago.  Have been using rollator on and off for past 6 months. Can't straighten my knee. Gel shots have helped.  Just had injections last week.  Have a few more scheduled.  Had a surgery in last few months and I have been somewhat immobile not moving much at all.. Sleep for about 4 hours before waking  PERTINENT  HISTORY: TBI 2008/2014 PAIN:  Are you having pain? Yes: NPRS scale: current 5/10 Lt knee 6/10 Rt knee Pain location: bilat knee joint Pain description: ache Aggravating factors: weight bearing Relieving factors: lidocaine  patches, OTC meds, mag cream  PRECAUTIONS: None  RED FLAGS: None   WEIGHT BEARING RESTRICTIONS: No  FALLS:  Has patient fallen in last 6 months? No tripped walking into the house  LIVING ENVIRONMENT: Lives with: lives with their family Lives in: House/apartment Stairs: 6 step into home Has following equipment at home: Single point cane and rollator and electric scooterlift chair, shower bench  OCCUPATION: disables  PLOF: Independent uses shower benches  PATIENT GOALS: build stamina, increase strength  NEXT MD VISIT: Friday  OBJECTIVE:  Note: Objective measures were completed at Evaluation unless otherwise noted.  DIAGNOSTIC FINDINGS: none in chart  PATIENT SURVEYS:  LEFS 12/80  COGNITION: Overall cognitive status: Within functional limits for tasks assessed     SENSATION: WFL   MUSCLE LENGTH: Hamstrings:shortened bilaterally   POSTURE: rounded shoulders, forward head, and knee  flexed trunk forward flexed.  PALPATION: Moderate TTP about knees bilat joint lines medial> Lateral  LOWER EXTREMITY ROM:  Active ROM Right eval Left eval  Hip flexion    Hip extension    Hip abduction    Hip adduction    Hip internal rotation    Hip external rotation    Knee flexion 100d 103  Knee extension -40 -34  Ankle dorsiflexion    Ankle plantarflexion    Ankle inversion    Ankle eversion     (Blank rows = not tested)  LOWER EXTREMITY MMT:  HD (lb) Tested in sitting Right eval Left eval  Hip flexion 44.4 45.9  Hip extension    Hip abduction 27.1 25.9  Hip adduction    Hip internal rotation    Hip external rotation    Knee flexion    Knee extension 33.1 25.6  Ankle dorsiflexion    Ankle plantarflexion    Ankle inversion    Ankle eversion     (Blank rows = not tested)   FUNCTIONAL TESTS:  Timed up and go (TUG): 23.91 STS transfers: heavy ue support from pool bench; forward momentum with ue elevated   GAIT: Distance walked: 100 ft Assistive device utilized: rollator Level of assistance: SBA Comments: bilateral flex contractures: no heel strike, sliding on toes when advancing LE during swing, forward flex trunk posture                                                                                                                                TREATMENT  [facility] Adult PT Treatment:                                                DATE: 10/28/23 Pt seen  for aquatic therapy today.  Treatment took place in water 3.5-4.75 ft in depth at the [facility] pool. Temp of water was 91.  Pt entered and exited the pool via stairs using step-to pattern with heavy UE on hand rail.  * Yellow hand floats under water:  walking forward/backward - multiple laps, cues for heel/toe and toe/ heel *side stepping yellow hand floats and add/abdct->slight knee bend *figure four stretch complete lle, rle unable to tolerate due to left knee pain *quad; hamstring and gastroc  at step Seated on lift: LAQ; hip add/abd;  3 sets 10 slow/10 fast; cycling * forward walking kicks  * UE on corner in deeper water (no floatation support):hip add/abd; hip flex/extension; cycling; clams     10/17/23 STM to distal quad and lateral HS end of session x35min SAQ 3 second hold 1# x10, 0# x10 Supine SLR 2x10ea Quad set pressing into ball under knee 5 2x10ea Seated HSS with heel on 6 Step 2x30sec each Gait in hall with rollator 77ft, seated rest break, 5ft Standing calf stretch (back of nu-step) 30sec x2ea          PATIENT EDUCATION:  Education details: aquatic therapy progression/ modification Person educated: Patient Education method: Explanation Education comprehension: verbalized understanding  HOME EXERCISE PROGRAM: TBA  ASSESSMENT:  CLINICAL IMPRESSION: Improving posture with amb tolerance as evidenced by walking 50% of way to setting in upright position (hands on hand grips rather than leaning forward on forearms. Improvements also in dynamic balance as no LOB or unsteadiness with balance challenges today. Pt c/o right post hip/glute area discomfort after last session.  Attempted Figure 4 and other glute stretches which is not well tolerated due increase in knee pain with positioning. Unable to recreate pain submerged today which she reports would catch with right hip abd.  Goals ongoing.   Eval: Patient is a 58 y.o. f who was seen today for physical therapy evaluation and treatment for bilat knee OA. She presents today with husband assisting she is using a rollator.  May require wc assistance for therapy sessions.  Exam reveals significant bilateral knee flex contractures, limited knee flex, strength deficits, balance and proprioception deficits. Her gait is greatly impeded as she is unable to strike her heels and doesn't clear her feet from the floor/shuffles. She is a high fall risk due to posture and gait deficits.  She will benefit from skilled physical  therapy both aquatic and land.  Will plan on focusing initially in aquatics on pain management, movement toleration improving ROM and strengthening then progress fairly quickly to alternating with land. Goals to improve all deficits which are significantly affecting her functional mobility and ADL's  OBJECTIVE IMPAIRMENTS: Abnormal gait, decreased activity tolerance, decreased balance, decreased coordination, decreased endurance, decreased knowledge of condition, decreased knowledge of use of DME, decreased mobility, difficulty walking, decreased ROM, decreased strength, impaired flexibility, postural dysfunction, obesity, and pain.   ACTIVITY LIMITATIONS: carrying, lifting, bending, sitting, standing, squatting, sleeping, stairs, transfers, hygiene/grooming, and locomotion level  PARTICIPATION LIMITATIONS: meal prep, cleaning, laundry, driving, shopping, community activity, occupation, and yard work  PERSONAL FACTORS: Behavior pattern, Fitness, Past/current experiences, and Time since onset of injury/illness/exacerbation are also affecting patient's functional outcome.   REHAB POTENTIAL: Good  CLINICAL DECISION MAKING: Evolving/moderate complexity  EVALUATION COMPLEXITY: Moderate   GOALS: Goals reviewed with patient? Yes  SHORT TERM GOALS: Target date: 10/11/23 Pt will tolerate full aquatic sessions consistently without increase in pain and with improving function to demonstrate good toleration and effectiveness  of intervention.  Baseline: Goal status:Met 10/14/23  2.  Pt will tolerate walking to and from setting and engaging in aquatic therapy session without excessive fatigue or increase in pain to demonstrate improved toleration to activity. Baseline: unable Goal status: In progress 10/14/23  3.  Pt will be indep with stair negotiation into and out of pool consistently using hand rail Baseline:  Goal status: Met 10/14/23    4. Pt to verbalize understanding of the importance of  exercise/movement on overall well being.    Baseline: immobile for up towards 1 year    Goal status:Met 10/14/23   LONG TERM GOALS: Target date: 11/08/23  Pt to improve on LEFS by at least 9 point to demonstrate statistically significant Improvement in function. Baseline: 12/80 Goal status: INITIAL  2.  Pt will report decrease in pain by at least 50% for improved toleration to activity/quality of life and to demonstrate improved management of pain. Baseline:  Goal status: INITIAL  3.  Pt will improve strength in knee extension by at least 10 lbs to demonstrate improved overall physical function Baseline:  Goal status: INITIAL  4.  Pt to improve bilateral knee extension by 50% for improved gait. Baseline:  Goal status: INITIAL    5. Pt will be indep with final HEP's (land and aquatic as appropriate) for continued management of condition    Baseline:    Goal status: INITIAL   PLAN:  PT FREQUENCY: 2x/week  PT DURATION: 8 weeks  PLANNED INTERVENTIONS: 97164- PT Re-evaluation, 97110-Therapeutic exercises, 97530- Therapeutic activity, [id number]- Neuromuscular re-education, 97535- Self Care, [id number]- Manual therapy, [id number]- Gait training, [phone]- Orthotic Initial, [phone]- Aquatic Therapy, [phone]- Ionotophoresis 4mg /ml Dexamethasone , Patient/Family education, Balance training, Stair training, Taping, Dry Needling, Joint mobilization, DME instructions, Cryotherapy, and Moist heat  PLAN FOR NEXT SESSION: alternate land/aquatics for Knee ROM/strengthening; gait; balance an proprioception; Review HEP  Ronal Foots) Jaiya Mooradian MPT 10/28/23 9:34 AM [facility] [address] [place], [place], [address] Phone: [phone]   Fax:  [phone]      Date of referral: 08/09/23 Referring provider: Maude Graves MD Referring diagnosis? Bilat knee OA Treatment diagnosis? (if different than referring diagnosis) no  What was this (referring dx) caused  by? Arthritis  Lysle of Condition: Chronic (continuous duration > 3 months)   Laterality: Both  Current Functional Measure Score: LEFS 12/80  Objective measurements identify impairments when they are compared to normal values, the uninvolved extremity, and prior level of function.  [x]  Yes  []  No  Objective assessment of functional ability: Severe functional limitations   Briefly describe symptoms: Bilat knee pain, flexion contractures, gait abnormality, strength deficits  How did symptoms start: OA  Average pain intensity:  Last 24 hours: 3/10  Past week: 3/10  How often does the pt experience symptoms? Constantly  How much have the symptoms interfered with usual daily activities? Quite a bit  How has condition changed since care began at this facility? NA - initial visit  In general, how is the patients overall health? Good   BACK PAIN (STarT Back Screening Tool) No

## 2023-10-31 ENCOUNTER — Ambulatory Visit (HOSPITAL_BASED_OUTPATIENT_CLINIC_OR_DEPARTMENT_OTHER): Payer: Self-pay | Admitting: Physical Therapy

## 2023-10-31 ENCOUNTER — Encounter (HOSPITAL_BASED_OUTPATIENT_CLINIC_OR_DEPARTMENT_OTHER): Admitting: Rehabilitative and Restorative Service Providers"

## 2023-10-31 ENCOUNTER — Encounter (HOSPITAL_BASED_OUTPATIENT_CLINIC_OR_DEPARTMENT_OTHER): Payer: Self-pay | Admitting: Physical Therapy

## 2023-10-31 DIAGNOSIS — R2689 Other abnormalities of gait and mobility: Secondary | ICD-10-CM

## 2023-10-31 DIAGNOSIS — M25562 Pain in left knee: Secondary | ICD-10-CM | POA: Diagnosis not present

## 2023-10-31 DIAGNOSIS — G8929 Other chronic pain: Secondary | ICD-10-CM

## 2023-10-31 DIAGNOSIS — R293 Abnormal posture: Secondary | ICD-10-CM | POA: Diagnosis not present

## 2023-10-31 DIAGNOSIS — M25561 Pain in right knee: Secondary | ICD-10-CM | POA: Diagnosis not present

## 2023-10-31 DIAGNOSIS — M6281 Muscle weakness (generalized): Secondary | ICD-10-CM | POA: Diagnosis not present

## 2023-10-31 NOTE — Therapy (Signed)
 OUTPATIENT PHYSICAL THERAPY LOWER EXTREMITY TREATMENT   Patient Name: [name] Wright MRN: [id number] DOB:[birth year], 58 y.o., female Today's Date: 10/31/2023  END OF SESSION:  PT End of Session - 10/31/23 1500     Visit Number 14    Date for PT Re-Evaluation 11/08/23    Authorization Type [employer] mcr approved 16 visits 6/3 - 7-29    Authorization - Visit Number 14    Authorization - Number of Visits 16    Progress Note Due on Visit 19    PT Start Time 1445    PT Stop Time 1523    PT Time Calculation (min) 38 min    Activity Tolerance Patient tolerated treatment well    Behavior During Therapy WFL for tasks assessed/performed            Past Medical History:  Diagnosis Date   Acute cystitis    Allergic rhinitis    Childhood asthma    no problems as adult - no inhaler   Chronic insomnia    Chronic pain due to trauma 02/2001   closed head trauma - Followed by Dr Elspeth Mems [facility]   Complication of anesthesia    Dyspepsia    Family history of ovarian cancer    Head trauma 02/24/2001   closed   History of kidney stones    passed stone - no surgery required   Hypertension    Hypothyroidism    PONV (postoperative nausea and vomiting)    Pre-diabetes    Sleep apnea    Pt denies   SVD (spontaneous vaginal delivery)    fetal demise at 5 months   Past Surgical History:  Procedure Laterality Date   APPENDECTOMY     BIOPSY  08/25/2020   Procedure: BIOPSY;  Surgeon: San Sandor GAILS, DO;  Location: [facility];  Service: Gastroenterology;;   COLONOSCOPY WITH PROPOFOL  N/A 08/25/2020   Procedure: COLONOSCOPY WITH PROPOFOL ;  Surgeon: San Sandor GAILS, DO;  Location: [facility];  Service: Gastroenterology;  Laterality: N/A;   DILATATION & CURETTAGE/HYSTEROSCOPY WITH MYOSURE N/A 04/15/2017   Procedure: DILATATION & CURETTAGE/HYSTEROSCOPY WITH MYOSURE POLYPECTOMY;  Surgeon: Curlene Agent, MD;  Location: [facility];  Service: Gynecology;  Laterality:  N/A;   DILATATION & CURETTAGE/HYSTEROSCOPY WITH MYOSURE N/A 06/18/2019   Procedure: DILATATION & CURETTAGE/HYSTEROSCOPY WITH  MYOSURE;  Surgeon: Curlene Agent, MD;  Location: [facility];  Service: Gynecology;  Laterality: N/A;   DILATATION & CURETTAGE/HYSTEROSCOPY WITH MYOSURE N/A 06/07/2023   Procedure: DILATATION & CURETTAGE/HYSTEROSCOPY WITH  MYOSURE;  Surgeon: Curlene Agent, MD;  Location: [facility];  Service: Gynecology;  Laterality: N/A;  POSSIBLE MYOSURE [facility]   ESOPHAGOGASTRODUODENOSCOPY (EGD) WITH PROPOFOL  N/A 08/25/2020   Procedure: ESOPHAGOGASTRODUODENOSCOPY (EGD) WITH PROPOFOL ;  Surgeon: San Sandor GAILS, DO;  Location: [facility];  Service: Gastroenterology;  Laterality: N/A;   EYE SURGERY     cataracts removed   INCONTINENCE SURGERY     PILONIDAL CYST EXCISION     x2   POLYPECTOMY  08/25/2020   Procedure: POLYPECTOMY;  Surgeon: San Sandor GAILS, DO;  Location: [facility];  Service: Gastroenterology;;   repair of toe laceration     dermabond to right big toe right foot   repair of torn ligament right leg     patient denies this surgery   right foot fracture     cast only   TONSILLECTOMY     WISDOM TOOTH EXTRACTION     Patient Active Problem List   Diagnosis Date Noted  Urgency of urination 09/26/2023   Vertigo 08/19/2023   Swimmer's ear 08/19/2023   Rash and nonspecific skin eruption 08/19/2023   Chronic pain of both knees 08/17/2023   Thrush, oral 07/07/2023   Female climacteric state 05/14/2023   Pre-op exam 05/14/2023   Class 3 severe obesity due to excess calories with body mass index (BMI) of 50.0 to 59.9 in adult 03/25/2023   Primary hypothyroidism 03/25/2023   Otalgia, right ear 03/25/2023   Vitamin D  deficiency 02/06/2023   Vitamin B12 deficiency 02/06/2023   Urinary frequency 05/27/2022   Leukocytes in urine 05/27/2022   Costochondritis 05/14/2022   Genetic testing 03/08/2021   Family history of ovarian cancer 02/16/2021   Uncontrolled type 2 diabetes  mellitus with hyperglycemia (HCC) 02/07/2021   Essential hypertension, benign 02/07/2021   Gastritis and gastroduodenitis    Nausea without vomiting    Adenomatous polyp of ascending colon    Adenomatous polyp of transverse colon    Adenomatous polyp of sigmoid colon    Rectal polyp    Adenomatous polyp of descending colon    Gall bladder pain 11/04/2019   Class 3 severe obesity due to excess calories with serious comorbidity and body mass index (BMI) of 60.0 to 69.9 in adult 08/13/2019   Nasal congestion 03/10/2019   Sinus pressure 03/10/2019   Hyperglycemia 01/22/2019   Hypertriglyceridemia 01/22/2019   Combined forms of age-related cataract of left eye 09/10/2017   Musculoskeletal neck pain 07/24/2017   Migraine without aura and without status migrainosus, not intractable 11/12/2016   Encounter for preventative adult health care exam with abnormal findings 09/01/2015   Sinusitis, chronic 09/01/2015   Cough 09/01/2015   Morbid obesity with BMI of 50.0-59.9, adult (HCC) 11/18/2014   RUQ pain 10/27/2014   Urinary incontinence 09/17/2013   Acute sinus infection 10/16/2012   Paresthesias 09/22/2012   Sleep apnea, obstructive 09/22/2012   Elevated BP 07/29/2012   Fatigue 07/10/2012   Anxiety 06/02/2012   Closed head injury 06/02/2012   Depression 06/02/2012   Facial pain 06/02/2012   Leg cramps 04/15/2011   Edema 12/21/2010   Chronic pain due to trauma 03/15/2007   Extrinsic asthma 03/15/2007   INSOMNIA, CHRONIC 11/01/2006   Allergic rhinitis 11/01/2006   DYSPEPSIA, CHRONIC 11/01/2006   Head injury 11/01/2006    PCP: Catheryn Slocumb MD  REFERRING PROVIDER:   Sheril Coy, MD    REFERRING DIAG: M17.0 (ICD-10-CM) - Degenerative arthritis of knee, bilateral   THERAPY DIAG:  Bilateral chronic knee pain  Muscle weakness (generalized)  Abnormal posture  Other abnormalities of gait and mobility  Rationale for Evaluation and Treatment: Rehabilitation  ONSET DATE:  1 year  SUBJECTIVE:   SUBJECTIVE STATEMENT:  Pt states, movement is better 1 day after session.  She is noticing she can get feet flat in pool now. She reports she has more fluid in her knees today.     POOL ACCESS: currently none.  Has [employer] that will pay for membership to aquatic center.  Has done aquatic therapy in the past.   eval: Want to avoid having TKR.  Bone spurs are on the back of my knee caps.  Twisted my knee about 1 years ago.  Have been using rollator on and off for past 6 months. Can't straighten my knee. Gel shots have helped.  Just had injections last week.  Have a few more scheduled.  Had a surgery in last few months and I have been somewhat immobile not moving much at all.SABRA  Sleep for about 4 hours before waking  PERTINENT HISTORY: TBI 2008/2014 PAIN:  Are you having pain? Yes: NPRS scale: current 3-4/10 Pain location: bilat knee joint Pain description: ache Aggravating factors: weight bearing Relieving factors: lidocaine  patches, OTC meds, mag cream  PRECAUTIONS: None  RED FLAGS: None   WEIGHT BEARING RESTRICTIONS: No  FALLS:  Has patient fallen in last 6 months? No tripped walking into the house  LIVING ENVIRONMENT: Lives with: lives with their family Lives in: House/apartment Stairs: 6 step into home Has following equipment at home: Single point cane and rollator and electric scooterlift chair, shower bench  OCCUPATION: disables  PLOF: Independent uses shower benches  PATIENT GOALS: build stamina, increase strength  NEXT MD VISIT: Friday  OBJECTIVE:  Note: Objective measures were completed at Evaluation unless otherwise noted.  DIAGNOSTIC FINDINGS: none in chart  PATIENT SURVEYS:  LEFS 12/80  COGNITION: Overall cognitive status: Within functional limits for tasks assessed     SENSATION: WFL   MUSCLE LENGTH: Hamstrings:shortened bilaterally   POSTURE: rounded shoulders, forward head, and knee flexed trunk forward  flexed.  PALPATION: Moderate TTP about knees bilat joint lines medial> Lateral  LOWER EXTREMITY ROM:  Active ROM Right eval Left eval  Hip flexion    Hip extension    Hip abduction    Hip adduction    Hip internal rotation    Hip external rotation    Knee flexion 100d 103  Knee extension -40 -34  Ankle dorsiflexion    Ankle plantarflexion    Ankle inversion    Ankle eversion     (Blank rows = not tested)  LOWER EXTREMITY MMT:  HD (lb) Tested in sitting Right eval Left eval  Hip flexion 44.4 45.9  Hip extension    Hip abduction 27.1 25.9  Hip adduction    Hip internal rotation    Hip external rotation    Knee flexion    Knee extension 33.1 25.6  Ankle dorsiflexion    Ankle plantarflexion    Ankle inversion    Ankle eversion     (Blank rows = not tested)   FUNCTIONAL TESTS:  Timed up and go (TUG): 23.91 STS transfers: heavy ue support from pool bench; forward momentum with ue elevated   GAIT: Distance walked: 100 ft Assistive device utilized: rollator Level of assistance: SBA Comments: bilateral flex contractures: no heel strike, sliding on toes when advancing LE during swing, forward flex trunk posture                                                                                                                                TREATMENT  [facility] Adult PT Treatment:  DATE: 10/31/23 Pt seen for aquatic therapy today.  Treatment took place in water 3.5-4.75 ft in depth at the [facility] pool. Temp of water was 91.  Pt entered and exited the pool via stairs using step-to pattern with heavy UE on hand rail.  * Yellow hand floats under water:  walking forward/backward - multiple laps, cues for heel/toe and toe/ heel *side stepping yellow hand floats and add/abdct- multiple laps * UE on yellow hand floats: 3 way LE kick x 10 each  * forward walking kick- backward marching/ walking * UE on yellow floats under  water, deeper water:hip add/abd; hip flex/extension; cycling;  -UE on corner: clams; knee tucks  * return to walking backwards toe/heel TKE * standing quad stretch with foot on step behind her x 30s each  * bil heels off of step for gastroc stretch x 20sec * Rt/Lt hamstring stretch with foot on 2nd step holding 20-45 sec each LE, 4 reps  PATIENT EDUCATION:  Education details: aquatic therapy progression/ modification Person educated: Patient Education method: Explanation Education comprehension: verbalized understanding  HOME EXERCISE PROGRAM: TBA  ASSESSMENT:  CLINICAL IMPRESSION: Good tolerance for exercises completed today.  Pt reported increased pain in knees upon exiting pool.   Therapist to check LTG next session. (Last authorized visit with insurance and POC ending 11/08/23).    Eval: Patient is a 58 y.o. f who was seen today for physical therapy evaluation and treatment for bilat knee OA. She presents today with husband assisting she is using a rollator.  May require wc assistance for therapy sessions.  Exam reveals significant bilateral knee flex contractures, limited knee flex, strength deficits, balance and proprioception deficits. Her gait is greatly impeded as she is unable to strike her heels and doesn't clear her feet from the floor/shuffles. She is a high fall risk due to posture and gait deficits.  She will benefit from skilled physical therapy both aquatic and land.  Will plan on focusing initially in aquatics on pain management, movement toleration improving ROM and strengthening then progress fairly quickly to alternating with land. Goals to improve all deficits which are significantly affecting her functional mobility and ADL's  OBJECTIVE IMPAIRMENTS: Abnormal gait, decreased activity tolerance, decreased balance, decreased coordination, decreased endurance, decreased knowledge of condition, decreased knowledge of use of DME, decreased mobility, difficulty walking,  decreased ROM, decreased strength, impaired flexibility, postural dysfunction, obesity, and pain.   ACTIVITY LIMITATIONS: carrying, lifting, bending, sitting, standing, squatting, sleeping, stairs, transfers, hygiene/grooming, and locomotion level  PARTICIPATION LIMITATIONS: meal prep, cleaning, laundry, driving, shopping, community activity, occupation, and yard work  PERSONAL FACTORS: Behavior pattern, Fitness, Past/current experiences, and Time since onset of injury/illness/exacerbation are also affecting patient's functional outcome.   REHAB POTENTIAL: Good  CLINICAL DECISION MAKING: Evolving/moderate complexity  EVALUATION COMPLEXITY: Moderate   GOALS: Goals reviewed with patient? Yes  SHORT TERM GOALS: Target date: 10/11/23 Pt will tolerate full aquatic sessions consistently without increase in pain and with improving function to demonstrate good toleration and effectiveness of intervention.  Baseline: Goal status:Met 10/14/23  2.  Pt will tolerate walking to and from setting and engaging in aquatic therapy session without excessive fatigue or increase in pain to demonstrate improved toleration to activity. Baseline: unable at eval Goal status: In progress 10/31/23  3.  Pt will be indep with stair negotiation into and out of pool consistently using hand rail Baseline:  Goal status: Met 10/14/23    4. Pt to verbalize understanding of the importance of exercise/movement on  overall well being.    Baseline: immobile for up towards 1 year    Goal status:Met 10/14/23   LONG TERM GOALS: Target date: 11/08/23  Pt to improve on LEFS by at least 9 point to demonstrate statistically significant Improvement in function. Baseline: 12/80 Goal status: INITIAL  2.  Pt will report decrease in pain by at least 50% for improved toleration to activity/quality of life and to demonstrate improved management of pain. Baseline:  Goal status: INITIAL  3.  Pt will improve strength in knee extension by  at least 10 lbs to demonstrate improved overall physical function Baseline:  Goal status: INITIAL  4.  Pt to improve bilateral knee extension by 50% for improved gait. Baseline:  Goal status: INITIAL    5. Pt will be indep with final HEP's (land and aquatic as appropriate) for continued management of condition    Baseline:    Goal status: INITIAL   PLAN:  PT FREQUENCY: 2x/week  PT DURATION: 8 weeks  PLANNED INTERVENTIONS: 97164- PT Re-evaluation, 97110-Therapeutic exercises, 97530- Therapeutic activity, 97112- Neuromuscular re-education, 97535- Self Care, [id number]- Manual therapy, [id number]- Gait training, [phone]- Orthotic Initial, [phone]- Aquatic Therapy, [phone]- Ionotophoresis 4mg /ml Dexamethasone , Patient/Family education, Balance training, Stair training, Taping, Dry Needling, Joint mobilization, DME instructions, Cryotherapy, and Moist heat  PLAN FOR NEXT SESSION: alternate land/aquatics for Knee ROM/strengthening; gait; balance an proprioception; Review HEP  Delon Aquas, [place] 10/31/23 3:30 PM [facility] [address] [place], [place], [address] Phone: [phone]   Fax:  [phone]   Date of referral: 08/09/23 Referring provider: Maude Graves MD Referring diagnosis? Bilat knee OA Treatment diagnosis? (if different than referring diagnosis) no  What was this (referring dx) caused by? Arthritis  Lysle of Condition: Chronic (continuous duration > 3 months)   Laterality: Both  Current Functional Measure Score: LEFS 12/80  Objective measurements identify impairments when they are compared to normal values, the uninvolved extremity, and prior level of function.  [x]  Yes  []  No  Objective assessment of functional ability: Severe functional limitations   Briefly describe symptoms: Bilat knee pain, flexion contractures, gait abnormality, strength deficits  How did symptoms start: OA  Average pain  intensity:  Last 24 hours: 3/10  Past week: 3/10  How often does the pt experience symptoms? Constantly  How much have the symptoms interfered with usual daily activities? Quite a bit  How has condition changed since care began at this facility? NA - initial visit  In general, how is the patients overall health? Good   BACK PAIN (STarT Back Screening Tool) No

## 2023-11-04 ENCOUNTER — Ambulatory Visit (HOSPITAL_BASED_OUTPATIENT_CLINIC_OR_DEPARTMENT_OTHER): Admitting: Physical Therapy

## 2023-11-04 ENCOUNTER — Encounter (HOSPITAL_BASED_OUTPATIENT_CLINIC_OR_DEPARTMENT_OTHER): Payer: Self-pay | Admitting: Physical Therapy

## 2023-11-04 DIAGNOSIS — M6281 Muscle weakness (generalized): Secondary | ICD-10-CM | POA: Diagnosis not present

## 2023-11-04 DIAGNOSIS — R2689 Other abnormalities of gait and mobility: Secondary | ICD-10-CM

## 2023-11-04 DIAGNOSIS — M25561 Pain in right knee: Secondary | ICD-10-CM | POA: Diagnosis not present

## 2023-11-04 DIAGNOSIS — R293 Abnormal posture: Secondary | ICD-10-CM | POA: Diagnosis not present

## 2023-11-04 DIAGNOSIS — G8929 Other chronic pain: Secondary | ICD-10-CM | POA: Diagnosis not present

## 2023-11-04 DIAGNOSIS — M25562 Pain in left knee: Secondary | ICD-10-CM | POA: Diagnosis not present

## 2023-11-04 NOTE — Therapy (Signed)
 OUTPATIENT PHYSICAL THERAPY LOWER EXTREMITY TREATMENT   Patient Name: [name] Wright MRN: [id number] DOB:Dec 21, 1965, 58 y.o., female Today's Date: 11/04/2023  END OF SESSION:  PT End of Session - 11/04/23 0942     Visit Number 15    Date for PT Re-Evaluation 11/08/23    Authorization Type [employer] mcr approved 16 visits 6/3 - 7-29    Authorization - Visit Number 15    Authorization - Number of Visits 16    Progress Note Due on Visit 19    PT Start Time 0930    PT Stop Time 1012    PT Time Calculation (min) 42 min    Activity Tolerance Patient tolerated treatment well    Behavior During Therapy WFL for tasks assessed/performed            Past Medical History:  Diagnosis Date   Acute cystitis    Allergic rhinitis    Childhood asthma    no problems as adult - no inhaler   Chronic insomnia    Chronic pain due to trauma 02/2001   closed head trauma - Followed by Dr Elspeth Mems [facility]   Complication of anesthesia    Dyspepsia    Family history of ovarian cancer    Head trauma 02/24/2001   closed   History of kidney stones    passed stone - no surgery required   Hypertension    Hypothyroidism    PONV (postoperative nausea and vomiting)    Pre-diabetes    Sleep apnea    Pt denies   SVD (spontaneous vaginal delivery)    fetal demise at 5 months   Past Surgical History:  Procedure Laterality Date   APPENDECTOMY     BIOPSY  08/25/2020   Procedure: BIOPSY;  Surgeon: San Sandor GAILS, DO;  Location: [facility];  Service: Gastroenterology;;   COLONOSCOPY WITH PROPOFOL  N/A 08/25/2020   Procedure: COLONOSCOPY WITH PROPOFOL ;  Surgeon: San Sandor GAILS, DO;  Location: [facility];  Service: Gastroenterology;  Laterality: N/A;   DILATATION & CURETTAGE/HYSTEROSCOPY WITH MYOSURE N/A 04/15/2017   Procedure: DILATATION & CURETTAGE/HYSTEROSCOPY WITH MYOSURE POLYPECTOMY;  Surgeon: Curlene Agent, MD;  Location: [facility];  Service: Gynecology;  Laterality:  N/A;   DILATATION & CURETTAGE/HYSTEROSCOPY WITH MYOSURE N/A 06/18/2019   Procedure: DILATATION & CURETTAGE/HYSTEROSCOPY WITH  MYOSURE;  Surgeon: Curlene Agent, MD;  Location: [facility];  Service: Gynecology;  Laterality: N/A;   DILATATION & CURETTAGE/HYSTEROSCOPY WITH MYOSURE N/A 06/07/2023   Procedure: DILATATION & CURETTAGE/HYSTEROSCOPY WITH  MYOSURE;  Surgeon: Curlene Agent, MD;  Location: [facility] OR;  Service: Gynecology;  Laterality: N/A;  POSSIBLE MYOSURE [facility]   ESOPHAGOGASTRODUODENOSCOPY (EGD) WITH PROPOFOL  N/A 08/25/2020   Procedure: ESOPHAGOGASTRODUODENOSCOPY (EGD) WITH PROPOFOL ;  Surgeon: San Sandor GAILS, DO;  Location: [facility];  Service: Gastroenterology;  Laterality: N/A;   EYE SURGERY     cataracts removed   INCONTINENCE SURGERY     PILONIDAL CYST EXCISION     x2   POLYPECTOMY  08/25/2020   Procedure: POLYPECTOMY;  Surgeon: San Sandor GAILS, DO;  Location: [facility];  Service: Gastroenterology;;   repair of toe laceration     dermabond to right big toe right foot   repair of torn ligament right leg     patient denies this surgery   right foot fracture     cast only   TONSILLECTOMY     WISDOM TOOTH EXTRACTION     Patient Active Problem List   Diagnosis Date Noted  Urgency of urination 09/26/2023   Vertigo 08/19/2023   Swimmer's ear 08/19/2023   Rash and nonspecific skin eruption 08/19/2023   Chronic pain of both knees 08/17/2023   Thrush, oral 07/07/2023   Female climacteric state 05/14/2023   Pre-op exam 05/14/2023   Class 3 severe obesity due to excess calories with body mass index (BMI) of 50.0 to 59.9 in adult 03/25/2023   Primary hypothyroidism 03/25/2023   Otalgia, right ear 03/25/2023   Vitamin D  deficiency 02/06/2023   Vitamin B12 deficiency 02/06/2023   Urinary frequency 05/27/2022   Leukocytes in urine 05/27/2022   Costochondritis 05/14/2022   Genetic testing 03/08/2021   Family history of ovarian cancer 02/16/2021   Uncontrolled type 2 diabetes  mellitus with hyperglycemia (HCC) 02/07/2021   Essential hypertension, benign 02/07/2021   Gastritis and gastroduodenitis    Nausea without vomiting    Adenomatous polyp of ascending colon    Adenomatous polyp of transverse colon    Adenomatous polyp of sigmoid colon    Rectal polyp    Adenomatous polyp of descending colon    Gall bladder pain 11/04/2019   Class 3 severe obesity due to excess calories with serious comorbidity and body mass index (BMI) of 60.0 to 69.9 in adult 08/13/2019   Nasal congestion 03/10/2019   Sinus pressure 03/10/2019   Hyperglycemia 01/22/2019   Hypertriglyceridemia 01/22/2019   Combined forms of age-related cataract of left eye 09/10/2017   Musculoskeletal neck pain 07/24/2017   Migraine without aura and without status migrainosus, not intractable 11/12/2016   Encounter for preventative adult health care exam with abnormal findings 09/01/2015   Sinusitis, chronic 09/01/2015   Cough 09/01/2015   Morbid obesity with BMI of 50.0-59.9, adult (HCC) 11/18/2014   RUQ pain 10/27/2014   Urinary incontinence 09/17/2013   Acute sinus infection 10/16/2012   Paresthesias 09/22/2012   Sleep apnea, obstructive 09/22/2012   Elevated BP 07/29/2012   Fatigue 07/10/2012   Anxiety 06/02/2012   Closed head injury 06/02/2012   Depression 06/02/2012   Facial pain 06/02/2012   Leg cramps 04/15/2011   Edema 12/21/2010   Chronic pain due to trauma 03/15/2007   Extrinsic asthma 03/15/2007   INSOMNIA, CHRONIC 11/01/2006   Allergic rhinitis 11/01/2006   DYSPEPSIA, CHRONIC 11/01/2006   Head injury 11/01/2006    PCP: Catheryn Slocumb MD  REFERRING PROVIDER:   Sheril Coy, MD    REFERRING DIAG: M17.0 (ICD-10-CM) - Degenerative arthritis of knee, bilateral   THERAPY DIAG:  Bilateral chronic knee pain  Muscle weakness (generalized)  Abnormal posture  Other abnormalities of gait and mobility  Rationale for Evaluation and Treatment: Rehabilitation  ONSET DATE:  1 year  SUBJECTIVE:   SUBJECTIVE STATEMENT: Pt reports she plans to visit [facility] this week.  She reports that she has more soreness in buttocks and thighs after session, but it's working the kinks out.  She reports she drove today and walked in from parking lot with rollator, it was easier than the last time I did that.      POOL ACCESS: currently none.  Has [employer] that will pay for membership to aquatic center.  Has done aquatic therapy in the past.   eval: Want to avoid having TKR.  Bone spurs are on the back of my knee caps.  Twisted my knee about 1 years ago.  Have been using rollator on and off for past 6 months. Can't straighten my knee. Gel shots have helped.  Just had injections last week.  Have a  few more scheduled.  Had a surgery in last few months and I have been somewhat immobile not moving much at all.. Sleep for about 4 hours before waking  PERTINENT HISTORY: TBI 2008/2014 PAIN:  Are you having pain? Yes: NPRS scale: current 3/10 Pain location: bilat knee joint, buttocks, thighs Pain description: ache Aggravating factors: weight bearing Relieving factors: lidocaine  patches, OTC meds, mag cream  PRECAUTIONS: None  RED FLAGS: None   WEIGHT BEARING RESTRICTIONS: No  FALLS:  Has patient fallen in last 6 months? No tripped walking into the house  LIVING ENVIRONMENT: Lives with: lives with their family Lives in: House/apartment Stairs: 6 step into home Has following equipment at home: Single point cane and rollator and electric scooterlift chair, shower bench  OCCUPATION: disables  PLOF: Independent uses shower benches  PATIENT GOALS: build stamina, increase strength  NEXT MD VISIT: Friday  OBJECTIVE:  Note: Objective measures were completed at Evaluation unless otherwise noted.  DIAGNOSTIC FINDINGS: none in chart  PATIENT SURVEYS:  LEFS 12/80  COGNITION: Overall cognitive status: Within functional limits for tasks  assessed     SENSATION: WFL   MUSCLE LENGTH: Hamstrings:shortened bilaterally   POSTURE: rounded shoulders, forward head, and knee flexed trunk forward flexed.  PALPATION: Moderate TTP about knees bilat joint lines medial> Lateral  LOWER EXTREMITY ROM:  Active ROM Right eval Left eval  Hip flexion    Hip extension    Hip abduction    Hip adduction    Hip internal rotation    Hip external rotation    Knee flexion 100d 103  Knee extension -40 -34  Ankle dorsiflexion    Ankle plantarflexion    Ankle inversion    Ankle eversion     (Blank rows = not tested)  LOWER EXTREMITY MMT:  HD (lb) Tested in sitting Right eval Left eval  Hip flexion 44.4 45.9  Hip extension    Hip abduction 27.1 25.9  Hip adduction    Hip internal rotation    Hip external rotation    Knee flexion    Knee extension 33.1 25.6  Ankle dorsiflexion    Ankle plantarflexion    Ankle inversion    Ankle eversion     (Blank rows = not tested)   FUNCTIONAL TESTS:  Timed up and go (TUG): 23.91 STS transfers: heavy ue support from pool bench; forward momentum with ue elevated   GAIT: Distance walked: 100 ft Assistive device utilized: rollator Level of assistance: SBA Comments: bilateral flex contractures: no heel strike, sliding on toes when advancing LE during swing, forward flex trunk posture                                                                                                                                TREATMENT  [facility] Adult PT Treatment:  DATE: 10/31/23 Pt seen for aquatic therapy today.  Treatment took place in water 3.5-4.75 ft in depth at the [facility] pool. Temp of water was 91.  Pt entered and exited the pool via stairs using step-to pattern with heavy UE on hand rail.  * Yellow hand floats under water:  walking forward/backward - multiple laps, cues for heel/toe and toe/ heel *side stepping yellow hand floats  and add/abdct-> into wide squat  * forward walking kick * seated on bench: alternating LAQ with DF x 10; hip abdct/ add x 10;  flutter kick; clams x 10 * squats pushing yellow hand float under water x 12 * UE on yellow hand floats: 3 way LE kick 2 x 10 each  * return to walking  * UE on corner:hip add/abd; hip flex/extension; cycling; knee tucks and clams * standing quad stretch with foot on step behind her x 30s each  * bil heels off of step for gastroc stretch x 20sec * Rt/Lt hamstring stretch with foot on 2nd step holding 20-45 sec each LE, 4 reps  PATIENT EDUCATION:  Education details: aquatic therapy progression/ modification Person educated: Patient Education method: Explanation Education comprehension: verbalized understanding  HOME EXERCISE PROGRAM: AQUATIC Access Code: [email or web address] URL: [email or web address] Date: 11/04/2023 Prepared by: [facility] This aquatic home exercise program from [employer] utilizes pictures from land based exercises, but has been adapted prior to lamination and issuance.     ASSESSMENT:  CLINICAL IMPRESSION: Good tolerance for exercises completed today. Issued and instructed pt on aquatic HEP.  Pt reported increased pain in knees upon exiting pool.  Pt has met STG2 and progressing well towards remaining goals. Therapist to check LTG next session. (Last authorized visit with insurance and POC ending 11/08/23).    Eval: Patient is a 58 y.o. f who was seen today for physical therapy evaluation and treatment for bilat knee OA. She presents today with husband assisting she is using a rollator.  May require wc assistance for therapy sessions.  Exam reveals significant bilateral knee flex contractures, limited knee flex, strength deficits, balance and proprioception deficits. Her gait is greatly impeded as she is unable to strike her heels and doesn't clear her feet from the floor/shuffles. She is a high fall risk due  to posture and gait deficits.  She will benefit from skilled physical therapy both aquatic and land.  Will plan on focusing initially in aquatics on pain management, movement toleration improving ROM and strengthening then progress fairly quickly to alternating with land. Goals to improve all deficits which are significantly affecting her functional mobility and ADL's  OBJECTIVE IMPAIRMENTS: Abnormal gait, decreased activity tolerance, decreased balance, decreased coordination, decreased endurance, decreased knowledge of condition, decreased knowledge of use of DME, decreased mobility, difficulty walking, decreased ROM, decreased strength, impaired flexibility, postural dysfunction, obesity, and pain.   ACTIVITY LIMITATIONS: carrying, lifting, bending, sitting, standing, squatting, sleeping, stairs, transfers, hygiene/grooming, and locomotion level  PARTICIPATION LIMITATIONS: meal prep, cleaning, laundry, driving, shopping, community activity, occupation, and yard work  PERSONAL FACTORS: Behavior pattern, Fitness, Past/current experiences, and Time since onset of injury/illness/exacerbation are also affecting patient's functional outcome.   REHAB POTENTIAL: Good  CLINICAL DECISION MAKING: Evolving/moderate complexity  EVALUATION COMPLEXITY: Moderate   GOALS: Goals reviewed with patient? Yes  SHORT TERM GOALS: Target date: 10/11/23 Pt will tolerate full aquatic sessions consistently without increase in pain and with improving function to demonstrate good toleration and effectiveness of intervention.  Baseline: Goal status:Met 10/14/23  2.  Pt will tolerate walking to and from setting and engaging in aquatic therapy session without excessive fatigue or increase in pain to demonstrate improved toleration to activity. Baseline: unable at eval Goal status: Met - 11/04/23  3.  Pt will be indep with stair negotiation into and out of pool consistently using hand rail Baseline:  Goal status: Met  10/14/23    4. Pt to verbalize understanding of the importance of exercise/movement on overall well being.    Baseline: immobile for up towards 1 year    Goal status:Met 10/14/23   LONG TERM GOALS: Target date: 11/08/23  Pt to improve on LEFS by at least 9 point to demonstrate statistically significant Improvement in function. Baseline: 12/80 Goal status: INITIAL  2.  Pt will report decrease in pain by at least 50% for improved toleration to activity/quality of life and to demonstrate improved management of pain. Baseline:  Goal status: INITIAL  3.  Pt will improve strength in knee extension by at least 10 lbs to demonstrate improved overall physical function Baseline:  Goal status: INITIAL  4.  Pt to improve bilateral knee extension by 50% for improved gait. Baseline:  Goal status: INITIAL    5. Pt will be indep with final HEP's (land and aquatic as appropriate) for continued management of condition    Baseline:    Goal status: In progress -11/04/23   PLAN:  PT FREQUENCY: 2x/week  PT DURATION: 8 weeks  PLANNED INTERVENTIONS: 97164- PT Re-evaluation, 97110-Therapeutic exercises, 97530- Therapeutic activity, 97112- Neuromuscular re-education, 97535- Self Care, [id number]- Manual therapy, [phone]- Gait training, [phone]- Orthotic Initial, [phone]- Aquatic Therapy, [phone]- Ionotophoresis 4mg /ml Dexamethasone , Patient/Family education, Balance training, Stair training, Taping, Dry Needling, Joint mobilization, DME instructions, Cryotherapy, and Moist heat  PLAN FOR NEXT SESSION: alternate land/aquatics for Knee ROM/strengthening; gait; balance an proprioception; Review HEP  Delon Aquas, PTA 11/04/23 10:31 AM [facility] [address] [place], [place], [address] Phone: [phone]   Fax:  [phone]    Date of referral: 08/09/23 Referring provider: Maude Graves MD Referring diagnosis? Bilat knee OA Treatment diagnosis? (if  different than referring diagnosis) no  What was this (referring dx) caused by? Arthritis  Lysle of Condition: Chronic (continuous duration > 3 months)   Laterality: Both  Current Functional Measure Score: LEFS 12/80  Objective measurements identify impairments when they are compared to normal values, the uninvolved extremity, and prior level of function.  [x]  Yes  []  No  Objective assessment of functional ability: Severe functional limitations   Briefly describe symptoms: Bilat knee pain, flexion contractures, gait abnormality, strength deficits  How did symptoms start: OA  Average pain intensity:  Last 24 hours: 3/10  Past week: 3/10  How often does the pt experience symptoms? Constantly  How much have the symptoms interfered with usual daily activities? Quite a bit  How has condition changed since care began at this facility? NA - initial visit  In general, how is the patients overall health? Good   BACK PAIN (STarT Back Screening Tool) No

## 2023-11-07 DIAGNOSIS — M25562 Pain in left knee: Secondary | ICD-10-CM | POA: Diagnosis not present

## 2023-11-07 DIAGNOSIS — G894 Chronic pain syndrome: Secondary | ICD-10-CM | POA: Diagnosis not present

## 2023-11-07 DIAGNOSIS — Z5181 Encounter for therapeutic drug level monitoring: Secondary | ICD-10-CM | POA: Diagnosis not present

## 2023-11-07 DIAGNOSIS — G4733 Obstructive sleep apnea (adult) (pediatric): Secondary | ICD-10-CM | POA: Diagnosis not present

## 2023-11-07 DIAGNOSIS — M542 Cervicalgia: Secondary | ICD-10-CM | POA: Diagnosis not present

## 2023-11-07 DIAGNOSIS — Z79891 Long term (current) use of opiate analgesic: Secondary | ICD-10-CM | POA: Diagnosis not present

## 2023-11-07 DIAGNOSIS — R519 Headache, unspecified: Secondary | ICD-10-CM | POA: Diagnosis not present

## 2023-11-07 DIAGNOSIS — R202 Paresthesia of skin: Secondary | ICD-10-CM | POA: Diagnosis not present

## 2023-11-07 DIAGNOSIS — M25561 Pain in right knee: Secondary | ICD-10-CM | POA: Diagnosis not present

## 2023-11-07 DIAGNOSIS — M7918 Myalgia, other site: Secondary | ICD-10-CM | POA: Diagnosis not present

## 2023-11-08 ENCOUNTER — Encounter (HOSPITAL_BASED_OUTPATIENT_CLINIC_OR_DEPARTMENT_OTHER): Payer: Self-pay | Admitting: Physical Therapy

## 2023-11-08 ENCOUNTER — Ambulatory Visit (HOSPITAL_BASED_OUTPATIENT_CLINIC_OR_DEPARTMENT_OTHER): Attending: Orthopaedic Surgery | Admitting: Physical Therapy

## 2023-11-08 DIAGNOSIS — M6281 Muscle weakness (generalized): Secondary | ICD-10-CM | POA: Insufficient documentation

## 2023-11-08 DIAGNOSIS — R2689 Other abnormalities of gait and mobility: Secondary | ICD-10-CM | POA: Insufficient documentation

## 2023-11-08 DIAGNOSIS — M25562 Pain in left knee: Secondary | ICD-10-CM | POA: Diagnosis not present

## 2023-11-08 DIAGNOSIS — M25561 Pain in right knee: Secondary | ICD-10-CM | POA: Insufficient documentation

## 2023-11-08 DIAGNOSIS — G8929 Other chronic pain: Secondary | ICD-10-CM | POA: Insufficient documentation

## 2023-11-08 DIAGNOSIS — R293 Abnormal posture: Secondary | ICD-10-CM | POA: Insufficient documentation

## 2023-11-08 NOTE — Therapy (Signed)
 OUTPATIENT PHYSICAL THERAPY LOWER EXTREMITY TREATMENT  Re-cert  Patient Name: MARCELLINA JONSSON MRN: [id number] DOB:July 07, 1965, 58 y.o., female Today's Date: 11/08/2023  END OF SESSION:  PT End of Session - 11/08/23 1004     Visit Number 16    Number of Visits --    Date for PT Re-Evaluation 01/03/24    Authorization Type [employer] mcr approved 16 visits 6/3 - 7-29    Authorization - Visit Number 16    Authorization - Number of Visits 16    Progress Note Due on Visit 26    PT Start Time 0845    PT Stop Time 0930    PT Time Calculation (min) 45 min    Activity Tolerance Patient tolerated treatment well    Behavior During Therapy [facility] for tasks assessed/performed             Past Medical History:  Diagnosis Date   Acute cystitis    Allergic rhinitis    Childhood asthma    no problems as adult - no inhaler   Chronic insomnia    Chronic pain due to trauma 02/2001   closed head trauma - Followed by Dr Elspeth Mems [facility]   Complication of anesthesia    Dyspepsia    Family history of ovarian cancer    Head trauma 02/24/2001   closed   History of kidney stones    passed stone - no surgery required   Hypertension    Hypothyroidism    PONV (postoperative nausea and vomiting)    Pre-diabetes    Sleep apnea    Pt denies   SVD (spontaneous vaginal delivery)    fetal demise at 5 months   Past Surgical History:  Procedure Laterality Date   APPENDECTOMY     BIOPSY  08/25/2020   Procedure: BIOPSY;  Surgeon: San Sandor GAILS, DO;  Location: [facility];  Service: Gastroenterology;;   COLONOSCOPY WITH PROPOFOL  N/A 08/25/2020   Procedure: COLONOSCOPY WITH PROPOFOL ;  Surgeon: San Sandor GAILS, DO;  Location: [facility];  Service: Gastroenterology;  Laterality: N/A;   DILATATION & CURETTAGE/HYSTEROSCOPY WITH MYOSURE N/A 04/15/2017   Procedure: DILATATION & CURETTAGE/HYSTEROSCOPY WITH MYOSURE POLYPECTOMY;  Surgeon: Curlene Agent, MD;  Location: [facility];   Service: Gynecology;  Laterality: N/A;   DILATATION & CURETTAGE/HYSTEROSCOPY WITH MYOSURE N/A 06/18/2019   Procedure: DILATATION & CURETTAGE/HYSTEROSCOPY WITH  MYOSURE;  Surgeon: Curlene Agent, MD;  Location: [facility];  Service: Gynecology;  Laterality: N/A;   DILATATION & CURETTAGE/HYSTEROSCOPY WITH MYOSURE N/A 06/07/2023   Procedure: DILATATION & CURETTAGE/HYSTEROSCOPY WITH  MYOSURE;  Surgeon: Curlene Agent, MD;  Location: [facility] OR;  Service: Gynecology;  Laterality: N/A;  POSSIBLE MYOSURE [facility]   ESOPHAGOGASTRODUODENOSCOPY (EGD) WITH PROPOFOL  N/A 08/25/2020   Procedure: ESOPHAGOGASTRODUODENOSCOPY (EGD) WITH PROPOFOL ;  Surgeon: San Sandor GAILS, DO;  Location: [facility];  Service: Gastroenterology;  Laterality: N/A;   EYE SURGERY     cataracts removed   INCONTINENCE SURGERY     PILONIDAL CYST EXCISION     x2   POLYPECTOMY  08/25/2020   Procedure: POLYPECTOMY;  Surgeon: San Sandor GAILS, DO;  Location: [facility];  Service: Gastroenterology;;   repair of toe laceration     dermabond to right big toe right foot   repair of torn ligament right leg     patient denies this surgery   right foot fracture     cast only   TONSILLECTOMY     WISDOM TOOTH EXTRACTION     Patient Active  Problem List   Diagnosis Date Noted   Urgency of urination 09/26/2023   Vertigo 08/19/2023   Swimmer's ear 08/19/2023   Rash and nonspecific skin eruption 08/19/2023   Chronic pain of both knees 08/17/2023   Thrush, oral 07/07/2023   Female climacteric state 05/14/2023   Pre-op exam 05/14/2023   Class 3 severe obesity due to excess calories with body mass index (BMI) of 50.0 to 59.9 in adult 03/25/2023   Primary hypothyroidism 03/25/2023   Otalgia, right ear 03/25/2023   Vitamin D  deficiency 02/06/2023   Vitamin B12 deficiency 02/06/2023   Urinary frequency 05/27/2022   Leukocytes in urine 05/27/2022   Costochondritis 05/14/2022   Genetic testing 03/08/2021   Family history of ovarian cancer 02/16/2021    Uncontrolled type 2 diabetes mellitus with hyperglycemia (HCC) 02/07/2021   Essential hypertension, benign 02/07/2021   Gastritis and gastroduodenitis    Nausea without vomiting    Adenomatous polyp of ascending colon    Adenomatous polyp of transverse colon    Adenomatous polyp of sigmoid colon    Rectal polyp    Adenomatous polyp of descending colon    Gall bladder pain 11/04/2019   Class 3 severe obesity due to excess calories with serious comorbidity and body mass index (BMI) of 60.0 to 69.9 in adult 08/13/2019   Nasal congestion 03/10/2019   Sinus pressure 03/10/2019   Hyperglycemia 01/22/2019   Hypertriglyceridemia 01/22/2019   Combined forms of age-related cataract of left eye 09/10/2017   Musculoskeletal neck pain 07/24/2017   Migraine without aura and without status migrainosus, not intractable 11/12/2016   Encounter for preventative adult health care exam with abnormal findings 09/01/2015   Sinusitis, chronic 09/01/2015   Cough 09/01/2015   Morbid obesity with BMI of 50.0-59.9, adult (HCC) 11/18/2014   RUQ pain 10/27/2014   Urinary incontinence 09/17/2013   Acute sinus infection 10/16/2012   Paresthesias 09/22/2012   Sleep apnea, obstructive 09/22/2012   Elevated BP 07/29/2012   Fatigue 07/10/2012   Anxiety 06/02/2012   Closed head injury 06/02/2012   Depression 06/02/2012   Facial pain 06/02/2012   Leg cramps 04/15/2011   Edema 12/21/2010   Chronic pain due to trauma 03/15/2007   Extrinsic asthma 03/15/2007   INSOMNIA, CHRONIC 11/01/2006   Allergic rhinitis 11/01/2006   DYSPEPSIA, CHRONIC 11/01/2006   Head injury 11/01/2006    PCP: Catheryn Slocumb MD  REFERRING PROVIDER:   Sheril Coy, MD    REFERRING DIAG: M17.0 (ICD-10-CM) - Degenerative arthritis of knee, bilateral   THERAPY DIAG:  Bilateral chronic knee pain - Plan: PT plan of care cert/re-cert  Muscle weakness (generalized) - Plan: PT plan of care cert/re-cert  Abnormal posture - Plan: PT  plan of care cert/re-cert  Rationale for Evaluation and Treatment: Rehabilitation  ONSET DATE: 1 year  SUBJECTIVE:   SUBJECTIVE STATEMENT: Pt has been walking to and from setting daily with reports of some fatigue but no increase in pain.  She feels she is getting strong and able to tolerate more activity. Considering membership here at [facility] for pool access.     POOL ACCESS: currently none.  Has [employer] that will pay for membership to aquatic center.  Has done aquatic therapy in the past.   eval: Want to avoid having TKR.  Bone spurs are on the back of my knee caps.  Twisted my knee about 1 years ago.  Have been using rollator on and off for past 6 months. Can't straighten my knee. Gel shots have  helped.  Just had injections last week.  Have a few more scheduled.  Had a surgery in last few months and I have been somewhat immobile not moving much at all.. Sleep for about 4 hours before waking  PERTINENT HISTORY: TBI 2008/2014 PAIN:  Are you having pain? Yes: NPRS scale: current 3/10 Pain location: bilat knee joint, buttocks, thighs Pain description: ache Aggravating factors: weight bearing Relieving factors: lidocaine  patches, OTC meds, mag cream  PRECAUTIONS: None  RED FLAGS: None   WEIGHT BEARING RESTRICTIONS: No  FALLS:  Has patient fallen in last 6 months? No tripped walking into the house  LIVING ENVIRONMENT: Lives with: lives with their family Lives in: House/apartment Stairs: 6 step into home Has following equipment at home: Single point cane and rollator and electric scooterlift chair, shower bench  OCCUPATION: disables  PLOF: Independent uses shower benches  PATIENT GOALS: build stamina, increase strength  NEXT MD VISIT: Friday  OBJECTIVE:  Note: Objective measures were completed at Evaluation unless otherwise noted.  DIAGNOSTIC FINDINGS: none in chart  PATIENT SURVEYS:  LEFS 12/80  COGNITION: Overall cognitive status: Within  functional limits for tasks assessed     SENSATION: WFL   MUSCLE LENGTH: Hamstrings:shortened bilaterally   POSTURE: rounded shoulders, forward head, and knee flexed trunk forward flexed.  PALPATION: Moderate TTP about knees bilat joint lines medial> Lateral  LOWER EXTREMITY ROM:  Active ROM Right eval Left eval R / L 11/08/23  Hip flexion     Hip extension     Hip abduction     Hip adduction     Hip internal rotation     Hip external rotation     Knee flexion 100d 103 98 / 100  Knee extension -40 -34 -38 / -30  Ankle dorsiflexion   Wfl / wfl  Ankle plantarflexion     Ankle inversion     Ankle eversion      (Blank rows = not tested)  LOWER EXTREMITY MMT:  HD (lb) Tested in sitting Right eval Left eval R / L 11/08/23  Hip flexion 44.4 45.9   Hip extension     Hip abduction 27.1 25.9   Hip adduction     Hip internal rotation     Hip external rotation     Knee flexion     Knee extension 33.1 25.6 32.4 / 35.2  Ankle dorsiflexion     Ankle plantarflexion     Ankle inversion     Ankle eversion      (Blank rows = not tested)   FUNCTIONAL TESTS:  Timed up and go (TUG): 23.91 STS transfers: heavy ue support from pool bench; forward momentum with ue elevated   GAIT: Distance walked: 100 ft Assistive device utilized: rollator Level of assistance: SBA Comments: bilateral flex contractures: no heel strike, sliding on toes when advancing LE during swing, forward flex trunk posture   11/08/23: 400 ft x 2  using rollator   Forward hip flex, knee flex forearms intermittently on handles, continues to slide toes acroos floor to advance le but is able to clear foot 1/3 of time with cues  TREATMENT  [facility] Adult PT Treatment:                                                DATE: 11/08/23 Re-assessment test Self care  Pt seen for aquatic therapy  today.  Treatment took place in water 3.5-4.75 ft in depth at the [facility] pool. Temp of water was 91.  Pt entered and exited the pool via stairs using step-to pattern with heavy UE on hand rail.  * Yellow hand floats under water:  walking forward/backward - multiple laps, cues for heel/toe and toe/ heel *side stepping yellow hand floats and add/abdct-> into wide squat  * forward walking kick * seated on bench: alternating LAQ with DF x 10; hip abdct/ add x 10;  flutter kick; clams x 10 * squats pushing yellow hand float under water 3x 10 *Prone suspension using noodle for hip flex stretch * UE on corner:hip add/abd; hip flex/extension; cycling; knee tucks and clams  PATIENT EDUCATION:  Education details: aquatic therapy progression/ modification Person educated: Patient Education method: Explanation Education comprehension: verbalized understanding  HOME EXERCISE PROGRAM: AQUATIC Access Code: [email or web address] URL: [email or web address] Date: 11/04/2023 Prepared by: [facility] [address] [address] This aquatic home exercise program from [employer] utilizes pictures from land based exercises, but has been adapted prior to lamination and issuance.     ASSESSMENT:  CLINICAL IMPRESSION: PN pt reports improvement in pain by 25- 50% since inset of therapy and is now intermittent rather than continuous. She has met all STG demonstrating an improvement in amb tolerance and ability.  She continues to have significant flex contractures in bilateral knees with slow progress as well as with knee extension strength. Due to her reduction in pain her endurance and toleration to all activity has improved. Will plan on increasing land based session to add load for improved strengthening and ROM of knees/hips/ankles. If time allows, land based therapist please get ROM measurements of bilat ankle DF and PF as ankle range is limited but difficult to measure in setting. Pt engages  in aquatic therapy with very good toleration and good knowledge of exercises.  She is able to use the properties of water to improve movement, decrease ms spasm, improve balance and gait and reduce pain. LTG added. Goals ongoing      Eval: Patient is a 58 y.o. f who was seen today for physical therapy evaluation and treatment for bilat knee OA. She presents today with husband assisting she is using a rollator.  May require wc assistance for therapy sessions.  Exam reveals significant bilateral knee flex contractures, limited knee flex, strength deficits, balance and proprioception deficits. Her gait is greatly impeded as she is unable to strike her heels and doesn't clear her feet from the floor/shuffles. She is a high fall risk due to posture and gait deficits.  She will benefit from skilled physical therapy both aquatic and land.  Will plan on focusing initially in aquatics on pain management, movement toleration improving ROM and strengthening then progress fairly quickly to alternating with land. Goals to improve all deficits which are significantly affecting her functional mobility and ADL's  OBJECTIVE IMPAIRMENTS: Abnormal gait, decreased activity tolerance, decreased balance, decreased coordination, decreased endurance, decreased knowledge of condition, decreased knowledge of use of DME, decreased mobility, difficulty walking, decreased ROM, decreased strength, impaired flexibility, postural dysfunction, obesity, and  pain.   ACTIVITY LIMITATIONS: carrying, lifting, bending, sitting, standing, squatting, sleeping, stairs, transfers, hygiene/grooming, and locomotion level  PARTICIPATION LIMITATIONS: meal prep, cleaning, laundry, driving, shopping, community activity, occupation, and yard work  PERSONAL FACTORS: Behavior pattern, Fitness, Past/current experiences, and Time since onset of injury/illness/exacerbation are also affecting patient's functional outcome.   REHAB POTENTIAL:  Good  CLINICAL DECISION MAKING: Evolving/moderate complexity  EVALUATION COMPLEXITY: Moderate   GOALS: Goals reviewed with patient? Yes  SHORT TERM GOALS: Target date: 10/11/23 Pt will tolerate full aquatic sessions consistently without increase in pain and with improving function to demonstrate good toleration and effectiveness of intervention.  Baseline: Goal status:Met 10/14/23  2.  Pt will tolerate walking to and from setting and engaging in aquatic therapy session without excessive fatigue or increase in pain to demonstrate improved toleration to activity. Baseline: unable at eval Goal status: Met - 11/04/23  3.  Pt will be indep with stair negotiation into and out of pool consistently using hand rail Baseline:  Goal status: Met 10/14/23    4. Pt to verbalize understanding of the importance of exercise/movement on overall well being.    Baseline: immobile for up towards 1 year    Goal status:Met 10/14/23   LONG TERM GOALS: Target date: 11/08/23  Pt to improve on LEFS by at least 9 point to demonstrate statistically significant Improvement in function. Baseline: 12/80; 21/80 Goal status: Met 11/08/23  2.  Pt will report decrease in pain by at least 50% for improved toleration to activity/quality of life and to demonstrate improved management of pain. Baseline:  Goal status: In progress 11/08/23  3.  Pt will improve strength in knee extension by at least 10 lbs to demonstrate improved overall physical function Baseline:  Goal status: in Progress 11/08/23  4.  Pt to improve bilateral knee extension by 50% for improved gait. Baseline:  Goal status: In progress 11/08/23    5. Pt will be indep with final HEP's (land and aquatic as appropriate) for continued management of condition    Baseline:    Goal status: In progress -11/04/23   6. Pt will improve bilateral ankle DF to 10d to improve heel strike with gait.   Baseline:<neutral   Goal Status: New    7.Pt will improve standing  posture as demonstrated by standing with rollator and walking with hands on bars at all times   Baseline: 50-75% of time leaning on forearms.   Goal Status: New   8. Pt will amb 1000 ft using ad in upright position striking heels 50% of time.   Baseline: 400-500 ft leaning on forearms at least 50% of time; heel strike  PLAN:  PT FREQUENCY: 2x/week  PT DURATION: 8 weeks  PLANNED INTERVENTIONS: 97164- PT Re-evaluation, 97110-Therapeutic exercises, 97530- Therapeutic activity, [id number]- Neuromuscular re-education, 97535- Self Care, [id number]- Manual therapy, [id number]- Gait training, [phone]- Orthotic Initial, [phone]- Aquatic Therapy, [phone]- Ionotophoresis 4mg /ml Dexamethasone , Patient/Family education, Balance training, Stair training, Taping, Dry Needling, Joint mobilization, DME instructions, Cryotherapy, and Moist heat  PLAN FOR NEXT SESSION: alternate land/aquatics for Knee ROM/strengthening; gait; balance an proprioception; Review HEP  Ronal Foots) Aceyn Kathol MPT 11/08/23 3:00 PM [facility] [address] [place], [place], [address] Phone: [phone]   Fax:  [phone]     Date of referral: 08/09/23 Referring provider: Maude Graves MD Referring diagnosis? Bilat knee OA Treatment diagnosis? (if different than referring diagnosis) no  What was this (referring dx) caused by? Arthritis  Nature of Condition: Chronic (continuous  duration > 3 months)   Laterality: Both  Current Functional Measure Score: LEFS 21/80  Objective measurements identify impairments when they are compared to normal values, the uninvolved extremity, and prior level of function.  [x]  Yes  []  No  Objective assessment of functional ability: Severe functional limitations   Briefly describe symptoms: Bilat knee pain, flexion contractures, gait abnormality, strength deficits  How did symptoms start: OA  Average pain intensity:  Last 24 hours: 3/10  Past week:  3/10  How often does the pt experience symptoms? intermittent  How much have the symptoms interfered with usual daily activities? Quite a bit  How has condition changed since care began at this facility? Better  In general, how is the patients overall health? Good

## 2023-11-12 ENCOUNTER — Ambulatory Visit (HOSPITAL_BASED_OUTPATIENT_CLINIC_OR_DEPARTMENT_OTHER): Admitting: Physical Therapy

## 2023-11-12 ENCOUNTER — Encounter (HOSPITAL_BASED_OUTPATIENT_CLINIC_OR_DEPARTMENT_OTHER): Payer: Self-pay | Admitting: Physical Therapy

## 2023-11-12 DIAGNOSIS — R2689 Other abnormalities of gait and mobility: Secondary | ICD-10-CM

## 2023-11-12 DIAGNOSIS — M25562 Pain in left knee: Secondary | ICD-10-CM | POA: Diagnosis not present

## 2023-11-12 DIAGNOSIS — G8929 Other chronic pain: Secondary | ICD-10-CM | POA: Diagnosis not present

## 2023-11-12 DIAGNOSIS — M6281 Muscle weakness (generalized): Secondary | ICD-10-CM

## 2023-11-12 DIAGNOSIS — R293 Abnormal posture: Secondary | ICD-10-CM | POA: Diagnosis not present

## 2023-11-12 DIAGNOSIS — M25561 Pain in right knee: Secondary | ICD-10-CM | POA: Diagnosis not present

## 2023-11-12 NOTE — Therapy (Signed)
 OUTPATIENT PHYSICAL THERAPY LOWER EXTREMITY TREATMENT Patient Name: [name] Wright MRN: [id number] DOB:1965/11/01, 58 y.o., female Today's Date: 11/12/2023  END OF SESSION:  PT End of Session - 11/12/23 1708     Visit Number 17    Date for PT Re-Evaluation 01/03/24    Authorization Type [employer] MCR    Progress Note Due on Visit 26    PT Start Time 1700    PT Stop Time 1740    PT Time Calculation (min) 40 min    Activity Tolerance Patient tolerated treatment well    Behavior During Therapy WFL for tasks assessed/performed             Past Medical History:  Diagnosis Date   Acute cystitis    Allergic rhinitis    Childhood asthma    no problems as adult - no inhaler   Chronic insomnia    Chronic pain due to trauma 02/2001   closed head trauma - Followed by Dr Elspeth Mems [facility]   Complication of anesthesia    Dyspepsia    Family history of ovarian cancer    Head trauma 02/24/2001   closed   History of kidney stones    passed stone - no surgery required   Hypertension    Hypothyroidism    PONV (postoperative nausea and vomiting)    Pre-diabetes    Sleep apnea    Pt denies   SVD (spontaneous vaginal delivery)    fetal demise at 5 months   Past Surgical History:  Procedure Laterality Date   APPENDECTOMY     BIOPSY  08/25/2020   Procedure: BIOPSY;  Surgeon: San Sandor GAILS, DO;  Location: [facility];  Service: Gastroenterology;;   COLONOSCOPY WITH PROPOFOL  N/A 08/25/2020   Procedure: COLONOSCOPY WITH PROPOFOL ;  Surgeon: San Sandor GAILS, DO;  Location: [facility];  Service: Gastroenterology;  Laterality: N/A;   DILATATION & CURETTAGE/HYSTEROSCOPY WITH MYOSURE N/A 04/15/2017   Procedure: DILATATION & CURETTAGE/HYSTEROSCOPY WITH MYOSURE POLYPECTOMY;  Surgeon: Curlene Agent, MD;  Location: [facility];  Service: Gynecology;  Laterality: N/A;   DILATATION & CURETTAGE/HYSTEROSCOPY WITH MYOSURE N/A 06/18/2019   Procedure: DILATATION &  CURETTAGE/HYSTEROSCOPY WITH  MYOSURE;  Surgeon: Curlene Agent, MD;  Location: [facility];  Service: Gynecology;  Laterality: N/A;   DILATATION & CURETTAGE/HYSTEROSCOPY WITH MYOSURE N/A 06/07/2023   Procedure: DILATATION & CURETTAGE/HYSTEROSCOPY WITH  MYOSURE;  Surgeon: Curlene Agent, MD;  Location: [facility] OR;  Service: Gynecology;  Laterality: N/A;  POSSIBLE MYOSURE [facility]   ESOPHAGOGASTRODUODENOSCOPY (EGD) WITH PROPOFOL  N/A 08/25/2020   Procedure: ESOPHAGOGASTRODUODENOSCOPY (EGD) WITH PROPOFOL ;  Surgeon: San Sandor GAILS, DO;  Location: [facility];  Service: Gastroenterology;  Laterality: N/A;   EYE SURGERY     cataracts removed   INCONTINENCE SURGERY     PILONIDAL CYST EXCISION     x2   POLYPECTOMY  08/25/2020   Procedure: POLYPECTOMY;  Surgeon: San Sandor GAILS, DO;  Location: [facility];  Service: Gastroenterology;;   repair of toe laceration     dermabond to right big toe right foot   repair of torn ligament right leg     patient denies this surgery   right foot fracture     cast only   TONSILLECTOMY     WISDOM TOOTH EXTRACTION     Patient Active Problem List   Diagnosis Date Noted   Urgency of urination 09/26/2023   Vertigo 08/19/2023   Swimmer's ear 08/19/2023   Rash and nonspecific skin eruption 08/19/2023   Chronic  pain of both knees 08/17/2023   Thrush, oral 07/07/2023   Female climacteric state 05/14/2023   Pre-op exam 05/14/2023   Class 3 severe obesity due to excess calories with body mass index (BMI) of 50.0 to 59.9 in adult 03/25/2023   Primary hypothyroidism 03/25/2023   Otalgia, right ear 03/25/2023   Vitamin D  deficiency 02/06/2023   Vitamin B12 deficiency 02/06/2023   Urinary frequency 05/27/2022   Leukocytes in urine 05/27/2022   Costochondritis 05/14/2022   Genetic testing 03/08/2021   Family history of ovarian cancer 02/16/2021   Uncontrolled type 2 diabetes mellitus with hyperglycemia (HCC) 02/07/2021   Essential hypertension, benign 02/07/2021    Gastritis and gastroduodenitis    Nausea without vomiting    Adenomatous polyp of ascending colon    Adenomatous polyp of transverse colon    Adenomatous polyp of sigmoid colon    Rectal polyp    Adenomatous polyp of descending colon    Gall bladder pain 11/04/2019   Class 3 severe obesity due to excess calories with serious comorbidity and body mass index (BMI) of 60.0 to 69.9 in adult 08/13/2019   Nasal congestion 03/10/2019   Sinus pressure 03/10/2019   Hyperglycemia 01/22/2019   Hypertriglyceridemia 01/22/2019   Combined forms of age-related cataract of left eye 09/10/2017   Musculoskeletal neck pain 07/24/2017   Migraine without aura and without status migrainosus, not intractable 11/12/2016   Encounter for preventative adult health care exam with abnormal findings 09/01/2015   Sinusitis, chronic 09/01/2015   Cough 09/01/2015   Morbid obesity with BMI of 50.0-59.9, adult (HCC) 11/18/2014   RUQ pain 10/27/2014   Urinary incontinence 09/17/2013   Acute sinus infection 10/16/2012   Paresthesias 09/22/2012   Sleep apnea, obstructive 09/22/2012   Elevated BP 07/29/2012   Fatigue 07/10/2012   Anxiety 06/02/2012   Closed head injury 06/02/2012   Depression 06/02/2012   Facial pain 06/02/2012   Leg cramps 04/15/2011   Edema 12/21/2010   Chronic pain due to trauma 03/15/2007   Extrinsic asthma 03/15/2007   INSOMNIA, CHRONIC 11/01/2006   Allergic rhinitis 11/01/2006   DYSPEPSIA, CHRONIC 11/01/2006   Head injury 11/01/2006    PCP: Catheryn Slocumb MD  REFERRING PROVIDER:   Sheril Coy, MD    REFERRING DIAG: M17.0 (ICD-10-CM) - Degenerative arthritis of knee, bilateral   THERAPY DIAG:  Bilateral chronic knee pain  Muscle weakness (generalized)  Abnormal posture  Other abnormalities of gait and mobility  Rationale for Evaluation and Treatment: Rehabilitation  ONSET DATE: 1 year  SUBJECTIVE:   SUBJECTIVE STATEMENT: Pt reports she slipped in bathroom and fell  hitting her buttocks on toilet yesterday; sore today.   Took medicine prior to session.  Pt reports she still is challenged by the journey from pool to car after being in the water.    POOL ACCESS:  Considering membership here at [facility] well for pool access.   eval: Want to avoid having TKR.  Bone spurs are on the back of my knee caps.  Twisted my knee about 1 years ago.  Have been using rollator on and off for past 6 months. Can't straighten my knee. Gel shots have helped.  Just had injections last week.  Have a few more scheduled.  Had a surgery in last few months and I have been somewhat immobile not moving much at all.. Sleep for about 4 hours before waking  PERTINENT HISTORY: TBI 2008/2014 PAIN:  Are you having pain? Yes: NPRS scale: current 2/10 Pain location: bilat knee  joint, buttocks, thighs Pain description: ache Aggravating factors: weight bearing Relieving factors: lidocaine  patches, OTC meds, mag cream  PRECAUTIONS: None  RED FLAGS: None   WEIGHT BEARING RESTRICTIONS: No  FALLS:  Has patient fallen in last 6 months? No tripped walking into the house  LIVING ENVIRONMENT: Lives with: lives with their family Lives in: House/apartment Stairs: 6 step into home Has following equipment at home: Single point cane and rollator and electric scooterlift chair, shower bench  OCCUPATION: disables  PLOF: Independent uses shower benches  PATIENT GOALS: build stamina, increase strength  NEXT MD VISIT: Friday  OBJECTIVE:  Note: Objective measures were completed at Evaluation unless otherwise noted.  DIAGNOSTIC FINDINGS: none in chart  PATIENT SURVEYS:  LEFS 12/80  COGNITION: Overall cognitive status: Within functional limits for tasks assessed     SENSATION: WFL   MUSCLE LENGTH: Hamstrings:shortened bilaterally   POSTURE: rounded shoulders, forward head, and knee flexed trunk forward flexed.  PALPATION: Moderate TTP about knees bilat joint lines medial>  Lateral  LOWER EXTREMITY ROM:  Active ROM Right eval Left eval R / L 11/08/23  Hip flexion     Hip extension     Hip abduction     Hip adduction     Hip internal rotation     Hip external rotation     Knee flexion 100d 103 98 / 100  Knee extension -40 -34 -38 / -30  Ankle dorsiflexion   Wfl / wfl  Ankle plantarflexion     Ankle inversion     Ankle eversion      (Blank rows = not tested)  LOWER EXTREMITY MMT:  HD (lb) Tested in sitting Right eval Left eval R / L 11/08/23  Hip flexion 44.4 45.9   Hip extension     Hip abduction 27.1 25.9   Hip adduction     Hip internal rotation     Hip external rotation     Knee flexion     Knee extension 33.1 25.6 32.4 / 35.2  Ankle dorsiflexion     Ankle plantarflexion     Ankle inversion     Ankle eversion      (Blank rows = not tested)   FUNCTIONAL TESTS:  Timed up and go (TUG): 23.91 STS transfers: heavy ue support from pool bench; forward momentum with ue elevated   GAIT: Distance walked: 100 ft Assistive device utilized: rollator Level of assistance: SBA Comments: bilateral flex contractures: no heel strike, sliding on toes when advancing LE during swing, forward flex trunk posture   11/08/23: 400 ft x 2  using rollator   Forward hip flex, knee flex forearms intermittently on handles, continues to slide toes acroos floor to advance le but is able to clear foot 1/3 of time with cues  TREATMENT  [facility] Adult PT Treatment:                                                DATE: 11/12/23 Pt seen for aquatic therapy today.  Treatment took place in water 3.5-4.75 ft in depth at the [facility] pool. Temp of water was 91.  Pt entered and exited the pool via stairs using step-to pattern with heavy UE on hand rail.  * Yellow hand floats under water:  walking forward/backward - multiple laps, cues for  heel/toe and toe/ heel *side stepping yellow hand floats and add/abdct-> into wide squat  * forward walking kick * hands on bench in water:  plank with alternating hip extension x 10-> hip abdct/add x 10 * seated on bench: alternating LAQ with DF x 10;  clams x 10 * UE on wall: straight LE hip circles CW/CCW x 10-15 each * forward lunge with foot on 2nd step for knee ROM and hip flexor stretch -> heel on 2nd step for hamstring stretch  * bil heels off of step for gastroc stretch  * L stretch with UE on rails  * R/L quad stretch with foot on 2nd step behind her x 20s each x 2 reps    PATIENT EDUCATION:  Education details: aquatic therapy progression/ modification Person educated: Patient Education method: Explanation Education comprehension: verbalized understanding  HOME EXERCISE PROGRAM: AQUATIC Access Code: [email or web address] URL: [email or web address] Date: 11/04/2023 Prepared by: [facility] This aquatic home exercise program from [employer] utilizes pictures from land based exercises, but has been adapted prior to lamination and issuance.     ASSESSMENT:  CLINICAL IMPRESSION:  Pt tolerated all exercises well, with reported less discomfort in buttocks and decreased stiffness in LEs.  If time allows, land based therapist please get ROM measurements of bilat ankle DF and PF as ankle range is limited but difficult to measure in setting. Pt engages in aquatic therapy with very good toleration and good knowledge of exercises.  She is able to use the properties of water to improve movement, decrease ms spasm, improve balance and gait and reduce pain. LTG added. Long term Goals ongoing.      Eval: Patient is a 58 y.o. f who was seen today for physical therapy evaluation and treatment for bilat knee OA. She presents today with husband assisting she is using a rollator.  May require wc assistance for therapy sessions.  Exam reveals significant  bilateral knee flex contractures, limited knee flex, strength deficits, balance and proprioception deficits. Her gait is greatly impeded as she is unable to strike her heels and doesn't clear her feet from the floor/shuffles. She is a high fall risk due to posture and gait deficits.  She will benefit from skilled physical therapy both aquatic and land.  Will plan on focusing initially in aquatics on pain management, movement toleration improving ROM and strengthening then progress fairly quickly to alternating with land. Goals to improve all deficits which are significantly affecting her functional mobility and ADL's  OBJECTIVE IMPAIRMENTS: Abnormal gait, decreased activity tolerance, decreased balance, decreased coordination, decreased endurance, decreased knowledge of condition, decreased knowledge of use of DME, decreased mobility, difficulty walking, decreased ROM, decreased strength, impaired flexibility, postural dysfunction, obesity, and pain.   ACTIVITY LIMITATIONS: carrying, lifting, bending, sitting, standing, squatting, sleeping, stairs, transfers, hygiene/grooming, and locomotion  level  PARTICIPATION LIMITATIONS: meal prep, cleaning, laundry, driving, shopping, community activity, occupation, and yard work  PERSONAL FACTORS: Behavior pattern, Fitness, Past/current experiences, and Time since onset of injury/illness/exacerbation are also affecting patient's functional outcome.   REHAB POTENTIAL: Good  CLINICAL DECISION MAKING: Evolving/moderate complexity  EVALUATION COMPLEXITY: Moderate   GOALS: Goals reviewed with patient? Yes  SHORT TERM GOALS: Target date: 10/11/23 Pt will tolerate full aquatic sessions consistently without increase in pain and with improving function to demonstrate good toleration and effectiveness of intervention.  Baseline: Goal status:Met 10/14/23  2.  Pt will tolerate walking to and from setting and engaging in aquatic therapy session without excessive  fatigue or increase in pain to demonstrate improved toleration to activity. Baseline: unable at eval Goal status: Met - 11/04/23  3.  Pt will be indep with stair negotiation into and out of pool consistently using hand rail Baseline:  Goal status: Met 10/14/23    4. Pt to verbalize understanding of the importance of exercise/movement on overall well being.    Baseline: immobile for up towards 1 year    Goal status:Met 10/14/23   LONG TERM GOALS: Target date: 11/08/23  Pt to improve on LEFS by at least 9 point to demonstrate statistically significant Improvement in function. Baseline: 12/80; 21/80 Goal status: Met 11/08/23  2.  Pt will report decrease in pain by at least 50% for improved toleration to activity/quality of life and to demonstrate improved management of pain. Baseline:  Goal status: In progress 11/08/23  3.  Pt will improve strength in knee extension by at least 10 lbs to demonstrate improved overall physical function Baseline:  Goal status: in Progress 11/08/23  4.  Pt to improve bilateral knee extension by 50% for improved gait. Baseline:  Goal status: In progress 11/08/23    5. Pt will be indep with final HEP's (land and aquatic as appropriate) for continued management of condition    Baseline:    Goal status: In progress -11/04/23   6. Pt will improve bilateral ankle DF to 10d to improve heel strike with gait.   Baseline:<neutral   Goal Status: New    7.Pt will improve standing posture as demonstrated by standing with rollator and walking with hands on bars at all times   Baseline: 50-75% of time leaning on forearms.   Goal Status: New   8. Pt will amb 1000 ft using ad in upright position striking heels 50% of time.   Baseline: 400-500 ft leaning on forearms at least 50% of time; heel strike  PLAN:  PT FREQUENCY: 2x/week  PT DURATION: 8 weeks  PLANNED INTERVENTIONS: 97164- PT Re-evaluation, 97110-Therapeutic exercises, 97530- Therapeutic activity, [id number]-  Neuromuscular re-education, 97535- Self Care, [id number]- Manual therapy, [id number]- Gait training, [phone]- Orthotic Initial, [phone]- Aquatic Therapy, [phone]- Ionotophoresis 4mg /ml Dexamethasone , Patient/Family education, Balance training, Stair training, Taping, Dry Needling, Joint mobilization, DME instructions, Cryotherapy, and Moist heat  PLAN FOR NEXT SESSION: alternate land/aquatics for Knee ROM/strengthening; gait; balance an proprioception; Review HEP  Delon Aquas, PTA 11/12/23 5:46 PM [facility] [address] [place], [place], [address] Phone: [phone]   Fax:  [phone]   Date of referral: 08/09/23 Referring provider: Maude Graves MD Referring diagnosis? Bilat knee OA Treatment diagnosis? (if different than referring diagnosis) no  What was this (referring dx) caused by? Arthritis  Nature of Condition: Chronic (continuous duration > 3 months)   Laterality: Both  Current Functional Measure Score: LEFS 21/80  Objective measurements identify impairments  when they are compared to normal values, the uninvolved extremity, and prior level of function.  [x]  Yes  []  No  Objective assessment of functional ability: Severe functional limitations   Briefly describe symptoms: Bilat knee pain, flexion contractures, gait abnormality, strength deficits  How did symptoms start: OA  Average pain intensity:  Last 24 hours: 3/10  Past week: 3/10  How often does the pt experience symptoms? intermittent  How much have the symptoms interfered with usual daily activities? Quite a bit  How has condition changed since care began at this facility? Better  In general, how is the patients overall health? Good

## 2023-11-18 ENCOUNTER — Ambulatory Visit (HOSPITAL_BASED_OUTPATIENT_CLINIC_OR_DEPARTMENT_OTHER): Admitting: Physical Therapy

## 2023-11-18 ENCOUNTER — Encounter (HOSPITAL_BASED_OUTPATIENT_CLINIC_OR_DEPARTMENT_OTHER): Payer: Self-pay | Admitting: Physical Therapy

## 2023-11-18 DIAGNOSIS — R2689 Other abnormalities of gait and mobility: Secondary | ICD-10-CM | POA: Diagnosis not present

## 2023-11-18 DIAGNOSIS — G8929 Other chronic pain: Secondary | ICD-10-CM

## 2023-11-18 DIAGNOSIS — M25561 Pain in right knee: Secondary | ICD-10-CM | POA: Diagnosis not present

## 2023-11-18 DIAGNOSIS — R293 Abnormal posture: Secondary | ICD-10-CM

## 2023-11-18 DIAGNOSIS — M6281 Muscle weakness (generalized): Secondary | ICD-10-CM | POA: Diagnosis not present

## 2023-11-18 DIAGNOSIS — M25562 Pain in left knee: Secondary | ICD-10-CM | POA: Diagnosis not present

## 2023-11-18 NOTE — Therapy (Signed)
 OUTPATIENT PHYSICAL THERAPY LOWER EXTREMITY TREATMENT Patient Name: [name] Wright MRN: [id number] DOB:1965-09-27, 58 y.o., female Today's Date: 11/18/2023  END OF SESSION:  PT End of Session - 11/18/23 1026     Visit Number 18    Date for PT Re-Evaluation 01/03/24    Authorization Type [employer] MCR    Authorization Time Period 8/1-8/29/25    Authorization - Visit Number 3    Authorization - Number of Visits 4    Progress Note Due on Visit 26    PT Start Time 1015    PT Stop Time 1053    PT Time Calculation (min) 38 min    Activity Tolerance Patient tolerated treatment well    Behavior During Therapy WFL for tasks assessed/performed             Past Medical History:  Diagnosis Date   Acute cystitis    Allergic rhinitis    Childhood asthma    no problems as adult - no inhaler   Chronic insomnia    Chronic pain due to trauma 02/2001   closed head trauma - Followed by Dr Elspeth Mems [facility]   Complication of anesthesia    Dyspepsia    Family history of ovarian cancer    Head trauma 02/24/2001   closed   History of kidney stones    passed stone - no surgery required   Hypertension    Hypothyroidism    PONV (postoperative nausea and vomiting)    Pre-diabetes    Sleep apnea    Pt denies   SVD (spontaneous vaginal delivery)    fetal demise at 5 months   Past Surgical History:  Procedure Laterality Date   APPENDECTOMY     BIOPSY  08/25/2020   Procedure: BIOPSY;  Surgeon: San Sandor GAILS, DO;  Location: [facility];  Service: Gastroenterology;;   COLONOSCOPY WITH PROPOFOL  N/A 08/25/2020   Procedure: COLONOSCOPY WITH PROPOFOL ;  Surgeon: San Sandor GAILS, DO;  Location: [facility];  Service: Gastroenterology;  Laterality: N/A;   DILATATION & CURETTAGE/HYSTEROSCOPY WITH MYOSURE N/A 04/15/2017   Procedure: DILATATION & CURETTAGE/HYSTEROSCOPY WITH MYOSURE POLYPECTOMY;  Surgeon: Curlene Agent, MD;  Location: [facility];  Service: Gynecology;   Laterality: N/A;   DILATATION & CURETTAGE/HYSTEROSCOPY WITH MYOSURE N/A 06/18/2019   Procedure: DILATATION & CURETTAGE/HYSTEROSCOPY WITH  MYOSURE;  Surgeon: Curlene Agent, MD;  Location: [facility];  Service: Gynecology;  Laterality: N/A;   DILATATION & CURETTAGE/HYSTEROSCOPY WITH MYOSURE N/A 06/07/2023   Procedure: DILATATION & CURETTAGE/HYSTEROSCOPY WITH  MYOSURE;  Surgeon: Curlene Agent, MD;  Location: [facility];  Service: Gynecology;  Laterality: N/A;  POSSIBLE MYOSURE [facility]   ESOPHAGOGASTRODUODENOSCOPY (EGD) WITH PROPOFOL  N/A 08/25/2020   Procedure: ESOPHAGOGASTRODUODENOSCOPY (EGD) WITH PROPOFOL ;  Surgeon: San Sandor GAILS, DO;  Location: [facility];  Service: Gastroenterology;  Laterality: N/A;   EYE SURGERY     cataracts removed   INCONTINENCE SURGERY     PILONIDAL CYST EXCISION     x2   POLYPECTOMY  08/25/2020   Procedure: POLYPECTOMY;  Surgeon: San Sandor GAILS, DO;  Location: [facility];  Service: Gastroenterology;;   repair of toe laceration     dermabond to right big toe right foot   repair of torn ligament right leg     patient denies this surgery   right foot fracture     cast only   TONSILLECTOMY     WISDOM TOOTH EXTRACTION     Patient Active Problem List   Diagnosis Date Noted  Urgency of urination 09/26/2023   Vertigo 08/19/2023   Swimmer's ear 08/19/2023   Rash and nonspecific skin eruption 08/19/2023   Chronic pain of both knees 08/17/2023   Thrush, oral 07/07/2023   Female climacteric state 05/14/2023   Pre-op exam 05/14/2023   Class 3 severe obesity due to excess calories with body mass index (BMI) of 50.0 to 59.9 in adult 03/25/2023   Primary hypothyroidism 03/25/2023   Otalgia, right ear 03/25/2023   Vitamin D  deficiency 02/06/2023   Vitamin B12 deficiency 02/06/2023   Urinary frequency 05/27/2022   Leukocytes in urine 05/27/2022   Costochondritis 05/14/2022   Genetic testing 03/08/2021   Family history of ovarian cancer 02/16/2021   Uncontrolled type 2  diabetes mellitus with hyperglycemia (HCC) 02/07/2021   Essential hypertension, benign 02/07/2021   Gastritis and gastroduodenitis    Nausea without vomiting    Adenomatous polyp of ascending colon    Adenomatous polyp of transverse colon    Adenomatous polyp of sigmoid colon    Rectal polyp    Adenomatous polyp of descending colon    Gall bladder pain 11/04/2019   Class 3 severe obesity due to excess calories with serious comorbidity and body mass index (BMI) of 60.0 to 69.9 in adult 08/13/2019   Nasal congestion 03/10/2019   Sinus pressure 03/10/2019   Hyperglycemia 01/22/2019   Hypertriglyceridemia 01/22/2019   Combined forms of age-related cataract of left eye 09/10/2017   Musculoskeletal neck pain 07/24/2017   Migraine without aura and without status migrainosus, not intractable 11/12/2016   Encounter for preventative adult health care exam with abnormal findings 09/01/2015   Sinusitis, chronic 09/01/2015   Cough 09/01/2015   Morbid obesity with BMI of 50.0-59.9, adult (HCC) 11/18/2014   RUQ pain 10/27/2014   Urinary incontinence 09/17/2013   Acute sinus infection 10/16/2012   Paresthesias 09/22/2012   Sleep apnea, obstructive 09/22/2012   Elevated BP 07/29/2012   Fatigue 07/10/2012   Anxiety 06/02/2012   Closed head injury 06/02/2012   Depression 06/02/2012   Facial pain 06/02/2012   Leg cramps 04/15/2011   Edema 12/21/2010   Chronic pain due to trauma 03/15/2007   Extrinsic asthma 03/15/2007   INSOMNIA, CHRONIC 11/01/2006   Allergic rhinitis 11/01/2006   DYSPEPSIA, CHRONIC 11/01/2006   Head injury 11/01/2006    PCP: Catheryn Slocumb MD  REFERRING PROVIDER:   Sheril Coy, MD    REFERRING DIAG: M17.0 (ICD-10-CM) - Degenerative arthritis of knee, bilateral   THERAPY DIAG:  Bilateral chronic knee pain  Muscle weakness (generalized)  Abnormal posture  Other abnormalities of gait and mobility  Rationale for Evaluation and Treatment:  Rehabilitation  ONSET DATE: 1 year  SUBJECTIVE:   SUBJECTIVE STATEMENT: Pt reports she slept on her stomach for 20 min last night- first time since March of last year. This is monumental!    POOL ACCESS:  Considering membership here at [facility] for pool access.   eval: Want to avoid having TKR.  Bone spurs are on the back of my knee caps.  Twisted my knee about 1 years ago.  Have been using rollator on and off for past 6 months. Can't straighten my knee. Gel shots have helped.  Just had injections last week.  Have a few more scheduled.  Had a surgery in last few months and I have been somewhat immobile not moving much at all.. Sleep for about 4 hours before waking  PERTINENT HISTORY: TBI 2008/2014 PAIN:  Are you having pain? Yes: NPRS scale: current 3-4/10  Pain location: bilat knee joint, hips Pain description: ache Aggravating factors: weight bearing Relieving factors: lidocaine  patches, OTC meds, mag cream  PRECAUTIONS: None  RED FLAGS: None   WEIGHT BEARING RESTRICTIONS: No  FALLS:  Has patient fallen in last 6 months? No tripped walking into the house  LIVING ENVIRONMENT: Lives with: lives with their family Lives in: House/apartment Stairs: 6 step into home Has following equipment at home: Single point cane and rollator and electric scooterlift chair, shower bench  OCCUPATION: disables  PLOF: Independent uses shower benches  PATIENT GOALS: build stamina, increase strength  NEXT MD VISIT: Friday  OBJECTIVE:  Note: Objective measures were completed at Evaluation unless otherwise noted.  DIAGNOSTIC FINDINGS: none in chart  PATIENT SURVEYS:  LEFS 12/80  COGNITION: Overall cognitive status: Within functional limits for tasks assessed     SENSATION: WFL   MUSCLE LENGTH: Hamstrings:shortened bilaterally   POSTURE: rounded shoulders, forward head, and knee flexed trunk forward flexed.  PALPATION: Moderate TTP about knees bilat joint lines medial>  Lateral  LOWER EXTREMITY ROM:  Active ROM Right eval Left eval R / L 11/08/23  Hip flexion     Hip extension     Hip abduction     Hip adduction     Hip internal rotation     Hip external rotation     Knee flexion 100d 103 98 / 100  Knee extension -40 -34 -38 / -30  Ankle dorsiflexion   Wfl / wfl  Ankle plantarflexion     Ankle inversion     Ankle eversion      (Blank rows = not tested)  LOWER EXTREMITY MMT:  HD (lb) Tested in sitting Right eval Left eval R / L 11/08/23  Hip flexion 44.4 45.9   Hip extension     Hip abduction 27.1 25.9   Hip adduction     Hip internal rotation     Hip external rotation     Knee flexion     Knee extension 33.1 25.6 32.4 / 35.2  Ankle dorsiflexion     Ankle plantarflexion     Ankle inversion     Ankle eversion      (Blank rows = not tested)   FUNCTIONAL TESTS:  Timed up and go (TUG): 23.91 STS transfers: heavy ue support from pool bench; forward momentum with ue elevated   GAIT: Distance walked: 100 ft Assistive device utilized: rollator Level of assistance: SBA Comments: bilateral flex contractures: no heel strike, sliding on toes when advancing LE during swing, forward flex trunk posture   11/08/23: 400 ft x 2  using rollator   Forward hip flex, knee flex forearms intermittently on handles, continues to slide toes acroos floor to advance le but is able to clear foot 1/3 of time with cues  TREATMENT  [facility] Adult PT Treatment:                                                DATE: 11/18/23 Pt seen for aquatic therapy today.  Treatment took place in water 3.5-4.75 ft in depth at the [facility] pool. Temp of water was 91.  Pt entered and exited the pool via stairs using step-to pattern with heavy UE on hand rail.  * Yellow hand floats under water:  walking forward/backward - multiple laps, cues for  heel/toe and toe/ heel (does better facing lap pool) *side stepping yellow hand floats and add/abdct-> into wide squat  * forward walking kick * UE On  * hands on bench in water:  plank with alternating hip extension x 10-> hip abdct/add x 10 * seated on bench: alternating LAQ with DF x 10;  clams x 10 * bil heels off of step for gastroc stretch  * L stretch with UE on rails  * forward step up/retro step down x 5 reps each LE; cues to not circumduct LE onto step * R/L quad stretch with foot on 2nd step behind her x 20s each x 2 reps    PATIENT EDUCATION:  Education details: aquatic therapy progression/ modification Person educated: Patient Education method: Explanation Education comprehension: verbalized understanding  HOME EXERCISE PROGRAM: AQUATIC Access Code: [email or web address] URL: [email or web address] Date: 11/04/2023 Prepared by: [facility] [address] This aquatic home exercise program from [employer] utilizes pictures from land based exercises, but has been adapted prior to lamination and issuance.     ASSESSMENT:  CLINICAL IMPRESSION:  Pt tolerated all exercises well, with reported less discomfort in bil knees, hips and ankles.  Land based therapist to take ROM measurements of bilat ankle DF and PF, as ankle range is limited but difficult to measure in aquatic setting. Pt is able to use the properties of water to improve movement, decrease muscle spasms, improve balance, improve gait quality, and reduce pain. Strongly encouraged pt to gain access to pool to begin working independently with HEP. Pt gradually progressing towards remaining goals.      Eval: Patient is a 57 y.o. f who was seen today for physical therapy evaluation and treatment for bilat knee OA. She presents today with husband assisting she is using a rollator.  May require wc assistance for therapy sessions.  Exam reveals significant bilateral knee flex contractures, limited knee  flex, strength deficits, balance and proprioception deficits. Her gait is greatly impeded as she is unable to strike her heels and doesn't clear her feet from the floor/shuffles. She is a high fall risk due to posture and gait deficits.  She will benefit from skilled physical therapy both aquatic and land.  Will plan on focusing initially in aquatics on pain management, movement toleration improving ROM and strengthening then progress fairly quickly to alternating with land. Goals to improve all deficits which are significantly affecting her functional mobility and ADL's  OBJECTIVE IMPAIRMENTS: Abnormal gait, decreased activity tolerance, decreased balance, decreased coordination, decreased endurance, decreased knowledge of condition, decreased knowledge of use of DME, decreased mobility, difficulty walking, decreased ROM, decreased strength, impaired flexibility, postural dysfunction, obesity, and pain.   ACTIVITY LIMITATIONS: carrying, lifting, bending, sitting, standing, squatting, sleeping, stairs, transfers, hygiene/grooming, and locomotion level  PARTICIPATION LIMITATIONS: meal prep, cleaning, laundry, driving, shopping, community activity,  occupation, and yard work  PERSONAL FACTORS: Behavior pattern, Fitness, Past/current experiences, and Time since onset of injury/illness/exacerbation are also affecting patient's functional outcome.   REHAB POTENTIAL: Good  CLINICAL DECISION MAKING: Evolving/moderate complexity  EVALUATION COMPLEXITY: Moderate   GOALS: Goals reviewed with patient? Yes  SHORT TERM GOALS: Target date: 10/11/23 Pt will tolerate full aquatic sessions consistently without increase in pain and with improving function to demonstrate good toleration and effectiveness of intervention.  Baseline: Goal status:Met 10/14/23  2.  Pt will tolerate walking to and from setting and engaging in aquatic therapy session without excessive fatigue or increase in pain to demonstrate improved  toleration to activity. Baseline: unable at eval Goal status: Met - 11/04/23  3.  Pt will be indep with stair negotiation into and out of pool consistently using hand rail Baseline:  Goal status: Met 10/14/23    4. Pt to verbalize understanding of the importance of exercise/movement on overall well being.    Baseline: immobile for up towards 1 year    Goal status:Met 10/14/23   LONG TERM GOALS: Target date: 11/08/23  Pt to improve on LEFS by at least 9 point to demonstrate statistically significant Improvement in function. Baseline: 12/80; 21/80 Goal status: Met 11/08/23  2.  Pt will report decrease in pain by at least 50% for improved toleration to activity/quality of life and to demonstrate improved management of pain. Baseline:  Goal status: In progress 11/08/23  3.  Pt will improve strength in knee extension by at least 10 lbs to demonstrate improved overall physical function Baseline:  Goal status: in Progress 11/08/23  4.  Pt to improve bilateral knee extension by 50% for improved gait. Baseline:  Goal status: In progress 11/08/23    5. Pt will be indep with final HEP's (land and aquatic as appropriate) for continued management of condition    Baseline:    Goal status: In progress -11/04/23   6. Pt will improve bilateral ankle DF to 10d to improve heel strike with gait.   Baseline:<neutral   Goal Status: New    7.Pt will improve standing posture as demonstrated by standing with rollator and walking with hands on bars at all times   Baseline: 50-75% of time leaning on forearms.   Goal Status: New   8. Pt will amb 1000 ft using ad in upright position striking heels 50% of time.   Baseline: 400-500 ft leaning on forearms at least 50% of time; heel strike  PLAN:  PT FREQUENCY: 2x/week  PT DURATION: 8 weeks  PLANNED INTERVENTIONS: 97164- PT Re-evaluation, 97110-Therapeutic exercises, 97530- Therapeutic activity, [id number]- Neuromuscular re-education, 97535- Self Care, [id number]- Manual  therapy, [id number]- Gait training, [phone]- Orthotic Initial, [phone]- Aquatic Therapy, [phone]- Ionotophoresis 4mg /ml Dexamethasone , Patient/Family education, Balance training, Stair training, Taping, Dry Needling, Joint mobilization, DME instructions, Cryotherapy, and Moist heat  PLAN FOR NEXT SESSION: alternate land/aquatics for Knee ROM/strengthening; gait; balance an proprioception; Review HEP Delon Aquas, PTA 11/18/23 11:01 AM [facility] [address] [place], [place], [address] Phone: [phone]   Fax:  [phone]    Date of referral: 08/09/23 Referring provider: Maude Graves MD Referring diagnosis? Bilat knee OA Treatment diagnosis? (if different than referring diagnosis) no  What was this (referring dx) caused by? Arthritis  Nature of Condition: Chronic (continuous duration > 3 months)   Laterality: Both  Current Functional Measure Score: LEFS 21/80  Objective measurements identify impairments when they are compared to normal values, the uninvolved extremity, and prior  level of function.  [x]  Yes  []  No  Objective assessment of functional ability: Severe functional limitations   Briefly describe symptoms: Bilat knee pain, flexion contractures, gait abnormality, strength deficits  How did symptoms start: OA  Average pain intensity:  Last 24 hours: 3/10  Past week: 3/10  How often does the pt experience symptoms? intermittent  How much have the symptoms interfered with usual daily activities? Quite a bit  How has condition changed since care began at this facility? Better  In general, how is the patients overall health? Good

## 2023-11-20 ENCOUNTER — Encounter: Payer: Self-pay | Admitting: Internal Medicine

## 2023-11-20 ENCOUNTER — Ambulatory Visit (INDEPENDENT_AMBULATORY_CARE_PROVIDER_SITE_OTHER): Admitting: Internal Medicine

## 2023-11-20 ENCOUNTER — Encounter (HOSPITAL_BASED_OUTPATIENT_CLINIC_OR_DEPARTMENT_OTHER): Payer: Self-pay

## 2023-11-20 VITALS — BP 134/82 | HR 91 | Temp 98.0°F | Ht 66.0 in | Wt 346.6 lb

## 2023-11-20 DIAGNOSIS — E66813 Obesity, class 3: Secondary | ICD-10-CM

## 2023-11-20 DIAGNOSIS — R059 Cough, unspecified: Secondary | ICD-10-CM

## 2023-11-20 DIAGNOSIS — Z6841 Body Mass Index (BMI) 40.0 and over, adult: Secondary | ICD-10-CM

## 2023-11-20 DIAGNOSIS — K5909 Other constipation: Secondary | ICD-10-CM | POA: Diagnosis not present

## 2023-11-20 DIAGNOSIS — E1169 Type 2 diabetes mellitus with other specified complication: Secondary | ICD-10-CM | POA: Diagnosis not present

## 2023-11-20 DIAGNOSIS — I10 Essential (primary) hypertension: Secondary | ICD-10-CM

## 2023-11-20 DIAGNOSIS — M7989 Other specified soft tissue disorders: Secondary | ICD-10-CM

## 2023-11-20 DIAGNOSIS — E1165 Type 2 diabetes mellitus with hyperglycemia: Secondary | ICD-10-CM

## 2023-11-20 DIAGNOSIS — E785 Hyperlipidemia, unspecified: Secondary | ICD-10-CM

## 2023-11-20 DIAGNOSIS — E039 Hypothyroidism, unspecified: Secondary | ICD-10-CM

## 2023-11-20 MED ORDER — MOUNJARO 10 MG/0.5ML ~~LOC~~ SOAJ: 10.0000 mg | SUBCUTANEOUS | 2 refills | Status: DC

## 2023-11-20 MED ORDER — FUROSEMIDE 80 MG PO TABS: ORAL_TABLET | ORAL | 0 refills | Status: AC

## 2023-11-20 MED ORDER — ALBUTEROL SULFATE HFA 108 (90 BASE) MCG/ACT IN AERS: 2.0000 | INHALATION_SPRAY | RESPIRATORY_TRACT | 1 refills | Status: AC | PRN

## 2023-11-20 NOTE — Progress Notes (Signed)
 I,Victoria T Emmitt, CMA,acting as a Neurosurgeon for Catheryn LOISE Slocumb, MD.,have documented all relevant documentation on the behalf of Catheryn LOISE Slocumb, MD,as directed by  Catheryn LOISE Slocumb, MD while in the presence of Catheryn LOISE Slocumb, MD.  Subjective:  Patient ID: [name] Wright , female    DOB: 17-Aug-1965 , 58 y.o.   MRN: [id number]  Chief Complaint  Patient presents with   Hypertension    Patient presents today for bp, dm & thyroid  follow up. She reports compliance with medications. Denies headache, chest pain & sob.  She will soon schedule mammogram.    Diabetes   Hypothyroidism    HPI Discussed the use of AI scribe software for clinical note transcription with the patient, who gave verbal consent to proceed.  History of Present Illness [name] Wright is a 58 year old female who presents for a diabetes check.  She has been using a new Libre sensor for the past 30 days, connected to her new phone. 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  She uses lidocaine  patches on her knees for pain management and also uses Flector patches and Voltaren pills. Recently, she switched to a prescription for Voltaren cream as it is now more cost-effective through insurance. She notes that the cost of her patches has decreased recently.  She experiences allergy symptoms, particularly during 'tree mode' due [place]. She uses Benadryl  and Flonase to manage her symptoms, noting that Flonase helps prevent sinus infections but not ear infections. She took half a Benadryl  on the way to the appointment and has an albuterol  inhaler at home.  She is taking a stool softener with a probiotic to manage bowel movements, which she describes as 'a little tricky' and slowing down. She is also on Lasix  and takes a thyroid  medication at a dose of  100 mcg every morning.  She has been participating in swim therapy and is due for re-evaluation next week. She is on a waitlist for a cardiology appointment, expected in September. She has not eaten much today, only part of an apple. No nausea. She confirms she is eating and getting her protein intake.   Diabetes She presents for her follow-up diabetic visit. She has type 2 diabetes mellitus. There are no hypoglycemic associated symptoms. Pertinent negatives for diabetes include no blurred vision, no polydipsia, no polyphagia and no polyuria. There are no hypoglycemic complications. Risk factors for coronary artery disease include diabetes mellitus, dyslipidemia, hypertension, sedentary lifestyle and obesity. An ACE inhibitor/angiotensin II receptor blocker is being taken. Eye exam is current.  Hypertension This is a chronic problem. The current episode started more than 1 month ago. The problem is unchanged. Pertinent negatives include no blurred vision, neck pain or orthopnea. Compliance problems include exercise.      Past Medical History:  Diagnosis Date   Acute cystitis    Allergic rhinitis    Childhood asthma    no problems as adult - no inhaler   Chronic insomnia    Chronic pain due to trauma 02/2001   closed head trauma - Followed by Dr Elspeth M[facility]   Complication of anesthesia    Dyspepsia    Family history of ovarian cancer    Head trauma 02/24/2001   closed   History of kidney stones    passed stone -  no surgery required   Hypertension    Hypothyroidism    PONV (postoperative nausea and vomiting)    Pre-diabetes    Sleep apnea    Pt denies   SVD (spontaneous vaginal delivery)    fetal demise at 5 months     Family History  Problem Relation Age of Onset   Multiple sclerosis Mother 63   Bladder Cancer Mother 93   Heart Problems Father    Dementia Paternal Aunt    Ovarian cancer Maternal Grandmother    Lung cancer Maternal Grandfather     Tuberculosis Paternal Grandmother      Current Outpatient Medications:    acetaminophen  (TYLENOL ) 500 MG tablet, Take 1,000 mg by mouth every 6 (six) hours as needed for moderate pain (pain score 4-6)., Disp: , Rfl:    baclofen (LIORESAL) 10 MG tablet, Take 10 mg by mouth 3 (three) times daily as needed for muscle spasms., Disp: , Rfl:    butalbital-acetaminophen -caffeine (FIORICET) 50-325-40 MG tablet, Take 1 tablet by mouth every 6 (six) hours as needed for headache., Disp: , Rfl:    cetirizine  (ZYRTEC  ALLERGY) 10 MG tablet, Take 1 tablet (10 mg total) by mouth daily. (Patient taking differently: Take 10 mg by mouth daily as needed for allergies.), Disp: 90 tablet, Rfl: 0   Continuous Glucose Sensor (FREESTYLE LIBRE 3 PLUS SENSOR) MISC, Use as directed to check blood sugars once daily., Disp: 3 each, Rfl: 3   cyanocobalamin  (VITAMIN B12) 1000 MCG tablet, Take 1,000 mcg by mouth daily as needed (mouth ulcers)., Disp: , Rfl:    diclofenac (FLECTOR) 1.3 % PTCH, Place 1 patch onto the skin daily as needed (pain)., Disp: , Rfl:    diclofenac (VOLTAREN) 75 MG EC tablet, Take 75 mg by mouth 2 (two) times daily as needed for moderate pain (pain score 4-6)., Disp: , Rfl:    diclofenac Sodium (VOLTAREN) 1 % GEL, Apply 1 Application topically 4 (four) times daily as needed (pain)., Disp: , Rfl:    diphenhydrAMINE  (BENADRYL ) 25 mg capsule, Take 25 mg by mouth every 4 (four) hours as needed for allergies., Disp: , Rfl:    EPINEPHrine 0.3 mg/0.3 mL IJ SOAJ injection, Inject 0.3 mg into the muscle See admin instructions., Disp: , Rfl:    fluconazole  (DIFLUCAN ) 150 MG tablet, Take 1 tab by mouth today repeat in 5 days, Disp: 2 tablet, Rfl: 0   fluticasone  (FLONASE) 50 MCG/ACT nasal spray, Place 1 spray into both nostrils daily as needed for allergies., Disp: , Rfl:    ibuprofen  (ADVIL ) 200 MG tablet, Take 600 mg by mouth every 6 (six) hours as needed for moderate pain (pain score 4-6)., Disp: , Rfl:     MAGNESIUM PO, Take 1 tablet by mouth daily as needed (cramping)., Disp: , Rfl:    meclizine (ANTIVERT) 25 MG tablet, Take 25 mg by mouth every 4 (four) hours as needed for dizziness (vertigo)., Disp: , Rfl:    methocarbamol (ROBAXIN) 500 MG tablet, Take 500 mg by mouth every 8 (eight) hours as needed for muscle spasms., Disp: , Rfl:    morphine  (MSIR) 15 MG tablet, Take 15 mg by mouth 3 (three) times daily as needed for severe pain (pain score 7-10)., Disp: , Rfl:    promethazine  (PHENERGAN ) 25 MG tablet, TAKE 1/2 TO 1 (ONE-HALF TO ONE) TABLET BY MOUTH EVERY 8 HOURS AS NEEDED FOR NAUSEA (Patient taking differently: Take 25 mg by mouth every 4 (four) hours as needed for vomiting or nausea.),  Disp: 40 tablet, Rfl: 0   RESTASIS 0.05 % ophthalmic emulsion, Place 1 drop into both eyes at bedtime., Disp: , Rfl:    SUDOGEST MAXIMUM STRENGTH 30 MG tablet, TAKE 1 TABLET BY MOUTH TWICE DAILY AS NEEDED FOR CONGESTION (Patient taking differently: Take 30 mg by mouth every 4 (four) hours as needed for congestion.), Disp: 90 tablet, Rfl: 0   tirzepatide  (MOUNJARO ) 10 MG/0.5ML Pen, Inject 10 mg into the skin once a week., Disp: 2 mL, Rfl: 2   TURMERIC PO, Take 1,500 mg by mouth daily as needed (inflammation)., Disp: , Rfl:    Vitamin D , Ergocalciferol , (DRISDOL ) 1.25 MG (50000 UNIT) CAPS capsule, Take 1 capsule (50,000 Units total) by mouth once a week., Disp: 5 capsule, Rfl: 0   albuterol  (PROAIR  HFA) 108 (90 Base) MCG/ACT inhaler, Inhale 2 puffs into the lungs every 4 (four) hours as needed for wheezing or shortness of breath., Disp: 1 each, Rfl: 1   furosemide  (LASIX ) 80 MG tablet, TAKE 1 TABLET BY MOUTH ONCE DAILY AS NEEDED FOR FLUID, Disp: 90 tablet, Rfl: 0   levothyroxine  (SYNTHROID ) 100 MCG tablet, TAKE 1 TABLET BY MOUTH BEFORE BREAKFAST, Disp: 90 tablet, Rfl: 1   Allergies  Allergen Reactions   Doxycycline  Anaphylaxis, Diarrhea and Nausea And Vomiting    headache   Empagliflozin      Other Reaction(s):  Other (See Comments)  Yeast and urinary infection   Lysteda [Tranexamic Acid] Anaphylaxis    Tongue swelling, facial numbness, hand went numb. Felt like throat was closing    Megestrol Anaphylaxis    Tongue swelling, felt like throat was closing    Oxycodone -Acetaminophen  Hives and Nausea And Vomiting    Can take plain tylenol    Prochlorperazine Edisylate Other (See Comments)    Hyper and jitteriness   Amoxicillin  Nausea And Vomiting and Other (See Comments)   Cortisone     Flushed    Eszopiclone Other (See Comments)    Metallic taste and became un effective    Keppra [Levetiracetam] Other (See Comments)    hyperactive   Latex     Condoms cause vaginal irritation   Neurontin  [Gabapentin ] Nausea And Vomiting   Ozempic  (0.25 Or 0.5 Mg-Dose) [Semaglutide (0.25 Or 0.5mg -Dos)] Diarrhea and Nausea And Vomiting    Bad taste in mouth    Propoxyphene Hives   Robitussin Dm Max Day-Night Hives   Tape     PAPER TAPE-skin burns/severe irritation  PATIENT DOES NOT TOLERATE PAPER TAPE   Trileptal [Oxcarbazepine] Other (See Comments)    REACTION: immune system suppressed   Bactrim  [Sulfamethoxazole -Trimethoprim ] Rash   Bupropion Rash   Codeine Rash   Moxifloxacin Swelling and Rash   Vicodin [Hydrocodone-Acetaminophen ] Rash     Review of Systems  Constitutional: Negative.   Eyes:  Negative for blurred vision.  Respiratory: Negative.    Cardiovascular: Negative.  Negative for orthopnea.  Gastrointestinal: Negative.   Endocrine: Negative for polydipsia, polyphagia and polyuria.  Musculoskeletal:  Negative for neck pain.  Neurological: Negative.   Psychiatric/Behavioral: Negative.       Today's Vitals   11/20/23 1019  BP: 134/82  Pulse: 91  Temp: 98 F (36.7 C)  SpO2: 98%  Weight: (!) 346 lb 9.6 oz (157.2 kg)  Height: 5' 6 (1.676 m)   Body mass index is 55.94 kg/m.  Wt Readings from Last 3 Encounters:  11/20/23 (!) 346 lb 9.6 oz (157.2 kg)  09/26/23 (!) 364 lb (165.1 kg)   08/19/23 (!) 365 lb (165.6 kg)  Objective:  Physical Exam Vitals and nursing note reviewed.  Constitutional:      Appearance: Normal appearance. She is obese.  HENT:     Head: Normocephalic and atraumatic.  Eyes:     Extraocular Movements: Extraocular movements intact.  Cardiovascular:     Rate and Rhythm: Normal rate and regular rhythm.     Heart sounds: Normal heart sounds.  Pulmonary:     Effort: Pulmonary effort is normal.     Breath sounds: Normal breath sounds.  Musculoskeletal:     Cervical back: Normal range of motion.     Right lower leg: Edema present.     Left lower leg: Edema present.  Skin:    General: Skin is warm.  Neurological:     General: No focal deficit present.     Mental Status: She is alert.  Psychiatric:        Mood and Affect: Mood normal.        Behavior: Behavior normal.         Assessment And Plan:  Essential hypertension, benign Assessment & Plan: Chronic, fair control. Goal BP<130/80. Previously intolerant of both losartan  and valsartan  - Continue furosemide  dosing Monday, Wednesday, and Friday. - Keep upcoming appt at HTN clinic  Orders: -     CMP14+EGFR -     Lipid panel  Hyperlipidemia associated with type 2 diabetes mellitus (HCC) Assessment & Plan: Recent dietary changes led to elevated glucose levels. Limited [name] Wright data shows poor control. Dietary changes necessary for optimal management. - Increase Maduro dosage to 10 mg. - Encourage dietary modifications to improve glucose control. - Schedule next visit for physical examination.  Orders: -     CMP14+EGFR -     Lipid panel -     Hemoglobin A1c  Primary hypothyroidism Assessment & Plan: Stable on levothyroxine  100 mcg daily. Prescription renewal needed. - Renew levothyroxine  prescription (generic).   Chronic constipation Assessment & Plan: Recurring issue, potentially exacerbated by dietary habits. Vitamin supplement with probiotic used for stool softening. -  Continue vitamin supplement with probiotic for stool softening.   Leg swelling Assessment & Plan: Chronic, sx likely exacerbated by limited physical activity. - Continue furosemide  prn - Increase daily activity - Wear compression hose - Elevate legs when seated  Orders: -     Furosemide ; TAKE 1 TABLET BY MOUTH ONCE DAILY AS NEEDED FOR FLUID  Dispense: 90 tablet; Refill: 0  Cough in adult Assessment & Plan: Challenges due to environmental exposure. Flonase used for maintenance, Benadryl  for acute relief. - Continue Flonase for maintenance. - Use Benadryl  as needed for acute symptoms.  Orders: -     Albuterol  Sulfate HFA; Inhale 2 puffs into the lungs every 4 (four) hours as needed for wheezing or shortness of breath.  Dispense: 1 each; Refill: 1  Class 3 severe obesity due to excess calories with body mass index (BMI) of 50.0 to 59.9 in adult Assessment & Plan: BMI 55.  Managed with lifestyle changes and Mounjaro , resulting in weight loss. - Encourage dietary modifications and portion control. - Encourage physical activity, including water aerobics and postprandial walks.   Other orders -     Mounjaro ; Inject 10 mg into the skin once a week.  Dispense: 2 mL; Refill: 2 -     Levothyroxine  Sodium; TAKE 1 TABLET BY MOUTH BEFORE BREAKFAST  Dispense: 90 tablet; Refill: 1   Return in 3 months (on 02/20/2024), or dm check, for feb 2026 - please schedule physical.  Patient was  given opportunity to ask questions. Patient verbalized understanding of the plan and was able to repeat key elements of the plan. All questions were answered to their satisfaction.   I, Catheryn LOISE Slocumb, MD, have reviewed all documentation for this visit. The documentation on 11/20/23 for the exam, diagnosis, procedures, and orders are all accurate and complete.   IF YOU HAVE BEEN REFERRED TO A SPECIALIST, IT MAY TAKE 1-2 WEEKS TO SCHEDULE/PROCESS THE REFERRAL. IF YOU HAVE NOT HEARD FROM US /SPECIALIST IN TWO  WEEKS, PLEASE GIVE US  A CALL AT [phone].   THE PATIENT IS ENCOURAGED TO PRACTICE SOCIAL DISTANCING DUE TO THE COVID-19 PANDEMIC.

## 2023-11-20 NOTE — Patient Instructions (Signed)
 Hypertension, Adult Hypertension is another name for high blood pressure. High blood pressure forces your heart to work harder to pump blood. This can cause problems over time. There are two numbers in a blood pressure reading. There is a top number (systolic) over a bottom number (diastolic). It is best to have a blood pressure that is below 120/80. What are the causes? The cause of this condition is not known. Some other conditions can lead to high blood pressure. What increases the risk? Some lifestyle factors can make you more likely to develop high blood pressure: Smoking. Not getting enough exercise or physical activity. Being overweight. Having too much fat, sugar, calories, or salt (sodium) in your diet. Drinking too much alcohol . Other risk factors include: Having any of these conditions: Heart disease. Diabetes. High cholesterol. Kidney disease. Obstructive sleep apnea. Having a family history of high blood pressure and high cholesterol. Age. The risk increases with age. Stress. What are the signs or symptoms? High blood pressure may not cause symptoms. Very high blood pressure (hypertensive crisis) may cause: Headache. Fast or uneven heartbeats (palpitations). Shortness of breath. Nosebleed. Vomiting or feeling like you may vomit (nauseous). Changes in how you see. Very bad chest pain. Feeling dizzy. Seizures. How is this treated? This condition is treated by making healthy lifestyle changes, such as: Eating healthy foods. Exercising more. Drinking less alcohol . Your doctor may prescribe medicine if lifestyle changes do not help enough and if: Your top number is above 130. Your bottom number is above 80. Your personal target blood pressure may vary. Follow these instructions at home: Eating and drinking  If told, follow the DASH eating plan. To follow this plan: Fill one half of your plate at each meal with fruits and vegetables. Fill one fourth of your plate  at each meal with whole grains. Whole grains include whole-wheat pasta, brown rice, and whole-grain bread. Eat or drink low-fat dairy products, such as skim milk or low-fat yogurt. Fill one fourth of your plate at each meal with low-fat (lean) proteins. Low-fat proteins include fish, chicken without skin, eggs, beans, and tofu. Avoid fatty meat, cured and processed meat, or chicken with skin. Avoid pre-made or processed food. Limit the amount of salt in your diet to less than 1,500 mg each day. Do not drink alcohol  if: Your doctor tells you not to drink. You are pregnant, may be pregnant, or are planning to become pregnant. If you drink alcohol : Limit how much you have to: 0-1 drink a day for women. 0-2 drinks a day for men. Know how much alcohol  is in your drink. In the [place] one drink equals one 12 oz bottle of beer (355 mL), one 5 oz glass of wine (148 mL), or one 1 oz glass of hard liquor (44 mL). Lifestyle  Work with your doctor to stay at a healthy weight or to lose weight. Ask your doctor what the best weight is for you. Get at least 30 minutes of exercise that causes your heart to beat faster (aerobic exercise) most days of the week. This may include walking, swimming, or biking. Get at least 30 minutes of exercise that strengthens your muscles (resistance exercise) at least 3 days a week. This may include lifting weights or doing Pilates. Do not smoke or use any products that contain nicotine  or tobacco. If you need help quitting, ask your doctor. Check your blood pressure at home as told by your doctor. Keep all follow-up visits. Medicines Take over-the-counter and prescription medicines  only as told by your doctor. Follow directions carefully. Do not skip doses of blood pressure medicine. The medicine does not work as well if you skip doses. Skipping doses also puts you at risk for problems. Ask your doctor about side effects or reactions to medicines that you should watch  for. Contact a doctor if: You think you are having a reaction to the medicine you are taking. You have headaches that keep coming back. You feel dizzy. You have swelling in your ankles. You have trouble with your vision. Get help right away if: You get a very bad headache. You start to feel mixed up (confused). You feel weak or numb. You feel faint. You have very bad pain in your: Chest. Belly (abdomen). You vomit more than once. You have trouble breathing. These symptoms may be an emergency. Get help right away. Call 911. Do not wait to see if the symptoms will go away. Do not drive yourself to the hospital. Summary Hypertension is another name for high blood pressure. High blood pressure forces your heart to work harder to pump blood. For most people, a normal blood pressure is less than 120/80. Making healthy choices can help lower blood pressure. If your blood pressure does not get lower with healthy choices, you may need to take medicine. This information is not intended to replace advice given to you by your health care provider. Make sure you discuss any questions you have with your health care provider. Document Revised: 01/12/2021 Document Reviewed: 01/12/2021 Elsevier Patient Education  2024 [employer].

## 2023-11-21 LAB — HEMOGLOBIN A1C
Est. average glucose Bld gHb Est-mCnc: 143 mg/dL
Hgb A1c MFr Bld: 6.6 % — ABNORMAL HIGH (ref 4.8–5.6)

## 2023-11-21 LAB — LIPID PANEL
Chol/HDL Ratio: 3.8 ratio (ref 0.0–4.4)
Cholesterol, Total: 186 mg/dL (ref 100–199)
HDL: 49 mg/dL (ref >39)
LDL Chol Calc (NIH): 111 mg/dL — ABNORMAL HIGH (ref 0–99)
Triglycerides: 146 mg/dL (ref 0–149)
VLDL Cholesterol Cal: 26 mg/dL (ref 5–40)

## 2023-11-21 LAB — CMP14+EGFR
ALT: 22 IU/L (ref 0–32)
AST: 23 IU/L (ref 0–40)
Albumin: 4 g/dL (ref 3.8–4.9)
Alkaline Phosphatase: 137 IU/L — ABNORMAL HIGH (ref 44–121)
BUN/Creatinine Ratio: 19 (ref 9–23)
BUN: 14 mg/dL (ref 6–24)
Bilirubin Total: 0.3 mg/dL (ref 0.0–1.2)
CO2: 22 mmol/L (ref 20–29)
Calcium: 9.1 mg/dL (ref 8.7–10.2)
Chloride: 101 mmol/L (ref 96–106)
Creatinine, Ser: 0.74 mg/dL (ref 0.57–1.00)
Globulin, Total: 3.3 g/dL (ref 1.5–4.5)
Glucose: 150 mg/dL — ABNORMAL HIGH (ref 70–99)
Potassium: 4.8 mmol/L (ref 3.5–5.2)
Sodium: 139 mmol/L (ref 134–144)
Total Protein: 7.3 g/dL (ref 6.0–8.5)
eGFR: 95 mL/min/1.73 (ref >59)

## 2023-11-22 ENCOUNTER — Ambulatory Visit: Payer: Self-pay | Admitting: Internal Medicine

## 2023-11-22 ENCOUNTER — Encounter

## 2023-11-24 DIAGNOSIS — M7989 Other specified soft tissue disorders: Secondary | ICD-10-CM | POA: Insufficient documentation

## 2023-11-24 DIAGNOSIS — K5909 Other constipation: Secondary | ICD-10-CM | POA: Insufficient documentation

## 2023-11-24 MED ORDER — LEVOTHYROXINE SODIUM 100 MCG PO TABS: ORAL_TABLET | ORAL | 1 refills | Status: AC

## 2023-11-24 NOTE — Assessment & Plan Note (Signed)
 Challenges due to environmental exposure. Flonase used for maintenance, Benadryl  for acute relief. - Continue Flonase for maintenance. - Use Benadryl  as needed for acute symptoms.

## 2023-11-24 NOTE — Assessment & Plan Note (Signed)
 Recent dietary changes led to elevated glucose levels. Limited Herlene data shows poor control. Dietary changes necessary for optimal management. - Increase Maduro dosage to 10 mg. - Encourage dietary modifications to improve glucose control. - Schedule next visit for physical examination.

## 2023-11-24 NOTE — Assessment & Plan Note (Signed)
 Chronic, fair control. Goal BP<130/80. Previously intolerant of both losartan  and valsartan  - Continue furosemide  dosing Monday, Wednesday, and Friday. - Keep upcoming appt at HTN clinic

## 2023-11-24 NOTE — Assessment & Plan Note (Signed)
 Chronic, sx likely exacerbated by limited physical activity. - Continue furosemide  prn - Increase daily activity - Wear compression hose - Elevate legs when seated

## 2023-11-24 NOTE — Assessment & Plan Note (Signed)
 BMI 55.  Managed with lifestyle changes and Mounjaro , resulting in weight loss. - Encourage dietary modifications and portion control. - Encourage physical activity, including water aerobics and postprandial walks.

## 2023-11-24 NOTE — Assessment & Plan Note (Signed)
 Stable on levothyroxine  100 mcg daily. Prescription renewal needed. - Renew levothyroxine  prescription (generic).

## 2023-11-24 NOTE — Assessment & Plan Note (Signed)
 Recurring issue, potentially exacerbated by dietary habits. Vitamin supplement with probiotic used for stool softening. - Continue vitamin supplement with probiotic for stool softening.

## 2023-11-27 ENCOUNTER — Ambulatory Visit (HOSPITAL_BASED_OUTPATIENT_CLINIC_OR_DEPARTMENT_OTHER): Payer: Self-pay | Admitting: Physical Therapy

## 2023-11-27 ENCOUNTER — Encounter (HOSPITAL_BASED_OUTPATIENT_CLINIC_OR_DEPARTMENT_OTHER): Payer: Self-pay | Admitting: Physical Therapy

## 2023-11-29 ENCOUNTER — Ambulatory Visit (INDEPENDENT_AMBULATORY_CARE_PROVIDER_SITE_OTHER)

## 2023-11-29 DIAGNOSIS — Z0000 Encounter for general adult medical examination without abnormal findings: Secondary | ICD-10-CM | POA: Diagnosis not present

## 2023-11-29 NOTE — Progress Notes (Signed)
 Subjective:   [name] Wright is a 58 y.o. female who presents for Medicare Annual (Subsequent) preventive examination.  Visit Complete: Virtual I connected with  [name] Wright on 11/29/23 by a audio enabled telemedicine application and verified that I am speaking with the correct person using two identifiers.  Patient Location: Home  Provider Location: Office/Clinic  I discussed the limitations of evaluation and management by telemedicine. The patient expressed understanding and agreed to proceed.  Vital Signs: Because this visit was a virtual/telehealth visit, some criteria may be missing or patient reported. Any vitals not documented were not able to be obtained and vitals that have been documented are patient reported.  Patient Medicare AWV questionnaire was completed by the patient on 11/29/2023; I have confirmed that all information answered by patient is correct and no changes since this date.        Objective:    There were no vitals filed for this visit. There is no height or weight on file to calculate BMI.     09/11/2023    7:24 AM 06/07/2023    6:09 AM 10/31/2022    2:51 PM 10/18/2021    2:54 PM 10/05/2020   10:21 AM 08/25/2020    7:25 AM 08/13/2019    9:23 AM  Advanced Directives  Does Patient Have a Medical Advance Directive? No No No No No Yes No  Type of Advance Directive      Living will   Would patient like information on creating a medical advance directive? No - Patient declined No - Patient declined  No - Patient declined No - Patient declined  No - Patient declined    Current Medications (verified) Outpatient Encounter Medications as of 11/29/2023  Medication Sig   acetaminophen  (TYLENOL ) 500 MG tablet Take 1,000 mg by mouth every 6 (six) hours as needed for moderate pain (pain score 4-6).   albuterol  (PROAIR  HFA) 108 (90 Base) MCG/ACT inhaler Inhale 2 puffs into the lungs every 4 (four) hours as needed for wheezing or shortness of breath.    baclofen (LIORESAL) 10 MG tablet Take 10 mg by mouth 3 (three) times daily as needed for muscle spasms.   butalbital-acetaminophen -caffeine (FIORICET) 50-325-40 MG tablet Take 1 tablet by mouth every 6 (six) hours as needed for headache.   cetirizine  (ZYRTEC  ALLERGY) 10 MG tablet Take 1 tablet (10 mg total) by mouth daily. (Patient taking differently: Take 10 mg by mouth daily as needed for allergies.)   Continuous Glucose Sensor (FREESTYLE LIBRE 3 PLUS SENSOR) MISC Use as directed to check blood sugars once daily.   cyanocobalamin  (VITAMIN B12) 1000 MCG tablet Take 1,000 mcg by mouth daily as needed (mouth ulcers).   diclofenac (FLECTOR) 1.3 % PTCH Place 1 patch onto the skin daily as needed (pain).   diclofenac (VOLTAREN) 75 MG EC tablet Take 75 mg by mouth 2 (two) times daily as needed for moderate pain (pain score 4-6).   diclofenac Sodium (VOLTAREN) 1 % GEL Apply 1 Application topically 4 (four) times daily as needed (pain).   diphenhydrAMINE  (BENADRYL ) 25 mg capsule Take 25 mg by mouth every 4 (four) hours as needed for allergies.   EPINEPHrine 0.3 mg/0.3 mL IJ SOAJ injection Inject 0.3 mg into the muscle See admin instructions.   fluconazole  (DIFLUCAN ) 150 MG tablet Take 1 tab by mouth today repeat in 5 days   fluticasone  (FLONASE) 50 MCG/ACT nasal spray Place 1 spray into both nostrils daily as needed for allergies.   furosemide  (LASIX )  80 MG tablet TAKE 1 TABLET BY MOUTH ONCE DAILY AS NEEDED FOR FLUID   ibuprofen  (ADVIL ) 200 MG tablet Take 600 mg by mouth every 6 (six) hours as needed for moderate pain (pain score 4-6).   levothyroxine  (SYNTHROID ) 100 MCG tablet TAKE 1 TABLET BY MOUTH BEFORE BREAKFAST   MAGNESIUM PO Take 1 tablet by mouth daily as needed (cramping).   meclizine (ANTIVERT) 25 MG tablet Take 25 mg by mouth every 4 (four) hours as needed for dizziness (vertigo).   methocarbamol (ROBAXIN) 500 MG tablet Take 500 mg by mouth every 8 (eight) hours as needed for muscle spasms.    morphine  (MSIR) 15 MG tablet Take 15 mg by mouth 3 (three) times daily as needed for severe pain (pain score 7-10).   promethazine  (PHENERGAN ) 25 MG tablet TAKE 1/2 TO 1 (ONE-HALF TO ONE) TABLET BY MOUTH EVERY 8 HOURS AS NEEDED FOR NAUSEA (Patient taking differently: Take 25 mg by mouth every 4 (four) hours as needed for vomiting or nausea.)   RESTASIS 0.05 % ophthalmic emulsion Place 1 drop into both eyes at bedtime.   SUDOGEST MAXIMUM STRENGTH 30 MG tablet TAKE 1 TABLET BY MOUTH TWICE DAILY AS NEEDED FOR CONGESTION (Patient taking differently: Take 30 mg by mouth every 4 (four) hours as needed for congestion.)   tirzepatide  (MOUNJARO ) 10 MG/0.5ML Pen Inject 10 mg into the skin once a week.   TURMERIC PO Take 1,500 mg by mouth daily as needed (inflammation).   Vitamin D , Ergocalciferol , (DRISDOL ) 1.25 MG (50000 UNIT) CAPS capsule Take 1 capsule (50,000 Units total) by mouth once a week.   No facility-administered encounter medications on file as of 11/29/2023.    Allergies (verified) Doxycycline , Empagliflozin , Lysteda [tranexamic acid], Megestrol, Oxycodone -acetaminophen , Prochlorperazine edisylate, Amoxicillin , Cortisone, Eszopiclone, Keppra [levetiracetam], Latex, Neurontin  [gabapentin ], Ozempic  (0.25 or 0.5 mg-dose) [semaglutide (0.25 or 0.5mg -dos)], Propoxyphene, Robitussin dm max day-night, Tape, Trileptal [oxcarbazepine], Bactrim  [sulfamethoxazole -trimethoprim ], Bupropion, Codeine, Moxifloxacin, and Vicodin [hydrocodone-acetaminophen ]   History: Past Medical History:  Diagnosis Date   Acute cystitis    Allergic rhinitis    Childhood asthma    no problems as adult - no inhaler   Chronic insomnia    Chronic pain due to trauma 02/2001   closed head trauma - Followed by Dr Elspeth Mems [facility]   Complication of anesthesia    Dyspepsia    Family history of ovarian cancer    Head trauma 02/24/2001   closed   History of kidney stones    passed stone - no surgery  required   Hypertension    Hypothyroidism    PONV (postoperative nausea and vomiting)    Pre-diabetes    Sleep apnea    Pt denies   SVD (spontaneous vaginal delivery)    fetal demise at 5 months   Past Surgical History:  Procedure Laterality Date   APPENDECTOMY     BIOPSY  08/25/2020   Procedure: BIOPSY;  Surgeon: San Sandor GAILS, DO;  Location: [facility];  Service: Gastroenterology;;   COLONOSCOPY WITH PROPOFOL  N/A 08/25/2020   Procedure: COLONOSCOPY WITH PROPOFOL ;  Surgeon: San Sandor GAILS, DO;  Location: [facility];  Service: Gastroenterology;  Laterality: N/A;   DILATATION & CURETTAGE/HYSTEROSCOPY WITH MYOSURE N/A 04/15/2017   Procedure: DILATATION & CURETTAGE/HYSTEROSCOPY WITH MYOSURE POLYPECTOMY;  Surgeon: Curlene Agent, MD;  Location: [facility];  Service: Gynecology;  Laterality: N/A;   DILATATION & CURETTAGE/HYSTEROSCOPY WITH MYOSURE N/A 06/18/2019   Procedure: DILATATION & CURETTAGE/HYSTEROSCOPY WITH  MYOSURE;  Surgeon: Curlene Agent, MD;  Location: [facility];  Service: Gynecology;  Laterality: N/A;   DILATATION & CURETTAGE/HYSTEROSCOPY WITH MYOSURE N/A 06/07/2023   Procedure: DILATATION & CURETTAGE/HYSTEROSCOPY WITH  MYOSURE;  Surgeon: Curlene Agent, MD;  Location[facility];  Service: Gynecology;  Laterality: N/A;  POSSIBLE MYOSURE [facility]   ESOPHAGOGASTRODUODENOSCOPY (EGD) WITH PROPOFOL  N/A 08/25/2020   Procedure: ESOPHAGOGASTRODUODENOSCOPY (EGD) WITH PROPOFOL ;  Surgeon: San Sandor GAILS, DO;  Location: [facility];  Service: Gastroenterology;  Laterality: N/A;   EYE SURGERY     cataracts removed   INCONTINENCE SURGERY     PILONIDAL CYST EXCISION     x2   POLYPECTOMY  08/25/2020   Procedure: POLYPECTOMY;  Surgeon: San Sandor GAILS, DO;  Location: [facility];  Service: Gastroenterology;;   repair of toe laceration     dermabond to right big toe right foot   repair of torn ligament right leg     patient denies this surgery   right foot fracture     cast only    TONSILLECTOMY     WISDOM TOOTH EXTRACTION     Family History  Problem Relation Age of Onset   Multiple sclerosis Mother 60   Bladder Cancer Mother 25   Heart Problems Father    Dementia Paternal Aunt    Ovarian cancer Maternal Grandmother    Lung cancer Maternal Grandfather    Tuberculosis Paternal Grandmother    Social History   Socioeconomic History   Marital status: Married    Spouse name: Not on file   Number of children: 0   Years of education: Not on file   Highest education level: Bachelor's degree (e.g., BA, AB, BS)  Occupational History   Occupation: disability  Tobacco Use   Smoking status: Never   Smokeless tobacco: Never  Vaping Use   Vaping status: Never Used  Substance and Sexual Activity   Alcohol use: No    Comment: 1 drink/year   Drug use: Never   Sexual activity: Not Currently    Birth control/protection: None  Other Topics Concern   Not on file  Social History Narrative   [employer], BS, 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   Married- '94- 8 years divorced, Married '03   No children   Retired- disability from closed head injury with focus, cognition   Social Drivers of Corporate investment banker Strain: Low Risk  (09/26/2023)   Overall Financial Resource Strain (CARDIA)    Difficulty of Paying Living Expenses: Not very hard  Recent Concern: Financial Resource Strain - Medium Risk (08/07/2023)   Overall Financial Resource Strain (CARDIA)    Difficulty of Paying Living Expenses: Somewhat hard  Food Insecurity: No Food Insecurity (09/26/2023)   Hunger Vital Sign    Worried About Running Out of Food in the Last Year: Never true    Ran Out of Food in the Last Year: Never true  Transportation Needs: Unmet Transportation Needs (09/26/2023)   PRAPARE - Administrator, Civil Service (Medical): Yes    Lack of Transportation (Non-Medical): No  Physical Activity: Inactive (09/26/2023)   Exercise Vital Sign    Days of Exercise per Week: 0 days    Minutes  of Exercise per Session: Not on file  Stress: No Stress Concern Present (09/26/2023)   [employer] of Occupational Health - Occupational Stress Questionnaire    Feeling of Stress: Not at all  Social Connections: Socially Integrated (09/26/2023)   Social Connection and Isolation Panel    Frequency of Communication with Friends and Family:  More than three times a week    Frequency of Social Gatherings with Friends and Family: Once a week    Attends Religious Services: More than 4 times per year    Active Member of [employer] or Organizations: Yes    Attends Engineer, structural: More than 4 times per year    Marital Status: Married    Tobacco Counseling Counseling given: Not Answered   Clinical Intake:                        Activities of Daily Living    06/07/2023    6:19 AM  In your present state of health, do you have any difficulty performing the following activities:  Hearing? 0  Vision? 0  Difficulty concentrating or making decisions? 0    Patient Care Team: Jarold Medici, MD as PCP - General (Internal Medicine) Rudy Dorothyann DASEN, RPH-CPP (Pharmacist) Jon Franky BRAVO, MD as Referring Physician (Anesthesiology) [facility], [facility]  Indicate any recent Medical Services you may have received from other than [facility] providers in the past year (date may be approximate).     Assessment:   This is a routine wellness examination for [place].  Hearing/Vision screen No results found.   Goals Addressed   None    Depression Screen    11/20/2023   10:22 AM 09/26/2023    2:20 PM 08/08/2023    2:44 PM 10/31/2022    2:53 PM 05/15/2022    3:07 PM 10/18/2021    2:55 PM 10/05/2020   10:23 AM  PHQ 2/9 Scores  PHQ - 2 Score 0 0 0 0 0 0 0  PHQ- 9 Score 0  0 6       Fall Risk    11/20/2023   10:22 AM 11/20/2023   10:21 AM 09/26/2023    2:20 PM 08/19/2023    2:16 PM 08/08/2023    2:44 PM  Fall Risk   Falls in the past year? 0 1 0 0 0   Number falls in past yr: 0 1 0 0 0  Injury with Fall? 0 0 0 0 0  Risk for fall due to : No Fall Risks History of fall(s) No Fall Risks No Fall Risks No Fall Risks  Follow up Falls evaluation completed Falls evaluation completed Falls evaluation completed Falls evaluation completed Falls evaluation completed    MEDICARE RISK AT HOME:    TIMED UP AND GO:  Was the test performed?  No    Cognitive Function:        10/31/2022    2:55 PM 10/18/2021    2:57 PM 10/05/2020   10:24 AM 08/13/2019    9:27 AM 08/12/2018    9:24 AM  6CIT Screen  What Year? 0 points 0 points 0 points 0 points 0 points  What month? 0 points 0 points 0 points 0 points 0 points  What time? 0 points 0 points 0 points 0 points 0 points  Count back from 20 0 points 0 points 0 points 2 points 0 points  Months in reverse 0 points 0 points 0 points 0 points 0 points  Repeat phrase 0 points 2 points 4 points 0 points 0 points  Total Score 0 points 2 points 4 points 2 points 0 points    Immunizations Immunization History  Administered Date(s) Administered   Td 09/26/2001   Tdap 02/27/2022    TDAP status: Up to date  Flu Vaccine status:  Due, Education has been provided regarding the importance of this vaccine. Advised may receive this vaccine at local pharmacy or Health Dept. Aware to provide a copy of the vaccination record if obtained from local pharmacy or Health Dept. Verbalized acceptance and understanding.  Pneumococcal vaccine status: Due, Education has been provided regarding the importance of this vaccine. Advised may receive this vaccine at local pharmacy or Health Dept. Aware to provide a copy of the vaccination record if obtained from local pharmacy or Health Dept. Verbalized acceptance and understanding.  Covid-19 vaccine status: Information provided on how to obtain vaccines.   Qualifies for Shingles Vaccine? Yes   Zostavax completed No   Shingrix Completed?: No.    Education has been provided  regarding the importance of this vaccine. Patient has been advised to call insurance company to determine out of pocket expense if they have not yet received this vaccine. Advised may also receive vaccine at local pharmacy or Health Dept. Verbalized acceptance and understanding.  Screening Tests Health Maintenance  Topic Date Due   COVID-19 Vaccine (1) Never done   Hepatitis B Vaccines 19-59 Average Risk (1 of 3 - 19+ 3-dose series) Never done   FOOT EXAM  02/28/2023   MAMMOGRAM  07/13/2023   Medicare Annual Wellness (AWV)  10/31/2023   INFLUENZA VACCINE  11/08/2023   Zoster Vaccines- Shingrix (1 of 2) 12/27/2023 (Originally 01/05/1986)   HIV Screening  06/24/2024 (Originally [birth year]05/1982)   Pneumococcal Vaccine: 50+ Years (1 of 2 - PCV) 11/19/2024 (Originally [birth year]1986)   Diabetic kidney evaluation - Urine ACR  01/30/2024   HEMOGLOBIN A1C  05/22/2024   OPHTHALMOLOGY EXAM  06/23/2024   Cervical Cancer Screening (HPV/Pap Cotest)  07/12/2024   Diabetic kidney evaluation - eGFR measurement  11/19/2024   Colonoscopy  08/26/2030   DTaP/Tdap/Td (3 - Td or Tdap) 02/28/2032   Hepatitis C Screening  Completed   HPV VACCINES  Aged Out   Meningococcal B Vaccine  Aged Out    Health Maintenance  Health Maintenance Due  Topic Date Due   COVID-19 Vaccine (1) Never done   Hepatitis B Vaccines 19-59 Average Risk (1 of 3 - 19+ 3-dose series) Never done   FOOT EXAM  02/28/2023   MAMMOGRAM  07/13/2023   Medicare Annual Wellness (AWV)  10/31/2023   INFLUENZA VACCINE  11/08/2023    Colorectal cancer screening: Type of screening: Colonoscopy. Completed 2022. Repeat every 10 years  Mammogram status: Ordered  . Pt provided with contact info and advised to call to schedule appt.    Lung Cancer Screening: (Low Dose CT Chest recommended if Age 49-80 years, 20 pack-year currently smoking OR have quit w/in 15years.) does not qualify.   Lung Cancer Screening Referral:   Additional Screening:  Hepatitis  C Screening: does qualify; Completed 2021  Vision Screening: Recommended annual ophthalmology exams for early detection of glaucoma and other disorders of the eye. Is the patient up to date with their annual eye exam?  Yes  Who is the provider or what is the name of the office in which the patient attends annual eye exams? [facility] If pt is not established with a provider, would they like to be referred to a provider to establish care? No .   Dental Screening: Recommended annual dental exams for proper oral hygiene  Diabetic Foot Exam: Diabetic Foot Exam: Overdue, Pt has been advised about the importance in completing this exam. Pt is scheduled for diabetic foot exam on 02/24/24.  Community Resource Referral /  Chronic Care Management: CRR required this visit?  No   CCM required this visit?  No     Plan:     I have personally reviewed and noted the following in the patient's chart:   Medical and social history Use of alcohol, tobacco or illicit drugs  Current medications and supplements including opioid prescriptions. Patient is not currently taking opioid prescriptions. Functional ability and status Nutritional status Physical activity Advanced directives List of other physicians Hospitalizations, surgeries, and ER visits in previous 12 months Vitals Screenings to include cognitive, depression, and falls Referrals and appointments  In addition, I have reviewed and discussed with patient certain preventive protocols, quality metrics, and best practice recommendations. A written personalized care plan for preventive services as well as general preventive health recommendations were provided to patient.     Kristeen JINNY Lunger, CMA   11/29/2023   After Visit Summary: (MyChart) Due to this being a telephonic visit, the after visit summary with patients personalized plan was offered to patient via MyChart   Nurse Notes:

## 2023-12-02 ENCOUNTER — Encounter: Payer: Self-pay | Admitting: Internal Medicine

## 2023-12-04 ENCOUNTER — Encounter (HOSPITAL_BASED_OUTPATIENT_CLINIC_OR_DEPARTMENT_OTHER): Payer: Self-pay

## 2023-12-04 ENCOUNTER — Ambulatory Visit (HOSPITAL_BASED_OUTPATIENT_CLINIC_OR_DEPARTMENT_OTHER): Payer: Self-pay

## 2023-12-04 DIAGNOSIS — G8929 Other chronic pain: Secondary | ICD-10-CM | POA: Diagnosis not present

## 2023-12-04 DIAGNOSIS — R2689 Other abnormalities of gait and mobility: Secondary | ICD-10-CM

## 2023-12-04 DIAGNOSIS — M6281 Muscle weakness (generalized): Secondary | ICD-10-CM | POA: Diagnosis not present

## 2023-12-04 DIAGNOSIS — R293 Abnormal posture: Secondary | ICD-10-CM | POA: Diagnosis not present

## 2023-12-04 DIAGNOSIS — M25562 Pain in left knee: Secondary | ICD-10-CM | POA: Diagnosis not present

## 2023-12-04 DIAGNOSIS — M25561 Pain in right knee: Secondary | ICD-10-CM | POA: Diagnosis not present

## 2023-12-04 NOTE — Therapy (Signed)
 OUTPATIENT PHYSICAL THERAPY LOWER EXTREMITY TREATMENT Patient Name: [name] Wright MRN: [id number] DOB:11-16-1965, 58 y.o., female Today's Date: 12/04/2023  END OF SESSION:  PT End of Session - 12/04/23 0819     Visit Number 19    Date for PT Re-Evaluation 01/03/24    Authorization Type [employer] MCR    Authorization Time Period 8/1-8/29/25    Authorization - Visit Number 4    Authorization - Number of Visits 4    Progress Note Due on Visit 26    PT Start Time 0802    PT Stop Time 0846    PT Time Calculation (min) 44 min    Activity Tolerance Patient tolerated treatment well    Behavior During Therapy [facility] for tasks assessed/performed              Past Medical History:  Diagnosis Date   Acute cystitis    Allergic rhinitis    Childhood asthma    no problems as adult - no inhaler   Chronic insomnia    Chronic pain due to trauma 02/2001   closed head trauma - Followed by Dr Elspeth Mems [facility]   Complication of anesthesia    Dyspepsia    Family history of ovarian cancer    Head trauma 02/24/2001   closed   History of kidney stones    passed stone - no surgery required   Hypertension    Hypothyroidism    PONV (postoperative nausea and vomiting)    Pre-diabetes    Sleep apnea    Pt denies   SVD (spontaneous vaginal delivery)    fetal demise at 5 months   Past Surgical History:  Procedure Laterality Date   APPENDECTOMY     BIOPSY  08/25/2020   Procedure: BIOPSY;  Surgeon: San Sandor GAILS, DO;  Location: [facility];  Service: Gastroenterology;;   COLONOSCOPY WITH PROPOFOL  N/A 08/25/2020   Procedure: COLONOSCOPY WITH PROPOFOL ;  Surgeon: San Sandor GAILS, DO;  Location: [facility];  Service: Gastroenterology;  Laterality: N/A;   DILATATION & CURETTAGE/HYSTEROSCOPY WITH MYOSURE N/A 04/15/2017   Procedure: DILATATION & CURETTAGE/HYSTEROSCOPY WITH MYOSURE POLYPECTOMY;  Surgeon: Curlene Agent, MD;  Location: [facility];  Service: Gynecology;   Laterality: N/A;   DILATATION & CURETTAGE/HYSTEROSCOPY WITH MYOSURE N/A 06/18/2019   Procedure: DILATATION & CURETTAGE/HYSTEROSCOPY WITH  MYOSURE;  Surgeon: Curlene Agent, MD;  Location: [facility];  Service: Gynecology;  Laterality: N/A;   DILATATION & CURETTAGE/HYSTEROSCOPY WITH MYOSURE N/A 06/07/2023   Procedure: DILATATION & CURETTAGE/HYSTEROSCOPY WITH  MYOSURE;  Surgeon: Curlene Agent, MD;  Location: [facility];  Service: Gynecology;  Laterality: N/A;  POSSIBLE MYOSURE [facility]   ESOPHAGOGASTRODUODENOSCOPY (EGD) WITH PROPOFOL  N/A 08/25/2020   Procedure: ESOPHAGOGASTRODUODENOSCOPY (EGD) WITH PROPOFOL ;  Surgeon: San Sandor GAILS, DO;  Location: [facility];  Service: Gastroenterology;  Laterality: N/A;   EYE SURGERY     cataracts removed   INCONTINENCE SURGERY     PILONIDAL CYST EXCISION     x2   POLYPECTOMY  08/25/2020   Procedure: POLYPECTOMY;  Surgeon: San Sandor GAILS, DO;  Location: [facility];  Service: Gastroenterology;;   repair of toe laceration     dermabond to right big toe right foot   repair of torn ligament right leg     patient denies this surgery   right foot fracture     cast only   TONSILLECTOMY     WISDOM TOOTH EXTRACTION     Patient Active Problem List   Diagnosis Date Noted  Chronic constipation 11/24/2023   Leg swelling 11/24/2023   Urgency of urination 09/26/2023   Vertigo 08/19/2023   Swimmer's ear 08/19/2023   Rash and nonspecific skin eruption 08/19/2023   Chronic pain of both knees 08/17/2023   Thrush, oral 07/07/2023   Female climacteric state 05/14/2023   Pre-op exam 05/14/2023   Class 3 severe obesity due to excess calories with body mass index (BMI) of 50.0 to 59.9 in adult 03/25/2023   Primary hypothyroidism 03/25/2023   Otalgia, right ear 03/25/2023   Vitamin D  deficiency 02/06/2023   Vitamin B12 deficiency 02/06/2023   Urinary frequency 05/27/2022   Leukocytes in urine 05/27/2022   Costochondritis 05/14/2022   Genetic testing 03/08/2021    Family history of ovarian cancer 02/16/2021   Uncontrolled type 2 diabetes mellitus with hyperglycemia (HCC) 02/07/2021   Essential hypertension, benign 02/07/2021   Gastritis and gastroduodenitis    Nausea without vomiting    Adenomatous polyp of ascending colon    Adenomatous polyp of transverse colon    Adenomatous polyp of sigmoid colon    Rectal polyp    Adenomatous polyp of descending colon    Gall bladder pain 11/04/2019   Class 3 severe obesity due to excess calories with serious comorbidity and body mass index (BMI) of 60.0 to 69.9 in adult 08/13/2019   Nasal congestion 03/10/2019   Sinus pressure 03/10/2019   Hyperglycemia 01/22/2019   Hypertriglyceridemia 01/22/2019   Combined forms of age-related cataract of left eye 09/10/2017   Musculoskeletal neck pain 07/24/2017   Migraine without aura and without status migrainosus, not intractable 11/12/2016   Encounter for preventative adult health care exam with abnormal findings 09/01/2015   Sinusitis, chronic 09/01/2015   Cough 09/01/2015   Morbid obesity with BMI of 50.0-59.9, adult (HCC) 11/18/2014   RUQ pain 10/27/2014   Urinary incontinence 09/17/2013   Acute sinus infection 10/16/2012   Paresthesias 09/22/2012   Sleep apnea, obstructive 09/22/2012   Elevated BP 07/29/2012   Fatigue 07/10/2012   Anxiety 06/02/2012   Closed head injury 06/02/2012   Depression 06/02/2012   Facial pain 06/02/2012   Leg cramps 04/15/2011   Edema 12/21/2010   Chronic pain due to trauma 03/15/2007   Extrinsic asthma 03/15/2007   INSOMNIA, CHRONIC 11/01/2006   Allergic rhinitis 11/01/2006   DYSPEPSIA, CHRONIC 11/01/2006   Head injury 11/01/2006    PCP: Catheryn Slocumb MD  REFERRING PROVIDER:   Sheril Coy, MD    REFERRING DIAG: M17.0 (ICD-10-CM) - Degenerative arthritis of knee, bilateral   THERAPY DIAG:  Bilateral chronic knee pain  Muscle weakness (generalized)  Abnormal posture  Other abnormalities of gait and  mobility  Rationale for Evaluation and Treatment: Rehabilitation  ONSET DATE: 1 year  SUBJECTIVE:   SUBJECTIVE STATEMENT: Pt reports significant progress since starting PT. She has been working on her walking and trying to straighten her knees more. Continues to wear either lidocaine  or diclofenac patches.  POOL ACCESS:  Considering membership here at [facility] well for pool access.   eval: Want to avoid having TKR.  Bone spurs are on the back of my knee caps.  Twisted my knee about 1 years ago.  Have been using rollator on and off for past 6 months. Can't straighten my knee. Gel shots have helped.  Just had injections last week.  Have a few more scheduled.  Had a surgery in last few months and I have been somewhat immobile not moving much at all.. Sleep for about 4 hours before waking  PERTINENT HISTORY: TBI 2008/2014 PAIN:  Are you having pain? Yes: NPRS scale: current 3-4/10 Pain location: bilat knee joint, hips Pain description: ache Aggravating factors: weight bearing Relieving factors: lidocaine  patches, OTC meds, mag cream  PRECAUTIONS: None  RED FLAGS: None   WEIGHT BEARING RESTRICTIONS: No  FALLS:  Has patient fallen in last 6 months? No tripped walking into the house  LIVING ENVIRONMENT: Lives with: lives with their family Lives in: House/apartment Stairs: 6 step into home Has following equipment at home: Single point cane and rollator and electric scooterlift chair, shower bench  OCCUPATION: disables  PLOF: Independent uses shower benches  PATIENT GOALS: build stamina, increase strength  NEXT MD VISIT: Friday  OBJECTIVE:  Note: Objective measures were completed at Evaluation unless otherwise noted.  DIAGNOSTIC FINDINGS: none in chart  PATIENT SURVEYS:  LEFS 12/80  COGNITION: Overall cognitive status: Within functional limits for tasks assessed     SENSATION: WFL   MUSCLE LENGTH: Hamstrings:shortened bilaterally   POSTURE: rounded shoulders,  forward head, and knee flexed trunk forward flexed.  PALPATION: Moderate TTP about knees bilat joint lines medial> Lateral  LOWER EXTREMITY ROM:  Active ROM Right eval Left eval R / L 11/08/23 R/L 12/04/23  Hip flexion      Hip extension      Hip abduction      Hip adduction      Hip internal rotation      Hip external rotation      Knee flexion 100d 103 98 / 100 93/93  Knee extension -40 -34 -38 / -30 28deg/19deg  Ankle dorsiflexion   Wfl / wfl 5deg/7deg  Ankle plantarflexion      Ankle inversion      Ankle eversion       (Blank rows = not tested)  LOWER EXTREMITY MMT:  HD (lb) Tested in sitting Right eval Left eval R / L 11/08/23 R/L 12/04/23  Hip flexion 44.4 45.9    Hip extension      Hip abduction 27.1 25.9    Hip adduction      Hip internal rotation      Hip external rotation      Knee flexion      Knee extension 33.1 25.6 32.4 / 35.2 48.6/48.9  Ankle dorsiflexion      Ankle plantarflexion      Ankle inversion      Ankle eversion       (Blank rows = not tested)   FUNCTIONAL TESTS:  Timed up and go (TUG): 23.91 STS transfers: heavy ue support from pool bench; forward momentum with ue elevated   12/04/23: TUG: 14.5seconds- No AD 5xSTS: 17.69 (used rocking/momentum)  GAIT: Distance walked: 100 ft Assistive device utilized: rollator Level of assistance: SBA Comments: bilateral flex contractures: no heel strike, sliding on toes when advancing LE during swing, forward flex trunk posture   11/08/23: 400 ft x 2  using rollator   Forward hip flex, knee flex forearms intermittently on handles, continues to slide toes acroos floor to advance le but is able to clear foot 1/3 of time with cues  TREATMENT   [facility] Adult PT Treatment:                                                DATE: 12/04/23  Updated ROM  Updated strength TUG  test 5xSTS test Goal review  Seated ankle and knee AROM Ambulation x240ft (1 seated break) Nu-step x32min L4    [facility] Adult PT Treatment:                                                DATE: 11/18/23 Pt seen for aquatic therapy today.  Treatment took place in water 3.5-4.75 ft in depth at the [facility] pool. Temp of water was 91.  Pt entered and exited the pool via stairs using step-to pattern with heavy UE on hand rail.  * Yellow hand floats under water:  walking forward/backward - multiple laps, cues for heel/toe and toe/ heel (does better facing lap pool) *side stepping yellow hand floats and add/abdct-> into wide squat  * forward walking kick * UE On  * hands on bench in water:  plank with alternating hip extension x 10-> hip abdct/add x 10 * seated on bench: alternating LAQ with DF x 10;  clams x 10 * bil heels off of step for gastroc stretch  * L stretch with UE on rails  * forward step up/retro step down x 5 reps each LE; cues to not circumduct LE onto step * R/L quad stretch with foot on 2nd step behind her x 20s each x 2 reps    PATIENT EDUCATION:  Education details: aquatic therapy progression/ modification Person educated: Patient Education method: Explanation Education comprehension: verbalized understanding  HOME EXERCISE PROGRAM: AQUATIC Access Code: [email or web address] URL: [email or web address] Date: 11/04/2023 Prepared by: [facility] [address] This aquatic home exercise program from [employer] utilizes pictures from land based exercises, but has been adapted prior to lamination and issuance.     ASSESSMENT:  CLINICAL IMPRESSION:  Pt improved in objective measures including Tug test, ROM, and hand dyno strength measures. Knee flexion was decreased today, likely due to increased edema as pt reported forgetting to take her fluid pill this morning. Although she has improved in knee extension ROM, she does still demonstrate a  significant deficit in full range. With ambulation, she does stand too far away from the device. Discussed height of rollator with pt as she had raised it since previous land session. Will need to be lowered for ideal elbow position. Ambulated 2104ft in clinic using rollator with single seated rest break halfway through. During last 24ft, she experienced some sharp pulling in L calf area. Followed up with some gentle AROM stretching which helped decrease this. Pt able to trial nu-step today at end of session with good tolerance in order to work on ROM. She will benefit from additional PT to improve functional capacity for ADLS in alignment with goals.      Eval: Patient is a 58 y.o. f who was seen today for physical therapy evaluation and treatment for bilat knee OA. She presents today with husband assisting she is using a rollator.  May require wc assistance for therapy sessions.  Exam reveals significant bilateral knee flex contractures, limited knee  flex, strength deficits, balance and proprioception deficits. Her gait is greatly impeded as she is unable to strike her heels and doesn't clear her feet from the floor/shuffles. She is a high fall risk due to posture and gait deficits.  She will benefit from skilled physical therapy both aquatic and land.  Will plan on focusing initially in aquatics on pain management, movement toleration improving ROM and strengthening then progress fairly quickly to alternating with land. Goals to improve all deficits which are significantly affecting her functional mobility and ADL's  OBJECTIVE IMPAIRMENTS: Abnormal gait, decreased activity tolerance, decreased balance, decreased coordination, decreased endurance, decreased knowledge of condition, decreased knowledge of use of DME, decreased mobility, difficulty walking, decreased ROM, decreased strength, impaired flexibility, postural dysfunction, obesity, and pain.   ACTIVITY LIMITATIONS: carrying, lifting, bending,  sitting, standing, squatting, sleeping, stairs, transfers, hygiene/grooming, and locomotion level  PARTICIPATION LIMITATIONS: meal prep, cleaning, laundry, driving, shopping, community activity, occupation, and yard work  PERSONAL FACTORS: Behavior pattern, Fitness, Past/current experiences, and Time since onset of injury/illness/exacerbation are also affecting patient's functional outcome.   REHAB POTENTIAL: Good  CLINICAL DECISION MAKING: Evolving/moderate complexity  EVALUATION COMPLEXITY: Moderate   GOALS: Goals reviewed with patient? Yes  SHORT TERM GOALS: Target date: 10/11/23 Pt will tolerate full aquatic sessions consistently without increase in pain and with improving function to demonstrate good toleration and effectiveness of intervention.  Baseline: Goal status:Met 10/14/23  2.  Pt will tolerate walking to and from setting and engaging in aquatic therapy session without excessive fatigue or increase in pain to demonstrate improved toleration to activity. Baseline: unable at eval Goal status: Met - 11/04/23  3.  Pt will be indep with stair negotiation into and out of pool consistently using hand rail Baseline:  Goal status: Met 10/14/23    4. Pt to verbalize understanding of the importance of exercise/movement on overall well being.    Baseline: immobile for up towards 1 year    Goal status:Met 10/14/23   LONG TERM GOALS: Target date: 11/08/23  Pt to improve on LEFS by at least 9 point to demonstrate statistically significant Improvement in function. Baseline: 12/80; 21/80 Goal status: Met 11/08/23  2.  Pt will report decrease in pain by at least 50% for improved toleration to activity/quality of life and to demonstrate improved management of pain. Baseline:  Goal status: MET (75% 12/04/23)  3.  Pt will improve strength in knee extension by at least 10 lbs to demonstrate improved overall physical function Baseline:  Goal status: MET 12/04/23  4.  Pt to improve bilateral  knee extension by 50% for improved gait. Baseline:  Goal status: In progress 12/04/23    5. Pt will be indep with final HEP's (land and aquatic as appropriate) for continued management of condition    Baseline:    Goal status: In progress -8   6. Pt will improve bilateral ankle DF to 10d to improve heel strike with gait.   Baseline:<neutral   Goal Status: IN PROGRESS 8/27    7.Pt will improve standing posture as demonstrated by standing with rollator and walking with hands on bars at all times   Baseline: 50-75% of time leaning on forearms.   Goal Status: New   8. Pt will amb 1000 ft using ad in upright position striking heels 50% of time.   Baseline: 400-500 ft leaning on forearms at least 50% of time; heel strike  PLAN:  PT FREQUENCY: 2x/week  PT DURATION: 8 weeks  PLANNED INTERVENTIONS: 02835- PT  Re-evaluation, 97110-Therapeutic exercises, 97530- Therapeutic activity, [id number]- Neuromuscular re-education, [phone]- Self Care, 02859- Manual therapy, [phone]- Gait training, [phone]- Orthotic Initial, [phone]- Aquatic Therapy, [phone]- Ionotophoresis 4mg /ml Dexamethasone , Patient/Family education, Balance training, Stair training, Taping, Dry Needling, Joint mobilization, DME instructions, Cryotherapy, and Moist heat  PLAN FOR NEXT SESSION: alternate land/aquatics for Knee ROM/strengthening; gait; balance an proprioception; Review HEP  Asberry Rodes, PTA  12/04/23 10:02 AM [facility] [address] [place], [place], [address] Phone: [phone]   Fax:  [phone]    Date of referral: 08/09/23 Referring provider: Maude Graves MD Referring diagnosis? Bilat knee OA Treatment diagnosis? (if different than referring diagnosis) no  What was this (referring dx) caused by? Arthritis  Nature of Condition: Chronic (continuous duration > 3 months)   Laterality: Both  Current Functional Measure Score: LEFS 21/80  Objective measurements  identify impairments when they are compared to normal values, the uninvolved extremity, and prior level of function.  [x]  Yes  []  No  Objective assessment of functional ability: Severe functional limitations   Briefly describe symptoms: Bilat knee pain, flexion contractures, gait abnormality, strength deficits  How did symptoms start: OA  Average pain intensity:  Last 24 hours: 3/10  Past week: 3/10  How often does the pt experience symptoms? intermittent  How much have the symptoms interfered with usual daily activities? Quite a bit  How has condition changed since care began at this facility? Better  In general, how is the patients overall health? Good

## 2023-12-05 ENCOUNTER — Ambulatory Visit (HOSPITAL_BASED_OUTPATIENT_CLINIC_OR_DEPARTMENT_OTHER): Payer: Self-pay | Admitting: Physical Therapy

## 2023-12-10 ENCOUNTER — Encounter (HOSPITAL_BASED_OUTPATIENT_CLINIC_OR_DEPARTMENT_OTHER)

## 2023-12-13 ENCOUNTER — Ambulatory Visit (HOSPITAL_BASED_OUTPATIENT_CLINIC_OR_DEPARTMENT_OTHER)

## 2023-12-16 ENCOUNTER — Ambulatory Visit (HOSPITAL_BASED_OUTPATIENT_CLINIC_OR_DEPARTMENT_OTHER): Admitting: Physical Therapy

## 2023-12-18 ENCOUNTER — Encounter

## 2023-12-19 ENCOUNTER — Encounter (HOSPITAL_BASED_OUTPATIENT_CLINIC_OR_DEPARTMENT_OTHER)

## 2023-12-19 ENCOUNTER — Institutional Professional Consult (permissible substitution) (HOSPITAL_BASED_OUTPATIENT_CLINIC_OR_DEPARTMENT_OTHER): Admitting: Family

## 2023-12-19 DIAGNOSIS — M170 Bilateral primary osteoarthritis of knee: Secondary | ICD-10-CM | POA: Diagnosis not present

## 2023-12-19 DIAGNOSIS — M1712 Unilateral primary osteoarthritis, left knee: Secondary | ICD-10-CM | POA: Diagnosis not present

## 2023-12-19 DIAGNOSIS — M1711 Unilateral primary osteoarthritis, right knee: Secondary | ICD-10-CM | POA: Diagnosis not present

## 2023-12-23 ENCOUNTER — Encounter (HOSPITAL_BASED_OUTPATIENT_CLINIC_OR_DEPARTMENT_OTHER)

## 2023-12-23 ENCOUNTER — Ambulatory Visit (HOSPITAL_BASED_OUTPATIENT_CLINIC_OR_DEPARTMENT_OTHER): Payer: Self-pay | Admitting: Physical Therapy

## 2023-12-27 ENCOUNTER — Ambulatory Visit (HOSPITAL_BASED_OUTPATIENT_CLINIC_OR_DEPARTMENT_OTHER): Admitting: Physical Therapy

## 2023-12-30 ENCOUNTER — Ambulatory Visit (HOSPITAL_BASED_OUTPATIENT_CLINIC_OR_DEPARTMENT_OTHER)

## 2023-12-31 ENCOUNTER — Other Ambulatory Visit: Payer: Self-pay | Admitting: Internal Medicine

## 2024-01-02 ENCOUNTER — Encounter (HOSPITAL_BASED_OUTPATIENT_CLINIC_OR_DEPARTMENT_OTHER): Admitting: Physical Therapy

## 2024-01-07 ENCOUNTER — Ambulatory Visit (HOSPITAL_BASED_OUTPATIENT_CLINIC_OR_DEPARTMENT_OTHER): Attending: Orthopaedic Surgery | Admitting: Physical Therapy

## 2024-01-07 ENCOUNTER — Encounter (HOSPITAL_BASED_OUTPATIENT_CLINIC_OR_DEPARTMENT_OTHER): Payer: Self-pay | Admitting: Physical Therapy

## 2024-01-07 DIAGNOSIS — M25561 Pain in right knee: Secondary | ICD-10-CM | POA: Diagnosis not present

## 2024-01-07 DIAGNOSIS — R293 Abnormal posture: Secondary | ICD-10-CM | POA: Insufficient documentation

## 2024-01-07 DIAGNOSIS — R2689 Other abnormalities of gait and mobility: Secondary | ICD-10-CM | POA: Diagnosis not present

## 2024-01-07 DIAGNOSIS — M6281 Muscle weakness (generalized): Secondary | ICD-10-CM | POA: Diagnosis not present

## 2024-01-07 DIAGNOSIS — M25562 Pain in left knee: Secondary | ICD-10-CM | POA: Insufficient documentation

## 2024-01-07 DIAGNOSIS — G8929 Other chronic pain: Secondary | ICD-10-CM | POA: Insufficient documentation

## 2024-01-07 NOTE — Therapy (Signed)
 OUTPATIENT PHYSICAL THERAPY LOWER EXTREMITY TREATMENT Patient Name: [name] Wright MRN: [id number] DOB:[birth year], 58 y.o., female Today's Date: 01/07/2024  END OF SESSION:  PT End of Session - 01/07/24 1108     Visit Number 20    Date for Recertification  02/07/24    Authorization Type [employer] [facility]    Authorization Time Period [facility]  $20 Copay  4 visits approved  12/16/23-01/13/24  Confirmation#: [id number]  JG    Authorization - Visit Number 1    Authorization - Number of Visits 4    Progress Note Due on Visit 26    PT Start Time 1016    PT Stop Time 1110    PT Time Calculation (min) 54 min    Activity Tolerance Patient tolerated treatment well    Behavior During Therapy WFL for tasks assessed/performed               Past Medical History:  Diagnosis Date   Acute cystitis    Allergic rhinitis    Childhood asthma    no problems as adult - no inhaler   Chronic insomnia    Chronic pain due to trauma 02/2001   closed head trauma - Followed by Dr Elspeth Mems [facility]   Complication of anesthesia    Dyspepsia    Family history of ovarian cancer    Head trauma 02/24/2001   closed   History of kidney stones    passed stone - no surgery required   Hypertension    Hypothyroidism    PONV (postoperative nausea and vomiting)    Pre-diabetes    Sleep apnea    Pt denies   SVD (spontaneous vaginal delivery)    fetal demise at 5 months   Past Surgical History:  Procedure Laterality Date   APPENDECTOMY     BIOPSY  08/25/2020   Procedure: BIOPSY;  Surgeon: San Sandor GAILS, DO;  Location: [facility];  Service: Gastroenterology;;   COLONOSCOPY WITH PROPOFOL  N/A 08/25/2020   Procedure: COLONOSCOPY WITH PROPOFOL ;  Surgeon: San Sandor GAILS, DO;  Location: [facility];  Service: Gastroenterology;  Laterality: N/A;   DILATATION & CURETTAGE/HYSTEROSCOPY WITH MYOSURE N/A 04/15/2017   Procedure: DILATATION & CURETTAGE/HYSTEROSCOPY WITH MYOSURE  POLYPECTOMY;  Surgeon: Curlene Agent, MD;  Location: [facility];  Service: Gynecology;  Laterality: N/A;   DILATATION & CURETTAGE/HYSTEROSCOPY WITH MYOSURE N/A 06/18/2019   Procedure: DILATATION & CURETTAGE/HYSTEROSCOPY WITH  MYOSURE;  Surgeon: Curlene Agent, MD;  Location: [facility];  Service: Gynecology;  Laterality: N/A;   DILATATION & CURETTAGE/HYSTEROSCOPY WITH MYOSURE N/A 06/07/2023   Procedure: DILATATION & CURETTAGE/HYSTEROSCOPY WITH  MYOSURE;  Surgeon: Curlene Agent, MD;  Location: [facility] OR;  Service: Gynecology;  Laterality: N/A;  POSSIBLE MYOSURE [facility]   ESOPHAGOGASTRODUODENOSCOPY (EGD) WITH PROPOFOL  N/A 08/25/2020   Procedure: ESOPHAGOGASTRODUODENOSCOPY (EGD) WITH PROPOFOL ;  Surgeon: San Sandor GAILS, DO;  Location: [facility];  Service: Gastroenterology;  Laterality: N/A;   EYE SURGERY     cataracts removed   INCONTINENCE SURGERY     PILONIDAL CYST EXCISION     x2   POLYPECTOMY  08/25/2020   Procedure: POLYPECTOMY;  Surgeon: San Sandor GAILS, DO;  Location: [facility];  Service: Gastroenterology;;   repair of toe laceration     dermabond to right big toe right foot   repair of torn ligament right leg     patient denies this surgery   right foot fracture     cast only   TONSILLECTOMY     WISDOM  TOOTH EXTRACTION     Patient Active Problem List   Diagnosis Date Noted   Chronic constipation 11/24/2023   Leg swelling 11/24/2023   Urgency of urination 09/26/2023   Vertigo 08/19/2023   Swimmer's ear 08/19/2023   Rash and nonspecific skin eruption 08/19/2023   Chronic pain of both knees 08/17/2023   Thrush, oral 07/07/2023   Female climacteric state 05/14/2023   Pre-op exam 05/14/2023   Class 3 severe obesity due to excess calories with body mass index (BMI) of 50.0 to 59.9 in adult 03/25/2023   Primary hypothyroidism 03/25/2023   Otalgia, right ear 03/25/2023   Vitamin D  deficiency 02/06/2023   Vitamin B12 deficiency 02/06/2023   Urinary frequency 05/27/2022   Leukocytes in  urine 05/27/2022   Costochondritis 05/14/2022   Genetic testing 03/08/2021   Family history of ovarian cancer 02/16/2021   Uncontrolled type 2 diabetes mellitus with hyperglycemia (HCC) 02/07/2021   Essential hypertension, benign 02/07/2021   Gastritis and gastroduodenitis    Nausea without vomiting    Adenomatous polyp of ascending colon    Adenomatous polyp of transverse colon    Adenomatous polyp of sigmoid colon    Rectal polyp    Adenomatous polyp of descending colon    Gall bladder pain 11/04/2019   Class 3 severe obesity due to excess calories with serious comorbidity and body mass index (BMI) of 60.0 to 69.9 in adult 08/13/2019   Nasal congestion 03/10/2019   Sinus pressure 03/10/2019   Hyperglycemia 01/22/2019   Hypertriglyceridemia 01/22/2019   Combined forms of age-related cataract of left eye 09/10/2017   Musculoskeletal neck pain 07/24/2017   Migraine without aura and without status migrainosus, not intractable 11/12/2016   Encounter for preventative adult health care exam with abnormal findings 09/01/2015   Sinusitis, chronic 09/01/2015   Cough 09/01/2015   Morbid obesity with BMI of 50.0-59.9, adult (HCC) 11/18/2014   RUQ pain 10/27/2014   Urinary incontinence 09/17/2013   Acute sinus infection 10/16/2012   Paresthesias 09/22/2012   Sleep apnea, obstructive 09/22/2012   Elevated BP 07/29/2012   Fatigue 07/10/2012   Anxiety 06/02/2012   Closed head injury 06/02/2012   Depression 06/02/2012   Facial pain 06/02/2012   Leg cramps 04/15/2011   Edema 12/21/2010   Chronic pain due to trauma 03/15/2007   Extrinsic asthma 03/15/2007   INSOMNIA, CHRONIC 11/01/2006   Allergic rhinitis 11/01/2006   DYSPEPSIA, CHRONIC 11/01/2006   Head injury 11/01/2006    PCP: Catheryn Slocumb MD  REFERRING PROVIDER:   Sheril Coy, MD    REFERRING DIAG: M17.0 (ICD-10-CM) - Degenerative arthritis of knee, bilateral   THERAPY DIAG:  Bilateral chronic knee pain  Muscle  weakness (generalized)  Abnormal posture  Other abnormalities of gait and mobility  Rationale for Evaluation and Treatment: Rehabilitation  ONSET DATE: 1 year  SUBJECTIVE:   SUBJECTIVE STATEMENT: Pt reports a fall ~ 4 weeks ago as she was pushing a rolling chair rather than using her rollator.  [facility]. Took an Advil  and ms relaxer prior to session. Will be getting gel shots  POOL ACCESS: [facility]   eval: Want to avoid having TKR.  Bone spurs are on the back of my knee caps.  Twisted my knee about 1 years ago.  Have been using rollator on and off for past 6 months. Can't straighten my knee. Gel shots have helped.  Just had injections last week.  Have a few more scheduled.  Had  a surgery in last few months and I have been somewhat immobile not moving much at all.. Sleep for about 4 hours before waking  PERTINENT HISTORY: TBI 2008/2014 PAIN:  Are you having pain? Yes: NPRS scale: current 3-4/10 Pain location: bilat knee joint, hips Pain description: ache Aggravating factors: weight bearing Relieving factors: lidocaine  patches, OTC meds, mag cream  PRECAUTIONS: None  RED FLAGS: None   WEIGHT BEARING RESTRICTIONS: No  FALLS:  Has patient fallen in last 6 months? No tripped walking into the house  LIVING ENVIRONMENT: Lives with: lives with their family Lives in: House/apartment Stairs: 6 step into home Has following equipment at home: Single point cane and rollator and electric scooterlift chair, shower bench  OCCUPATION: disables  PLOF: Independent uses shower benches  PATIENT GOALS: build stamina, increase strength  NEXT MD VISIT: Friday  OBJECTIVE:  Note: Objective measures were completed at Evaluation unless otherwise noted.  DIAGNOSTIC FINDINGS: none in chart  PATIENT SURVEYS:  LEFS 12/80  COGNITION: Overall cognitive status: Within functional limits for tasks  assessed     SENSATION: WFL   MUSCLE LENGTH: Hamstrings:shortened bilaterally   POSTURE: rounded shoulders, forward head, and knee flexed trunk forward flexed.  PALPATION: Moderate TTP about knees bilat joint lines medial> Lateral  LOWER EXTREMITY ROM:  Active ROM Right eval Left eval R / L 11/08/23 R/L 12/04/23 R/L 01/07/24  Hip flexion       Hip extension       Hip abduction       Hip adduction       Hip internal rotation       Hip external rotation       Knee flexion 100d 103 98 / 100 93/93 86/94  Knee extension -40 -34 -38 / -30 28deg/19deg -27/-23  Ankle dorsiflexion   Wfl / wfl 5deg/7deg   Ankle plantarflexion       Ankle inversion       Ankle eversion        (Blank rows = not tested)  LOWER EXTREMITY MMT:  HD (lb) Tested in sitting Right eval Left eval R / L 11/08/23 R/L 12/04/23   Hip flexion 44.4 45.9     Hip extension       Hip abduction 27.1 25.9     Hip adduction       Hip internal rotation       Hip external rotation       Knee flexion       Knee extension 33.1 25.6 32.4 / 35.2 48.6/48.9   Ankle dorsiflexion       Ankle plantarflexion       Ankle inversion       Ankle eversion        (Blank rows = not tested)   FUNCTIONAL TESTS:  Timed up and go (TUG): 23.91 STS transfers: heavy ue support from pool bench; forward momentum with ue elevated   12/04/23: TUG: 14.5seconds- No AD 5xSTS: 17.69 (used rocking/momentum)  GAIT: Distance walked: 100 ft Assistive device utilized: rollator Level of assistance: SBA Comments: bilateral flex contractures: no heel strike, sliding on toes when advancing LE during swing, forward flex trunk posture   11/08/23: 400 ft x 2  using rollator   Forward hip flex, knee flex forearms intermittently on handles, continues to slide toes acroos floor to advance le but is able to clear foot 1/3 of time with cues  TREATMENT  [facility] Adult PT Treatment:                                                DATE: 01/07/24  Re-assessment/recert Self care: importance of continuing with HEP land and water; pool access; safety with amb (using rollator not wheeled chairs)  Pt seen for aquatic therapy today.  Treatment took place in water 3.5-4.75 ft in depth at the [facility] pool. Temp of water was 91.  Pt entered and exited the pool via stairs using step-to pattern with heavy UE on hand rail.  *walking forward, back and side stepping *seated on bench: flutter kicking; LAQ; hip add/abd 3 sets 20 slow/20 fast; cycling * R/L quad stretch/hamstring and gastroc stretch with foot on 2nd step behind her x 20s each x 2 reps  *forward march with kick    [facility] Adult PT Treatment:                                                DATE: 12/04/23  Updated ROM  Updated strength TUG test 5xSTS test Goal review  Seated ankle and knee AROM Ambulation x235ft (1 seated break) Nu-step x59min L4    [facility] Adult PT Treatment:                                                DATE: 11/18/23 Pt seen for aquatic therapy today.  Treatment took place in water 3.5-4.75 ft in depth at the [facility] pool. Temp of water was 91.  Pt entered and exited the pool via stairs using step-to pattern with heavy UE on hand rail.  * Yellow hand floats under water:  walking forward/backward - multiple laps, cues for heel/toe and toe/ heel (does better facing lap pool) *side stepping yellow hand floats and add/abdct-> into wide squat  * forward walking kick * UE On  * hands on bench in water:  plank with alternating hip extension x 10-> hip abdct/add x 10 * seated on bench: alternating LAQ with DF x 10;  clams x 10 * bil heels off of step for gastroc stretch  * L stretch with UE on rails  * forward step up/retro step down x 5 reps each LE; cues to not circumduct LE onto step * R/L quad stretch with foot on 2nd  step behind her x 20s each x 2 reps    PATIENT EDUCATION:  Education details: aquatic therapy progression/ modification Person educated: Patient Education method: Explanation Education comprehension: verbalized understanding  HOME EXERCISE PROGRAM: AQUATIC Access Code: [email or web address] URL: [email or web address] Date: 11/04/2023 Prepared by: [facility] [address] This aquatic home exercise program from [employer] utilizes pictures from land based exercises, but has been adapted prior to lamination and issuance.     ASSESSMENT:  CLINICAL IMPRESSION: Pt return to therapy after > 1 month hiatus. She reports having a fall and then having insurance difficulties. Pt reports doing some exercises in that time but mostly just walking.  She has had a regression of knee ROM since last measured with baseline being very limited.  She continues to have poor gait quality using rollator and walking on toes in a forward hip flex position and knees flexed (bilateral gastroc, hamstring and hip flex shortening) although is able to amb distance >400 ft to and from this setting. Fall risk remains high.  See below for progression pre fall as many goals met. She has gained pool access at the [facility]. Unfortunately her insurance will not  authorize any more therapy session beyond what we have ( 4 sessions through 10/6).  She has another aquatic session scheduled (plz check and ensure indep with HEP) and is wait listed for a final session preferably land based for final instruction on HEP.   Pt improved in objective measures including Tug test, ROM, and hand dyno strength measures. Knee flexion was decreased today, likely due to increased edema as pt reported forgetting to take her fluid pill this morning. Although she has improved in knee extension ROM, she does still demonstrate a significant deficit in full range. With ambulation, she does stand too far away from the device. Discussed height  of rollator with pt as she had raised it since previous land session. Will need to be lowered for ideal elbow position. Ambulated 229ft in clinic using rollator with single seated rest break halfway through. During last 10ft, she experienced some sharp pulling in L calf area. Followed up with some gentle AROM stretching which helped decrease this. Pt able to trial nu-step today at end of session with good tolerance in order to work on ROM. She will benefit from additional PT to improve functional capacity for ADLS in alignment with goals.      Eval: Patient is a 58 y.o. f who was seen today for physical therapy evaluation and treatment for bilat knee OA. She presents today with husband assisting she is using a rollator.  May require wc assistance for therapy sessions.  Exam reveals significant bilateral knee flex contractures, limited knee flex, strength deficits, balance and proprioception deficits. Her gait is greatly impeded as she is unable to strike her heels and doesn't clear her feet from the floor/shuffles. She is a high fall risk due to posture and gait deficits.  She will benefit from skilled physical therapy both aquatic and land.  Will plan on focusing initially in aquatics on pain management, movement toleration improving ROM and strengthening then progress fairly quickly to alternating with land. Goals to improve all deficits which are significantly affecting her functional mobility and ADL's  OBJECTIVE IMPAIRMENTS: Abnormal gait, decreased activity tolerance, decreased balance, decreased coordination, decreased endurance, decreased knowledge of condition, decreased knowledge of use of DME, decreased mobility, difficulty walking, decreased ROM, decreased strength, impaired flexibility, postural dysfunction, obesity, and pain.   ACTIVITY LIMITATIONS: carrying, lifting, bending, sitting, standing, squatting, sleeping, stairs, transfers, hygiene/grooming, and locomotion level  PARTICIPATION  LIMITATIONS: meal prep, cleaning, laundry, driving, shopping, community activity, occupation, and yard work  PERSONAL FACTORS: Behavior pattern, Fitness, Past/current experiences, and Time since onset of injury/illness/exacerbation are also affecting patient's functional outcome.   REHAB POTENTIAL: Good  CLINICAL DECISION MAKING: Evolving/moderate complexity  EVALUATION COMPLEXITY: Moderate   GOALS: Goals reviewed with patient? Yes  SHORT TERM GOALS: Target date: 10/11/23 Pt will tolerate full aquatic sessions consistently without increase in pain and with improving function to demonstrate good toleration and effectiveness of intervention.  Baseline: Goal status:Met 10/14/23  2.  Pt will tolerate walking to and from setting and engaging in aquatic therapy session without excessive fatigue or increase in pain to demonstrate improved  toleration to activity. Baseline: unable at eval Goal status: Met - 11/04/23  3.  Pt will be indep with stair negotiation into and out of pool consistently using hand rail Baseline:  Goal status: Met 10/14/23    4. Pt to verbalize understanding of the importance of exercise/movement on overall well being.    Baseline: immobile for up towards 1 year    Goal status:Met 10/14/23   LONG TERM GOALS: Target date: 11/08/23  Pt to improve on LEFS by at least 9 point to demonstrate statistically significant Improvement in function. Baseline: 12/80; 21/80 Goal status: Met 11/08/23  2.  Pt will report decrease in pain by at least 50% for improved toleration to activity/quality of life and to demonstrate improved management of pain. Baseline:  Goal status: MET (75% 12/04/23)  3.  Pt will improve strength in knee extension by at least 10 lbs to demonstrate improved overall physical function Baseline:  Goal status: MET 12/04/23  4.  Pt to improve bilateral knee extension by 50% for improved gait. Baseline:  Goal status: In progress 12/04/23    5. Pt will be indep with  final HEP's (land and aquatic as appropriate) for continued management of condition    Baseline:    Goal status: In progress -8   6. Pt will improve bilateral ankle DF to 10d to improve heel strike with gait.   Baseline:<neutral   Goal Status: IN PROGRESS 8/27    7.Pt will improve standing posture as demonstrated by standing with rollator and walking with hands on bars at all times   Baseline: 50-75% of time leaning on forearms.   Goal Status: New   8. Pt will amb 1000 ft using ad in upright position striking heels 50% of time.   Baseline: 400-500 ft leaning on forearms at least 50% of time; heel strike  PLAN:  PT FREQUENCY: 2 x week  PT DURATION: 4 weeks  PLANNED INTERVENTIONS: 97164- PT Re-evaluation, 97110-Therapeutic exercises, 97530- Therapeutic activity, [id number]- Neuromuscular re-education, 97535- Self Care, [id number]- Manual therapy, [id number]- Gait training, [phone]- Orthotic Initial, [phone]- Aquatic Therapy, [phone]- Ionotophoresis 4mg /ml Dexamethasone , Patient/Family education, Balance training, Stair training, Taping, Dry Needling, Joint mobilization, DME instructions, Cryotherapy, and Moist heat  PLAN FOR NEXT SESSION: alternate land/aquatics for Knee ROM/strengthening; gait; balance an proprioception; Review HEP  Ronal Foots) Devoiry Corriher MPT 01/07/24 12:33 PM [facility] [address] [place], [place], [address] Phone: [phone]   Fax:  [phone]     Date of referral: 08/09/23 Referring provider: Maude Graves MD Referring diagnosis? Bilat knee OA Treatment diagnosis? (if different than referring diagnosis) no  What was this (referring dx) caused by? Arthritis  Nature of Condition: Chronic (continuous duration > 3 months)   Laterality: Both  Current Functional Measure Score: LEFS 21/80  Objective measurements identify impairments when they are compared to normal values, the uninvolved extremity, and prior level of  function.  [x]  Yes  []  No  Objective assessment of functional ability: Severe functional limitations   Briefly describe symptoms: Bilat knee pain, flexion contractures, gait abnormality, strength deficits  How did symptoms start: OA  Average pain intensity:  Last 24 hours: 3/10  Past week: 3/10  How often does the pt experience symptoms? intermittent  How much have the symptoms interfered with usual daily activities? Quite a bit  How has condition changed since care began at this facility? Better  In general, how is the patients overall health? Good

## 2024-01-10 ENCOUNTER — Ambulatory Visit (HOSPITAL_BASED_OUTPATIENT_CLINIC_OR_DEPARTMENT_OTHER): Attending: Orthopaedic Surgery | Admitting: Physical Therapy

## 2024-01-10 DIAGNOSIS — M25561 Pain in right knee: Secondary | ICD-10-CM | POA: Diagnosis present

## 2024-01-10 DIAGNOSIS — G8929 Other chronic pain: Secondary | ICD-10-CM | POA: Diagnosis present

## 2024-01-10 DIAGNOSIS — M6281 Muscle weakness (generalized): Secondary | ICD-10-CM | POA: Insufficient documentation

## 2024-01-10 DIAGNOSIS — R293 Abnormal posture: Secondary | ICD-10-CM | POA: Diagnosis present

## 2024-01-10 DIAGNOSIS — M25562 Pain in left knee: Secondary | ICD-10-CM | POA: Insufficient documentation

## 2024-01-10 NOTE — Therapy (Signed)
 OUTPATIENT PHYSICAL THERAPY LOWER EXTREMITY TREATMENT Patient Name: [name] Wright MRN: [id number] DOB:1965-06-03, 58 y.o., female Today's Date: 01/10/2024  END OF SESSION:  PT End of Session - 01/10/24 1638     Visit Number 21    Date for Recertification  02/07/24    Authorization Type [facility]    Authorization Time Period [facility].  $20 Copay  4 visits approved  12/16/23-01/13/24  Confirmation#: [id number]  JG    Authorization - Visit Number 2    Authorization - Number of Visits 4    Progress Note Due on Visit 26    PT Start Time 1604    PT Stop Time 1642    PT Time Calculation (min) 38 min    Activity Tolerance Patient tolerated treatment well    Behavior During Therapy WFL for tasks assessed/performed                Past Medical History:  Diagnosis Date   Acute cystitis    Allergic rhinitis    Childhood asthma    no problems as adult - no inhaler   Chronic insomnia    Chronic pain due to trauma 02/2001   closed head trauma - Followed by Dr Elspeth Mems [facility]   Complication of anesthesia    Dyspepsia    Family history of ovarian cancer    Head trauma 02/24/2001   closed   History of kidney stones    passed stone - no surgery required   Hypertension    Hypothyroidism    PONV (postoperative nausea and vomiting)    Pre-diabetes    Sleep apnea    Pt denies   SVD (spontaneous vaginal delivery)    fetal demise at 5 months   Past Surgical History:  Procedure Laterality Date   APPENDECTOMY     BIOPSY  08/25/2020   Procedure: BIOPSY;  Surgeon: San Sandor GAILS, DO;  Location: [facility];  Service: Gastroenterology;;   COLONOSCOPY WITH PROPOFOL  N/A 08/25/2020   Procedure: COLONOSCOPY WITH PROPOFOL ;  Surgeon: San Sandor GAILS, DO;  Location: [facility];  Service: Gastroenterology;  Laterality: N/A;   DILATATION & CURETTAGE/HYSTEROSCOPY WITH MYOSURE N/A 04/15/2017   Procedure: DILATATION & CURETTAGE/HYSTEROSCOPY WITH MYOSURE  POLYPECTOMY;  Surgeon: Curlene Agent, MD;  Location: [facility];  Service: Gynecology;  Laterality: N/A;   DILATATION & CURETTAGE/HYSTEROSCOPY WITH MYOSURE N/A 06/18/2019   Procedure: DILATATION & CURETTAGE/HYSTEROSCOPY WITH  MYOSURE;  Surgeon: Curlene Agent, MD;  Location: [facility];  Service: Gynecology;  Laterality: N/A;   DILATATION & CURETTAGE/HYSTEROSCOPY WITH MYOSURE N/A 06/07/2023   Procedure: DILATATION & CURETTAGE/HYSTEROSCOPY WITH  MYOSURE;  Surgeon: Curlene Agent, MD;  Location: [facility] OR;  Service: Gynecology;  Laterality: N/A;  POSSIBLE MYOSURE [facility]   ESOPHAGOGASTRODUODENOSCOPY (EGD) WITH PROPOFOL  N/A 08/25/2020   Procedure: ESOPHAGOGASTRODUODENOSCOPY (EGD) WITH PROPOFOL ;  Surgeon: San Sandor GAILS, DO;  Location: [facility];  Service: Gastroenterology;  Laterality: N/A;   EYE SURGERY     cataracts removed   INCONTINENCE SURGERY     PILONIDAL CYST EXCISION     x2   POLYPECTOMY  08/25/2020   Procedure: POLYPECTOMY;  Surgeon: San Sandor GAILS, DO;  Location: [facility];  Service: Gastroenterology;;   repair of toe laceration     dermabond to right big toe right foot   repair of torn ligament right leg     patient denies this surgery   right foot fracture     cast only   TONSILLECTOMY  WISDOM TOOTH EXTRACTION     Patient Active Problem List   Diagnosis Date Noted   Chronic constipation 11/24/2023   Leg swelling 11/24/2023   Urgency of urination 09/26/2023   Vertigo 08/19/2023   Swimmer's ear 08/19/2023   Rash and nonspecific skin eruption 08/19/2023   Chronic pain of both knees 08/17/2023   Thrush, oral 07/07/2023   Female climacteric state 05/14/2023   Pre-op exam 05/14/2023   Class 3 severe obesity due to excess calories with body mass index (BMI) of 50.0 to 59.9 in adult [facility]) 03/25/2023   Primary hypothyroidism 03/25/2023   Otalgia, right ear 03/25/2023   Vitamin D  deficiency 02/06/2023   Vitamin B12 deficiency 02/06/2023   Urinary frequency 05/27/2022    Leukocytes in urine 05/27/2022   Costochondritis 05/14/2022   Genetic testing 03/08/2021   Family history of ovarian cancer 02/16/2021   Uncontrolled type 2 diabetes mellitus with hyperglycemia (HCC) 02/07/2021   Essential hypertension, benign 02/07/2021   Gastritis and gastroduodenitis    Nausea without vomiting    Adenomatous polyp of ascending colon    Adenomatous polyp of transverse colon    Adenomatous polyp of sigmoid colon    Rectal polyp    Adenomatous polyp of descending colon    Gall bladder pain 11/04/2019   Class 3 severe obesity due to excess calories with serious comorbidity and body mass index (BMI) of 60.0 to 69.9 in adult (HCC) 08/13/2019   Nasal congestion 03/10/2019   Sinus pressure 03/10/2019   Hyperglycemia 01/22/2019   Hypertriglyceridemia 01/22/2019   Combined forms of age-related cataract of left eye 09/10/2017   Musculoskeletal neck pain 07/24/2017   Migraine without aura and without status migrainosus, not intractable 11/12/2016   Encounter for preventative adult health care exam with abnormal findings 09/01/2015   Sinusitis, chronic 09/01/2015   Cough 09/01/2015   Morbid obesity with BMI of 50.0-59.9, adult (HCC) 11/18/2014   RUQ pain 10/27/2014   Urinary incontinence 09/17/2013   Acute sinus infection 10/16/2012   Paresthesias 09/22/2012   Sleep apnea, obstructive 09/22/2012   Elevated BP 07/29/2012   Fatigue 07/10/2012   Anxiety 06/02/2012   Closed head injury 06/02/2012   Depression 06/02/2012   Facial pain 06/02/2012   Leg cramps 04/15/2011   Edema 12/21/2010   Chronic pain due to trauma 03/15/2007   Extrinsic asthma 03/15/2007   INSOMNIA, CHRONIC 11/01/2006   Allergic rhinitis 11/01/2006   DYSPEPSIA, CHRONIC 11/01/2006   Head injury 11/01/2006    PCP: Catheryn Slocumb MD  REFERRING PROVIDER:   Sheril Coy, MD    REFERRING DIAG: M17.0 (ICD-10-CM) - Degenerative arthritis of knee, bilateral   THERAPY DIAG:  Bilateral chronic knee  pain  Muscle weakness (generalized)  Abnormal posture  Rationale for Evaluation and Treatment: Rehabilitation  ONSET DATE: 1 year  SUBJECTIVE:   SUBJECTIVE STATEMENT: Pt reports she has been using massage gun daily.  [facility]  Will be getting gel shots.   POOL ACC[facility]: [facility]   eval: Want to avoid having TKR.  Bone spurs are on the back of my knee caps.  Twisted my knee about 1 years ago.  Have been using rollator on and off for past 6 months. Can't straighten my knee. Gel shots have helped.  Just had injections last week.  Have a few more scheduled.  Had a surgery in last few months and I have been somewhat immobile not moving much at all.. Sleep for about 4 hours  before waking  PERTINENT HISTORY: TBI 2008/2014 PAIN:  Are you having pain? Yes: NPRS scale: current 2-3/10 Pain location: bilat knee joint, hips Pain description: ache Aggravating factors: weight bearing Relieving factors: lidocaine  patches, OTC meds, mag cream  PRECAUTIONS: None  RED FLAGS: None   WEIGHT BEARING RESTRICTIONS: No  FALLS:  Has patient fallen in last 6 months? No tripped walking into the house  LIVING ENVIRONMENT: Lives with: lives with their family Lives in: House/apartment Stairs: 6 step into home Has following equipment at home: Single point cane and rollator and electric scooterlift chair, shower bench  OCCUPATION: disables  PLOF: Independent uses shower benches  PATIENT GOALS: build stamina, increase strength  NEXT MD VISIT: Friday  OBJECTIVE:  Note: Objective measures were completed at Evaluation unless otherwise noted.  DIAGNOSTIC FINDINGS: none in chart  PATIENT SURVEYS:  LEFS 12/80  COGNITION: Overall cognitive status: Within functional limits for tasks assessed     SENSATION: WFL   MUSCLE LENGTH: Hamstrings:shortened bilaterally   POSTURE: rounded shoulders, forward head, and knee flexed trunk forward  flexed.  PALPATION: Moderate TTP about knees bilat joint lines medial> Lateral  LOWER EXTREMITY ROM:  Active ROM Right eval Left eval R / L 11/08/23 R/L 12/04/23 R/L 01/07/24  Hip flexion       Hip extension       Hip abduction       Hip adduction       Hip internal rotation       Hip external rotation       Knee flexion 100d 103 98 / 100 93/93 86/94  Knee extension -40 -34 -38 / -30 28deg/19deg -27/-23  Ankle dorsiflexion   Wfl / wfl 5deg/7deg   Ankle plantarflexion       Ankle inversion       Ankle eversion        (Blank rows = not tested)  LOWER EXTREMITY MMT:  HD (lb) Tested in sitting Right eval Left eval R / L 11/08/23 R/L 12/04/23   Hip flexion 44.4 45.9     Hip extension       Hip abduction 27.1 25.9     Hip adduction       Hip internal rotation       Hip external rotation       Knee flexion       Knee extension 33.1 25.6 32.4 / 35.2 48.6/48.9   Ankle dorsiflexion       Ankle plantarflexion       Ankle inversion       Ankle eversion        (Blank rows = not tested)   FUNCTIONAL TESTS:  Timed up and go (TUG): 23.91 STS transfers: heavy ue support from pool bench; forward momentum with ue elevated   12/04/23: TUG: 14.5seconds- No AD 5xSTS: 17.69 (used rocking/momentum)  GAIT: Distance walked: 100 ft Assistive device utilized: rollator Level of assistance: SBA Comments: bilateral flex contractures: no heel strike, sliding on toes when advancing LE during swing, forward flex trunk posture   11/08/23: 400 ft x 2  using rollator   Forward hip flex, knee flex forearms intermittently on handles, continues to slide toes acroos floor to advance le but is able to clear foot 1/3 of time with cues  TREATMENT  [facility] Adult PT Treatment:                                                DATE: 01/10/24 Pt seen for aquatic therapy today.   Treatment took place in water 3.5-4.75 ft in depth at the [facility] pool. Temp of water was 91.  Pt entered and exited the pool via stairs using step-to pattern with heavy UE on hand rail.  *suitcase carry with yellow hand floats and walking forward, backward * side stepping with arm add/abdct with yellow hand floats * forward walking kicks with LAQ * plank at bench in water:  flutter kick and hip abdct/ add  * alternating toe taps to 1st/2nd step without UE support * standing quad stretch with foot on 2nd step behind her x 20s each x 2 reps * seated on bench: alternating LAQ with DF, varying speed * squats pushing yellow hand float under water * 3 way LE kick with UE on yellow hand floats * alternating back lunges for hip flexor stretch * single leg clams -> plank with fire hydrants  [facility] Adult PT Treatment:                                                DATE: 01/07/24  Re-assessment/recert Self care: importance of continuing with HEP land and water; pool access; safety with amb (using rollator not wheeled chairs)  Pt seen for aquatic therapy today.  Treatment took place in water 3.5-4.75 ft in depth at the [facility] pool. Temp of water was 91.  Pt entered and exited the pool via stairs using step-to pattern with heavy UE on hand rail.  *walking forward, back and side stepping *seated on bench: flutter kicking; LAQ; hip add/abd 3 sets 20 slow/20 fast; cycling * R/L quad stretch/hamstring and gastroc stretch with foot on 2nd step behind her x 20s each x 2 reps  *forward march with kick    [facility] Adult PT Treatment:                                                DATE: 12/04/23  Updated ROM  Updated strength TUG test 5xSTS test Goal review  Seated ankle and knee AROM Ambulation x222ft (1 seated break) Nu-step x49min L4    [facility] Adult PT Treatment:                                                DATE: 11/18/23 Pt seen for aquatic therapy today.  Treatment took  place in water 3.5-4.75 ft in depth at the [facility] pool. Temp of water was 91.  Pt entered and exited the pool via stairs using step-to pattern with heavy UE on hand rail.  * Yellow hand floats under water:  walking forward/backward - multiple laps, cues for heel/toe and toe/ heel (does better facing lap pool) *side stepping yellow hand floats and add/abdct-> into wide squat  *  forward walking kick * hands on bench in water:  plank with alternating hip extension x 10-> hip abdct/add x 10 * seated on bench: alternating LAQ with DF x 10;  clams x 10 * bil heels off of step for gastroc stretch  * L stretch with UE on rails  * forward step up/retro step down x 5 reps each LE; cues to not circumduct LE onto step * R/L quad stretch with foot on 2nd step behind her x 20s each x 2 reps    PATIENT EDUCATION:  Education details: aquatic therapy progression/ modification Person educated: Patient Education method: Explanation Education comprehension: verbalized understanding  HOME EXERCISE PROGRAM: AQUATIC Access Code: [email or web address] URL: [email or web address] Date: 11/04/2023 Prepared by: [facility] This aquatic home exercise program from [employer] utilizes pictures from land based exercises, but has been adapted prior to lamination and issuance.     ASSESSMENT:  CLINICAL IMPRESSION: Pt reported some increase in bil knee pain with flexion ROM exercises in water, but good relief with prone flutter kick and fire hydrant exercise. Reviewed HEP with patient and showed her alternative exercises. Only minor cues required for form. Will plan to final instruction on HEP if able to acquire one additional visit prior to end of authorization.   Pt improved in objective measures including Tug test, ROM, and hand dyno strength measures. Knee flexion was decreased today, likely due to increased edema as pt reported forgetting to take her fluid pill  this morning. Although she has improved in knee extension ROM, she does still demonstrate a significant deficit in full range. With ambulation, she does stand too far away from the device. Discussed height of rollator with pt as she had raised it since previous land session. Will need to be lowered for ideal elbow position. Ambulated 246ft in clinic using rollator with single seated rest break halfway through. During last 76ft, she experienced some sharp pulling in L calf area. Followed up with some gentle AROM stretching which helped decrease this. Pt able to trial nu-step today at end of session with good tolerance in order to work on ROM. She will benefit from additional PT to improve functional capacity for ADLS in alignment with goals.      Eval: Patient is a 58 y.o. f who was seen today for physical therapy evaluation and treatment for bilat knee OA. She presents today with husband assisting she is using a rollator.  May require wc assistance for therapy sessions.  Exam reveals significant bilateral knee flex contractures, limited knee flex, strength deficits, balance and proprioception deficits. Her gait is greatly impeded as she is unable to strike her heels and doesn't clear her feet from the floor/shuffles. She is a high fall risk due to posture and gait deficits.  She will benefit from skilled physical therapy both aquatic and land.  Will plan on focusing initially in aquatics on pain management, movement toleration improving ROM and strengthening then progress fairly quickly to alternating with land. Goals to improve all deficits which are significantly affecting her functional mobility and ADL's  OBJECTIVE IMPAIRMENTS: Abnormal gait, decreased activity tolerance, decreased balance, decreased coordination, decreased endurance, decreased knowledge of condition, decreased knowledge of use of DME, decreased mobility, difficulty walking, decreased ROM, decreased strength, impaired flexibility,  postural dysfunction, obesity, and pain.   ACTIVITY LIMITATIONS: carrying, lifting, bending, sitting, standing, squatting, sleeping, stairs, transfers, hygiene/grooming, and locomotion level  PARTICIPATION LIMITATIONS: meal prep, cleaning, laundry, driving, shopping, community activity, occupation, and yard work  PERSONAL FACTORS: Behavior pattern, Fitness, Past/current experiences, and Time since onset of injury/illness/exacerbation are also affecting patient's functional outcome.   REHAB POTENTIAL: Good  CLINICAL DECISION MAKING: Evolving/moderate complexity  EVALUATION COMPLEXITY: Moderate   GOALS: Goals reviewed with patient? Yes  SHORT TERM GOALS: Target date: 10/11/23 Pt will tolerate full aquatic sessions consistently without increase in pain and with improving function to demonstrate good toleration and effectiveness of intervention.  Baseline: Goal status:Met 10/14/23  2.  Pt will tolerate walking to and from setting and engaging in aquatic therapy session without excessive fatigue or increase in pain to demonstrate improved toleration to activity. Baseline: unable at eval Goal status: Met - 11/04/23  3.  Pt will be indep with stair negotiation into and out of pool consistently using hand rail Baseline:  Goal status: Met 10/14/23    4. Pt to verbalize understanding of the importance of exercise/movement on overall well being.    Baseline: immobile for up towards 1 year    Goal status:Met 10/14/23   LONG TERM GOALS: Target date: 11/08/23  Pt to improve on LEFS by at least 9 point to demonstrate statistically significant Improvement in function. Baseline: 12/80; 21/80 Goal status: Met 11/08/23  2.  Pt will report decrease in pain by at least 50% for improved toleration to activity/quality of life and to demonstrate improved management of pain. Baseline:  Goal status: MET (75% 12/04/23)  3.  Pt will improve strength in knee extension by at least 10 lbs to demonstrate improved  overall physical function Baseline:  Goal status: MET 12/04/23  4.  Pt to improve bilateral knee extension by 50% for improved gait. Baseline:  Goal status: In progress 12/04/23    5. Pt will be indep with final HEP's (land and aquatic as appropriate) for continued management of condition    Baseline:    Goal status: In progress -8   6. Pt will improve bilateral ankle DF to 10d to improve heel strike with gait.   Baseline:<neutral   Goal Status: IN PROGRESS 8/27    7.Pt will improve standing posture as demonstrated by standing with rollator and walking with hands on bars at all times   Baseline: 50-75% of time leaning on forearms.   Goal Status: New   8. Pt will amb 1000 ft using ad in upright position striking heels 50% of time.   Baseline: 400-500 ft leaning on forearms at least 50% of time; heel strike  PLAN:  PT FREQUENCY: 2 x week  PT DURATION: 4 weeks  PLANNED INTERVENTIONS: 97164- PT Re-evaluation, 97110-Therapeutic exercises, 97530- Therapeutic activity, [id number]- Neuromuscular re-education, 97535- Self Care, [id number]- Manual therapy, [id number]- Gait training, [phone]- Orthotic Initial, [phone]- Aquatic Therapy, [phone]- Ionotophoresis 4mg /ml Dexamethasone , Patient/Family education, Balance training, Stair training, Taping, Dry Needling, Joint mobilization, DME instructions, Cryotherapy, and Moist heat  PLAN FOR NEXT SESSION: alternate land/aquatics for Knee ROM/strengthening; gait; balance an proprioception; Review HEP   Delon Aquas, PTA 01/10/24 4:53 PM [facility] [address] [place], [place], [address] Phone: [phone]   Fax:  [phone]  Date of referral: 08/09/23 Referring provider: Maude Graves MD Referring diagnosis? Bilat knee OA Treatment diagnosis? (if different than referring diagnosis) no  What was this (referring dx) caused by? Arthritis  Nature of Condition: Chronic (continuous duration > 3  months)   Laterality: Both  Current Functional Measure Score: LEFS 21/80  Objective measurements identify impairments when they are compared to normal values, the uninvolved extremity, and prior level of  function.  [x]  Yes  []  No  Objective assessment of functional ability: Severe functional limitations   Briefly describe symptoms: Bilat knee pain, flexion contractures, gait abnormality, strength deficits  How did symptoms start: OA  Average pain intensity:  Last 24 hours: 3/10  Past week: 3/10  How often does the pt experience symptoms? intermittent  How much have the symptoms interfered with usual daily activities? Quite a bit  How has condition changed since care began at this facility? Better  In general, how is the patients overall health? Good

## 2024-01-21 ENCOUNTER — Other Ambulatory Visit: Payer: Self-pay | Admitting: Internal Medicine

## 2024-01-21 DIAGNOSIS — E1165 Type 2 diabetes mellitus with hyperglycemia: Secondary | ICD-10-CM

## 2024-01-24 DIAGNOSIS — M170 Bilateral primary osteoarthritis of knee: Secondary | ICD-10-CM | POA: Diagnosis not present

## 2024-02-04 DIAGNOSIS — M542 Cervicalgia: Secondary | ICD-10-CM | POA: Diagnosis not present

## 2024-02-04 DIAGNOSIS — Z5181 Encounter for therapeutic drug level monitoring: Secondary | ICD-10-CM | POA: Diagnosis not present

## 2024-02-04 DIAGNOSIS — R519 Headache, unspecified: Secondary | ICD-10-CM | POA: Diagnosis not present

## 2024-02-04 DIAGNOSIS — R202 Paresthesia of skin: Secondary | ICD-10-CM | POA: Diagnosis not present

## 2024-02-04 DIAGNOSIS — G894 Chronic pain syndrome: Secondary | ICD-10-CM | POA: Diagnosis not present

## 2024-02-04 DIAGNOSIS — Z79891 Long term (current) use of opiate analgesic: Secondary | ICD-10-CM | POA: Diagnosis not present

## 2024-02-04 DIAGNOSIS — M25561 Pain in right knee: Secondary | ICD-10-CM | POA: Diagnosis not present

## 2024-02-04 DIAGNOSIS — G4733 Obstructive sleep apnea (adult) (pediatric): Secondary | ICD-10-CM | POA: Diagnosis not present

## 2024-02-04 DIAGNOSIS — M25562 Pain in left knee: Secondary | ICD-10-CM | POA: Diagnosis not present

## 2024-02-04 DIAGNOSIS — M7918 Myalgia, other site: Secondary | ICD-10-CM | POA: Diagnosis not present

## 2024-02-20 ENCOUNTER — Institutional Professional Consult (permissible substitution) (HOSPITAL_BASED_OUTPATIENT_CLINIC_OR_DEPARTMENT_OTHER): Admitting: Family

## 2024-02-24 ENCOUNTER — Ambulatory Visit: Payer: Self-pay | Admitting: Internal Medicine

## 2024-02-24 NOTE — Progress Notes (Deleted)
 I,Jameka J Llittleton, CMA,acting as a neurosurgeon for [name] LOISE Slocumb, MD.,have documented all relevant documentation on the behalf of [name] LOISE Slocumb, MD,as directed by  [name] LOISE Slocumb, MD while in the presence of [name] LOISE Slocumb, MD.  Subjective:  Patient ID: [name] Wright , female    DOB: July 19, 1965 , 58 y.o.   MRN: [id number]  No chief complaint on file.   HPI  Patient presents today for a bpc. Patient reports compliance with her meds. Patient denies having chest pain,sob or headaches at this time.   Diabetes She presents for her follow-up diabetic visit. She has type 2 diabetes mellitus. There are no hypoglycemic associated symptoms. Pertinent negatives for diabetes include no blurred vision, no polydipsia, no polyphagia and no polyuria. There are no hypoglycemic complications. Risk factors for coronary artery disease include diabetes mellitus, dyslipidemia, hypertension, sedentary lifestyle and obesity. An ACE inhibitor/angiotensin II receptor blocker is being taken. Eye exam is current.  Hypertension This is a chronic problem. The current episode started more than 1 month ago. The problem is unchanged. Pertinent negatives include no blurred vision or orthopnea. Compliance problems include exercise.      Past Medical History:  Diagnosis Date  . Acute cystitis   . Allergic rhinitis   . Childhood asthma    no problems as adult - no inhaler  . Chronic insomnia   . Chronic pain due to trauma 02/2001   closed head trauma - Followed by Dr Elspeth [facility]  . Complication of anesthesia   . Dyspepsia   . Family history of ovarian cancer   . Head trauma 02/24/2001   closed  . History of kidney stones    passed stone - no surgery required  . Hypertension   . Hypothyroidism   . PONV (postoperative nausea and vomiting)   . Pre-diabetes   . Sleep apnea    Pt denies  . SVD (spontaneous vaginal delivery)    fetal demise at 5 months     Family History   Problem Relation Age of Onset  . Multiple sclerosis Mother 24  . Bladder Cancer Mother 29  . Heart Problems Father   . Dementia Paternal Aunt   . Ovarian cancer Maternal Grandmother   . Lung cancer Maternal Grandfather   . Tuberculosis Paternal Grandmother      Current Outpatient Medications:  .  acetaminophen  (TYLENOL ) 500 MG tablet, Take 1,000 mg by mouth every 6 (six) hours as needed for moderate pain (pain score 4-6)., Disp: , Rfl:  .  albuterol  (PROAIR  HFA) 108 (90 Base) MCG/ACT inhaler, Inhale 2 puffs into the lungs every 4 (four) hours as needed for wheezing or shortness of breath., Disp: 1 each, Rfl: 1 .  baclofen (LIORESAL) 10 MG tablet, Take 10 mg by mouth 3 (three) times daily as needed for muscle spasms., Disp: , Rfl:  .  butalbital-acetaminophen -caffeine (FIORICET) 50-325-40 MG tablet, Take 1 tablet by mouth every 6 (six) hours as needed for headache., Disp: , Rfl:  .  cetirizine  (ZYRTEC  ALLERGY) 10 MG tablet, Take 1 tablet (10 mg total) by mouth daily. (Patient taking differently: Take 10 mg by mouth daily as needed for allergies.), Disp: 90 tablet, Rfl: 0 .  Continuous Glucose Sensor (FREESTYLE LIBRE 3 PLUS SENSOR) MISC, APPLY   TOPICALLY AS DIRECTED TO CHECK BLOOD SUGAR DAILY, Disp: 3 each, Rfl: 0 .  cyanocobalamin  (VITAMIN B12) 1000 MCG tablet, Take 1,000 mcg by mouth daily as needed (mouth ulcers)., Disp: ,  Rfl:  .  diclofenac (FLECTOR) 1.3 % PTCH, Place 1 patch onto the skin daily as needed (pain)., Disp: , Rfl:  .  diclofenac (VOLTAREN) 75 MG EC tablet, Take 75 mg by mouth 2 (two) times daily as needed for moderate pain (pain score 4-6)., Disp: , Rfl:  .  diclofenac Sodium (VOLTAREN) 1 % GEL, Apply 1 Application topically 4 (four) times daily as needed (pain)., Disp: , Rfl:  .  diphenhydrAMINE  (BENADRYL ) 25 mg capsule, Take 25 mg by mouth every 4 (four) hours as needed for allergies., Disp: , Rfl:  .  EPINEPHrine 0.3 mg/0.3 mL IJ SOAJ injection, Inject 0.3 mg into the  muscle See admin instructions., Disp: , Rfl:  .  fluconazole  (DIFLUCAN ) 150 MG tablet, Take 1 tab by mouth today repeat in 5 days, Disp: 2 tablet, Rfl: 0 .  fluticasone  (FLONASE) 50 MCG/ACT nasal spray, Place 1 spray into both nostrils daily as needed for allergies., Disp: , Rfl:  .  furosemide  (LASIX ) 80 MG tablet, TAKE 1 TABLET BY MOUTH ONCE DAILY AS NEEDED FOR FLUID, Disp: 90 tablet, Rfl: 0 .  ibuprofen  (ADVIL ) 200 MG tablet, Take 600 mg by mouth every 6 (six) hours as needed for moderate pain (pain score 4-6)., Disp: , Rfl:  .  levothyroxine  (SYNTHROID ) 100 MCG tablet, TAKE 1 TABLET BY MOUTH BEFORE BREAKFAST, Disp: 90 tablet, Rfl: 1 .  MAGNESIUM PO, Take 1 tablet by mouth daily as needed (cramping)., Disp: , Rfl:  .  meclizine (ANTIVERT) 25 MG tablet, Take 25 mg by mouth every 4 (four) hours as needed for dizziness (vertigo)., Disp: , Rfl:  .  methocarbamol (ROBAXIN) 500 MG tablet, Take 500 mg by mouth every 8 (eight) hours as needed for muscle spasms., Disp: , Rfl:  .  morphine  (MSIR) 15 MG tablet, Take 15 mg by mouth 3 (three) times daily as needed for severe pain (pain score 7-10)., Disp: , Rfl:  .  promethazine  (PHENERGAN ) 25 MG tablet, TAKE 1/2 TO 1 (ONE-HALF TO ONE) TABLET BY MOUTH EVERY 8 HOURS AS NEEDED FOR NAUSEA (Patient taking differently: Take 25 mg by mouth every 4 (four) hours as needed for vomiting or nausea.), Disp: 40 tablet, Rfl: 0 .  RESTASIS 0.05 % ophthalmic emulsion, Place 1 drop into both eyes at bedtime., Disp: , Rfl:  .  SUDOGEST MAXIMUM STRENGTH 30 MG tablet, TAKE 1 TABLET BY MOUTH TWICE DAILY AS NEEDED FOR CONGESTION (Patient taking differently: Take 30 mg by mouth every 4 (four) hours as needed for congestion.), Disp: 90 tablet, Rfl: 0 .  tirzepatide  (MOUNJARO ) 10 MG/0.5ML Pen, Inject 10 mg into the skin once a week., Disp: 2 mL, Rfl: 2 .  TURMERIC PO, Take 1,500 mg by mouth daily as needed (inflammation)., Disp: , Rfl:  .  Vitamin D , Ergocalciferol , (DRISDOL ) 1.25 MG  (50000 UNIT) CAPS capsule, Take 1 capsule (50,000 Units total) by mouth once a week., Disp: 5 capsule, Rfl: 0   Allergies  Allergen Reactions  . Doxycycline  Anaphylaxis, Diarrhea and Nausea And Vomiting    headache  . Empagliflozin      Other Reaction(s): Other (See Comments)  Yeast and urinary infection  . Lysteda [Tranexamic Acid] Anaphylaxis    Tongue swelling, facial numbness, hand went numb. Felt like throat was closing   . Megestrol Anaphylaxis    Tongue swelling, felt like throat was closing   . Oxycodone -Acetaminophen  Hives and Nausea And Vomiting    Can take plain tylenol   . Prochlorperazine Edisylate Other (See Comments)  Hyper and jitteriness  . Amoxicillin  Nausea And Vomiting and Other (See Comments)  . Cortisone     Flushed   . Eszopiclone Other (See Comments)    Metallic taste and became un effective   . Keppra [Levetiracetam] Other (See Comments)    hyperactive  . Latex     Condoms cause vaginal irritation  . Neurontin  [Gabapentin ] Nausea And Vomiting  . Ozempic  (0.25 Or 0.5 Mg-Dose) [Semaglutide (0.25 Or 0.5mg -Dos)] Diarrhea and Nausea And Vomiting    Bad taste in mouth   . Propoxyphene Hives  . Robitussin Dm Max Day-Night Hives  . Tape     PAPER TAPE-skin burns/severe irritation  PATIENT DOES NOT TOLERATE PAPER TAPE  . Trileptal [Oxcarbazepine] Other (See Comments)    REACTION: immune system suppressed  . Bactrim  [Sulfamethoxazole -Trimethoprim ] Rash  . Bupropion Rash  . Codeine Rash  . Moxifloxacin Swelling and Rash  . Vicodin [Hydrocodone-Acetaminophen ] Rash     Review of Systems  Constitutional: Negative.   Eyes: Negative.  Negative for blurred vision.  Cardiovascular:  Negative for orthopnea.  Endocrine: Negative for polydipsia, polyphagia and polyuria.  Musculoskeletal: Negative.   Skin: Negative.   Psychiatric/Behavioral: Negative.       There were no vitals filed for this visit. There is no height or weight on file to calculate BMI.  Wt  Readings from Last 3 Encounters:  11/20/23 (!) 346 lb 9.6 oz (157.2 kg)  09/26/23 (!) 364 lb (165.1 kg)  08/19/23 (!) 365 lb (165.6 kg)    The 10-year ASCVD risk score (Arnett DK, et al., 2019) is: 7.7%   Values used to calculate the score:     Age: 50 years     Clincally relevant sex: Female     Is Non-Hispanic African American: No     Diabetic: Yes     Tobacco smoker: No     Systolic Blood Pressure: 141 mmHg     Is BP treated: Yes     HDL Cholesterol: 49 mg/dL     Total Cholesterol: 186 mg/dL  Objective:  Physical Exam      Assessment And Plan:   Assessment & Plan Hyperlipidemia associated with type 2 diabetes mellitus (HCC)  Primary hypothyroidism  Drug therapy   No orders of the defined types were placed in this encounter.    No follow-ups on file.  Patient was given opportunity to ask questions. Patient verbalized understanding of the plan and was able to repeat key elements of the plan. All questions were answered to their satisfaction.    I, [name] LOISE Slocumb, MD, have reviewed all documentation for this visit. The documentation on 02/24/24 for the exam, diagnosis, procedures, and orders are all accurate and complete.   IF YOU HAVE BEEN REFERRED TO A SPECIALIST, IT MAY TAKE 1-2 WEEKS TO SCHEDULE/PROCESS THE REFERRAL. IF YOU HAVE NOT HEARD FROM US /SPECIALIST IN TWO WEEKS, PLEASE GIVE US  A CALL AT [phone].

## 2024-03-02 ENCOUNTER — Other Ambulatory Visit: Payer: Self-pay | Admitting: Internal Medicine

## 2024-03-02 DIAGNOSIS — E1165 Type 2 diabetes mellitus with hyperglycemia: Secondary | ICD-10-CM

## 2024-03-04 ENCOUNTER — Other Ambulatory Visit: Payer: Self-pay | Admitting: Internal Medicine

## 2024-03-24 ENCOUNTER — Ambulatory Visit: Admitting: Internal Medicine

## 2024-03-24 ENCOUNTER — Encounter: Payer: Self-pay | Admitting: Internal Medicine

## 2024-03-24 VITALS — BP 130/82 | HR 91 | Temp 98.1°F | Ht 66.0 in | Wt 361.0 lb

## 2024-03-24 DIAGNOSIS — Z1231 Encounter for screening mammogram for malignant neoplasm of breast: Secondary | ICD-10-CM

## 2024-03-24 DIAGNOSIS — E559 Vitamin D deficiency, unspecified: Secondary | ICD-10-CM | POA: Diagnosis not present

## 2024-03-24 DIAGNOSIS — Z6841 Body Mass Index (BMI) 40.0 and over, adult: Secondary | ICD-10-CM

## 2024-03-24 DIAGNOSIS — I10 Essential (primary) hypertension: Secondary | ICD-10-CM

## 2024-03-24 DIAGNOSIS — E039 Hypothyroidism, unspecified: Secondary | ICD-10-CM

## 2024-03-24 DIAGNOSIS — E785 Hyperlipidemia, unspecified: Secondary | ICD-10-CM

## 2024-03-24 DIAGNOSIS — E1169 Type 2 diabetes mellitus with other specified complication: Secondary | ICD-10-CM | POA: Diagnosis not present

## 2024-03-24 NOTE — Assessment & Plan Note (Signed)
 BMI 58. She is encouraged to lose ten percent of her body weight to decrease cardiac risk. Reminded to do chair exercises to help increase her activity level.

## 2024-03-24 NOTE — Assessment & Plan Note (Signed)
 I WILL CHECK A VIT D LEVEL AND SUPPLEMENT AS NEEDED.  ALSO ENCOURAGED TO SPEND 15 MINUTES IN THE SUN DAILY.

## 2024-03-24 NOTE — Progress Notes (Unsigned)
 I,Jameka J Llittleton, CMA,acting as a neurosurgeon for Catheryn LOISE Slocumb, MD.,have documented all relevant documentation on the behalf of Catheryn LOISE Slocumb, MD,as directed by  Catheryn LOISE Slocumb, MD while in the presence of Catheryn LOISE Slocumb, MD.  Subjective:  Patient ID: [name] Wright , female    DOB: 11/12/1965 , 58 y.o.   MRN: [id number]  Chief Complaint  Patient presents with   Diabetes    Patient presents today for a bpc. Patient reports compliance with her meds. Patient denies having chest pain,sob or headaches at this time. Patient would like for her magnesium and vitamin b12 and d rechecked.    HPI  Patient presents today for a bpc. Patient reports compliance with her meds. Patient denies having chest pain,sob or headaches at this time.   Diabetes She presents for her follow-up diabetic visit. She has type 2 diabetes mellitus. There are no hypoglycemic associated symptoms. Pertinent negatives for diabetes include no blurred vision, no polydipsia, no polyphagia and no polyuria. There are no hypoglycemic complications. Risk factors for coronary artery disease include diabetes mellitus, dyslipidemia, hypertension, sedentary lifestyle and obesity. An ACE inhibitor/angiotensin II receptor blocker is being taken. Eye exam is current.  Hypertension This is a chronic problem. The current episode started more than 1 month ago. The problem is unchanged. Pertinent negatives include no blurred vision or orthopnea. Compliance problems include exercise.      Past Medical History:  Diagnosis Date   Acute cystitis    Allergic rhinitis    Childhood asthma    no problems as adult - no inhaler   Chronic insomnia    Chronic pain due to trauma 02/2001   closed head trauma - Followed by Dr Elspeth [facility]   Complication of anesthesia    Dyspepsia    Family history of ovarian cancer    Head trauma 02/24/2001   closed   History of kidney stones    passed stone - no  surgery required   Hypertension    Hypothyroidism    PONV (postoperative nausea and vomiting)    Pre-diabetes    Sleep apnea    Pt denies   SVD (spontaneous vaginal delivery)    fetal demise at 5 months     Family History  Problem Relation Age of Onset   Multiple sclerosis Mother 42   Bladder Cancer Mother 67   Heart Problems Father    Dementia Paternal Aunt    Ovarian cancer Maternal Grandmother    Lung cancer Maternal Grandfather    Tuberculosis Paternal Grandmother      Current Outpatient Medications:    acetaminophen  (TYLENOL ) 500 MG tablet, Take 1,000 mg by mouth every 6 (six) hours as needed for moderate pain (pain score 4-6)., Disp: , Rfl:    albuterol  (PROAIR  HFA) 108 (90 Base) MCG/ACT inhaler, Inhale 2 puffs into the lungs every 4 (four) hours as needed for wheezing or shortness of breath., Disp: 1 each, Rfl: 1   baclofen (LIORESAL) 10 MG tablet, Take 10 mg by mouth 3 (three) times daily as needed for muscle spasms., Disp: , Rfl:    butalbital-acetaminophen -caffeine (FIORICET) 50-325-40 MG tablet, Take 1 tablet by mouth every 6 (six) hours as needed for headache., Disp: , Rfl:    cetirizine  (ZYRTEC  ALLERGY) 10 MG tablet, Take 1 tablet (10 mg total) by mouth daily. (Patient taking differently: Take 10 mg by mouth daily as needed for allergies.), Disp: 90 tablet, Rfl: 0   Continuous Glucose Sensor (  FREESTYLE LIBRE 3 PLUS SENSOR) MISC, APPLY TOPICALLY AS DIRECTED TO CHECK BLOOD SUGAR DAILY. CHANGE SENSOR ONCE EVERY 15 DAYS., Disp: 3 each, Rfl: 0   cyanocobalamin  (VITAMIN B12) 1000 MCG tablet, Take 1,000 mcg by mouth daily as needed (mouth ulcers)., Disp: , Rfl:    diclofenac (FLECTOR) 1.3 % PTCH, Place 1 patch onto the skin daily as needed (pain)., Disp: , Rfl:    diclofenac (VOLTAREN) 75 MG EC tablet, Take 75 mg by mouth 2 (two) times daily as needed for moderate pain (pain score 4-6)., Disp: , Rfl:    diclofenac Sodium (VOLTAREN) 1 % GEL, Apply 1  Application topically 4 (four) times daily as needed (pain)., Disp: , Rfl:    diphenhydrAMINE  (BENADRYL ) 25 mg capsule, Take 25 mg by mouth every 4 (four) hours as needed for allergies., Disp: , Rfl:    EPINEPHrine 0.3 mg/0.3 mL IJ SOAJ injection, Inject 0.3 mg into the muscle See admin instructions., Disp: , Rfl:    fluticasone  (FLONASE) 50 MCG/ACT nasal spray, Place 1 spray into both nostrils daily as needed for allergies., Disp: , Rfl:    furosemide  (LASIX ) 80 MG tablet, TAKE 1 TABLET BY MOUTH ONCE DAILY AS NEEDED FOR FLUID, Disp: 90 tablet, Rfl: 0   ibuprofen  (ADVIL ) 200 MG tablet, Take 600 mg by mouth every 6 (six) hours as needed for moderate pain (pain score 4-6)., Disp: , Rfl:    levothyroxine  (SYNTHROID ) 100 MCG tablet, TAKE 1 TABLET BY MOUTH BEFORE BREAKFAST, Disp: 90 tablet, Rfl: 1   MAGNESIUM PO, Take 1 tablet by mouth daily as needed (cramping)., Disp: , Rfl:    meclizine (ANTIVERT) 25 MG tablet, Take 25 mg by mouth every 4 (four) hours as needed for dizziness (vertigo)., Disp: , Rfl:    methocarbamol (ROBAXIN) 500 MG tablet, Take 500 mg by mouth every 8 (eight) hours as needed for muscle spasms., Disp: , Rfl:    morphine  (MSIR) 15 MG tablet, Take 15 mg by mouth 3 (three) times daily as needed for severe pain (pain score 7-10)., Disp: , Rfl:    MOUNJARO  10 MG/0.5ML Pen, INJECT 10MG  INTO THE SKIN ONCE A WEEK, Disp: 4 mL, Rfl: 0   promethazine  (PHENERGAN ) 25 MG tablet, TAKE 1/2 TO 1 (ONE-HALF TO ONE) TABLET BY MOUTH EVERY 8 HOURS AS NEEDED FOR NAUSEA (Patient taking differently: Take 25 mg by mouth every 4 (four) hours as needed for vomiting or nausea.), Disp: 40 tablet, Rfl: 0   RESTASIS 0.05 % ophthalmic emulsion, Place 1 drop into both eyes at bedtime., Disp: , Rfl:    SUDOGEST MAXIMUM STRENGTH 30 MG tablet, TAKE 1 TABLET BY MOUTH TWICE DAILY AS NEEDED FOR CONGESTION (Patient taking differently: Take 30 mg by mouth every 4 (four) hours as needed for congestion.), Disp: 90  tablet, Rfl: 0   TURMERIC PO, Take 1,500 mg by mouth daily as needed (inflammation)., Disp: , Rfl:    UNABLE TO FIND, Med Name: Tumeric/Berberine liquid once daily, Disp: , Rfl:    Vitamin D , Ergocalciferol , (DRISDOL ) 1.25 MG (50000 UNIT) CAPS capsule, Take 1 capsule (50,000 Units total) by mouth once a week., Disp: 5 capsule, Rfl: 0   fluconazole  (DIFLUCAN ) 150 MG tablet, Take 1 tab by mouth today repeat in 5 days (Patient not taking: Reported on 03/24/2024), Disp: 2 tablet, Rfl: 0   Allergies  Allergen Reactions   Doxycycline  Anaphylaxis, Diarrhea and Nausea And Vomiting    headache   Empagliflozin      Other Reaction(s): Other (See  Comments)  Yeast and urinary infection   Lysteda [Tranexamic Acid] Anaphylaxis    Tongue swelling, facial numbness, hand went numb. Felt like throat was closing    Megestrol Anaphylaxis    Tongue swelling, felt like throat was closing    Oxycodone -Acetaminophen  Hives and Nausea And Vomiting    Can take plain tylenol    Prochlorperazine Edisylate Other (See Comments)    Hyper and jitteriness   Amoxicillin  Nausea And Vomiting and Other (See Comments)   Cortisone     Flushed    Eszopiclone Other (See Comments)    Metallic taste and became un effective    Keppra [Levetiracetam] Other (See Comments)    hyperactive   Latex     Condoms cause vaginal irritation   Neurontin  [Gabapentin ] Nausea And Vomiting   Ozempic  (0.25 Or 0.5 Mg-Dose) [Semaglutide (0.25 Or 0.5mg -Dos)] Diarrhea and Nausea And Vomiting    Bad taste in mouth    Propoxyphene Hives   Robitussin Dm Max Day-Night Hives   Tape     PAPER TAPE-skin burns/severe irritation  PATIENT DOES NOT TOLERATE PAPER TAPE   Trileptal [Oxcarbazepine] Other (See Comments)    REACTION: immune system suppressed   Bactrim  [Sulfamethoxazole -Trimethoprim ] Rash   Bupropion Rash   Codeine Rash   Moxifloxacin Swelling and Rash   Vicodin [Hydrocodone-Acetaminophen ] Rash     Review of  Systems  Constitutional: Negative.   Eyes: Negative.  Negative for blurred vision.  Respiratory: Negative.    Cardiovascular: Negative.  Negative for orthopnea.  Gastrointestinal: Negative.   Endocrine: Negative for polydipsia, polyphagia and polyuria.  Musculoskeletal: Negative.   Skin: Negative.   Psychiatric/Behavioral: Negative.       Today's Vitals   03/24/24 1557  BP: 130/82  Pulse: 91  Temp: 98.1 F (36.7 C)  TempSrc: Oral  Weight: (!) 361 lb (163.7 kg)  Height: 5' 6 (1.676 m)  PainSc: 0-No pain   Body mass index is 58.27 kg/m.  Wt Readings from Last 3 Encounters:  03/24/24 (!) 361 lb (163.7 kg)  11/20/23 (!) 346 lb 9.6 oz (157.2 kg)  09/26/23 (!) 364 lb (165.1 kg)    The 10-year ASCVD risk score (Arnett DK, et al., 2019) is: 6.6%   Values used to calculate the score:     Age: 69 years     Clinically relevant sex: Female     Is Non-Hispanic African American: No     Diabetic: Yes     Tobacco smoker: No     Systolic Blood Pressure: 130 mmHg     Is BP treated: Yes     HDL Cholesterol: 49 mg/dL     Total Cholesterol: 186 mg/dL  Objective:  Physical Exam Vitals and nursing note reviewed.  Constitutional:      Appearance: Normal appearance. She is obese.  HENT:     Head: Normocephalic and atraumatic.  Eyes:     Extraocular Movements: Extraocular movements intact.  Cardiovascular:     Rate and Rhythm: Normal rate and regular rhythm.     Heart sounds: Normal heart sounds.  Pulmonary:     Effort: Pulmonary effort is normal.     Breath sounds: Normal breath sounds.  Musculoskeletal:     Cervical back: Normal range of motion.     Right lower leg: Edema present.     Left lower leg: Edema present.     Comments: Ambulatory with walker  Skin:    General: Skin is warm.  Neurological:     General: No focal deficit present.  Mental Status: She is alert.  Psychiatric:        Mood and Affect: Mood normal.        Behavior: Behavior normal.          Assessment And Plan:   Assessment & Plan Hyperlipidemia associated with type 2 diabetes mellitus (HCC)  Essential hypertension, benign  Primary hypothyroidism  Vitamin D  deficiency I WILL CHECK A VIT D LEVEL AND SUPPLEMENT AS NEEDED.  ALSO ENCOURAGED TO SPEND 15 MINUTES IN THE SUN DAILY.  Morbid obesity with BMI of 50.0-59.9, adult (HCC) BMI 58. She is encouraged to lose ten percent of her body weight to decrease cardiac risk. Reminded to do chair exercises to help increase her activity level.  Encounter for screening mammogram for malignant neoplasm of breast Will send her to [facility].   No orders of the defined types were placed in this encounter.    Return if symptoms worsen or fail to improve.  Patient was given opportunity to ask questions. Patient verbalized understanding of the plan and was able to repeat key elements of the plan. All questions were answered to their satisfaction.    I, Catheryn LOISE Slocumb, MD, have reviewed all documentation for this visit. The documentation on 03/24/2024 for the exam, diagnosis, procedures, and orders are all accurate and complete.   IF YOU HAVE BEEN REFERRED TO A SPECIALIST, IT MAY TAKE 1-2 WEEKS TO SCHEDULE/PROCESS THE REFERRAL. IF YOU HAVE NOT HEARD FROM US /SPECIALIST IN TWO WEEKS, PLEASE GIVE US  A CALL AT [phone].

## 2024-03-24 NOTE — Patient Instructions (Signed)

## 2024-03-25 LAB — CMP14+EGFR
ALT: 26 IU/L (ref 0–32)
AST: 23 IU/L (ref 0–40)
Albumin: 4 g/dL (ref 3.8–4.9)
Alkaline Phosphatase: 112 IU/L (ref 49–135)
BUN/Creatinine Ratio: 21 (ref 9–23)
BUN: 18 mg/dL (ref 6–24)
Bilirubin Total: 0.2 mg/dL (ref 0.0–1.2)
CO2: 26 mmol/L (ref 20–29)
Calcium: 9.3 mg/dL (ref 8.7–10.2)
Chloride: 101 mmol/L (ref 96–106)
Creatinine, Ser: 0.85 mg/dL (ref 0.57–1.00)
Globulin, Total: 3.2 g/dL (ref 1.5–4.5)
Glucose: 117 mg/dL — ABNORMAL HIGH (ref 70–99)
Potassium: 4.4 mmol/L (ref 3.5–5.2)
Sodium: 139 mmol/L (ref 134–144)
Total Protein: 7.2 g/dL (ref 6.0–8.5)
eGFR: 80 mL/min/1.73 (ref >59)

## 2024-03-25 LAB — MICROALBUMIN / CREATININE URINE RATIO
Creatinine, Urine: 275.1 mg/dL
Microalb/Creat Ratio: 7 mg/g{creat} (ref 0–29)
Microalbumin, Urine: 18.9 ug/mL

## 2024-03-25 LAB — HEMOGLOBIN A1C
Est. average glucose Bld gHb Est-mCnc: 134 mg/dL
Hgb A1c MFr Bld: 6.3 % — ABNORMAL HIGH (ref 4.8–5.6)

## 2024-03-25 LAB — TSH+FREE T4
Free T4: 1.33 ng/dL (ref 0.82–1.77)
TSH: 1.32 u[IU]/mL (ref 0.450–4.500)

## 2024-03-25 LAB — VITAMIN D 25 HYDROXY (VIT D DEFICIENCY, FRACTURES): Vit D, 25-Hydroxy: 46.1 ng/mL (ref 30.0–100.0)

## 2024-03-29 NOTE — Assessment & Plan Note (Signed)
 Chronic, fair control. Goal BP<130/80. Previously intolerant of both losartan  and valsartan  - Continue furosemide  dosing Monday, Wednesday, and Friday.

## 2024-03-29 NOTE — Assessment & Plan Note (Signed)
 Primary hypothyroidism managed with Synthroid  100 mcg daily. Thyroid  function needs re-evaluation. - Checked thyroid  function. - Ensure Synthroid  prescription is refilled.

## 2024-03-29 NOTE — Assessment & Plan Note (Signed)
 Chronic, LDL goal is less than 70.  Type 2 diabetes with complications, including recent weight gain. Blood sugar levels well-controlled on Mounjaro  10 mg. Stress and movement limitations may contribute to weight gain. - Continue Mounjaro  10 mg, consider increasing the dose in the future. - Encouraged chair exercises for upper body movement.

## 2024-04-01 ENCOUNTER — Ambulatory Visit: Payer: Self-pay | Admitting: Internal Medicine

## 2024-04-14 ENCOUNTER — Inpatient Hospital Stay (HOSPITAL_BASED_OUTPATIENT_CLINIC_OR_DEPARTMENT_OTHER): Admission: RE | Admit: 2024-04-14 | Source: Ambulatory Visit

## 2024-04-30 ENCOUNTER — Encounter (HOSPITAL_BASED_OUTPATIENT_CLINIC_OR_DEPARTMENT_OTHER): Payer: Self-pay

## 2024-04-30 ENCOUNTER — Other Ambulatory Visit: Payer: Self-pay | Admitting: Internal Medicine

## 2024-04-30 DIAGNOSIS — E1165 Type 2 diabetes mellitus with hyperglycemia: Secondary | ICD-10-CM

## 2024-05-04 ENCOUNTER — Institutional Professional Consult (permissible substitution) (HOSPITAL_BASED_OUTPATIENT_CLINIC_OR_DEPARTMENT_OTHER): Admitting: Family

## 2024-05-21 ENCOUNTER — Encounter: Payer: Self-pay | Admitting: Internal Medicine

## 2024-06-18 ENCOUNTER — Institutional Professional Consult (permissible substitution) (HOSPITAL_BASED_OUTPATIENT_CLINIC_OR_DEPARTMENT_OTHER): Admitting: Family
